# Patient Record
Sex: Male | Born: 1940
Health system: Southern US, Community
[De-identification: ages and names within clinical notes are randomized; demographics above are authoritative.]

## PROBLEM LIST (undated history)

## (undated) DIAGNOSIS — I499 Cardiac arrhythmia, unspecified: Secondary | ICD-10-CM

## (undated) DIAGNOSIS — I219 Acute myocardial infarction, unspecified: Secondary | ICD-10-CM

## (undated) DIAGNOSIS — N281 Cyst of kidney, acquired: Secondary | ICD-10-CM

## (undated) DIAGNOSIS — M1990 Unspecified osteoarthritis, unspecified site: Secondary | ICD-10-CM

## (undated) DIAGNOSIS — I4891 Unspecified atrial fibrillation: Secondary | ICD-10-CM

## (undated) DIAGNOSIS — J189 Pneumonia, unspecified organism: Secondary | ICD-10-CM

## (undated) DIAGNOSIS — M4800 Spinal stenosis, site unspecified: Secondary | ICD-10-CM

## (undated) DIAGNOSIS — I2510 Atherosclerotic heart disease of native coronary artery without angina pectoris: Secondary | ICD-10-CM

## (undated) DIAGNOSIS — N451 Epididymitis: Secondary | ICD-10-CM

## (undated) DIAGNOSIS — T508X5A Adverse effect of diagnostic agents, initial encounter: Secondary | ICD-10-CM

## (undated) DIAGNOSIS — Z9289 Personal history of other medical treatment: Secondary | ICD-10-CM

## (undated) DIAGNOSIS — C689 Malignant neoplasm of urinary organ, unspecified: Secondary | ICD-10-CM

## (undated) DIAGNOSIS — N141 Nephropathy induced by other drugs, medicaments and biological substances: Secondary | ICD-10-CM

## (undated) DIAGNOSIS — N1411 Contrast-induced nephropathy: Secondary | ICD-10-CM

## (undated) DIAGNOSIS — E785 Hyperlipidemia, unspecified: Secondary | ICD-10-CM

## (undated) HISTORY — DX: Nephropathy induced by other drugs, medicaments and biological substances: N14.1

## (undated) HISTORY — PX: JOINT REPLACEMENT: SHX530

## (undated) HISTORY — DX: Spinal stenosis, site unspecified: M48.00

## (undated) HISTORY — DX: Atherosclerotic heart disease of native coronary artery without angina pectoris: I25.10

## (undated) HISTORY — PX: OTHER SURGICAL HISTORY: SHX169

## (undated) HISTORY — DX: Contrast-induced nephropathy: N14.11

## (undated) HISTORY — DX: Adverse effect of diagnostic agents, initial encounter: T50.8X5A

## (undated) HISTORY — PX: URETER SURGERY: SHX823

## (undated) HISTORY — DX: Malignant neoplasm of urinary organ, unspecified: C68.9

## (undated) HISTORY — DX: Unspecified atrial fibrillation: I48.91

## (undated) HISTORY — DX: Hyperlipidemia, unspecified: E78.5

## (undated) HISTORY — DX: Cyst of kidney, acquired: N28.1

---

## 2006-11-15 DIAGNOSIS — I219 Acute myocardial infarction, unspecified: Secondary | ICD-10-CM

## 2006-11-15 HISTORY — DX: Acute myocardial infarction, unspecified: I21.9

## 2006-11-15 HISTORY — PX: CARDIAC CATHETERIZATION: SHX172

## 2006-11-28 DIAGNOSIS — M5430 Sciatica, unspecified side: Secondary | ICD-10-CM | POA: Insufficient documentation

## 2006-12-05 DIAGNOSIS — I10 Essential (primary) hypertension: Secondary | ICD-10-CM | POA: Insufficient documentation

## 2006-12-05 DIAGNOSIS — Z125 Encounter for screening for malignant neoplasm of prostate: Secondary | ICD-10-CM | POA: Insufficient documentation

## 2007-03-02 ENCOUNTER — Ambulatory Visit: Payer: Self-pay | Admitting: Internal Medicine

## 2007-03-02 ENCOUNTER — Ambulatory Visit: Payer: Self-pay | Admitting: Cardiology

## 2007-03-02 ENCOUNTER — Inpatient Hospital Stay (HOSPITAL_COMMUNITY): Admission: EM | Admit: 2007-03-02 | Discharge: 2007-03-17 | Payer: Self-pay | Admitting: Emergency Medicine

## 2007-03-02 DIAGNOSIS — I2119 ST elevation (STEMI) myocardial infarction involving other coronary artery of inferior wall: Secondary | ICD-10-CM

## 2007-03-02 HISTORY — DX: ST elevation (STEMI) myocardial infarction involving other coronary artery of inferior wall: I21.19

## 2007-03-03 ENCOUNTER — Encounter: Payer: Self-pay | Admitting: Cardiology

## 2007-03-05 ENCOUNTER — Encounter (INDEPENDENT_AMBULATORY_CARE_PROVIDER_SITE_OTHER): Payer: Self-pay | Admitting: Specialist

## 2007-03-23 ENCOUNTER — Ambulatory Visit: Payer: Self-pay | Admitting: Cardiovascular Disease

## 2007-03-27 ENCOUNTER — Ambulatory Visit: Payer: Self-pay | Admitting: Internal Medicine

## 2007-04-03 ENCOUNTER — Ambulatory Visit: Payer: Self-pay | Admitting: Cardiology

## 2007-04-11 ENCOUNTER — Ambulatory Visit: Payer: Self-pay | Admitting: Cardiovascular Disease

## 2007-04-11 LAB — CONVERTED CEMR LAB
CO2: 27 meq/L (ref 19–32)
Chloride: 107 meq/L (ref 96–112)
Creatinine, Ser: 1.7 mg/dL — ABNORMAL HIGH (ref 0.4–1.5)
Glucose, Bld: 122 mg/dL — ABNORMAL HIGH (ref 70–99)
Sodium: 144 meq/L (ref 135–145)

## 2007-04-12 ENCOUNTER — Ambulatory Visit: Payer: Self-pay | Admitting: Cardiology

## 2007-04-13 ENCOUNTER — Ambulatory Visit: Payer: Self-pay | Admitting: Cardiology

## 2007-04-19 ENCOUNTER — Ambulatory Visit: Payer: Self-pay | Admitting: Cardiology

## 2007-04-19 LAB — CONVERTED CEMR LAB
BUN: 23 mg/dL (ref 6–23)
CO2: 30 meq/L (ref 19–32)
Calcium: 9.3 mg/dL (ref 8.4–10.5)
Creatinine, Ser: 1.4 mg/dL (ref 0.4–1.5)
GFR calc non Af Amer: 54 mL/min

## 2007-05-02 ENCOUNTER — Ambulatory Visit: Payer: Self-pay | Admitting: Cardiovascular Disease

## 2007-05-30 ENCOUNTER — Ambulatory Visit: Payer: Self-pay

## 2007-05-30 ENCOUNTER — Encounter: Payer: Self-pay | Admitting: Cardiovascular Disease

## 2007-05-30 ENCOUNTER — Ambulatory Visit: Payer: Self-pay | Admitting: Cardiovascular Disease

## 2007-05-30 LAB — CONVERTED CEMR LAB
ALT: 25 units/L (ref 0–53)
Bilirubin, Direct: 0.1 mg/dL (ref 0.0–0.3)
Cholesterol: 107 mg/dL (ref 0–200)
HDL: 24.1 mg/dL — ABNORMAL LOW (ref >39.0)
LDL Cholesterol: 51 mg/dL (ref 0–99)
Total Bilirubin: 0.9 mg/dL (ref 0.3–1.2)
Total CHOL/HDL Ratio: 4.4
Total Protein: 7 g/dL (ref 6.0–8.3)

## 2007-08-02 ENCOUNTER — Ambulatory Visit: Payer: Self-pay | Admitting: Cardiovascular Disease

## 2007-11-20 ENCOUNTER — Ambulatory Visit: Payer: Self-pay | Admitting: Cardiovascular Disease

## 2007-11-21 DIAGNOSIS — N400 Enlarged prostate without lower urinary tract symptoms: Secondary | ICD-10-CM | POA: Insufficient documentation

## 2007-11-21 DIAGNOSIS — I2510 Atherosclerotic heart disease of native coronary artery without angina pectoris: Secondary | ICD-10-CM | POA: Insufficient documentation

## 2008-02-27 ENCOUNTER — Ambulatory Visit: Payer: Self-pay | Admitting: Cardiovascular Disease

## 2008-02-27 LAB — CONVERTED CEMR LAB
Chloride: 108 meq/L (ref 96–112)
Creatinine, Ser: 1.3 mg/dL (ref 0.4–1.5)
GFR calc Af Amer: 71 mL/min
GFR calc non Af Amer: 58 mL/min

## 2008-06-03 ENCOUNTER — Ambulatory Visit: Payer: Self-pay | Admitting: Cardiovascular Disease

## 2008-06-05 ENCOUNTER — Ambulatory Visit: Payer: Self-pay | Admitting: Cardiovascular Disease

## 2008-06-05 LAB — CONVERTED CEMR LAB
ALT: 16 units/L (ref 0–53)
AST: 17 units/L (ref 0–37)
Albumin: 4 g/dL (ref 3.5–5.2)
Alkaline Phosphatase: 39 units/L (ref 39–117)
BUN: 24 mg/dL — ABNORMAL HIGH (ref 6–23)
Bilirubin, Direct: 0.1 mg/dL (ref 0.0–0.3)
CO2: 29 meq/L (ref 19–32)
Chloride: 108 meq/L (ref 96–112)
Cholesterol: 119 mg/dL (ref 0–200)
GFR calc Af Amer: 71 mL/min
Glucose, Bld: 112 mg/dL — ABNORMAL HIGH (ref 70–99)
Potassium: 4.1 meq/L (ref 3.5–5.1)
Sodium: 143 meq/L (ref 135–145)
Total Protein: 6.9 g/dL (ref 6.0–8.3)
VLDL: 23 mg/dL (ref 0–40)

## 2008-07-15 ENCOUNTER — Encounter (INDEPENDENT_AMBULATORY_CARE_PROVIDER_SITE_OTHER): Payer: Self-pay | Admitting: Urology

## 2008-07-15 ENCOUNTER — Ambulatory Visit (HOSPITAL_COMMUNITY): Admission: RE | Admit: 2008-07-15 | Discharge: 2008-07-15 | Payer: Self-pay | Admitting: Urology

## 2008-07-15 HISTORY — PX: CYSTECTOMY: SUR359

## 2008-07-17 ENCOUNTER — Encounter: Admission: RE | Admit: 2008-07-17 | Discharge: 2008-07-17 | Payer: Self-pay | Admitting: Interventional Radiology

## 2008-10-05 DIAGNOSIS — E782 Mixed hyperlipidemia: Secondary | ICD-10-CM

## 2008-10-05 DIAGNOSIS — I495 Sick sinus syndrome: Secondary | ICD-10-CM | POA: Insufficient documentation

## 2008-10-05 DIAGNOSIS — I4891 Unspecified atrial fibrillation: Secondary | ICD-10-CM | POA: Insufficient documentation

## 2008-10-05 DIAGNOSIS — N183 Chronic kidney disease, stage 3 unspecified: Secondary | ICD-10-CM | POA: Insufficient documentation

## 2008-10-05 DIAGNOSIS — I2510 Atherosclerotic heart disease of native coronary artery without angina pectoris: Secondary | ICD-10-CM | POA: Insufficient documentation

## 2008-12-05 ENCOUNTER — Ambulatory Visit: Payer: Self-pay

## 2008-12-12 ENCOUNTER — Encounter: Payer: Self-pay | Admitting: Cardiovascular Disease

## 2008-12-12 ENCOUNTER — Ambulatory Visit: Payer: Self-pay | Admitting: Cardiovascular Disease

## 2009-03-20 ENCOUNTER — Encounter: Payer: Self-pay | Admitting: Cardiovascular Disease

## 2009-07-18 ENCOUNTER — Ambulatory Visit: Payer: Self-pay | Admitting: Cardiovascular Disease

## 2009-07-22 ENCOUNTER — Ambulatory Visit: Payer: Self-pay | Admitting: Cardiovascular Disease

## 2009-07-24 ENCOUNTER — Telehealth (INDEPENDENT_AMBULATORY_CARE_PROVIDER_SITE_OTHER): Payer: Self-pay | Admitting: *Deleted

## 2009-07-24 LAB — CONVERTED CEMR LAB
ALT: 21 units/L (ref 0–53)
AST: 21 units/L (ref 0–37)
Alkaline Phosphatase: 45 units/L (ref 39–117)
Bilirubin, Direct: 0.1 mg/dL (ref 0.0–0.3)
Total Bilirubin: 0.9 mg/dL (ref 0.3–1.2)
Total CHOL/HDL Ratio: 4

## 2009-08-18 ENCOUNTER — Encounter (INDEPENDENT_AMBULATORY_CARE_PROVIDER_SITE_OTHER): Payer: Self-pay

## 2009-08-18 ENCOUNTER — Telehealth: Payer: Self-pay | Admitting: Cardiovascular Disease

## 2010-01-27 ENCOUNTER — Ambulatory Visit: Payer: Self-pay | Admitting: Cardiovascular Disease

## 2010-01-28 LAB — CONVERTED CEMR LAB
ALT: 20 U/L (ref 0–53)
AST: 20 U/L (ref 0–37)
Albumin: 4.2 g/dL (ref 3.5–5.2)
Alkaline Phosphatase: 54 U/L (ref 39–117)
BUN: 17 mg/dL (ref 6–23)
Bilirubin, Direct: 0.1 mg/dL (ref 0.0–0.3)
CO2: 31 meq/L (ref 19–32)
Calcium: 8.9 mg/dL (ref 8.4–10.5)
Chloride: 106 meq/L (ref 96–112)
Cholesterol: 109 mg/dL (ref 0–200)
Creatinine, Ser: 1.4 mg/dL (ref 0.4–1.5)
GFR calc non Af Amer: 53.19 mL/min (ref >60)
Glucose, Bld: 95 mg/dL (ref 70–99)
HDL: 37.6 mg/dL — ABNORMAL LOW (ref >39.00)
LDL Cholesterol: 48 mg/dL (ref 0–99)
Potassium: 4.2 meq/L (ref 3.5–5.1)
Sodium: 142 meq/L (ref 135–145)
Total Bilirubin: 0.9 mg/dL (ref 0.3–1.2)
Total CHOL/HDL Ratio: 3
Total Protein: 7.3 g/dL (ref 6.0–8.3)
Triglycerides: 115 mg/dL (ref 0.0–149.0)
VLDL: 23 mg/dL (ref 0.0–40.0)

## 2010-02-11 ENCOUNTER — Ambulatory Visit: Payer: Self-pay | Admitting: Internal Medicine

## 2010-02-11 ENCOUNTER — Ambulatory Visit: Payer: Self-pay

## 2010-02-11 ENCOUNTER — Ambulatory Visit (HOSPITAL_COMMUNITY): Admission: RE | Admit: 2010-02-11 | Discharge: 2010-02-11 | Payer: Self-pay | Admitting: Cardiovascular Disease

## 2010-02-11 ENCOUNTER — Encounter: Payer: Self-pay | Admitting: Cardiovascular Disease

## 2010-08-20 ENCOUNTER — Ambulatory Visit: Payer: Self-pay | Admitting: Cardiovascular Disease

## 2010-08-21 ENCOUNTER — Telehealth (INDEPENDENT_AMBULATORY_CARE_PROVIDER_SITE_OTHER): Payer: Self-pay

## 2010-12-06 ENCOUNTER — Encounter: Payer: Self-pay | Admitting: Urology

## 2010-12-15 NOTE — Progress Notes (Signed)
Summary: has decided not to take pradaxa  Phone Note Call from Patient   Caller: Patient Summary of Call:  pt got an rx for pradaxa yesterday, has deceided not to take it, the bottle will be $75 a month Initial call taken by: Glynda Jaeger,  August 21, 2010 3:15 PM  Follow-up for Phone Call        Pt found out of pocket expense would be $73.00/month.  He states he also did not like the side effect possibilities.  He continues to take Aspirin 325mg  once daily. I will forward to Dr. Excell Seltzer & Leotis Shames. Mylo Red RN

## 2010-12-15 NOTE — Assessment & Plan Note (Signed)
Summary: ROV   Visit Type:  Follow-up Primary Provider:  Herb Grays  CC:  No complaints.  History of Present Illness: the patient is a 70 year old gentleman with coronary artery disease and chronic atrial fibrillation. he underwent PCI in 2008 with drug-eluting stents in the LAD when he presented with an acute infarction. He has been asymptomatic from a cardiac standpoint. The patient denies chest pain, dyspnea, orthopnea, PND, edema, palpitations, lightheadedness, or syncope.  He continues to exercise regularly and feels well. He has no exertional symptoms. He has lost a great deal of weight since I have been following and he follows a strict diet.     Current Medications (verified): 1)  Carvedilol 6.25 Mg Tabs (Carvedilol) .... Take One Tablet By Mouth Twice A Day 2)  Simvastatin 40 Mg Tabs (Simvastatin) .... Take One Tablet By Mouth Daily At Bedtime 3)  Furosemide 40 Mg Tabs (Furosemide) .... Take One Tablet By Mouth Daily 4)  Calcitriol 0.25 Mcg Caps (Calcitriol) .... Take 1 Capsule By Mouth Once A Day 5)  Nitroglycerin 0.4 Mg Subl (Nitroglycerin) .... One Tablet Under Tongue Every 5 Minutes As Needed For Chest Pain---May Repeat Times Three 6)  Centrum Silver  Tabs (Multiple Vitamins-Minerals) .... Take 1 Tablet By Mouth Once A Day 7)  Aspirin Ec 325 Mg Tbec (Aspirin) .... Take One Tablet By Mouth Daily  Allergies (verified): No Known Drug Allergies  Past History:  Past medical history reviewed for relevance to current acute and chronic problems.  Past Medical History: Reviewed history from 10/05/2008 and no changes required. 1. CAD s/p NSTEMI 04/08 2. s/p LAD PCI, drug-eluting stent 04/08 3. CKD, stage II. Hx ARF secondary to contrast nephropathy 4. Large renal cyst 5. Chronic atrial fibrillation 6. Dyslipidemia  Review of Systems       Negative except as per HPI   Vital Signs:  Patient profile:   70 year old male Height:      72 inches Weight:      199  pounds BMI:     27.09 Pulse rate:   46 / minute Pulse rhythm:   regular Resp:     18 per minute BP sitting:   106 / 63  (left arm) Cuff size:   large  Vitals Entered By: Vikki Ports (August 20, 2010 10:06 AM)  Physical Exam  General:  Pt is alert and oriented, in no acute distress.   HEENT: normal Neck: normal carotid upstrokes without bruits, JVP normal Lungs: CTA CV: irregularly irregular without murmur or gallop, bradycardic Abd: soft, NT, positive BS, no bruit, no organomegaly Ext: no clubbing, cyanosis, or edema. peripheral pulses 2+ and equal Skin: warm and dry without rash    EKG  Procedure date:  08/20/2010  Findings:      Atrial fib HR 46 bpm, otherwise within normal limits  Echocardiogram  Procedure date:  02/11/2010  Findings:      Study Conclusions            - Left ventricle: Wall thickness was increased in a pattern of mild       LVH. Systolic function was normal. The estimated ejection fraction       was in the range of 55% to 60%. Wall motion was normal; there were       no regional wall motion abnormalities.     - Aortic valve: Mild regurgitation.     - Mitral valve: Mild regurgitation.     - Left atrium: The atrium was severely  dilated.     - Right atrium: The atrium was severely dilated.  Impression & Recommendations:  Problem # 1:  ATRIAL FIBRILLATION (ICD-427.31) Pt is doing very well and has made remarkable strides with respect to weight loss and fitness. His HR is low today and while he is asymptomatic, I have recommended reducing his carvedilol to 3.125 mg twice daily. I reviewed his echo findings from this Spring, and he does have severe biatrial enlargement. While his CHADS score is low, I think his thromboembolic risk is significant with the presence of CAD, age over 88, and severe atrial enlargement. He has not responded well to warfarin in the past with very difficult to control INR's. We discussed the advantages of Pradaxa and he is  willing to try this...as long as the cost is reasonable.  His updated medication list for this problem includes:    Carvedilol 3.125 Mg Tabs (Carvedilol) .Marland Kitchen... Take one tablet by mouth twice a day    Aspirin Ec 325 Mg Tbec (Aspirin) .Marland Kitchen... Take one tablet by mouth daily  Problem # 2:  Hosp for CAD, NATIVE VESSEL (ICD-414.01) Stable without angina. He will reduce ASA to 81 mg if he decides to strrt Pradaxa.  His updated medication list for this problem includes:    Carvedilol 3.125 Mg Tabs (Carvedilol) .Marland Kitchen... Take one tablet by mouth twice a day    Nitroglycerin 0.4 Mg Subl (Nitroglycerin) ..... One tablet under tongue every 5 minutes as needed for chest pain---may repeat times three    Aspirin Ec 325 Mg Tbec (Aspirin) .Marland Kitchen... Take one tablet by mouth daily  Problem # 3:  MIXED HYPERLIPIDEMIA (ICD-272.2) Lipids at goal.  His updated medication list for this problem includes:    Simvastatin 40 Mg Tabs (Simvastatin) .Marland Kitchen... Take one tablet by mouth daily at bedtime  CHOL: 109 (01/27/2010)   LDL: 48 (01/27/2010)   HDL: 37.60 (01/27/2010)   TG: 115.0 (01/27/2010)  Patient Instructions: 1)  Your physician has recommended you make the following change in your medication: DECREASE Carvedilol to 3.125mg  two times a day. I will send in prescription for Pradaxa 150mg  two times a day (please check on the cost of this medication).  Please call the office  to let us know if you do or do not start Pradaxa. You will need to arrange labs within 10 days of starting medication.  If you start Pradaxa please decrease your Aspirin dose to 81mg  once a day.   2)  Your physician wants you to follow-up in:  6 MONTHS.  You will receive a reminder letter in the mail two months in advance. If you don't receive a letter, please call our office to schedule the follow-up appointment. Prescriptions: PRADAXA 150 MG CAPS (DABIGATRAN ETEXILATE MESYLATE) take one capsule by mouth two times a day  #60 x 8   Entered by:   Julieta Gutting, RN, BSN   Authorized by:   Norva Karvonen, MD   Signed by:   Julieta Gutting, RN, BSN on 08/20/2010   Method used:   Electronically to        [facility]  [address] (retail)       [address]       [place], [place]  [address]       Ph: [phone]       Fax: [phone]   RxID:   [id number] CARVEDILOL 3.125 MG TABS (CARVEDILOL) Take one tablet by mouth twice a  day  #60 x 8   Entered by:   Julieta Gutting, RN, BSN   Authorized by:   Norva Karvonen, MD   Signed by:   Julieta Gutting, RN, BSN on 08/20/2010   Method used:   Electronically to        [facility]  [address] (retail)       [address]       [place], [place]  [address]       Ph: [phone]       Fax: [phone]   RxID:   [id number]

## 2010-12-15 NOTE — Assessment & Plan Note (Signed)
Summary: f1y   Visit Type:  6 months follow up Primary Provider:  Herb Grays  CC:  No complaints.  History of Present Illness: the patient is a 70 year old gentleman with coronary artery disease and chronic atrial fibrillation. he underwent PCI in 2008 with drug-eluting stents in the LAD when he presented with an acute infarction. He has been asymptomatic from a cardiac standpoint. The patient denies chest pain, dyspnea, orthopnea, PND, edema, palpitations, lightheadedness, or syncope.  He feels well and exercises regularly. He has lost 50 pounds since 2009 through diet and exercise. He has declined coumadin for his atrial fib so he is treated with aspirin.     Current Medications (verified): 1)  Carvedilol 6.25 Mg Tabs (Carvedilol) .... Take One Tablet By Mouth Twice A Day 2)  Simvastatin 80 Mg Tabs (Simvastatin) .... Once Daily 3)  Furosemide 40 Mg Tabs (Furosemide) .... Twice Daily 4)  Calcitriol 0.25 Mcg Caps (Calcitriol) .... Take 1 Capsule By Mouth Once A Day 5)  Nitroglycerin 0.4 Mg Subl (Nitroglycerin) .... One Tablet Under Tongue Every 5 Minutes As Needed For Chest Pain---May Repeat Times Three 6)  Centrum Silver  Tabs (Multiple Vitamins-Minerals) .... Take 1 Tablet By Mouth Once A Day 7)  Fish Oil 300 Mg Caps (Omega-3 Fatty Acids) .... Take 1 Capsule By Mouth Once A Day 8)  Aspirin Ec 325 Mg Tbec (Aspirin) .... Take One Tablet By Mouth Daily  Allergies (verified): No Known Drug Allergies  Past History:  Past medical history reviewed for relevance to current acute and chronic problems.  Past Medical History: Reviewed history from 10/05/2008 and no changes required. 1. CAD s/p NSTEMI 04/08 2. s/p LAD PCI, drug-eluting stent 04/08 3. CKD, stage II. Hx ARF secondary to contrast nephropathy 4. Large renal cyst 5. Chronic atrial fibrillation 6. Dyslipidemia  Review of Systems       Negative except as per HPI   Vital Signs:  Patient profile:   71 year old  male Height:      72 inches Weight:      204 pounds BMI:     27.77 Pulse rate:   60 / minute Pulse rhythm:   irregular Resp:     18 per minute BP sitting:   110 / 72  (left arm) Cuff size:   large  Vitals Entered By: Vikki Ports (January 27, 2010 8:39 AM)  Physical Exam  General:  Pt is alert and oriented, in no acute distress.  Obvious weight loss from past exams. HEENT: normal Neck: normal carotid upstrokes without bruits, JVP normal Lungs: CTA CV: irregularly irregular without murmur or gallop Abd: soft, NT, positive BS, no bruit, no organomegaly Ext: no clubbing, cyanosis, or edema. peripheral pulses 2+ and equal Skin: warm and dry without rash    EKG  Procedure date:  01/27/2010  Findings:      Atrial fibrillation, hr 60 bpm, otherwise within normal limits.  Impression & Recommendations:  Problem # 1:  ATRIAL FIBRILLATION (ICD-427.31) Stable, asymptomatic. CHADs = 0. Continue ASA 325 mg daily.  His updated medication list for this problem includes:    Carvedilol 6.25 Mg Tabs (Carvedilol) .Marland Kitchen... Take one tablet by mouth twice a day    Aspirin Ec 325 Mg Tbec (Aspirin) .Marland Kitchen... Take one tablet by mouth daily  Orders: EKG w/ Interpretation (93000) TLB-BMP (Basic Metabolic Panel-BMET) (80048-METABOL) TLB-Lipid Panel (80061-LIPID) TLB-Hepatic/Liver Function Pnl (80076-HEPATIC) Echocardiogram (Echo)  Problem # 2:  Hosp for CAD, NATIVE VESSEL (ICD-414.01) Pt is stable without angina.  He is on ASA for antiplatelet Rx. Treated with coreg for hx of LV dysfunction. LVEF > 55% by last echo a few years ago. Repeat echo to reassess LV fxn. Stress Myoview 2010 negative for ischemia. Discussed Pegasus trial with pt but he declined.  His updated medication list for this problem includes:    Carvedilol 6.25 Mg Tabs (Carvedilol) .Marland Kitchen... Take one tablet by mouth twice a day    Nitroglycerin 0.4 Mg Subl (Nitroglycerin) ..... One tablet under tongue every 5 minutes as needed for chest  pain---may repeat times three    Aspirin Ec 325 Mg Tbec (Aspirin) .Marland Kitchen... Take one tablet by mouth daily  Orders: EKG w/ Interpretation (93000) TLB-BMP (Basic Metabolic Panel-BMET) (80048-METABOL) TLB-Lipid Panel (80061-LIPID) TLB-Hepatic/Liver Function Pnl (80076-HEPATIC) Echocardiogram (Echo)  Problem # 3:  MIXED HYPERLIPIDEMIA (ICD-272.2)  Check lipids and lft's today. If LDL remains low, will decrease simvastatin dose to 40 mg.  His updated medication list for this problem includes:    Simvastatin 80 Mg Tabs (Simvastatin) ..... Once daily  Orders: EKG w/ Interpretation (93000) TLB-BMP (Basic Metabolic Panel-BMET) (80048-METABOL) TLB-Lipid Panel (80061-LIPID) TLB-Hepatic/Liver Function Pnl (80076-HEPATIC) Echocardiogram (Echo)  CHOL: 103 (07/22/2009)   LDL: 57 (07/22/2009)   HDL: 27.80 (07/22/2009)   TG: 90.0 (07/22/2009)  Patient Instructions: 1)  Your physician recommends that you have a FASTING LIPID, LIVER and BMP today.  2)  Your physician wants you to follow-up in: 6 MONTHS.  You will receive a reminder letter in the mail two months in advance. If you don't receive a letter, please call our office to schedule the follow-up appointment. 3)  Your physician has requested that you have an echocardiogram.  Echocardiography is a painless test that uses sound waves to create images of your heart. It provides your doctor with information about the size and shape of your heart and how well your heart's chambers and valves are working.  This procedure takes approximately one hour. There are no restrictions for this procedure. 4)  Your physician recommends that you continue on your current medications as directed. Please refer to the Current Medication list given to you today.

## 2011-03-10 ENCOUNTER — Encounter: Payer: Self-pay | Admitting: Cardiovascular Disease

## 2011-03-11 ENCOUNTER — Ambulatory Visit (INDEPENDENT_AMBULATORY_CARE_PROVIDER_SITE_OTHER): Payer: Medicare Other | Admitting: Cardiovascular Disease

## 2011-03-11 ENCOUNTER — Encounter: Payer: Self-pay | Admitting: Cardiovascular Disease

## 2011-03-11 VITALS — BP 118/72 | HR 50 | Ht 72.0 in | Wt 206.8 lb

## 2011-03-11 DIAGNOSIS — I4891 Unspecified atrial fibrillation: Secondary | ICD-10-CM

## 2011-03-11 DIAGNOSIS — I2510 Atherosclerotic heart disease of native coronary artery without angina pectoris: Secondary | ICD-10-CM

## 2011-03-11 DIAGNOSIS — E782 Mixed hyperlipidemia: Secondary | ICD-10-CM

## 2011-03-11 NOTE — Assessment & Plan Note (Signed)
The patient is stable without anginal symptoms. I have recommended discontinuation of Coreg because of his slow ventricular rate in the setting of atrial fibrillation. His blood pressure has also remained in the low normal range and I think he will tolerate stopping his beta blocker just fine. He will continue on antiplatelet therapy with aspirin. No other medication changes were made today

## 2011-03-11 NOTE — Progress Notes (Signed)
HPI:  This is a 70 year old gentleman presenting for followup of atrial fibrillation and coronary artery disease. He presented with non-ST elevation infarction in 2000 and was treated with drug-eluting stents in his LAD. His hospital course was complicated by atrial fibrillation and renal failure. The patient has permanent atrial fibrillation and is managed with aspirin alone. He was initially treated with warfarin but had extreme sensitivity and had INRs of greater than 10 on 2 separate occasions. The last time I saw him I started him on pradaxa but he did not fill the prescription because of concerns over side effects and cost of medication.  The patient's left ventricular function is normal. He has marked biatrial enlargement, but no other evidence of structural heart disease. His renal function has also returned to normal.  He feels well and continues to exercise regularly. He denies exertional chest pain or dyspnea. He denies edema. He has no other complaints at this time.  Outpatient Encounter Prescriptions as of 03/11/2011  Medication Sig Dispense Refill  . aspirin 325 MG tablet Take 325 mg by mouth daily.        Marland Kitchen atorvastatin (LIPITOR) 40 MG tablet Take 40 mg by mouth daily.        . calcitRIOL (ROCALTROL) 0.25 MCG capsule Take 0.25 mcg by mouth daily.        . fish oil-omega-3 fatty acids 1000 MG capsule Take 2 g by mouth daily.        . furosemide (LASIX) 40 MG tablet Take 40 mg by mouth daily.        . Inositol Niacinate (NIACIN FLUSH FREE) 500 MG CAPS Take 1 capsule by mouth daily.        . Multiple Vitamins-Minerals (CENTRUM SILVER PO) Take 1 tablet by mouth daily.        . nitroGLYCERIN (NITROSTAT) 0.4 MG SL tablet Place 0.4 mg under the tongue every 5 (five) minutes as needed.        Marland Kitchen DISCONTD: carvedilol (COREG) 3.125 MG tablet Take 3.125 mg by mouth 2 (two) times daily with a meal.        . DISCONTD: dabigatran (PRADAXA) 150 MG CAPS Take 150 mg by mouth every 12 (twelve) hours.         Marland Kitchen DISCONTD: simvastatin (ZOCOR) 40 MG tablet Take 40 mg by mouth at bedtime.          No Known Allergies  Past Medical History  Diagnosis Date  . CAD (coronary artery disease)      s/p NSTEMI 04/08, s/p LAD PCI, drug-eluting stent 04/08  . CKD (chronic kidney disease)     Hx ARF secondary to contrast nephropathy  . Atrial fibrillation   . Renal cyst   . Dyslipidemia     ROS: Negative except as per HPI  BP 118/72  Pulse 50  Ht 6' (1.829 m)  Wt 206 lb 12.8 oz (93.804 kg)  BMI 28.05 kg/m2  PHYSICAL EXAM: Pt is alert and oriented, NAD HEENT: normal Neck: JVP - normal, carotids 2+= without bruits Lungs: CTA bilaterally CV: irregularly irregular without murmur or gallop Abd: soft, NT, Positive BS, no hepatomegaly Ext: no C/C/E, distal pulses intact and equal Skin: warm/dry no rash  EKG:  Atrial fibrillation with slow ventricular response, heart rate 50 beats per minute.  ASSESSMENT AND PLAN:

## 2011-03-11 NOTE — Assessment & Plan Note (Signed)
The patient's atrial fibrillation is stable. We discussed his overall thromboembolic risk.  I have recommended anticoagulation because of his marked biatrial enlargement. The patient declines this and would like to remain on aspirin.his beta blocker was stopped because of bradycardia.

## 2011-03-11 NOTE — Patient Instructions (Signed)
Your physician wants you to follow-up in: 6 MONTHS.  You will receive a reminder letter in the mail two months in advance. If you don't receive a letter, please call our office to schedule the follow-up appointment.  Your physician has recommended you make the following change in your medication: STOP Carvedilol

## 2011-03-11 NOTE — Assessment & Plan Note (Signed)
Lipids are followed by Dr. Collins [place]. He is on multidrug therapy with atorvastatin, fish oil, and niacin.

## 2011-03-22 ENCOUNTER — Encounter: Payer: Self-pay | Admitting: Cardiovascular Disease

## 2011-03-30 NOTE — Assessment & Plan Note (Signed)
[facility]                            CARDIOLOGY OFFICE NOTE   NAME:[name], Stacks                           MRN:          [id number]  DATE:03/23/2007                            DOB:          09/03/1941    [name] [name] presents as an outpatient to the [facility] Cardiology office on  Mar 23, 2007 to followup after his recent hospitalization at [facility]  from April 17 through May 2. He initially presented with a non-ST  elevation myocardial infarction that ultimately involved his anterior  wall. He presented with a prolonged episode of chest discomfort with  shortness of breath and diaphoresis. He was taken emergently to the  cardiac cath lab on the day of admission. His catheterization  demonstrated high-grade stenosis in the LAD. He ultimately required  three drug-eluting stents in his LAD, two of which were overlapped. He  required a third stent in the proximal LAD due to a 75% stenosis in that  region. At the conclusion of the procedure, [name] [name] had TIMI 3 flow in  the vessel, but the procedure was complicated by no re-flow. The no re-  flow improved after further stenting and intracoronary adenosine.   Unfortunately, he developed complications of both acute renal failure  and severe thrombocytopenia after his intervention. The main problem  during his hospitalization was that of acute renal failure and his  creatinine climbed all the way to the range of 9. He required 2-3  sessions of hemodialysis and then began making urine on his own. His  thrombocytopenia was thought to be related to 2B3A inhibitor use. This  resolved spontaneously after several days, but he did develop  concomitant anemia and required blood transfusion despite no obvious  source of bleeding. In addition to these problems, [name] [name] had  paroxysmal atrial fibrillation during his hospitalization. At the time  of discharge, he had been in sinus rhythm for at least a few days. He  was therefore not discharged on warfarin as his atrial fibrillation was  thought to be related to his acute myocardial infarction and other  complications. He also was diagnosed with a very large left renal cyst  that is 23 cm in diameter. There was some discussion of percutaneous  drainage of the cyst, but in the setting of his multiple other problems,  we elected to defer on this until he recovers from everything else.   Since discharge home, [name] [name] feels much better. He is urinating  frequently. He has lost a great deal of weight. He denies chest pain or  dyspnea. He complains of some fatigue, but overall he is much improved.  He has had no palpitations, lightheadedness, syncope or other  complaints.   His most recent lab work from the [facility] showed a  creatinine of 4.9 with a BUN of 67.   CURRENT MEDICATIONS:  1. Aspirin 81 mg daily.  2. Plavix 75 mg daily.  3. Nephrovite daily.  4. Coreg 12.5 mg twice daily.  5. Lasix 80 mg daily.  6. Norvasc 5 mg daily.  7. Lipitor  80 mg daily.   ALLERGIES:  No known drug allergies.   PHYSICAL EXAMINATION:  [name] [name] is alert and oriented. He is in no  acute distress. His weight is 246 pounds. Blood pressure is 130/70 in  the right arm and 130/70 in the left arm. Heart rate 70, respiratory  rate is 16.  HEENT: Is normal.  NECK: Normal carotid upstrokes without bruits. Jugular venous pressure  is normal.  LUNGS:  Clear to auscultation bilaterally.  CARDIOVASCULAR: Heart is irregular without murmurs or gallops.  ABDOMEN: Soft and nontender. Obese. No organomegaly.  EXTREMITIES: There is trace bilateral pre-tibial edema. Otherwise,  normal. Peripheral pulses are intact and equal.   EKG: Demonstrates atrial fibrillation with controlled ventricular  response with a ventricular rate of 70 beats per minute. There are  marked anterolateral T-wave changes consistent with ischemia.   ASSESSMENT:  [name] [name] is recovering well  from his prolonged  hospitalization after his complicated myocardial infarction. His current  cardiac related problems are as follows:  1. Anterolateral myocardial infarction. He was treated with three drug-      eluting stents in his left anterior descending artery (LAD). His      post myocardial infarction left ventricular function as assessed by      echo is in the moderate range with an LVEF estimated at 35-40%.      There was akinesis of the peri-apical wall and hypokinesis of the      anteroseptum. We will plan on reassessment of his left ventricular      function by echo in approximately three months time. He is on a      good medical regimen, receiving dual anti-platelet therapy with      aspirin and Plavix as well as Coreg, Norvasc and high dose statin      therapy. He is not on an ACE inhibitor because of his renal      failure.  2. Acute renal failure. His creatinine is improving. He will continue      with regular metabolic panels at [facility] and he is being      managed by Dr.  Darrick Penna. He has been following a renal diet and      continues on daily Lasix. I am hoping that his kidney function will      return to near normal range.  3. Atrial fibrillation. Unfortunately, [name] [name] is back in atrial      fibrillation. In the setting of his recent myocardial infarction      with left ventricular dysfunction and a concomitant risk factor of      hypertension, his stroke risk warrants chronic anti-coagulation      with warfarin. I have asked him to start daily Coumadin and he will      be seen in our Coumadin Clinic to establish there. His bleeding      risk is also significant on aspirin, Plavix and Coumadin, but I      think therapy is clearly warranted at this point. His aspirin dose      is 81 mg and after one year I would favor discontinuing his Plavix,      but in the setting of multiple stents in the left anterior     descending artery (LAD) he certainly will  require this for a      minimum of 12 months.  4. Large left renal cyst. I have recommended that [name] [name] defer      therapy  for now, which will likely involve percutaneous drainage. I      really would like for him to recover from his long hospitalization      with multiple problems before he has this done. Also, things are      complicated now by his need for chronic anticoagulation with      warfarin. In the future this can certainly be interrupted and he      can have this procedure performed. We will revisit this issue at a      later date.  5. Hypertension. Blood pressure is currently well-controlled on      Norvasc, Coreg and Lasix. He may ultimately require an ACE      inhibitor, but he is clearly not ready to begin this therapy. If      his renal function returns to near normal, we can consider this in      the future with assistance regarding this decision from Dr.      Darrick Penna.  6. Thrombocytopenia. This is resolved after withdrawal of the inciting      agent, which was likely Integrilin.   For followup, Mr. Summer will be seen in the Coumadin Clinic next week. I  would like to see him back in four weeks to assess how he is doing from  a cardiac standpoint. He will continue to follow with regular labs from  the [facility].     Veverly Fells. Excell Seltzer, MD     MDC/MedQ  DD: 03/23/2007  DT: 03/23/2007  Job #: [id number]   cc:   Tammy R. Collins Scotland, M.D.  James L. Deterding, M.D.  Lindaann Slough, M.D.

## 2011-03-30 NOTE — Assessment & Plan Note (Signed)
[facility]                            CARDIOLOGY OFFICE NOTE   FRISCO, CORDTS                           MRN:          [id number]  DATE:05/02/2007                            DOB:          Feb 26, 1941    [name] [name] was seen in outpatient followup at the [facility] Cardiology  office on May 02, 2007.  [name] [name] is a 70 year old gentleman well  known to me from a prolonged hospitalization in April of this year after  suffering an anterior wall myocardial infarction.  [name] [name] underwent  stenting of the LAD with multiple stents complicated by the development  of acute renal failure and severe thrombocytopenia.  He also developed  marked anemia and required blood transfusion.  He fortunately needed  only a few sessions of dialysis and then his oliguria resolved.  His  kidney function has returned to near normal.  He tells me that his  latest creatinine was 1.4 gm/dl.   Overall, he is feeling better.  He has some exertional dyspnea, which is  a longstanding complaint.  This is no worse than his baseline.  He does  have a complaint of postural lightheadedness that occurs with standing.  He has had some episodes of dizziness.  He has not had any near syncope  or syncope.  He denies chest pain, orthopnea, PND, or edema.   During his hospitalization, he had paroxysmal atrial fibrillation but  left the hospital in sinus rhythm.  At his return visit in May, he was  found to be in atrial fib.  I started him on Coumadin at that time, and  he had a great deal of difficulty tolerating the Coumadin.  His INR  reached 15 on two separate occasions, in spite of taking his medications  as instructed.  He became incredibly frustrated with the situation and  after an at-length discussion, we decided to discontinue his Coumadin.   CURRENT MEDICATIONS:  1. Aspirin 81 mg daily.  2. Plavix 75 mg daily.  3. Nephro-Vite daily.  4. Coreg 12.5 mg twice daily.  5. Lasix 80 mg  daily.  6. Norvasc 5 mg daily.  7. Os-Cal 500 mg 3 times daily.  8. Simvastatin 80 mg daily.   ALLERGIES:  NKDA.   PHYSICAL EXAMINATION:  GENERAL:  The patient is alert and oriented.  He  is in no acute distress.  VITAL SIGNS:  Weight is 242 pounds.  Blood pressure 125/77, heart rate  62, respiratory rate 16.  HEENT:  Normal.  NECK:  Normal carotid upstrokes without bruits.  Jugular venous pressure  is normal.  No thyromegaly or thyroid nodules.  LUNGS:  Clear to auscultation bilaterally.  HEART:  Irregularly irregular without murmurs or gallops.  Heart sounds  are distant.  ABDOMEN:  Soft, obese, nontender.  No organomegaly.  No abdominal  bruits.  EXTREMITIES:  There is trace bilateral pretibial edema.  Otherwise,  peripheral pulses are 2+ and equal throughout.  SKIN: Warm and dry without rash.   EKG shows atrial fibrillation with controlled ventricular response.  There is an inferior MI  of age indeterminate.  Nonspecific anterolateral  T wave changes are present.   ASSESSMENT:  [name] [name] is currently stable from a cardiovascular  standpoint.  His cardiac problems are as follows:  1. Anterior myocardial infarction, in the absence of ST elevation.  As      above, his catheterization demonstrated severe left anterior      descending artery stenosis in multiple areas.  He had three drug-      eluting stents placed in the left anterior descending artery for      treatment.  Will continue dual antiplatelet therapy with aspirin      and clopidogrel.  Will continue aggressive risk factor modification      with treatment of his hypertension and dyslipidemia.  I encouraged      increase in his exercise.  He has not been exercising because of      the hot weather.  We talked about either exercising at the [facility] or      walking indoors at a mall.  I would like to see [name] [name] back in      three months for regular followup.  2. Atrial fibrillation:  His ventricular response is  controlled.  At      this point, I am going to continue him on aspirin and Plavix but am      not going to resume anticoagulation with Coumadin.  We had a      discussion about the risks and benefits of Coumadin in the setting      of his atrial fibrillation.  His concomitant stroke risk factors      include hypertension and left ventricular dysfunction.  He does not      have diabetes or prior stroke.  May reconsider the issue in the      future, but at this time, I think he is best served by avoiding      anticoagulation, as his bleeding risk is clearly substantial and he      does not desire the frequent followup required with Coumadin.  3. Hypertension:  Blood pressure is at goal range today.  He has had      some blood pressures below 100 systolic.  With his dizziness, I      asked him to hold his Norvasc and continue his Coreg at the      present.  His wife is going to monitor his blood pressure at home.      If he has consecutive blood pressure readings greater than 140/90,      I have asked him to contact the office and will resume his Norvasc,      possibly at a 2.5 mg dose.  4. Contrast nephropathy with renal failure:  I am pleased that his      kidney function has returned to near-normal.  He is under the care      of Dr. Darrick Penna.  At the time of his return visit pending his      blood pressure and creatinine, I may carefully start him on an ACE      inhibitor, as I think it would benefit from a cardiac standpoint.  5. Left ventricular dysfunction:  Echo at the time of his myocardial      infarction showed an left ventricular ejection fraction of 35-40%      in the anteroapical territory.  I would like to repeat his      echocardiogram in one month, which will  be three months out from      his initial assessment and revascularization.  6. Dyslipidemia:  On high dose simvastatin.  Will recheck lipids and      LFTs in one month as well.   FOLLOW UP:  As described, will follow  up with [name] [name] in three months  and will follow up by telephone after the results of his echocardiogram  and lipids are available.     Veverly Fells. Excell Seltzer, MD  Electronically Signed    MDC/MedQ  DD: 05/02/2007  DT: 05/02/2007  Job #: [id number]   cc:   Fayrene Fearing L. Deterding, M.D.  Tammy R. Collins Scotland, M.D.

## 2011-03-30 NOTE — Discharge Summary (Signed)
NAME:  [name] [name], [name] [name] NO.:  [id number]   MEDICAL RECORD NO.:  [id number]          PATIENT TYPE:  INP   LOCATION:  3731                         FACILITY:  [facility]   PHYSICIAN:  Veverly Fells. Excell Seltzer, MD  DATE OF BIRTH:  1941-10-07   DATE OF ADMISSION:  03/02/2007  DATE OF DISCHARGE:  03/17/2007                               DISCHARGE SUMMARY   PRIMARY CARDIOLOGIST:  Dr. Excell Seltzer   PRIMARY CARE PHYSICIAN:  Dr. Herb Grays   NEPHROLOGIST:  Dr. Fayrene Fearing Deterding   UROLOGY CONSULTATION:  Dr. Lindaann Slough.   PRINCIPAL DIAGNOSIS:  Non-ST myocardial infarction/coronary artery  disease.   SECONDARY DIAGNOSES:  1. Acute renal failure.  2. Contrast-induced nephropathy.  3. Hypertension.  4. Hyperlipidemia.  5. Paroxysmal atrial fibrillation.  6. Acute anemia.  7. Large left renal cyst.  8. Acute thrombocytopenia.  9. Ischemic cardiomyopathy.  10.Acute congestive heart failure secondary to volume overload/renal      failure.  11.Metabolic acidosis.   ALLERGIES:  IV CONTRAST CAUSED ACUTE RENAL FAILURE REQUIRING DIALYSIS.   PROCEDURES:  1. Left heart cardiac catheterization.  2. Successful PCI and stenting of the left anterior descending      coronary artery with placement of 3 Cypher drug-eluting stents.  3. Diatek catheter placement for use during dialysis.  4. 2 D echocardiogram.  5. CT of the abdomen with and without contrast.  6. Renal ultrasound.   HISTORY OF PRESENT ILLNESS:  A 70 year old married Caucasian male with  no prior history of coronary artery disease who, approximately 3 days  prior to admission began to experience nonradiating exertional chest  discomfort associated with shortness of breath and diaphoresis.  He  initially did not seek medical care but then began to note worsening  pressure with weakness, nausea, and vomiting, prompting him to present  to his primary care [name] [name]'s office on March 02, 2007.  ECG there  revealed minimal inferior  ST segmental ablation with anterior Q waves.  The patient was taken emergently to the [facility] Emergency Room for  further evaluation.  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  Secondary to ongoing chest discomfort, the patient was evaluated  by cardiology and taken to the cath lab emergently after point of care  markers revealed a troponin of 8.54 with a CK-MB of 50.8.   HOSPITAL COURSE:  Cardiac catheterization was performed emergently on  the evening of April 17, and this revealed significant diffuse disease  throughout the LAD including a 75 in the proximal LAD and a 99% stenosis  in the mid LAD.  The proximal LAD was treated with a 3.5 x 13 mm Cypher  drug-eluting stent while the mid LAD was treated with a 3.0 x 18 mm  Cypher along with a 2.75 x 23 mm Cypher (both drug-eluting stents).  The  patient tolerated this procedure well.  In light of the finding on the  abdominal film in  the ER, decision was made to pursue a CT of the  abdomen and pelvis to further evaluate.  CT of the abdomen was performed  April 18, and this showed a large 20 x 17 x 16 cm left renal cyst,  appearing to compress the descending colon without evidence of  obstruction.  Secondary to this, the patient was evaluated by Dr. Brunilda Payor  from urology, who recommended needle aspiration.  Unfortunately, Mr.  [name] bumped his creatinine significantly following catheterization.  On  admission, the morning of April 18, his creatinine was 0.97 and by the  afternoon of April 19, it had risen to 5.74.  As a result, nephrology  was consulted and suspected contrast-induced nephropathy.  All ACE  inhibition and diuretics were discontinued.  Secondary to continued rise  in the patient's creatinine to a maximum of 11.06 during this admission,  a Diatek catheter was placed and  because of metabolic acidosis, anuria,  and volume overload, dialysis was initiated on April 21.  The patient  underwent a total of 4 dialysis sessions on April 21, 22, 24, and 26 and  began to make urine on his own with associated downward trend of his  creatinine to 8.15 today.   Also noted during this admission was paroxysmal atrial fibrillation  which was first found on Saturday, April 19.  This has always been  asymptomatic and well rate-controlled on beta blocker therapy.  Secondary to anemia which was first noted on April 21, the patient was  not able to be anticoagulated beyond the use of aspirin and Plavix.  He  has not had any atrial fibrillation in greater than 72 hours.  We have  no plans for long-term anticoagulation at this point.   Following percutaneous intervention with the use of 2B3 inhibitor, Mr.  [name] exhibited significant thrombocytopenia with a drop in his  platelets to a low of 30,000 on April 20 and 21.  His platelets  subsequently recovered well and on discharge, are 466,000.   [name] [name] underwent 2 D echocardiogram on April 18 which showed an EF of  35-40% with a periapical akinesis as well as anteroseptal hypokinesis  and moderate LVH.  Secondary to this ischemic cardiomyopathy, he has  been treated with beta blocker and diuretic therapy (once his renal  function had begun to improve).  ACE inhibitor/ARB is currently on hold  secondary to his bout of acute renal failure during this  hospitalization.  We will continue to follow his BUN and creatinine  posthospitalization with an eye towards initiating ACE inhibitor at some  point.  Further, we will plan to follow up with a 2 D echocardiogram in  6-8 weeks to reevaluate LV function and will determine if [name] [name] is a  candidate for ICD therapy if his EF is below 35%.   [name] [name] did receive blood transfusion during this admission for a low H&H of 8.0 and 22.3 on April 22.  He has not required additional   transfusions following that.  He did have a fecal occult blood positive;  however, he did not have any frank melena or bright red blood per  rectum.  We have recommended that he follow up with his primary care for  additional anemia and GI evaluation.   Finally, Mr. Yero was noted to have an Enterococcus urinary tract  infection.  He has completed a 10 day course of amoxicillin and will not  be discharged home on antibiotics.   DISCHARGE LABORATORY:  Hemoglobin 10.3,  hematocrit 29.3, WBC 9.9,  platelets 466, sodium 131, potassium 4.0, chloride 96, CO2 23, BUN 74,  creatinine 8.15, glucose 94, albumin 3.1, calcium 7.05, __________ 8.0,  total cholesterol 190, triglycerides 220, HDL 26, LDL 120, serum iron  38, TIBC 263, ferritin 1089.  Hepatitis B surface antigen was negative.  TSH was 3.737 with a free T4 of 0.70.  Peak CK 1117, peak MB 70.3, peak  troponin I 53.25, hemoglobin A1c was 5.6.   DISPOSITION:  Mr. Foree is being discharged home today in good  condition.   FOLLOW UP PLANS AND APPOINTMENT:  It is recommended that he follow up  with his primary care Sidonia Nutter within the next 1 week for additional  anemia evaluation and GI referral.  He is to follow up on Monday, May 5  for a blood chemistry at our office in [place].  He is to have  subsequent follow up with Dr. Tonny Bollman on May 8, at 4 p.m.  We  will also arrange for renal followup with Dr. Darrick Penna in the next 1-2  weeks.  We will also arrange urologic followup with Dr. Brunilda Payor concerning  his renal cyst.   DISCHARGE MEDICATIONS:  1. Aspirin 81 mg daily.  2. Plavix 75 mg daily.  3. Nephro-Vite 1 daily.  4. Os-Cal 500 mg 2 tabs t.i.d.  5. Coreg 12.5 mg b.i.d.  6. Lasix 80 mg daily.  7. Norvasc 5 mg daily.  8. Nitroglycerin 0.4 mg sublingual p.r.n. chest pain.  9. Lipitor 80 mg q.h.s.   PENDING LABORATORY STUDIES:  None.   DURATION OF DISCHARGE ENCOUNTER:  65 minutes including physician time.       Nicolasa Ducking, ANP      Veverly Fells. Excell Seltzer, MD  Electronically Signed    CB/MEDQ  D:  03/17/2007  T:  03/17/2007  Job:  [id number]   cc:   Tammy R. Collins Scotland, M.D.  James L. Deterding, M.D.  Lindaann Slough, M.D.

## 2011-03-30 NOTE — Assessment & Plan Note (Signed)
[facility]                            CARDIOLOGY OFFICE NOTE   ABDULAHAD, MEDEROS                           MRN:          [id number]  DATE:08/02/2007                            DOB:          1940/12/21    [name] [name] was seen in followup at the [facility] Cardiology office on  August 02, 2007.  [name] [name] is a 70 year old gentleman with a history  of anterior wall myocardial infarction, contrast nephropathy leading to  renal failure, and atrial fibrillation.  [name] [name] renal function has  recovered and his latest creatinine has been in the range of 1.3 to 1.4.  Symptomatically, he has improved and has minimal symptoms.  He does have  occasional shortness of breath.  His activity level is relatively good  but he does not participate in any regular exercise.  He denies chest  pain, orthopnea, PND, palpitations or syncope.  He does have occasional  dizziness.  He has no other complaints at this time.   CURRENT MEDICATIONS:  1. Aspirin 81 mg.  2. Plavix 75 mg.  3. Coreg 12.5 mg twice daily.  4. Lasix 80 mg daily.  5. Norvasc 5 mg daily, currently on hold.  6. Simvastatin 80 mg daily.  7. Centrum Silver daily.  8. Zemplar 1 mcg daily.   ALLERGIES:  NKDA.   PHYSICAL EXAM:  The patient is alert and oriented.  He is in no acute  distress.  Weight is 252 pounds.  Blood pressure is 118/80, heart rate  64, respiratory rate 16.  HEENT:  Normal.  NECK:  Normal carotid upstrokes without bruits.  Jugular venous pressure  is normal.  LUNGS:  Clear to auscultation bilaterally.  HEART:  Regular rate and rhythm without murmurs or gallops.  ABDOMEN:  Soft, obese, nontender.  No organomegaly, no abdominal bruits.  EXTREMITIES:  No clubbing, cyanosis or edema.  Peripheral pulses are 2+  and equal throughout.   EKG shows atrial fibrillation with controlled ventricular response,  possible inferior infarct.   ASSESSMENT:  [name] [name] is currently stable from a  cardiovascular  standpoint.  His cardiac issues are as follows:  1. Coronary artery disease status post myocardial infarction.  Mr.      [name] has had nice recovery of his left ventricular function.  He      initially had left ventricular ejection fraction in the range of 35      to 45%.  His followup echocardiogram from July 15th showed left      ventricular ejection fraction between 55 and 60%, there were no      regional wall motion abnormalities identified.  The left      ventricular wall thickness was mildly increased and the left atrium      was moderately dilated.  I am very pleased with his recovery of      left ventricular function and have suggested continuation of his      current medical therapy which includes carvedilol 12.5 mg twice      daily.  I had been planning on reinitiating an ACE  inhibitor in the      setting of his coronary artery disease, but with recovery of his      left ventricular function and ideal blood pressure at present I am      inclined to hold off on starting any new antihypertensive      medication.  He needs to continue on dual antiplatelet therapy with      aspirin and clopidogrel and I would favor continuing on these      medications for two years, if he tolerates, based on overlapping      drug eluting stents in his left anterior descending.  2. Chronic atrial fibrillation.  [name] [name] has been in atrial      fibrillation over the last several months.  His rate is well      controlled and he does not appear to be symptomatic.  He was      previously anticoagulated with Coumadin but had a great deal of      difficulty with high INRs, on two separate occasions with INRs of      16.  We made a decision to discontinue Coumadin in the setting of      elevated bleeding risk.  With him taking aspirin and Plavix and      prone to problems with high INRs, he was in favor of holding off.      I reviewed the small stroke risk, but I think his risk of stroke       overall is quite low in the setting of normalization of left      ventricular function and absence of diabetes.  3. Dyslipidemia.  He continues on simvastatin 80 mg, his most recent      lipids showed a total cholesterol of 107 with an HDL of 24 and an      LDL of 51; will continue without medicine changes.  4. Hypertension.  Continue with Coreg.  Discussion as above.  No other      antihypertensive medications indicated at this point.   For followup, I would like to see [name] [name] back in 4 months.     Veverly Fells. Excell Seltzer, MD  Electronically Signed    MDC/MedQ  DD: 08/06/2007  DT: 08/06/2007  Job #: [id number]

## 2011-03-30 NOTE — Assessment & Plan Note (Signed)
[facility]                            CARDIOLOGY OFFICE NOTE   NAME:[name] [name]                           MRN:          [id number]  DATE:06/03/2008                            DOB:          1940/12/16    [name] [name] was seen in followup at the [facility] Cardiology office on June 03, 2008.  He is a 70 year old gentleman with coronary artery disease,  atrial fibrillation, and history of contrast nephropathy.   [name] [name] is doing well at present.  He complains of generalized  fatigue.  He denies chest pain.  He has mild exertional dyspnea that is  stable over time.  He has been relatively sedentary and has not been  following a prudent diet.  He denies palpitations, edema,  lightheadedness, or syncope.   MEDICATIONS:  1. Aspirin 81 mg daily.  2. Plavix 75 mg daily.  3. Coreg 12.5 mg twice daily.  4. Simvastatin 80 mg daily.  5. Centrum Silver daily.  6. Zemplar 1 mcg daily.  7. K-Dur 20 mEq daily.  8. Lasix 40 mg twice daily.   ALLERGIES:  NKDA.   PHYSICAL EXAMINATION:  GENERAL:  The patient is alert and oriented, in  no acute distress.  VITAL SIGNS:  Weight is 257 pounds; blood pressure 140/80, on my recheck  132/72; heart rate 70; and respiratory rate 16.  HEENT:  Normal.  NECK:  Normal.  Carotid upstrokes without bruits.  Jugular venous  pressure is normal.  LUNGS:  Clear to auscultation bilaterally.  HEART:  Irregularly irregular without murmurs or gallops.  ABDOMEN:  Soft, obese, and nontender.  No organomegaly.  EXTREMITIES:  No clubbing, cyanosis, or edema.  Peripheral pulses 2+ and  equal.   EKG shows atrial fibrillation with age indeterminate inferior MI.  No  significant ST-segment or T-wave changes.   ASSESSMENT:  1. Coronary artery disease with prior stenting of the left anterior      descending artery using Cypher drug-eluting stents.  [name] [name]      intervention was 15 months ago.  He has no evidence of angina.  He  does complain of generalized fatigue.  Since he has had multiple      stents in the left anterior descending artery.  We will perform a      stress Myoview scan at the time of his return visit in 6 months.      Otherwise, we will continue medical therapy as outlined.  Of note,      he requests interruption in his antiplatelet therapy for drainage      of left renal cyst.  He has a large cyst and really has wanted to      get it drained for several months.  Since he is over 12 months out,      I think it is okay to temporarily interrupt his antiplatelet      therapy.  I reviewed the small risk of late stent thrombosis, but I      think at this point, the timing is reasonable to move forward with  this procedure.  I asked him to resume aspirin and Plavix as soon      as possible following the procedure.  2. Chronic atrial fibrillation.  Previous attempt in anticoagulation      with Coumadin resulted in markedly elevated INRs up to greater than      15 on 2 occasions.  CHADS score is 1 with hypertension the only      risk factor.  Continue with antiplatelet therapy alone.      Discussions in the past with the patient's strong preference to not      be on Coumadin.  3. Dyslipidemia.  Continue high-dose statin.  The patient is due for      lipids and LFTs.  4. Previous left ventricular dysfunction.  Recovery of left      ventricular function based on most recent echo from July of last      year with left ventricular ejection fraction now 55-60%.  5. History of contrast nephropathy.  Renal function has improved, and      the creatinine was in the normal range, last checked in April 2009      at 1.3.   For followup, I would like to see [name] [name] in 6 months with his  exercise Myoview stress scan.  Lipids will be followed up by telephone.     Veverly Fells. Excell Seltzer, MD  Electronically Signed    MDC/MedQ  DD: 06/03/2008  DT: 06/04/2008  Job #: [id number]

## 2011-04-02 NOTE — H&P (Signed)
NAME:  ERCEL, PEPITONE NO.:  [id number]   MEDICAL RECORD NO.:  [id number]          PATIENT TYPE:  INP   LOCATION:  2807                         FACILITY:  [facility]   PHYSICIAN:  Bevelyn Buckles. Bensimhon, MDDATE OF BIRTH:  11-09-1941   DATE OF ADMISSION:  03/02/2007  DATE OF DISCHARGE:                              HISTORY & PHYSICAL   PRIMARY CARDIOLOGIST:  Will be new, Dr. Tonny Bollman.   PRIMARY CARE PHYSICIAN:  Dr. Herb Grays.   HISTORY OF PRESENT ILLNESS:  A 70 year old obese Caucasian male admitted  to the ER from Dr. Alda Berthold office after experiencing severe chest pain 2  hours in duration approximately 3 days ago.  The patient states he was  outside working in his yard and had severe chest discomfort,  nonradiating, lasting approximately 2 hours, with associated shortness  of breath and diaphoresis.  At that time he did not seek medical care.  The pain went away, he felt tired and weak but continued to stay at  home.  The following day he had severe burping and pressure in his chest  all day, on and off, into the following night with associated nausea.  The following morning, this a.m., the patient had some nausea and  vomiting and continues to have some diaphoresis, chest pressure and  burping.  The patient saw his primary care physician as a result of  this.  An EKG was completed in her office revealing some ST elevation  inferiorly with evidence of anterior Q waves.  Secondary to this, the  patient was brought to the emergency room for further evaluation at the  primary care physician's request.  In Dr. Alda Berthold office the patient was  given baby aspirin 4 tablets along with nitroglycerin prior to admission  to the ER.  The patient was seen and examined in the ER by Dr. Arvilla Meres and Dr. Tonny Bollman after being notified of possible  inferior ST elevated MI.  He was brought emergently to the Cardiac  Catheterization Lab for possible intervention at  Dr. Earmon Phoenix discretion  upon findings.   PAST MEDICAL HISTORY:  Familial hypercholesterolemia.  No other history  of diabetes, CAD, hypertension.   SOCIAL:  The patient lives in [place] with his wife.  He is a prior  smoker but has stopped smoking within the last week.  He drinks alcohol  a couple of times a week, no illicit drug use.  He exercises very  little.   FAMILY HISTORY:  Father with CAD and subsequent coronary artery bypass  grafting in his 66's who died at age 56.   CURRENT MEDICATIONS AT HOME:  None.   ALLERGIES:  None.   Prior records from 2004, revealed some Flexeril, hydrochlorothiazide and  Vicodin p.r.n.; however, this is not continuing.   LABS IN ER:  Point of care troponin 8.54, CK MB 50.8, myoglobin 230,  hemoglobin 14.1, hematocrit 41.9, white blood cells 15.4, platelets 243.  Sodium 135, potassium 4, chloride 103, CO2 26, BUN 16, creatinine 1,  glucose 134.  EKG revealing normal sinus rhythm with a ventricular  rate  of 85 b.p.m. with anterior Q waves and continuing ST elevation  inferiorly.   PHYSICAL EXAM:  Blood pressure 132/88, pulse 68, respirations 22,  temperature 98.2, weight 257 pounds.  HEENT:  Head is normocephalic and atraumatic.  Eyes PERRLA.  Mucous  membranes and mouth pink and moist.  Tongue is midline.  NECK:  Supple, obese.  There is no JVD, no carotid bruits  appreciated.\  CARDIOVASCULAR:  Regular rate and rhythm without murmurs, rubs or  gallops.  LUNGS:  Clear to auscultation.  ABDOMEN:  Obese, nontender, 2+ bowel sounds.  EXTREMITIES:  Femoral pulses 1+ bilaterally, radial pulses 1+  bilaterally.  Dorsalis pedis pulses 1+ bilaterally.  SKIN:  Warm and moist.  NEURO:  Intact.  ABDOMINAL X-RAY:  Revealed an abdominal mass on the left with pressure  on his bowels pushing them to the right.  Apparently this is not new per  Dr. Gala Romney.   IMPRESSION:  1. Probable subacute inferior lateral myocardial infarction with       ongoing nausea.  2. Persistent ST elevation inferiorly.  3. Large abdominal mass.   PLAN:  The patient was seen by myself, Dr. Gala Romney and Dr. Tonny Bollman in the emergency room.  He was given heparin 5000 unit bolus with  a heparin drip infusion.  The patient is continuing to have ongoing  symptoms.  As a result of this, the case was discussed with Dr. Excell Seltzer  by Dr. Gala Romney and it is felt that the patient would benefit from an  emergent cardiac catheterization and treatment based upon results.  This  has been discussed with the patient and his wife, including risks and  benefits and they agreed to proceed.  Will make further recommendations  throughout hospital course depending upon results of cath.      Bettey Mare. Lyman Bishop, NP      Bevelyn Buckles. Bensimhon, MD  Electronically Signed    KML/MEDQ  D:  03/02/2007  T:  03/02/2007  Job:  [id number]   cc:   Tammy R. Collins [place], M.D.

## 2011-04-02 NOTE — Consult Note (Signed)
NAME:  [name] [name], [name] [name] NO.:  [id number]   MEDICAL RECORD NO.:  [id number]          PATIENT TYPE:  INP   LOCATION:  2925                         FACILITY:  [facility]   PHYSICIAN:  [name] [name], M.D.  DATE OF BIRTH:  [birth year]   DATE OF CONSULTATION:  DATE OF DISCHARGE:                                 CONSULTATION   CONSULTATION NOTE:  Reason for consultation is acute renal failure.  Consult called by Dr. Jens [name].   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  Presented on March 02, 2007 with complaints of on-and-off chest pain  since Monday (February 27, 2007) who was found to have severe LAD by  cardiac cath and is status post 3 drug-eluding stents with normal renal  function at that time.  The patient received a contrast dry for CT of  the abdomen and pelvis the following day (February 27, 2007) and the day  after which (March 04, 2007) the patient's creatinine increased from  0.97 to 4.3 overnight.  Urine output was minimal.  We were consulted for  his acute rise in kidney function.  Of note, the patient's urine output  was 800 mL on March 02, 2007, 600 mL on March 03, 2007, and about less  than 10 mL on March 04, 2007.   ALLERGIES:  NO KNOWN DRUG ALLERGIES.   PAST MEDICAL HISTORY:  1. Remote history of ureter stricture.  Status post stent placement      for one week and discontinued after which.  The patient had      improving kidney function.  This was done in [place].  2. Known renal cyst, size unknown.  Record unavailable.   MEDICATIONS:  None.  During hospitalization, the patient was on Coreg  3.125 mg p.o. b.i.d., Captopril 3.125 mg p.o. t.i.d.  discontinued on  March 04, 2007, Lipitor 80 mg p.o. daily, aspirin 325 mg this was  changed to 81 mg on March 04, 2007.   SOCIAL HISTORY:  The patient lives in [place] with his wife.  He has  3 children, one of which has hypertension.  He has a current history of  cigar use.  Quit cigarettes 25 years ago.  The patient drinks around 1  pint of liquor every three weeks.  No history of drug abuse.   FAMILY HISTORY:  The patient's mother died at 22 years of age from  stomach cancer.  The patient's father died at age 57, he had coronary  artery disease and was status post CABG.  The patient has no siblings.   REVIEW OF SYSTEMS:  The patient was positive for sweats, shortness of  breath, GERD symptoms, odynophagia, nocturia sometimes.  No history of  NSAID use.   PHYSICAL EXAMINATION:  VITAL  SIGNS:  Temperature 98, blood pressure 106  to 124/61 to 72, respiratory rate of 17, pulse 73, O2 sats 98% on 4  liters.  GENERAL APPEARANCE:  The patient not in acute distress.  Speaking full  sentences.  HEENT:  Extraocular movements intact.  Pupils equal, round, and reactive  to light.  Oropharynx clear, no erythema or exudates.  NECK:  Supple.  No adenopathy.  No JVD.  LUNGS:  Air entry equal bilaterally.  Clear to auscultation bilaterally.  No wheezes or rhonchi.  HEART:  Irregularly irregular.  ABDOMEN:  Soft, obese, nontender.  Bowel sounds present.  EXTREMITIES:  No edema, cyanosis, or clubbing.  Good peripheral pulses.  NEUROLOGICALLY:  Alert and oriented times 4.  Cranial nerves II-XII  grossly intact.  Asterisks is negative.  No neurological deficits.  SKIN:  Multiple moles on back present showing cold stuck-on appearance.   LABORATORY DATA:  Sodium 132, potassium 4.1, chloride 100, Bicarb 23,  BUN 43, creatinine 4.3 which is elevated from 0.97, glucose of 120.  Hemoglobin 11.4, hematocrit 32.7, white cells 13.3, platelets 50,000.  Calcium 7.7, MCV 87.6.  Urinalysis showing 15  ketones, protein 100, 0 to  2 WBCs, 0 to 2 RBCs, and a few bacteria.  Abdominal x-ray showing  distended loops of small bowel compatible with ileus left abdominal  mass.  CT of the abdomen and pelvis done on March 03, 2007 showing very  large cyst of the left kidney which compresses the descending colon  without obstruction.  2D echo done on March 03, 2007 showing left  ventricle ejection fraction of 35 to 45%, mild asymmetric septal  hypertrophy, grade 1 diastolic dysfunction, mild aortic root dilatation,  left atrium mildly dilated.   ASSESSMENT/PLAN:  1. Acute renal failure.  Status post contrast for cardiac      catheterization and CT of the abdomen and pelvis.  Differential      diagnosis broad.  Pre-renal causes ruled out as patient appears      euvolemic at this time (mucus membranes moist.  Good blood      pressure, etc.).  Most likely the patient has renal versus post      renal causes.  Renal mainly secondary to contrast used in      nephropathy versus peripheral embolization to the kidneys as post      cath and setting of the patient being on an ACE inhibitor.      However, this was only for one day.  The patient does not have any      other signs of peripheral embolization at this point.  Post renal      causes highly likely with abrupt increase in creatinine.  The      patient has a large renal cyst seen on CT of the abdomen and      pelvis, which is non-obstructing but however, renal ultrasound      would be a better test.  The patient gives no history of BPH-like      symptoms.  Also, no urine output since Foley placed, which is      concerning.  Plan is to check urine studies, urinalysis, urine      sodium, urine calcium, urine eosinophils, and repeat ultrasound of      the kidneys.  Also will check a bladder scan after Foley has been      placed as there is only minimal bloody urine return.  If      obstruction seen,  we may need to call urology.  Agree with stopping      ACE inhibitor for now.  May need some IV fluids and see if urine      output improves.  Will start on NS at 50 mL an hour.  2. Status post NS TEMI.  Status post 3 drug-eluding stents to LAD.      Management per cardiology.  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  3. Atrial fibrillation.  New.  Rate controlled.  Management per cards.  4. Thrombocytopenia thought to be related to Integrilin      administration.  Awaiting follow up platelets to decide if      aspirin/Plavix to be continued.  Tough situation, especially since      the patient needs to be on the above for drug-eluding stents.      Management per cards.  5. Anemia.  Normocytic.  Most likely secondary to slight blood loss.      Monitor H and H.  6. Left renal cyst.  Will check renal ultrasound to check for ureteral      kinking from cyst or obstruction.  CT guided biopsy scheduled for      March 06, 2007 by intra-ventral radiology, recommended by urology      Dr. Brunilda Payor.      [name] [name], M.D.  Electronically Signed     SS/MEDQ  D:  03/04/2007  T:  03/05/2007  Job:  [id number]

## 2011-04-02 NOTE — Cardiovascular Report (Signed)
NAME:  [name] [name] NO.:  [id number]   MEDICAL RECORD NO.:  [id number]          PATIENT TYPE:  INP   LOCATION:  2807                         FACILITY:  [facility]   PHYSICIAN:  Veverly Fells. Excell Seltzer, MD  DATE OF BIRTH:  February 19, 1941   DATE OF PROCEDURE:  03/02/2007  DATE OF DISCHARGE:                            CARDIAC CATHETERIZATION   DATE OF PROCEDURE:  March 02, 2007.   PROCEDURE:  Selective coronary angiography and PTCA IVUS and stenting of  the proximal and mid left anterior descending.   HISTORY OF PRESENT ILLNESS:  [name] [name] is a 70 year old gentleman who  presented with 2-3 days of intermittent symptoms of chest pain and  belching.  His EKG shows borderline ST elevation inferiorly and  anteriorly.  His cardiac biomarkers were significantly elevated with a  troponin of 8.0 and a markedly elevated CK-MB.  He was referred for  emergent cardiac catheterization in that setting.   DESCRIPTION OF PROCEDURE:  Risks and indications of the procedure were  reviewed with the patient.  Informed consent was obtained.  The right  groin was prepped, draped and anesthetized with 1% lidocaine.  Using  modified Seldinger technique a 6-French sheath was placed into the right  femoral artery.  There was a kink in the sheath and I was unable to  advance the catheter well.  I elected to upsize to a 7-French sheath  which was performed over a wire.  Diagnostic views of the left and right  coronary arteries were taken using standard 6-French Judkins catheters.  After diagnostic angiography, there was high-grade disease in the LAD  with a 99% mid lesion and a 75% proximal lesion.  I elected to intervene  on this with drug-eluting stents, due to the overall length of stent  that was required.   A  7-French [id number] guiding catheter was inserted and Angiomax was used for  anticoagulation.  Once a therapeutic ACT was achieved, a cougar wire was  passed into the distal LAD beyond the lesion.   The tight mid lesion was  dilated with a 2.0 x 12 mm Maverick balloon up to 10 atmospheres on two  inflations.  Following balloon dilatation the lesion was fairly well  expanded.  I elected to advance an IVUS catheter down to evaluate both  the mid lesion reference vessel as well as the proximal lesion that was  indeterminate.  The mid lesion reference vessel appeared to be 3 mm.  Therefore, the area was stented with a 3.0 x 18-mm Cypher stent which  was deployed at 16 atmospheres.  Following stent implantation there was  poor flow in the LAD and the patient began having chest pain.  I also  used IVUS to evaluate the proximal lesion which had a minimal lumen area  of 3 square millimeters and appeared to be at least 75% stenosed.  I  planned on treating that vessel as well.  Following intracoronary  nitroglycerin and adenosine there continued to be TIMI II  flow in the  vessel.  Off the distal edge of the stent there appeared to  be a  dissection plane.  I elected to IVUS again to evaluate that area and  there was intramural hematoma in the vessel.  I then decided to cover  that area with a 2.75 x 23 mm Cypher stent which was deployed at 12  atmospheres.  Following the second stent, there was improvement in flow  in the vessel.  I gave more intracoronary nitroglycerin and the flow at  that point had significantly improved.  I then elected to stent the  proximal vessel at the area of the 75% lesion.  A 3.5 x 13 mm stent was  used and deployed at 12 atmospheres.  Following stenting there was  excellent stent expansion.  IVUS was used again on both areas and both  stents appeared well deployed by I thought post dilating them would  improve the stent expansion.  The stents in the mid area were dilated  with a 3.25 x 15 mm Quantum Maverick at up to 8 atmospheres in the  distal aspect of the stent and 16 atmospheres in the proximal aspect of  the stent.  The proximal stent was then post dilated  with a 3.75 x 8 mm  Quantum Maverick up to 16 atmospheres on two inflations.  Following this  there was TIMI III flow throughout the LAD.  There is a jailed diagonal  branch but it had TIMI III flow and I elected at that point not to try  to open up the ostial stenosis because of the long duration of the case  and the high contrast load.  The patient was transferred to the CCU in  stable condition.   FINDINGS:  Aortic pressure is 112/77 with a mean of 94.   Coronary angiography:  The left main stem has minor luminal  irregularities.  It bifurcates into the left anterior descending and  left circumflex.   The left anterior descending is diffusely diseased.  It is a large  diameter vessel that wraps around the left ventricular apex.  At the  area of the first diagonal there is a 75% proximal stenosis.  The mid  vessel has a very tight 99% stenosis with some mild post-stenotic  dilatation.  The remaining portions of the mid and distal vessel have no  significant angiographic disease.   Left circumflex is a large-caliber vessel that gives off a medium-sized  first obtuse marginal branch and then a large second obtuse marginal  branch.  The true AV groove circumflex is small beyond the second obtuse  marginal branch.  There is no significant disease in the left circumflex  system.   Right coronary artery is a medium caliber.  It has a 25% proximal  stenosis.  The remaining  portions of the vessel are free of any  significant angiographic disease.  Distally the vessel terminates in a  posterior descending artery branch and posterior AV segment with one  posterolateral branch.   ASSESSMENT:  1. 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  2. Mild right coronary artery disease.  3. No significant left circumflex disease.   DISCUSSION:  As described above three drug-eluting stents were placed in the LAD.  IVUS demonstrated excellent stent expansion at the conclusion  of the  procedure with TIMI III flow throughout that vessel.  The patient  should remain on dual antiplatelet therapy with aspirin and Plavix.  He  has a recently diagnosed abdominal mass that will need to be evaluated  at some point.  We will have to  carefully manage his antiplatelet  therapy in the setting of needing diagnostic studies for his abdominal  mass.      Veverly Fells. Excell Seltzer, MD  Electronically Signed     MDC/MEDQ  D:  03/02/2007  T:  03/03/2007  Job:  [id number]

## 2011-04-02 NOTE — Assessment & Plan Note (Signed)
[facility]                            CARDIOLOGY OFFICE NOTE   NAME:[name] [name], [name]                           MRN:          [id number]  DATE:11/19/2006                            DOB:          02-24-1941    [name] [name] was seen in follow-up at the [facility] office on  November 20, 2007.  [name] [name] is a 70 year old gentleman with coronary  artery disease.  He had an anterior wall MI back in April 2008.  He was  treated with overlapping drug-eluting stents in the LAD.  [name] [name]  course was complicated by renal failure and atrial fibrillation.   [name] [name] really has improved from a symptomatic standpoint over the  last several months.  He is essentially back to his baseline status.  He  denies chest pain, dyspnea, orthopnea, PND or palpitations.  He does  have occasional dizziness with standing.  Otherwise he has no complaints  today.  He is been exercising at the gym but he mainly does lightweight  lifting.  He really does not participate in any aerobic activity.   CURRENT MEDICATIONS:  1. Aspirin 81 mg daily.  2. Plavix 75 mg daily.  3. Coreg 12.5 mg twice daily.  4. Lasix 80 mg daily.  5. Simvastatin 80 mg daily.  6. Centrum daily.  7. Zemplar 1 mcg daily.   ALLERGIES:  No known drug allergies.   EXAM:  [name] [name] is alert and oriented.  He is in no distress.  Weight is 250, blood pressure is 104/72, heart rate is 55, respiratory  rate 16.  HEENT:  Normal.  NECK:  Normal carotid upstrokes without bruits.  Jugular venous pressure  is normal.  LUNGS:  Clear bilaterally.  HEART:  Irregularly irregular without murmurs or gallops.  ABDOMEN:  Soft, nontender, no organomegaly.  Obese.  EXTREMITIES:  No clubbing, cyanosis or edema.  Peripheral pulses are 2+  and equal throughout.   EKG shows atrial fibrillation with slow ventricular response at a rate  of 55 beats per minute.  There is a possible age-indeterminate inferior  infarct.  No  other significant changes are noted.   ASSESSMENT:  1. Coronary artery disease with prior myocardial infarction.  Mr.      [name] left ventricular function has normalized and on most recent      assessment his left ventricular ejection fraction was 55-60%.      There were no wall motion abnormalities.  I think he should      continue on his current therapy that includes Coreg, simvastatin      and dual antiplatelet therapy with aspirin and Plavix.  In the      setting of drug-eluting stent with prior myocardial infarction, we      favor at least 2 years of dual antiplatelet therapy.  I encouraged      him to begin focusing on aerobic exercise as opposed to      weightlifting.  He seems motivated to do so.  I am pleased with his      lack of angina  and overall symptomatic improvement.  2. Chronic atrial fibrillation.  [name] [name] was initially      anticoagulated with Coumadin.  Following his MI, this involved      three-drug therapy with Coumadin, aspirin and Plavix.      Unfortunately, we had a great deal of difficulty controlling his      INR.  His INR went up to the range of 16 on two separate occasions.      After a long discussion with him and his wife, we decided to      discontinue Coumadin.  We were not able to identify any obvious      cause of his high INRs.  He was monitored closely at the time.  He      may have the genetic trait that causes him to be very sensitive to      Coumadin.  In any event, I feel that his bleeding risk outweighs      the potential benefit, especially in the setting of normal left      ventricular function with an absence of other risk factors for      stroke.  Specifically, he does not have diabetes, advanced age or      prior cerebrovascular event.  He was hypertensive the during his      post-myocardial infarction course, but his hypertension may have      been related to volume in the setting of renal failure.  His blood      pressure on most  recent visits has been in the low normal range and      he is only on Coreg.  At this point he will remain on aspirin and      Plavix.  3. Dyslipidemia.  He is on high-dose simvastatin.  Lipids in July      showed a cholesterol of 107, triglycerides 162, HDL 24, LDL 51.      Will continue current therapy.  Repeat lipids and LFTs at the time      of his next office visit.  4. Left renal cyst.  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  We had a request to      interrupt his antiplatelet therapy so this could be done.  He has      had a cyst for many years and it really has not changed in size.      If the procedure cannot be done on aspirin and Plavix, I would      recommend deferring this for another year.  He is not symptomatic.   For follow-up I would like to see [name] back in 6 months.  I would  be happy to see him sooner if he has any problems.     Veverly Fells. Excell Seltzer, MD  Electronically Signed    MDC/MedQ  DD: 11/21/2007  DT: 11/22/2007  Job #: [id number]   cc:   Tammy R. Collins Scotland, M.D.  [name], M.D.

## 2011-04-02 NOTE — Consult Note (Signed)
NAME:  [name] [name], [name] [name] NO.:  [id number]   MEDICAL RECORD NO.:  [id number]          PATIENT TYPE:  INP   LOCATION:  2925                         FACILITY:  [facility]   PHYSICIAN:  Lindaann Slough, M.D.  DATE OF BIRTH:  10/17/1941   DATE OF CONSULTATION:  DATE OF DISCHARGE:                                 CONSULTATION   INDICATION FOR CONSULTATION:  Left renal cyst.   The patient is a 70 year old  male who was admitted through the  emergency room with a history of severe chest pain about 4 days ago.  He  had severe chest discomfort while working in his yard.  The pain lasted  about 2 hours and was associated with shortness of breath.  The pain  went away.  The next day he had severe burping and pressure in his  chest.  Then on the third day he started having nausea and vomiting and  diaphoresis and chest pressure.  He then went to see Dr. Collins Scotland and was  sent to the emergency room for further evaluation.  The patient was then  found to have an MI, and had a coronary stent placement done.  A CT scan  on 03/03/2007 showed a very large cystic mass from the lower pole of the  left kidney that measured 20 x 17 x 16 cm with compression of the  descending colon without obstruction.  The patient states that about 15  years ago he was told that he had a small cyst in the left kidney and he  subsequently had cystoscopy and insertion of a stent that was eventually  removed.  He has not had any problem with the kidney since.  He denies  any voiding symptoms.  He has no dysuria or hematuria.  Creatinine was  0.97 on 04/18 and earlier today it was 4.3 and it is now 5.74.  BUN went  up from 15 to 56.  The patient states that he had a PSA done at Dr.  [name] office and it was read as normal.   PAST MEDICAL HISTORY:  negative for diabetes, hypertension.   SOCIAL HISTORY:  He is married.  He quit smoking last week and drinks  alcohol about twice a week.   FAMILY HISTORY:  His father  had coronary artery bypass surgery.   MEDICATIONS:  He is currently on Plavix and aspirin, but he was not on  any medication at home.   ALLERGIES:  NO KNOWN DRUG ALLERGIES.   REVIEW OF SYSTEMS:  As noted in the HPI, otherwise everything else is  negative.   PHYSICAL EXAMINATION:  This is a well-developed 70 year old male who is  complaining of discomfort from the Foley catheter.  Blood pressure is  127/71, pulse 77, respirations 16, temperature 98.4, skin is warm and  dry, abdomen is protuberant, non tender.  He has no CVA tenderness.  Kidneys are not palpable.  He has no hepatomegaly, no splenomegaly.  Bladder is not distended at this time.  He has no inguinal hernia.  Penis and meatus are normal.  He has a Foley  catheter that is now  draining small amount of urine.  Scrotum is normal in appearance.  He  has no testicular mass.  Cords and epididymides are within normal  limits.  On rectal examination he has no external hemorrhoids.  Sphincter tone is normal.  Prostate is enlarged, 40 grams without any  nodules and seminal vesicles are not palpable.   IMPRESSION:  Large left renal cyst, renal insufficiency, coronary artery  disease.   SUGGESTIONS:  The patient will be seen by nephrology.  His renal  insufficiency is probably  contrast induced.  I do not know if his  symptoms are related at all to the large left renal cyst, however I  discussed this with Dr. Kearney Hard and he feels that cyst aspiration could be  done while the patient is on Plavix.  He will confer with Dr. Fredia Sorrow  who would perform the procedure and if Dr. Fredia Sorrow does not have any  objection we will proceed with cyst aspiration      Lindaann Slough, M.D.  Electronically Signed     MN/MEDQ  D:  03/04/2007  T:  03/05/2007  Job:  [id number]

## 2011-05-25 ENCOUNTER — Other Ambulatory Visit: Payer: Self-pay | Admitting: Cardiovascular Disease

## 2011-06-15 ENCOUNTER — Other Ambulatory Visit: Payer: Self-pay | Admitting: Dermatology

## 2011-07-20 ENCOUNTER — Other Ambulatory Visit: Payer: Self-pay | Admitting: Cardiovascular Disease

## 2011-08-19 ENCOUNTER — Encounter: Payer: Self-pay | Admitting: Cardiovascular Disease

## 2011-09-09 ENCOUNTER — Ambulatory Visit (INDEPENDENT_AMBULATORY_CARE_PROVIDER_SITE_OTHER): Payer: Medicare Other | Admitting: Cardiovascular Disease

## 2011-09-09 ENCOUNTER — Encounter: Payer: Self-pay | Admitting: Cardiovascular Disease

## 2011-09-09 DIAGNOSIS — E782 Mixed hyperlipidemia: Secondary | ICD-10-CM

## 2011-09-09 DIAGNOSIS — I4891 Unspecified atrial fibrillation: Secondary | ICD-10-CM

## 2011-09-09 DIAGNOSIS — I2510 Atherosclerotic heart disease of native coronary artery without angina pectoris: Secondary | ICD-10-CM

## 2011-09-09 NOTE — Progress Notes (Signed)
HPI:  This is a 70 year old gentleman presented for followup evaluation. He has coronary artery disease status post non-STEMI several years ago treated with drug-eluting stents in the LAD. The patient also has mild chronic kidney disease and permanent atrial fibrillation.  The patient feels well overall. He continues to exercise every day. He goes to the gym and lifts weights and also does aerobic exercises. He has no symptoms with exertion. His medications are unchanged. He specifically denies chest pain, dyspnea, or edema. He and his wife have noted that he's had some low heart rates around 50 beats per minute and had questions about that.  Outpatient Encounter Prescriptions as of 09/09/2011  Medication Sig Dispense Refill  . aspirin 325 MG tablet Take 325 mg by mouth daily.        Marland Kitchen atorvastatin (LIPITOR) 40 MG tablet Take 40 mg by mouth daily.        . calcitRIOL (ROCALTROL) 0.25 MCG capsule Take 0.25 mcg by mouth daily.        . fish oil-omega-3 fatty acids 1000 MG capsule Take 2 g by mouth daily.        . furosemide (LASIX) 40 MG tablet TAKE ONE TABLET BY MOUTH EVERY DAY  30 tablet  6  . Inositol Niacinate (NIACIN FLUSH FREE) 500 MG CAPS Take 1 capsule by mouth daily.        . Multiple Vitamins-Minerals (CENTRUM SILVER PO) Take 1 tablet by mouth daily.        Marland Kitchen NITROSTAT 0.4 MG SL tablet DISSOLVE ONE TABLET UNDER THE TONGUE EVERY 5 MINUTES AS NEEDED FOR CHEST PAIN.  DO NOT EXCEED A TOTAL OF 3 DOSES IN 15 MINUTES  25 each  12  . POTASSIUM GLUCONATE PO Take by mouth daily.          No Known Allergies  Past Medical History  Diagnosis Date  . CAD (coronary artery disease)      s/p NSTEMI 04/08, s/p LAD PCI, drug-eluting stent 04/08  . CKD (chronic kidney disease)     Hx ARF secondary to contrast nephropathy  . Atrial fibrillation   . Renal cyst   . Dyslipidemia     ROS: Negative except as per HPI  BP 126/64  Pulse 51  Resp 18  Ht 6' (1.829 m)  Wt 200 lb 6.4 oz (90.901 kg)  BMI  27.18 kg/m2  PHYSICAL EXAM: Pt is alert and oriented, NAD HEENT: normal Neck: JVP - normal, carotids 2+= without bruits Lungs: CTA bilaterally CV: irregularly irregular without murmur or gallop Abd: soft, NT, Positive BS, no hepatomegaly Ext: no C/C/E, distal pulses intact and equal Skin: warm/dry no rash  EKG:  Atrial fibrillation with slow ventricular response heart rate 52 beats per minute, otherwise within normal limits.  ASSESSMENT AND PLAN:

## 2011-09-09 NOTE — Assessment & Plan Note (Signed)
He brings in labs today showing an HDL of 34, LDL 78 and total cholesterol of 130. He is on multidrug therapy with atorvastatin, fish oil, and niacin.

## 2011-09-09 NOTE — Assessment & Plan Note (Signed)
The patient has permanent atrial fibrillation. He has a slow ventricular response and is not on any rate lowering medications. As he is asymptomatic I advised that we can continue to observe him. We reviewed symptoms that may be attributable to bradycardia and they understand to watch for these. With respect to anticoagulation, the patient has declined this after several discussions in the past. We have tried him on both Coumadin and pradaxa previously and he did not want to take these medications. He remains on aspirin 325 mg daily.

## 2011-09-09 NOTE — Assessment & Plan Note (Signed)
The patient is stable without angina. He will continue on his current medical therapy. No beta blockers secondary to bradycardia.

## 2011-09-09 NOTE — Patient Instructions (Signed)
Your physician recommends that you continue on your current medications as directed. Please refer to the Current Medication list given to you today.   Your physician wants you to follow-up in:1 YEAR  You will receive a reminder letter in the mail two months in advance. If you don't receive a letter, please call our office to schedule the follow-up appointment.  

## 2011-10-26 ENCOUNTER — Other Ambulatory Visit: Payer: Self-pay

## 2011-10-26 MED ORDER — FUROSEMIDE 40 MG PO TABS: 40.0000 mg | ORAL_TABLET | Freq: Every day | ORAL | Status: DC

## 2011-10-26 NOTE — Telephone Encounter (Signed)
.   Requested Prescriptions   Signed Prescriptions Disp Refills  . furosemide (LASIX) 40 MG tablet 30 tablet 6    Sig: Take 1 tablet (40 mg total) by mouth daily.    Authorizing Provider: Tonny Bollman D    Ordering User: Lacie Scotts   E-scribe to [facility].

## 2011-11-01 ENCOUNTER — Other Ambulatory Visit: Payer: Self-pay

## 2011-11-01 MED ORDER — FUROSEMIDE 40 MG PO TABS: 40.0000 mg | ORAL_TABLET | Freq: Every day | ORAL | Status: DC

## 2011-11-01 NOTE — Telephone Encounter (Signed)
.   Requested Prescriptions   Signed Prescriptions Disp Refills  . furosemide (LASIX) 40 MG tablet 30 tablet 11    Sig: Take 1 tablet (40 mg total) by mouth daily.    Authorizing Provider: Tonny Bollman D    Ordering User: Lacie Scotts   E-scribe to [facility].

## 2011-11-26 ENCOUNTER — Encounter (HOSPITAL_COMMUNITY): Payer: Self-pay | Admitting: Emergency Medicine

## 2011-11-26 ENCOUNTER — Other Ambulatory Visit: Payer: Self-pay

## 2011-11-26 ENCOUNTER — Emergency Department (HOSPITAL_COMMUNITY): Payer: Medicare Other

## 2011-11-26 ENCOUNTER — Emergency Department (HOSPITAL_COMMUNITY)
Admission: EM | Admit: 2011-11-26 | Discharge: 2011-11-26 | Disposition: A | Discharge status: Home / Self Care | Payer: Medicare Other | Attending: Emergency Medicine | Admitting: Emergency Medicine

## 2011-11-26 DIAGNOSIS — F518 Other sleep disorders not due to a substance or known physiological condition: Secondary | ICD-10-CM | POA: Insufficient documentation

## 2011-11-26 DIAGNOSIS — E785 Hyperlipidemia, unspecified: Secondary | ICD-10-CM | POA: Insufficient documentation

## 2011-11-26 DIAGNOSIS — E782 Mixed hyperlipidemia: Secondary | ICD-10-CM | POA: Insufficient documentation

## 2011-11-26 DIAGNOSIS — I495 Sick sinus syndrome: Secondary | ICD-10-CM | POA: Insufficient documentation

## 2011-11-26 DIAGNOSIS — R079 Chest pain, unspecified: Secondary | ICD-10-CM | POA: Insufficient documentation

## 2011-11-26 DIAGNOSIS — K219 Gastro-esophageal reflux disease without esophagitis: Secondary | ICD-10-CM

## 2011-11-26 DIAGNOSIS — I2510 Atherosclerotic heart disease of native coronary artery without angina pectoris: Secondary | ICD-10-CM | POA: Insufficient documentation

## 2011-11-26 DIAGNOSIS — I4891 Unspecified atrial fibrillation: Secondary | ICD-10-CM | POA: Insufficient documentation

## 2011-11-26 DIAGNOSIS — N189 Chronic kidney disease, unspecified: Secondary | ICD-10-CM | POA: Insufficient documentation

## 2011-11-26 HISTORY — DX: Epididymitis: N45.1

## 2011-11-26 LAB — BASIC METABOLIC PANEL
BUN: 15 mg/dL (ref 6–23)
Calcium: 9.5 mg/dL (ref 8.4–10.5)
Creatinine, Ser: 1.1 mg/dL (ref 0.50–1.35)
GFR calc Af Amer: 75 mL/min — ABNORMAL LOW (ref >90)
GFR calc non Af Amer: 65 mL/min — ABNORMAL LOW (ref >90)
Potassium: 4.2 mEq/L (ref 3.5–5.1)

## 2011-11-26 LAB — DIFFERENTIAL
Basophils Relative: 1 % (ref 0–1)
Eosinophils Absolute: 0.1 10*3/uL (ref 0.0–0.7)
Eosinophils Relative: 2 % (ref 0–5)
Monocytes Relative: 10 % (ref 3–12)
Neutrophils Relative %: 67 % (ref 43–77)

## 2011-11-26 LAB — CARDIAC PANEL(CRET KIN+CKTOT+MB+TROPI)
Relative Index: INVALID (ref 0.0–2.5)
Troponin I: <0.3 ng/mL (ref <0.30)

## 2011-11-26 LAB — CBC
HCT: 41.2 % (ref 39.0–52.0)
Hemoglobin: 13.7 g/dL (ref 13.0–17.0)
WBC: 7.5 10*3/uL (ref 4.0–10.5)

## 2011-11-26 LAB — APTT: aPTT: 31 seconds (ref 24–37)

## 2011-11-26 NOTE — ED Notes (Signed)
Pharmacy at bedside speaking with pt, no needs at this time.

## 2011-11-26 NOTE — ED Notes (Signed)
Pt has a history of cardiac stents.  Was seen by pcp today and was sent here.

## 2011-11-26 NOTE — ED Provider Notes (Addendum)
History     CSN: [id number]  Arrival date & time 11/26/11  1134   First MD Initiated Contact with Patient 11/26/11 1137      Chief Complaint  Patient presents with  . Chest Pain    (Consider location/radiation/quality/duration/timing/severity/associated sxs/prior treatment) HPI Comments: Patient's last stress test was 4 years ago  Patient is a 71 y.o. male presenting with chest pain. The history is provided by the patient.  Chest Pain The chest pain began 6 - 12 hours ago. Duration of episode(s) is 3 minutes. Chest pain occurs constantly. The chest pain is resolved. Associated with: Associated with nothing that woke him from sleep. At its most intense, the pain is at 6/10. The pain is currently at 0/10. The severity of the pain is moderate. The quality of the pain is described as aching, burning and tightness. The pain does not radiate. Exacerbated by: Nothing worsens the pain after he burped several times the pain resolved. Pertinent negatives for primary symptoms include no fever, no shortness of breath, no cough, no abdominal pain, no nausea, no vomiting and no altered mental status.  Pertinent negatives for associated symptoms include no diaphoresis. He tried nothing for the symptoms. Risk factors include male gender.  His past medical history is significant for CAD, hyperlipidemia and hypertension.  Pertinent negatives for past medical history include no diabetes.  Procedure history is positive for stress echo.     Past Medical History  Diagnosis Date  . CAD (coronary artery disease)      s/p NSTEMI 04/08, s/p LAD PCI, drug-eluting stent 04/08  . CKD (chronic kidney disease)     Hx ARF secondary to contrast nephropathy  . Atrial fibrillation   . Renal cyst   . Dyslipidemia     Past Surgical History  Procedure Date  . Cystectomy   . Arm surgery     Family History  Problem Relation Age of Onset  . Cancer    . Coronary artery disease      History  Substance Use  Topics  . Smoking status: Former Games developer  . Smokeless tobacco: Not on file  . Alcohol Use: Not on file      Review of Systems  Constitutional: Negative for fever and diaphoresis.  Respiratory: Negative for cough and shortness of breath.   Cardiovascular: Positive for chest pain.  Gastrointestinal: Negative for nausea, vomiting and abdominal pain.  Psychiatric/Behavioral: Negative for altered mental status.  All other systems reviewed and are negative.    Allergies  Review of patient's allergies indicates no known allergies.  Home Medications   Current Outpatient Rx  Name Route Sig Dispense Refill  . ASPIRIN 325 MG PO TABS Oral Take 325 mg by mouth daily.      . ATORVASTATIN CALCIUM 40 MG PO TABS Oral Take 40 mg by mouth daily.      Marland Kitchen CALCITRIOL 0.25 MCG PO CAPS Oral Take 0.25 mcg by mouth daily.      . FUROSEMIDE 40 MG PO TABS Oral Take 1 tablet (40 mg total) by mouth daily. 30 tablet 11  . INOSITOL NIACINATE 500 MG PO CAPS Oral Take 1 capsule by mouth daily.      . CENTRUM SILVER PO Oral Take 1 tablet by mouth daily.      Marland Kitchen NITROSTAT 0.4 MG SL SUBL  DISSOLVE ONE TABLET UNDER THE TONGUE EVERY 5 MINUTES AS NEEDED FOR CHEST PAIN.  DO NOT EXCEED A TOTAL OF 3 DOSES IN 15 MINUTES 25 each 12  Dispense as written.    BP 140/68  Pulse 47  Temp(Src) 98.2 F (36.8 C) (Oral)  Resp 16  Ht 6' (1.829 m)  Wt 197 lb (89.359 kg)  BMI 26.72 kg/m2  SpO2 95%  Physical Exam  Nursing note and vitals reviewed. Constitutional: He is oriented to person, place, and time. He appears well-developed and well-nourished. No distress.  HENT:  Head: Normocephalic and atraumatic.  Mouth/Throat: Oropharynx is clear and moist.  Eyes: Conjunctivae and EOM are normal. Pupils are equal, round, and reactive to light.  Neck: Normal range of motion. Neck supple.  Cardiovascular: Intact distal pulses.  An irregularly irregular rhythm present. Bradycardia present.   No murmur heard. Pulmonary/Chest:  Effort normal and breath sounds normal. No respiratory distress. He has no wheezes. He has no rales.  Abdominal: Soft. He exhibits no distension. There is no tenderness. There is no rebound and no guarding.  Musculoskeletal: Normal range of motion. He exhibits no edema and no tenderness.  Neurological: He is alert and oriented to person, place, and time.  Skin: Skin is warm and dry. No rash noted. No erythema.  Psychiatric: He has a normal mood and affect. His behavior is normal.    ED Course  Procedures (including critical care time)  Labs Reviewed  BASIC METABOLIC PANEL - Abnormal; Notable for the following:    Glucose, Bld 103 (*)    GFR calc non Af Amer 65 (*)    GFR calc Af Amer 75 (*)    All other components within normal limits  CBC  DIFFERENTIAL  PROTIME-INR  APTT  CARDIAC PANEL(CRET KIN+CKTOT+MB+TROPI)   Dg Chest Port 1 View  11/26/2011  *RADIOLOGY REPORT*  Clinical Data: Chest pain.  PORTABLE CHEST - 1 VIEW  Comparison: Chest 07/27/2011.  Findings: Lungs are clear.  Heart size is normal.  No pneumothorax or pleural fluid.  Degenerative change about the left shoulder noted.  IMPRESSION: No acute disease.  Original Report Authenticated By: Bernadene Bell. Maricela Curet, M.D.     Date: 11/26/2011  Rate: 51  Rhythm: atrial fibrillation with bradycardia  QRS Axis: normal  Intervals: normal  ST/T Wave abnormalities: normal  Conduction Disutrbances:none  Narrative Interpretation:   Old EKG Reviewed: unchanged    No diagnosis found.    MDM   Patient with a history of MI requiring 3 stents in 2008 who woke up this morning at 3:30 AM with midsternal chest pain which relieved after he burped. He saw Dewain Penning today in the clinic after the symptoms and she spoke with Dr. Excell Seltzer with [place] cards and patient was sent here for further evaluation. On exam here he is chest pain-free with normal exam other than A. fib and bradycardia.  He is artery received 325 mg of aspirin and no  need for nitroglycerin at this time as he is chest pain-free.  12:50 PM Labs within normal limits. St.  cardiology coming to see.     Gwyneth Sprout, MD 11/26/11 1534 3:49 PM All labs within normal limits and chest pain have been over 6 hours ago. Cardiology came and saw patient and feel that he is safe to go home and followup with them.  Gwyneth Sprout, MD 11/26/11 1550

## 2011-11-26 NOTE — ED Notes (Signed)
Per ems- pt woke up this morning at 3am with some chest pain.  Pt went to see pcp and was sent here.  CP has been relieved with no medications.

## 2011-11-26 NOTE — Consult Note (Signed)
History and Physical   Patient ID: [name] [name] MRN: [id number], DOB/AGE: 06-18-1941   Admit date: 11/26/2011 Date of Consult: 11/26/2011   Primary Physician: Herb Grays, MD, MD Primary Cardiologist: Tonny Bollman, MD  Pt. Profile:   [name] [name] is a 71 yo male with PMHx significant for CAD (s/p NSTEMI 04/08 with multiple DESs to LAD, last echo 03/11 revealed mild LVH, EF 55-60%, no WMA, severe LA/RA dilation), atrial fibrillation (with slow ventricular response, on full-dose ASA, on Coumadin and Pradaxa in the past) and hyperlipidemia who presents to [facility] ED today with chest pain.   In terms of chronic anticoagulation, per an office visit note in 04/12 he was initially treated with warfarin but had extreme sensitivity and had INRs of greater than 10 on 2 separate occasions. He was subsequently started on Pradaxa but did not fill the prescription because of concerns over side effects and medication cost.  Problem List: Past Medical History  Diagnosis Date  . CAD (coronary artery disease)      s/p NSTEMI 04/08, s/p LAD PCI, drug-eluting stent 04/08  . CKD (chronic kidney disease)     Hx ARF secondary to contrast nephropathy  . Atrial fibrillation   . 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 Dyslipidemia   . Epididymitis     Past Surgical History  Procedure Date  . Cystectomy 07/15/08  . Arm surgery   . Ureter surgery      Allergies:  Allergies  Allergen Reactions  . Contrast Media (Iodinated Diagnostic Agents)     Contrast-induced nephropathy requiring dialysis in 02/2007.    HPI:   He reports experiencing SSCP this morning lasting for seconds described as tightness lasting for 1-2 minutes rated . He said it felt like gas and was relieved by belching. He denies sob, palpitations, diaphoresis, lightheadedness, n/v, leg swelling, orthopnea, PND, fevers, chills, cough, extended travel and sick contacts.   He called his PCP with these complaints and was transported to the ED by EMS.     While in the ED, EKG revealed unchanged anteroseptal infarction from prior tracings, negative CEs, CXR negative for acute cardiopulmonary process. CBC, BMET WNL.  Inpatient Medications:      (Not in a hospital admission)  Family History  Problem Relation Age of Onset  . Cancer Mother   . Coronary artery disease Father   . Heart attack Father   . Diabetes type II Mother   . Hyperlipidemia Mother      History   Social History  . Marital Status: Married    Spouse Name: N/A    Number of Children: 3  . Years of Education: N/A   Occupational History  . Retired    Social History Main Topics  . Smoking status: Former Smoker -- 2.0 packs/day for 10 years    Types: Cigarettes, Cigars  . Smokeless tobacco: Not on file  . Alcohol Use: No  . Drug Use: No  . Sexually Active: Not on file   Other Topics Concern  . Not on file   Social History Narrative   Lives in [place], [place] with wife. Exercising 3-4x/week.      Review of Systems: General: negative for chills, fever, night sweats or weight changes.  Cardiovascular: positive for chest pain, negative for dyspnea on exertion, edema, orthopnea, palpitations, paroxysmal nocturnal dyspnea or shortness of breath Dermatological: negative for rash Respiratory: negative for cough or wheezing Urologic: negative for hematuria Abdominal: negative for nausea, vomiting, diarrhea,  bright red blood per rectum, melena, or hematemesis Neurologic: negative for visual changes, syncope, or dizziness All other systems reviewed and are otherwise negative except as noted above.  Physical Exam: Blood pressure 133/68, pulse 64, temperature 98.2 F (36.8 C), temperature source Oral, resp. rate 12, height 6' (1.829 m), weight 89.359 kg (197 lb), SpO2 100.00%.   General: Well developed, well nourished, NAD Head: Normocephalic, atraumatic, sclera non-icteric, no xanthomas  Neck: Supple. Negative for carotid bruits. JVD not elevated. Lungs:  Clear bilaterally to auscultation without wheezes, rales, or rhonchi. Breathing is unlabored Heart: irregularly irregular with S1 S2. No murmurs, rubs, or gallops appreciated. Abdomen: Soft, non-tender, non-distended with normoactive bowel sounds. No hepatomegaly. No rebound/guarding. No obvious abdominal masses. Msk:  Strength and tone appears normal for age. Extremities: No clubbing, cyanosis or edema.  Distal pedal pulses are 2+ and equal bilaterally. Neuro: Alert and oriented X 3. Moves all extremities spontaneously. Psych:  Responds to questions appropriately with a normal affect.  Labs: Recent Labs  Basename 11/26/11 1154   WBC 7.5   HGB 13.7   HCT 41.2   MCV 90.4   PLT 176    Lab 11/26/11 1154  NA 141  K 4.2  CL 103  CO2 30  BUN 15  CREATININE 1.10  CALCIUM 9.5  PROT --  BILITOT --  ALKPHOS --  ALT --  AST --  AMYLASE --  LIPASE --  GLUCOSE 103*   No results found for this basename: HGBA1C in the last 72 hours Recent Labs  Basename 11/26/11 1154   CKTOTAL 87   CKMB 3.2   CKMBINDEX --   TROPONINI <0.30   Radiology/Studies: Dg Chest Port 1 View  11/26/2011  *RADIOLOGY REPORT*  Clinical Data: Chest pain.  PORTABLE CHEST - 1 VIEW  Comparison: Chest 07/27/2011.  Findings: Lungs are clear.  Heart size is normal.  No pneumothorax or pleural fluid.  Degenerative change about the left shoulder noted.  IMPRESSION: No acute disease.  Original Report Authenticated By: Bernadene Bell. D'ALESSIO, M.D.   EKG: atrial fibrillation, 51 bpm, poor R wave progression, low voltage V1, V2  ASSESSMENT AND PLAN:   1. Chest pain- atypical for cardiac source. Pt does have history of MI with prior stents and cardiac risk factors; however, pt says pain lasted 1-2 minutes and was relieved with belching. HPI consistent with reflux symptoms. He has pursued risk reduction, hyperlipidemia is better-controlled, nonsmoker, he has lost 60 pounds since prior MI and exercises 3-4x/week on the elliptical.  EKG reveals unchanged anteroseptal infarct from prior tracings otherwise no acute ST-T wave changes, first set of CEs negative. CXR unremarkable for acute cardiopulmonary process. Low suspicion of ischemic chest pain. Has agreed to return if worsens.  - Ok to discharge home, continue home medications   - Has NTG SL PRN   - Will schedule outpatient Myoview  - Will recommend OTC H2 blocker    2. Atrial fibrillation- with slow ventricular response; stable, at baseline. Issues with Coumadin, Pradaxa in the past  - Continue full-dose ASA  - Hold off on rate-control agents   3. Anxiety- encouraged to address with PCP   Signed, R. Hurman Horn, PA-C 11/26/2011, 3:18 PM   Patient seen and examined and history reviewed. Agree with above findings and plan.[name] [name] had a fairly minor episode of chest discomfort early this am. He reports accidentally putting a lot of red pepper in his soup last night. Symptoms only lasted a couple of minutes and were  relieved with belching. Evaluation in the ED has been unremarkable. Exam is benign. Ecg shows no significant ST-T changes. Cardiac enzymes are negative. I think his risk is low for ACS. We will discharge today. He is instructed to return if his pain recurs. We will arrange an outpatient myoview since it has been 2-3 years since his last evaluation.  Thedora Hinders, [facility] 11/26/2011 3:51 PM

## 2011-11-29 ENCOUNTER — Other Ambulatory Visit (HOSPITAL_COMMUNITY): Payer: Self-pay | Admitting: Cardiovascular Disease

## 2011-11-29 DIAGNOSIS — R079 Chest pain, unspecified: Secondary | ICD-10-CM

## 2011-11-30 ENCOUNTER — Ambulatory Visit (HOSPITAL_COMMUNITY): Payer: Medicare Other | Attending: Cardiovascular Disease | Admitting: Radiology

## 2011-11-30 VITALS — BP 127/74 | Ht 72.0 in | Wt 198.0 lb

## 2011-11-30 DIAGNOSIS — E785 Hyperlipidemia, unspecified: Secondary | ICD-10-CM | POA: Insufficient documentation

## 2011-11-30 DIAGNOSIS — Z8249 Family history of ischemic heart disease and other diseases of the circulatory system: Secondary | ICD-10-CM | POA: Insufficient documentation

## 2011-11-30 DIAGNOSIS — Z9861 Coronary angioplasty status: Secondary | ICD-10-CM | POA: Insufficient documentation

## 2011-11-30 DIAGNOSIS — I4891 Unspecified atrial fibrillation: Secondary | ICD-10-CM | POA: Insufficient documentation

## 2011-11-30 DIAGNOSIS — R079 Chest pain, unspecified: Secondary | ICD-10-CM | POA: Insufficient documentation

## 2011-11-30 DIAGNOSIS — Z87891 Personal history of nicotine dependence: Secondary | ICD-10-CM | POA: Insufficient documentation

## 2011-11-30 DIAGNOSIS — I2510 Atherosclerotic heart disease of native coronary artery without angina pectoris: Secondary | ICD-10-CM

## 2011-11-30 MED ORDER — TECHNETIUM TC 99M TETROFOSMIN IV KIT
11.0000 | PACK | Freq: Once | INTRAVENOUS | Status: AC | PRN
  Administered 2011-11-30: 11 via INTRAVENOUS

## 2011-11-30 MED ORDER — REGADENOSON 0.4 MG/5ML IV SOLN
0.4000 mg | Freq: Once | INTRAVENOUS | Status: AC
  Administered 2011-11-30: 0.4 mg via INTRAVENOUS

## 2011-11-30 MED ORDER — TECHNETIUM TC 99M TETROFOSMIN IV KIT
33.0000 | PACK | Freq: Once | INTRAVENOUS | Status: AC | PRN
  Administered 2011-11-30: 33 via INTRAVENOUS

## 2011-11-30 NOTE — Progress Notes (Signed)
[facility] SITE 3 NUCLEAR MED [address] [place] [address] [phone]  Cardiology Nuclear Med [name] [name] is a 71 y.o. [name] [id number] 24-Mar-1941   Nuclear Med Background Indication for Stress Test:  Evaluation for Ischemia, Stent Patency, PTCA Patency and Post Hospital 11/26/11 with CP and negative enzymes History: 4/08 Myocardial Infarction:NSTEMI of anterolateral and  treated with catherterization, Heart Catheterization-PTCA and Stent  X 3 of LAD, 2/10 Myocardial Perfusion Study-normal, EF=62%,'11 Echo: EF=55-60%,Mild LVH and mild MVR and Atrial Fib Cardiac Risk Factors: Family History - CAD, History of Smoking and Lipids  Symptoms:  Chest Pain and Chest tightness    Nuclear Pre-Procedure Caffeine/Decaff Intake:  None NPO After: 7:00am   Lungs:  clear IV 0.9% NS with Angio Cath:  20g  IV Site: R Hand  IV Started by:  Cathlyn Parsons, RN  Chest Size (in):  44 Cup Size: n/a  Height: 6' (1.829 m)  Weight:  198 lb (89.812 kg)  BMI:  Body mass index is 26.85 kg/(m^2). Tech Comments:  n/a    Nuclear Med Study 1 or 2 day study: 1 day  Stress Test Type:  Treadmill/Lexiscan  Reading MD: Charlton Haws, MD  Order Authorizing Provider:  Casimiro Needle Cooper,MD  Resting Radionuclide: Technetium 67m Tetrofosmin  Resting Radionuclide Dose: 11 mCi   Stress Radionuclide:  Technetium 1m Tetrofosmin  Stress Radionuclide Dose: 33 mCi           Stress Protocol Rest HR: 56 Stress HR: 90  Rest BP: 127/74 Stress BP: 149/80  Exercise Time (min): 2:00 METS: 1.7   Predicted Max HR: 148 bpm % Max HR: 60.81 bpm Rate Pressure Product: 21308   Dose of Adenosine (mg):  n/a Dose of Lexiscan: 0.4 mg  Dose of Atropine (mg): n/a Dose of Dobutamine: n/a mcg/kg/min (at max HR)  Stress Test Technologist: Cathlyn Parsons, RN  Nuclear Technologist:  Domenic Polite, CNMT     Rest Procedure:  Myocardial perfusion imaging was performed at rest 45 minutes following the  intravenous administration of Technetium 24m Tetrofosmin. Rest ECG: Atrial Fibrilliation  Stress Procedure:  The patient received IV Lexiscan 0.4 mg over 15-seconds with concurrent low level exercise and then Technetium 65m Tetrofosmin was injected at 30-seconds while the patient continued walking one more minute.  There were no significant changes with Lexiscan.  Quantitative spect images were obtained after a 45-minute delay. Stress ECG: No significant change from baseline ECG  QPS Raw Data Images:  Motion artifact on rest and stress Stress Images:  There is decreased uptake in the inferior wall. Rest Images:  There is decreased uptake in the inferior wall. Subtraction (SDS):  No evidence of ischemia. Transient Ischemic Dilatation (Normal <1.22):  .99 Lung/Heart Ratio (Normal <0.45):  .26  Quantitative Gated Spect Images QGS EDV:  118 ml QGS ESV:  50 ml QGS cine images:  NL LV Function; NL Wall Motion QGS EF: 58%  Impression Exercise Capacity:  Lexiscan with no exercise. BP Response:  Normal blood pressure response. Clinical Symptoms:  No chest pain. ECG Impression:  Baseline ECG is atrial fibrillation Comparison with Prior Nuclear Study: No images to compare  Overall Impression:  Low risk stress nuclear study.  Small inferior wall infarct at mid and basal level with no ischemia EF 58%     Charlton Haws

## 2012-02-07 ENCOUNTER — Other Ambulatory Visit: Payer: Self-pay | Admitting: Urology

## 2012-02-08 ENCOUNTER — Telehealth: Payer: Self-pay | Admitting: Cardiovascular Disease

## 2012-02-08 NOTE — Telephone Encounter (Signed)
This is okay with me as he is several years out from his last PCI procedure and he has been very stable. He should start aspirin back as soon as possible after the procedure.

## 2012-02-08 NOTE — Telephone Encounter (Signed)
New Msg: Pt calling wanting to speak with nurse/MD regarding pt needing to stop taking aspirin prior to pt procedure. Pt wants to get permission from MD to stop taking medication. Please return pt call to discuss further.

## 2012-02-08 NOTE — Telephone Encounter (Signed)
I left a detailed message on the pt's answering machine with Dr Earmon Phoenix response.

## 2012-02-08 NOTE — Telephone Encounter (Signed)
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  The pt is scheduled for a cystoscopy/retrograde/ureteroscopy on 02/15/12 with Dr Brunilda Payor. The pt needs to stop his ASA 5 days prior to procedure.  I will forward this message to Dr Excell Seltzer for review.  The pt said it is okay to leave a message on his answering message.

## 2012-02-09 ENCOUNTER — Encounter (HOSPITAL_COMMUNITY): Payer: Self-pay | Admitting: Pharmacy Technician

## 2012-02-10 ENCOUNTER — Encounter (HOSPITAL_COMMUNITY)
Admission: RE | Admit: 2012-02-10 | Discharge: 2012-02-10 | Disposition: A | Discharge status: Home / Self Care | Payer: Medicare Other | Source: Ambulatory Visit | Attending: Urology | Admitting: Urology

## 2012-02-10 ENCOUNTER — Encounter (HOSPITAL_COMMUNITY): Payer: Self-pay

## 2012-02-10 HISTORY — DX: Pneumonia, unspecified organism: J18.9

## 2012-02-10 HISTORY — DX: Acute myocardial infarction, unspecified: I21.9

## 2012-02-10 HISTORY — DX: Unspecified osteoarthritis, unspecified site: M19.90

## 2012-02-10 LAB — SURGICAL PCR SCREEN
MRSA, PCR: NEGATIVE
Staphylococcus aureus: NEGATIVE

## 2012-02-10 MED ORDER — CEFAZOLIN SODIUM 1-5 GM-% IV SOLN: 1.0000 g | INTRAVENOUS | Status: DC

## 2012-02-10 NOTE — Pre-Procedure Instructions (Signed)
ECHO 02/11/10 EPIC

## 2012-02-10 NOTE — Pre-Procedure Instructions (Signed)
EKG 11/26/11 EPIC  CXR 11/26/11 EPIC  Dr Excell Seltzer ( cardiologist ) LOV 10/12/20/10 EPIC

## 2012-02-10 NOTE — Patient Instructions (Signed)
[name] [name]  02/10/2012   Your procedure is scheduled on:  02/15/12 [id number]  Report to [facility] at 0630 AM.  Call this number if you have problems the morning of surgery: [phone]   Remember:   Do not eat food:After Midnight.  May have clear liquids:until Midnight .  Marland Kitchen  Take these medicines the morning of surgery with A SIP OF WATER:    Do not wear jewelry,   Do not wear lotions, powders, or perfumes.     Do not bring valuables to the hospital.  Contacts, dentures or bridgework may not be worn into surgery.      Patients discharged the day of surgery will not be allowed to drive home.  Name and phone number of your driver:   Special Instructions: CHG Shower Use Special Wash: 1/2 bottle night before surgery and 1/2 bottle morning of surgery. shower chin to toes with CHG.  Wash face and private parts with regular soap.    Please read over the following fact sheets that you were given: MRSA Information, coughing and deep breathing exercises, leg exercises

## 2012-02-10 NOTE — Pre-Procedure Instructions (Signed)
02/10/12 Left message on voice mail of Dr Madilyn Hook nurse of pt's contrast media allergy that required dialysis in 2008.  Note also on front of chart

## 2012-02-10 NOTE — Pre-Procedure Instructions (Signed)
12/02/11 Stress Test EPIC

## 2012-02-14 NOTE — H&P (Signed)
History of Present Illness  Mr [name] [name] returns for follow-up.  He still complains of right lower quadrant pain.  CT scan showed moderate to severe right hydronephrosis down to the mid ureter.  The left renal cyst has recurred.  There is no ureteral stone.  The hydronephrosis is new since CT scan of August 09.  He states that he had a JJ stent placement 20 years ago in [place] for hydronephrosis.  He has not had any flank pain.  He denies dysuria and hematuria.  I reviewed the CT scan with the patient and his wife.   Past Medical History Problems  1. History of  Acute Myocardial Infarction V12.59 2. History of  Atrial Fibrillation 427.31 3. History of  Chronic Renal Failure 585.9 4. History of  Hypercholesterolemia 272.0 5. History of  Hypertension 401.9  Surgical History Problems  1. History of  Heart Surgery  Current Meds 1. Aspirin 81 MG Oral Tablet; Therapy: (Recorded:20Oct2008) to 2. Atorvastatin Calcium 40 MG Oral Tablet; Therapy: 26Jun2012 to 3. Calcitriol 0.25 MCG Oral Capsule; Therapy: 22Mar2012 to 4. Furosemide 40 MG Oral Tablet; Therapy: (Recorded:20Oct2008) to 5. Multi-Vitamin TABS; Therapy: (Recorded:20Oct2008) to 6. NitroQuick 0.4 MG SUBL; Therapy: (Recorded:20Oct2008) to  Allergies Medication  1. Coumadin TABS Non-Medication  2. Contrast Dye  Family History Problems  1. Family history of  Diabetes Mellitus V18.0 2. Maternal history of  Family Health Status Number Of Children 3 daug 3. Maternal history of  Father Deceased At Age ____ 4. Paternal history of  Gastric Cancer V16.0 5. Paternal history of  Heart Disease V17.49 6. Maternal history of  Mother Deceased At Age ____ 7. Family history of  Prostate Cancer V16.42  Social History Problems  1. Caffeine Use 2. Marital History - Currently Married 3. Tobacco Use V15.82 Denied  4. History of  Alcohol Use  Review of Systems Genitourinary, constitutional, skin, eye, otolaryngeal, hematologic/lymphatic,  cardiovascular, pulmonary, endocrine, musculoskeletal, gastrointestinal, neurological and psychiatric system(s) were reviewed and pertinent findings if present are noted.    Physical Exam Constitutional: Well nourished and well developed . No acute distress.  ENT:. The ears and nose are normal in appearance.  Neck: The appearance of the neck is normal and no neck mass is present.  Pulmonary: No respiratory distress and normal respiratory rhythm and effort.  Cardiovascular: Heart rate and rhythm are normal . No peripheral edema.  Abdomen: The abdomen is soft and nontender. No masses are palpated. No CVA tenderness. No hernias are palpable. No hepatosplenomegaly noted.  Genitourinary: Examination of the penis demonstrates no discharge, no masses, no lesions and a normal meatus. The scrotum is without lesions. The right epididymis is palpably normal and non-tender. The left epididymis is palpably normal and non-tender. The right testis is non-tender and without masses. The left testis is non-tender and without masses.  Lymphatics: The femoral and inguinal nodes are not enlarged or tender.  Skin: Normal skin turgor, no visible rash and no visible skin lesions.  Neuro/Psych:. Mood and affect are appropriate.    Results/Data Urine [Data Includes: Last 1 Day]   21Mar2013  COLOR YELLOW   APPEARANCE CLEAR   SPECIFIC GRAVITY <1.005   pH 5.0   GLUCOSE NEG mg/dL  BILIRUBIN NEG   KETONE NEG mg/dL  BLOOD NEG   PROTEIN NEG mg/dL  UROBILINOGEN 0.2 mg/dL  NITRITE NEG   LEUKOCYTE ESTERASE NEG    Assessment Assessed  1. Hydronephrosis 591 2. Renal Cyst 593.2 3. Testicular Pain 608.9  Plan Health Maintenance (V70.0)  1. UA With REFLEX  Done: 21Mar2013 10:42AM Hydronephrosis (591)  2. Follow-up Schedule Surgery Office  Follow-up  Done: 21Mar2013   Needs cystoscopy, right retrograde pyelogram, ureteroscopy. The procedure, risks, benefits were explained to the patient and his wife.  The risks  include but are not limited to hemorrhage, infection, ureteral injury.  They understand and are agreeable.   Signatures Electronically signed by : Su Grand, M.D.; Feb 03 2012  6:32PM

## 2012-02-15 ENCOUNTER — Ambulatory Visit (HOSPITAL_COMMUNITY)
Admission: RE | Admit: 2012-02-15 | Discharge: 2012-02-15 | Disposition: A | Discharge status: Home / Self Care | Payer: Medicare Other | Source: Ambulatory Visit | Attending: Urology | Admitting: Urology

## 2012-02-15 ENCOUNTER — Encounter (HOSPITAL_COMMUNITY): Payer: Self-pay | Admitting: *Deleted

## 2012-02-15 ENCOUNTER — Encounter (HOSPITAL_COMMUNITY): Payer: Self-pay | Admitting: Registered Nurse

## 2012-02-15 ENCOUNTER — Encounter (HOSPITAL_COMMUNITY)
Admission: RE | Disposition: A | Discharge status: Home / Self Care | Payer: Self-pay | Source: Ambulatory Visit | Attending: Urology

## 2012-02-15 ENCOUNTER — Ambulatory Visit (HOSPITAL_COMMUNITY): Payer: Medicare Other | Admitting: Registered Nurse

## 2012-02-15 DIAGNOSIS — N509 Disorder of male genital organs, unspecified: Secondary | ICD-10-CM | POA: Insufficient documentation

## 2012-02-15 DIAGNOSIS — N1330 Unspecified hydronephrosis: Secondary | ICD-10-CM | POA: Insufficient documentation

## 2012-02-15 DIAGNOSIS — N189 Chronic kidney disease, unspecified: Secondary | ICD-10-CM | POA: Insufficient documentation

## 2012-02-15 DIAGNOSIS — Z01812 Encounter for preprocedural laboratory examination: Secondary | ICD-10-CM | POA: Insufficient documentation

## 2012-02-15 DIAGNOSIS — N135 Crossing vessel and stricture of ureter without hydronephrosis: Secondary | ICD-10-CM | POA: Insufficient documentation

## 2012-02-15 DIAGNOSIS — I129 Hypertensive chronic kidney disease with stage 1 through stage 4 chronic kidney disease, or unspecified chronic kidney disease: Secondary | ICD-10-CM | POA: Insufficient documentation

## 2012-02-15 DIAGNOSIS — I252 Old myocardial infarction: Secondary | ICD-10-CM | POA: Insufficient documentation

## 2012-02-15 DIAGNOSIS — E7800 Pure hypercholesterolemia, unspecified: Secondary | ICD-10-CM | POA: Insufficient documentation

## 2012-02-15 DIAGNOSIS — Z79899 Other long term (current) drug therapy: Secondary | ICD-10-CM | POA: Insufficient documentation

## 2012-02-15 DIAGNOSIS — I4891 Unspecified atrial fibrillation: Secondary | ICD-10-CM | POA: Insufficient documentation

## 2012-02-15 HISTORY — PX: CYSTOSCOPY/RETROGRADE/URETEROSCOPY: SHX5316

## 2012-02-15 SURGERY — CYSTOSCOPY/RETROGRADE/URETEROSCOPY
Anesthesia: General | Laterality: Right | Wound class: Clean Contaminated

## 2012-02-15 MED ORDER — HETASTARCH-ELECTROLYTES 6 % IV SOLN
INTRAVENOUS | Status: DC | PRN
  Administered 2012-02-15: 09:00:00 via INTRAVENOUS

## 2012-02-15 MED ORDER — FENTANYL CITRATE 0.05 MG/ML IJ SOLN
INTRAMUSCULAR | Status: DC | PRN
  Administered 2012-02-15: 50 ug via INTRAVENOUS
  Administered 2012-02-15: 100 ug via INTRAVENOUS

## 2012-02-15 MED ORDER — EPHEDRINE SULFATE 50 MG/ML IJ SOLN
INTRAMUSCULAR | Status: DC | PRN
  Administered 2012-02-15 (×3): 5 mg via INTRAVENOUS

## 2012-02-15 MED ORDER — SODIUM CHLORIDE 0.9 % IR SOLN
Status: DC | PRN
  Administered 2012-02-15: 3000 mL

## 2012-02-15 MED ORDER — HYDROCODONE-ACETAMINOPHEN 5-325 MG PO TABS: 1.0000 | ORAL_TABLET | ORAL | Status: AC | PRN

## 2012-02-15 MED ORDER — CEFAZOLIN SODIUM-DEXTROSE 2-3 GM-% IV SOLR
INTRAVENOUS | Status: AC
  Filled 2012-02-15: qty 50

## 2012-02-15 MED ORDER — MIDAZOLAM HCL 5 MG/5ML IJ SOLN
INTRAMUSCULAR | Status: DC | PRN
  Administered 2012-02-15: 2 mg via INTRAVENOUS

## 2012-02-15 MED ORDER — CEFAZOLIN SODIUM-DEXTROSE 2-3 GM-% IV SOLR
2.0000 g | Freq: Once | INTRAVENOUS | Status: AC
  Administered 2012-02-15: 2 g via INTRAVENOUS

## 2012-02-15 MED ORDER — IOHEXOL 300 MG/ML  SOLN
INTRAMUSCULAR | Status: AC
  Filled 2012-02-15: qty 1

## 2012-02-15 MED ORDER — LACTATED RINGERS IV SOLN
INTRAVENOUS | Status: DC | PRN
  Administered 2012-02-15: 08:00:00 via INTRAVENOUS

## 2012-02-15 MED ORDER — FENTANYL CITRATE 0.05 MG/ML IJ SOLN: 25.0000 ug | INTRAMUSCULAR | Status: DC | PRN

## 2012-02-15 MED ORDER — HYDROCODONE-ACETAMINOPHEN 5-325 MG PO TABS: 1.0000 | ORAL_TABLET | ORAL | Status: DC | PRN

## 2012-02-15 MED ORDER — ONDANSETRON HCL 4 MG/2ML IJ SOLN
INTRAMUSCULAR | Status: DC | PRN
  Administered 2012-02-15: 4 mg via INTRAVENOUS

## 2012-02-15 MED ORDER — ACETAMINOPHEN 10 MG/ML IV SOLN
INTRAVENOUS | Status: AC
  Filled 2012-02-15: qty 100

## 2012-02-15 MED ORDER — IOHEXOL 300 MG/ML  SOLN
INTRAMUSCULAR | Status: DC | PRN
  Administered 2012-02-15: 16 mL

## 2012-02-15 MED ORDER — LACTATED RINGERS IV SOLN: INTRAVENOUS | Status: DC

## 2012-02-15 MED ORDER — PHENYLEPHRINE HCL 10 MG/ML IJ SOLN
INTRAMUSCULAR | Status: DC | PRN
  Administered 2012-02-15 (×3): 80 ug via INTRAVENOUS

## 2012-02-15 MED ORDER — LIDOCAINE HCL (CARDIAC) 10 MG/ML IV SOLN
INTRAVENOUS | Status: DC | PRN
  Administered 2012-02-15: 50 mg via INTRAVENOUS

## 2012-02-15 MED ORDER — ACETAMINOPHEN 10 MG/ML IV SOLN
INTRAVENOUS | Status: DC | PRN
  Administered 2012-02-15: 1000 mg via INTRAVENOUS

## 2012-02-15 MED ORDER — PROPOFOL 10 MG/ML IV BOLUS
INTRAVENOUS | Status: DC | PRN
  Administered 2012-02-15: 130 mg via INTRAVENOUS

## 2012-02-15 SURGICAL SUPPLY — 24 items
ADAPTER CATH URET PLST 4-6FR (CATHETERS) ×2 IMPLANT
ADPR CATH URET STRL DISP 4-6FR (CATHETERS) ×1
BAG URO CATCHER STRL LF (DRAPE) ×2 IMPLANT
BASKET ZERO TIP NITINOL 2.4FR (BASKET) IMPLANT
BSKT STON RTRVL ZERO TP 2.4FR (BASKET)
CATH INTERMIT  6FR 70CM (CATHETERS) IMPLANT
CATH URET 5FR 28IN CONE TIP (BALLOONS) ×1
CATH URET 5FR 70CM CONE TIP (BALLOONS) IMPLANT
CLOTH BEACON ORANGE TIMEOUT ST (SAFETY) ×2 IMPLANT
DRAPE CAMERA CLOSED 9X96 (DRAPES) ×2 IMPLANT
GLOVE BIOGEL M 7.0 STRL (GLOVE) ×2 IMPLANT
GOWN STRL NON-REIN LRG LVL3 (GOWN DISPOSABLE) ×4 IMPLANT
GUIDEWIRE ANG ZIPWIRE 038X150 (WIRE) IMPLANT
GUIDEWIRE STR DUAL SENSOR (WIRE) ×1 IMPLANT
KIT BALLIN UROMAX 15FX10 (LABEL) IMPLANT
MANIFOLD NEPTUNE II (INSTRUMENTS) ×2 IMPLANT
MARKER SKIN DUAL TIP RULER LAB (MISCELLANEOUS) ×2 IMPLANT
PACK CYSTO (CUSTOM PROCEDURE TRAY) ×2 IMPLANT
SET HIGH PRES BAL DIL (LABEL) ×1
SHEATH URET ACCESS 12FR/35CM (UROLOGICAL SUPPLIES) ×1 IMPLANT
SYRINGE IRR TOOMEY STRL 70CC (SYRINGE) ×1 IMPLANT
TUBING CONNECTING 10 (TUBING) ×2 IMPLANT
UROMAX ULTRA BALLOON DILATION KIT IMPLANT
WIRE COONS/BENSON .038X145CM (WIRE) ×2 IMPLANT

## 2012-02-15 NOTE — Op Note (Signed)
[name] [name] is a 71 y.o.   02/15/2012  Pre Op Diagnosis: Right hydronephrosis, right ureteral stricture.  Post Op Diagnosis: Same  Procedure done: Cystoscopy, right retrograde pyelogram, ureteroscopy, balloon dilation of the ureteral stricture and insertion of double-J stent.  Surgeon: Wendie Simmer. [name] [name]  Anesthesia: Gen.  Indication: Patient is a 71 years old male who has been complaining of right testicular and right lower quadrant pain over the past 4 months. He was initially treated by his primary care physician for epididymitis. However he continued to complain of pain. CT scan showed severe right hydronephrosis down to the mid ureter; there is no evidence of ureteral stone. Patient reports that he had a right double-J stent placement 20 years ago for the same problem. The hydronephrosis is new since CT scan of August 2009. He is scheduled today for cystoscopy, right retrograde pyelogram, ureteroscopy and insertion of double-J stent.      Procedure: The patient was identified by his wrist band and proper timeout was taken.  Under general anesthesia he was prepped and draped and placed in the dorsolithotomy position. A panendoscope was inserted in the bladder. The anterior urethra is normal. He has trilobar prostatic hypertrophy. The bladder is moderately dilated with cellules. The ureteral orifices are in normal position and shape. There is no stone or tumor in the bladder.  Retrograde pyelogram:  A cone tip catheter was passed through the cystoscope and the right ureteral orifice. 10 cc of Omnipaque were instilled through the cone-tip catheter. The distal and mid ureter appear normal. There is a narrow stricture that measures about 2 cm long at the junction of the proximal and mid ureter, at the iliac crest. The ureter proximal to that stricture is markedly dilated. The cone-tip catheter was then removed.  A sensor wire was then passed through the cystoscope and the right ureter without  difficulty up to the renal pelvis. The bladder was then emptied and the cystoscope removed.  A semirigid ureteroscope was then passed in the bladder and the distal ureter. There is no evidence of stone or tumor in the ureter. I was not able to pass the ureteroscope through the stricture. I removed the ureteroscope and passed the inner sheath of the ureteroscope access sheath over the guidewire and dilated the stricture. I then inserted the ureteroscope in the ureter but I was still unable to pass the scope through the stricture. I then again attempted to dilate the stricture with the ureteroscope access sheath with both inner and outer sheaths. The ureteroscope access sheath could not go through the stricture. I removed the ureteroscope access sheath and then passed a ureteral balloon catheter over the sensor wire and dilated the stricture. Then the ureteroscope was reinserted in the ureter but I was still unable to pass the ureteroscope through the stricture. With the ureteroscope in the distal ureter, I injected contrast through the ureteroscope. Then the stricture was still present. The ureteroscope was then removed.  The sensor wire was then backloaded into the cystoscope and a 8 French-26 double-J stent was passed over the sensor wire. The sensor wire was then removed. The proximal curl of the double-J stent is in the renal pelvis; the distal curl is in the bladder. The bladder was then emptied. Several blood clots were irrigated out of the bladder with a Toomey syringe. The cystoscope was then removed.   The patient tolerated the procedure we and left the OR in satisfactory condition to postanesthesia care unit.  EBL: Minimal  CC:  Dr. Herb Grays

## 2012-02-15 NOTE — Anesthesia Preprocedure Evaluation (Addendum)
Anesthesia Evaluation  Patient identified by MRN, date of birth, ID band Patient awake    Reviewed: Allergy & Precautions, H&P , NPO status , Patient's Chart, lab work & pertinent test results  Airway Mallampati: II TM Distance: >3 FB Neck ROM: Full    Dental No notable dental hx.    Pulmonary pneumonia , former smoker breath sounds clear to auscultation  Pulmonary exam normal       Cardiovascular + CAD, + Past MI and + Cardiac Stents + dysrhythmias Atrial Fibrillation Rhythm:Regular Rate:Normal  Low-risk cardiolite 12/01/11   Neuro/Psych negative neurological ROS  negative psych ROS   GI/Hepatic negative GI ROS, Neg liver ROS,   Endo/Other  negative endocrine ROS  Renal/GU CRFRenal disease  negative genitourinary   Musculoskeletal negative musculoskeletal ROS (+)   Abdominal   Peds negative pediatric ROS (+)  Hematology negative hematology ROS (+)   Anesthesia Other Findings   Reproductive/Obstetrics negative OB ROS                          Anesthesia Physical Anesthesia Plan  ASA: III  Anesthesia Plan: General   Post-op Pain Management:    Induction: Intravenous  Airway Management Planned: LMA  Additional Equipment:   Intra-op Plan:   Post-operative Plan: Extubation in OR  Informed Consent: I have reviewed the patients History and Physical, chart, labs and discussed the procedure including the risks, benefits and alternatives for the proposed anesthesia with the patient or authorized representative who has indicated his/her understanding and acceptance.   Dental advisory given  Plan Discussed with: CRNA  Anesthesia Plan Comments:         Anesthesia Quick Evaluation

## 2012-02-15 NOTE — Discharge Instructions (Signed)
Ureteral Stent  A ureteral stent is a soft plastic tube with multiple holes. The stent is inserted into a ureter to help drain urine from the kidney into the bladder. The tube has a coil on each end to keep it from falling out. One end stays in the kidney. The other end stays in the bladder. A stent cannot be seen from the outside. Usually it does not keep you from going about normal routines.  A ureteral stent is used to bypass a blockage in your kidney or ureter. This blockage can be caused by kidney stones, scar tissue, pregnancy, or other causes. It can also be used during treatment to remove a kidney stone or to let a ureter heal after surgery. The stent allows urine to drain from the kidney into the bladder. It is most often taken out after the blockage has been removed or the ureter has healed. If a stent is needed for a long time, it will be changed every few months.  INSERTING THE STENT  Your stent is put in by a urologist. This is a medical doctor trained for treating genitourinary (kidney, ureter and bladder) problems. Before your stent is put in, your caregiver may order x-rays or other imaging tests of your kidneys and ureters. The stent is inserted in a hospital or same day surgical center. You can anticipate going home the same day.  PROCEDURE   A special x-ray machine called a fluoroscope is used to guide the insertion of your stent. This allows your doctor to make sure the stent is in the correct place.   First you are given anesthesia to keep you comfortable.   Then your doctor inserts a special lighted instrument called a cystoscope into your bladder. This allows your doctor to see the opening to the ureter.   A thin wire is carefully threaded into the bladder and up the ureter. The stent is inserted over the wire and the wire is then removed.  HOME CARE INSTRUCTIONS    While the stent is in place, you may feel some discomfort. Certain movements may trigger pain or a feeling that you need to  urinate. Your caregiver may give you pain medication. Only take over-the-counter or prescription medicines for pain, discomfort, or fever as directed by your caregiver. Do not take aspirin as this can make bleeding worse.   You may be given medications to prevent infection or bladder spasms. Be sure to take all medications as directed.   Drink plenty of fluids.   You may have small amounts of bleeding causing your urine to be slightly red. This is nothing to be concerned about.  REMOVAL OF THE STENT  Your stent is left in until the blockage is resolved. This may take two weeks or longer. Before the stent is removed, you may have an x-ray make sure the ureter is open. The stent can be removed by your caregiver in the office. Medications may be given for comfort. Be sure to keep all follow-up appointments so your caregiver can check that you are healing properly.  SEEK IMMEDIATE MEDICAL CARE IF:    Your urine is dark red or has blood clots.   You are incontinent (leaking urine).   You have an oral temperature above 102 F (38.9 C), chills, nausea (feeling sick to your stomach), or vomiting.   Your pain is not relieved by pain medication. Do not take aspirin as this can make bleeding worse.   The end of the stent comes   out of the urethra.  Document Released: 10/29/2000 Document Revised: 10/21/2011 Document Reviewed: 10/28/2008  [employer] Patient Information 2012 [employer].

## 2012-02-15 NOTE — Anesthesia Postprocedure Evaluation (Signed)
  Anesthesia Post-op Note  Patient: [name] [name]  Procedure(s) Performed: Procedure(s) (LRB): CYSTOSCOPY/RETROGRADE/URETEROSCOPY (Right) CYSTOSCOPY WITH STENT PLACEMENT (Right)  Patient Location: PACU  Anesthesia Type: General  Level of Consciousness: awake and alert   Airway and Oxygen Therapy: Patient Spontanous Breathing  Post-op Pain: mild  Post-op Assessment: Post-op Vital signs reviewed, Patient's Cardiovascular Status Stable, Respiratory Function Stable, Patent Airway and No signs of Nausea or vomiting  Post-op Vital Signs: stable  Complications: No apparent anesthesia complications

## 2012-02-15 NOTE — Transfer of Care (Signed)
Immediate Anesthesia Transfer of Care Note  Patient: [name] [name]  Procedure(s) Performed: Procedure(s) (LRB): CYSTOSCOPY/RETROGRADE/URETEROSCOPY (Right) CYSTOSCOPY WITH STENT PLACEMENT (Right)  Patient Location: PACU  Anesthesia Type: General  Level of Consciousness: awake, patient cooperative and responds to stimulation  Airway & Oxygen Therapy: Patient Spontanous Breathing and Patient connected to face mask oxygen  Post-op Assessment: Report given to PACU RN and Post -op Vital signs reviewed and stable  Post vital signs: stable  Complications: No apparent anesthesia complications

## 2012-02-24 ENCOUNTER — Encounter (HOSPITAL_COMMUNITY): Payer: Self-pay | Admitting: Urology

## 2012-03-14 ENCOUNTER — Other Ambulatory Visit (HOSPITAL_COMMUNITY): Payer: Self-pay | Admitting: Urology

## 2012-03-14 DIAGNOSIS — N135 Crossing vessel and stricture of ureter without hydronephrosis: Secondary | ICD-10-CM

## 2012-03-15 ENCOUNTER — Other Ambulatory Visit: Payer: Self-pay | Admitting: Urology

## 2012-03-16 ENCOUNTER — Encounter (HOSPITAL_COMMUNITY): Payer: Self-pay | Admitting: *Deleted

## 2012-03-16 ENCOUNTER — Encounter (HOSPITAL_COMMUNITY)
Admission: RE | Admit: 2012-03-16 | Discharge: 2012-03-16 | Disposition: A | Discharge status: Home / Self Care | Payer: Medicare Other | Source: Ambulatory Visit | Attending: Urology | Admitting: Urology

## 2012-03-16 ENCOUNTER — Telehealth: Payer: Self-pay | Admitting: Cardiovascular Disease

## 2012-03-16 ENCOUNTER — Encounter (HOSPITAL_COMMUNITY): Payer: Self-pay | Admitting: Pharmacy Technician

## 2012-03-16 DIAGNOSIS — N281 Cyst of kidney, acquired: Secondary | ICD-10-CM | POA: Insufficient documentation

## 2012-03-16 DIAGNOSIS — N135 Crossing vessel and stricture of ureter without hydronephrosis: Secondary | ICD-10-CM | POA: Insufficient documentation

## 2012-03-16 MED ORDER — TECHNETIUM TC 99M MERTIATIDE
15.0000 | Freq: Once | INTRAVENOUS | Status: AC | PRN
  Administered 2012-03-16: 15 via INTRAVENOUS

## 2012-03-16 NOTE — Telephone Encounter (Signed)
The pt is scheduled for another cystoscopy on 03/23/12.  The pt would like to know if he can hold ASA again 5 days prior to procedure. I made the pt aware that this would be okay and to resume ASAP after procedure. Pt agreed with plan.

## 2012-03-16 NOTE — Telephone Encounter (Signed)
Please return call to patient regarding upcoming procedure and medications hold.  He can be reached at [phone].

## 2012-03-16 NOTE — Telephone Encounter (Signed)
Pt rtn call 

## 2012-03-16 NOTE — Telephone Encounter (Signed)
Left message to call back  

## 2012-03-22 NOTE — Anesthesia Preprocedure Evaluation (Addendum)
Anesthesia Evaluation  Patient identified by MRN, date of birth, ID band Patient awake    Reviewed: Allergy & Precautions, H&P , NPO status , Patient's Chart, lab work & pertinent test results  Airway Mallampati: II TM Distance: >3 FB Neck ROM: full    Dental No notable dental hx. (+) Teeth Intact and Dental Advisory Given   Pulmonary neg pulmonary ROS,  breath sounds clear to auscultation  Pulmonary exam normal       Cardiovascular Exercise Tolerance: Good + CAD and + Past MI negative cardio ROS  + dysrhythmias Atrial Fibrillation Rhythm:Irregular Rate:Normal  S/p NSTEMI 4/08. Drug eluting stent 4/08. Chronic A fib.   Neuro/Psych negative neurological ROS  negative psych ROS   GI/Hepatic negative GI ROS, Neg liver ROS,   Endo/Other  negative endocrine ROS  Renal/GU ARF and Renal InsufficiencyRenal diseasenegative Renal ROSHx. ARF with contrast.  Chronic renal insufficiency history but numbers are good today.  negative genitourinary   Musculoskeletal   Abdominal   Peds  Hematology negative hematology ROS (+)   Anesthesia Other Findings   Reproductive/Obstetrics negative OB ROS                          Anesthesia Physical Anesthesia Plan  ASA: III  Anesthesia Plan: General   Post-op Pain Management:    Induction: Intravenous  Airway Management Planned: LMA  Additional Equipment:   Intra-op Plan:   Post-operative Plan:   Informed Consent: I have reviewed the patients History and Physical, chart, labs and discussed the procedure including the risks, benefits and alternatives for the proposed anesthesia with the patient or authorized representative who has indicated his/her understanding and acceptance.   Dental Advisory Given  Plan Discussed with: CRNA and Surgeon  Anesthesia Plan Comments:         Anesthesia Quick Evaluation

## 2012-03-23 ENCOUNTER — Encounter (HOSPITAL_COMMUNITY): Payer: Self-pay | Admitting: Anesthesiology

## 2012-03-23 ENCOUNTER — Encounter (HOSPITAL_COMMUNITY): Payer: Self-pay | Admitting: *Deleted

## 2012-03-23 ENCOUNTER — Ambulatory Visit (HOSPITAL_COMMUNITY): Payer: Medicare Other | Admitting: Anesthesiology

## 2012-03-23 ENCOUNTER — Encounter (HOSPITAL_COMMUNITY)
Admission: RE | Disposition: A | Discharge status: Home / Self Care | Payer: Self-pay | Source: Ambulatory Visit | Attending: Urology

## 2012-03-23 ENCOUNTER — Ambulatory Visit (HOSPITAL_COMMUNITY)
Admission: RE | Admit: 2012-03-23 | Discharge: 2012-03-23 | Disposition: A | Discharge status: Home / Self Care | Payer: Medicare Other | Source: Ambulatory Visit | Attending: Urology | Admitting: Urology

## 2012-03-23 DIAGNOSIS — E7800 Pure hypercholesterolemia, unspecified: Secondary | ICD-10-CM | POA: Insufficient documentation

## 2012-03-23 DIAGNOSIS — I10 Essential (primary) hypertension: Secondary | ICD-10-CM | POA: Insufficient documentation

## 2012-03-23 DIAGNOSIS — C669 Malignant neoplasm of unspecified ureter: Secondary | ICD-10-CM | POA: Insufficient documentation

## 2012-03-23 DIAGNOSIS — N135 Crossing vessel and stricture of ureter without hydronephrosis: Secondary | ICD-10-CM | POA: Insufficient documentation

## 2012-03-23 DIAGNOSIS — N1330 Unspecified hydronephrosis: Secondary | ICD-10-CM | POA: Insufficient documentation

## 2012-03-23 DIAGNOSIS — Z7982 Long term (current) use of aspirin: Secondary | ICD-10-CM | POA: Insufficient documentation

## 2012-03-23 DIAGNOSIS — I252 Old myocardial infarction: Secondary | ICD-10-CM | POA: Insufficient documentation

## 2012-03-23 DIAGNOSIS — Z79899 Other long term (current) drug therapy: Secondary | ICD-10-CM | POA: Insufficient documentation

## 2012-03-23 HISTORY — PX: CYSTOSCOPY WITH URETEROSCOPY: SHX5123

## 2012-03-23 HISTORY — PX: CYSTOSCOPY WITH BIOPSY: SHX5122

## 2012-03-23 LAB — SURGICAL PCR SCREEN
MRSA, PCR: NEGATIVE
Staphylococcus aureus: NEGATIVE

## 2012-03-23 LAB — BASIC METABOLIC PANEL
CO2: 26 mEq/L (ref 19–32)
Calcium: 8.7 mg/dL (ref 8.4–10.5)
Chloride: 103 mEq/L (ref 96–112)
Potassium: 3.8 mEq/L (ref 3.5–5.1)
Sodium: 139 mEq/L (ref 135–145)

## 2012-03-23 LAB — CBC
Hemoglobin: 13.3 g/dL (ref 13.0–17.0)
MCH: 30.5 pg (ref 26.0–34.0)
Platelets: 178 10*3/uL (ref 150–400)
RBC: 4.36 MIL/uL (ref 4.22–5.81)
WBC: 7.5 10*3/uL (ref 4.0–10.5)

## 2012-03-23 SURGERY — CYSTOSCOPY WITH URETEROSCOPY
Anesthesia: General | Laterality: Right | Wound class: Clean Contaminated

## 2012-03-23 MED ORDER — FENTANYL CITRATE 0.05 MG/ML IJ SOLN: 25.0000 ug | INTRAMUSCULAR | Status: DC | PRN

## 2012-03-23 MED ORDER — LACTATED RINGERS IV SOLN
INTRAVENOUS | Status: DC | PRN
  Administered 2012-03-23 (×2): via INTRAVENOUS

## 2012-03-23 MED ORDER — ONDANSETRON HCL 4 MG/2ML IJ SOLN
INTRAMUSCULAR | Status: DC | PRN
  Administered 2012-03-23: 4 mg via INTRAVENOUS

## 2012-03-23 MED ORDER — ACETAMINOPHEN 10 MG/ML IV SOLN
INTRAVENOUS | Status: AC
  Filled 2012-03-23: qty 100

## 2012-03-23 MED ORDER — MIDAZOLAM HCL 5 MG/5ML IJ SOLN
INTRAMUSCULAR | Status: DC | PRN
  Administered 2012-03-23: 2 mg via INTRAVENOUS

## 2012-03-23 MED ORDER — CIPROFLOXACIN IN D5W 400 MG/200ML IV SOLN
400.0000 mg | INTRAVENOUS | Status: AC
  Administered 2012-03-23: 400 mg via INTRAVENOUS

## 2012-03-23 MED ORDER — PROPOFOL 10 MG/ML IV BOLUS
INTRAVENOUS | Status: DC | PRN
  Administered 2012-03-23: 200 mg via INTRAVENOUS

## 2012-03-23 MED ORDER — ACETAMINOPHEN 10 MG/ML IV SOLN
INTRAVENOUS | Status: DC | PRN
  Administered 2012-03-23: 1000 mg via INTRAVENOUS

## 2012-03-23 MED ORDER — FENTANYL CITRATE 0.05 MG/ML IJ SOLN
INTRAMUSCULAR | Status: DC | PRN
  Administered 2012-03-23 (×3): 50 ug via INTRAVENOUS

## 2012-03-23 MED ORDER — IOHEXOL 300 MG/ML  SOLN
INTRAMUSCULAR | Status: AC
  Filled 2012-03-23: qty 1

## 2012-03-23 MED ORDER — LIDOCAINE HCL (CARDIAC) 10 MG/ML IV SOLN
INTRAVENOUS | Status: DC | PRN
  Administered 2012-03-23: 40 mg via INTRAVENOUS

## 2012-03-23 MED ORDER — LACTATED RINGERS IV SOLN: INTRAVENOUS | Status: DC

## 2012-03-23 MED ORDER — SODIUM CHLORIDE 0.9 % IR SOLN
Status: DC | PRN
  Administered 2012-03-23: 3000 mL

## 2012-03-23 MED ORDER — CEFAZOLIN SODIUM 1-5 GM-% IV SOLN
INTRAVENOUS | Status: AC
  Filled 2012-03-23: qty 50

## 2012-03-23 MED ORDER — IOHEXOL 300 MG/ML  SOLN
INTRAMUSCULAR | Status: DC | PRN
  Administered 2012-03-23: 50 mL

## 2012-03-23 MED ORDER — CIPROFLOXACIN IN D5W 400 MG/200ML IV SOLN
INTRAVENOUS | Status: AC
  Filled 2012-03-23: qty 200

## 2012-03-23 MED ORDER — CIPROFLOXACIN HCL 500 MG PO TABS: 500.0000 mg | ORAL_TABLET | Freq: Two times a day (BID) | ORAL | Status: AC

## 2012-03-23 MED ORDER — HYDROCODONE-ACETAMINOPHEN 5-300 MG PO TABS: 1.0000 | ORAL_TABLET | Freq: Four times a day (QID) | ORAL | Status: DC

## 2012-03-23 MED ORDER — MUPIROCIN 2 % EX OINT
TOPICAL_OINTMENT | CUTANEOUS | Status: AC
  Filled 2012-03-23: qty 22

## 2012-03-23 SURGICAL SUPPLY — 22 items
ADAPTER CATH URET PLST 4-6FR (CATHETERS) ×2 IMPLANT
ADPR CATH URET STRL DISP 4-6FR (CATHETERS) ×1
BAG URO CATCHER STRL LF (DRAPE) ×2 IMPLANT
BRUSH URET BIOPSY 3F (UROLOGICAL SUPPLIES) ×1 IMPLANT
CATH INTERMIT  6FR 70CM (CATHETERS) ×2 IMPLANT
CATH ROBINSON RED A/P 16FR (CATHETERS) IMPLANT
CLOTH BEACON ORANGE TIMEOUT ST (SAFETY) ×2 IMPLANT
DRAPE CAMERA CLOSED 9X96 (DRAPES) ×2 IMPLANT
ELECT REM PT RETURN 9FT ADLT (ELECTROSURGICAL) ×2
ELECTRODE REM PT RTRN 9FT ADLT (ELECTROSURGICAL) ×1 IMPLANT
GLOVE BIOGEL M STRL SZ7.5 (GLOVE) ×2 IMPLANT
GOWN PREVENTION PLUS XLARGE (GOWN DISPOSABLE) ×2 IMPLANT
GOWN STRL NON-REIN LRG LVL3 (GOWN DISPOSABLE) ×4 IMPLANT
MANIFOLD NEPTUNE II (INSTRUMENTS) ×2 IMPLANT
NDL SAFETY ECLIPSE 18X1.5 (NEEDLE) IMPLANT
NEEDLE HYPO 18GX1.5 SHARP (NEEDLE)
NEEDLE HYPO 22GX1.5 SAFETY (NEEDLE) IMPLANT
PACK CYSTO (CUSTOM PROCEDURE TRAY) ×2 IMPLANT
STENT CONTOUR 6FRX24X.038 (STENTS) ×1 IMPLANT
TUBING CONNECTING 10 (TUBING) ×2 IMPLANT
WATER STERILE IRR 3000ML UROMA (IV SOLUTION) ×2 IMPLANT
WIRE COONS/BENSON .038X145CM (WIRE) ×2 IMPLANT

## 2012-03-23 NOTE — Op Note (Signed)
Preoperative diagnosis: Right ureteral stricture  Postoperative diagnosis: Right ureteral stricture  Procedure:  1. Cystoscopy 2. Right ureteroscopy 3. Brush biopsy of right ureter 4. Right ureteral stent placement (6 x 24) 5. Right retrograde pyelography with interpretation  Surgeon: Moody Bruins. M.D.  Anesthesia: General  Complications: None  Intraoperative findings: Right retrograde pyelography demonstrated a 1.5 cm stricture in the distal aspect of the mid right ureter. No other filling defects or abnormalities were identified in the renal collecting system or ureter other than expected edematous changes associated with his previous indwelling stent.  EBL: Minimal  Specimens: 1. Brush biopsy of right ureteral stricture  Disposition of specimens: Pathology  Indication: [name] [name] is a 71 y.o. year old patient with a well functioning right renal unit and a right ureteral stricture causing obstruction and hydronephrosis. After reviewing the management options for treatment, the patient elected to proceed with the above surgical procedure(s). We have discussed the potential benefits and risks of the procedure, side effects of the proposed treatment, the likelihood of the patient achieving the goals of the procedure, and any potential problems that might occur during the procedure or recuperation. Informed consent has been obtained.  Description of procedure:  The patient was taken to the operating room and general anesthesia was induced.  The patient was placed in the dorsal lithotomy position, prepped and draped in the usual sterile fashion, and preoperative antibiotics were administered. A preoperative time-out was performed.   Cystourethroscopy was performed.  The patient's urethra was examined and demonstrated bilobar prostatic hypertrophy. The bladder was then systematically examined in its entirety. There was no evidence for any bladder tumors, stones, or other mucosal  pathology.  The right ureteral stent was identified.  The stent was brought out the urethra with the flexible graspers and a 0.38 sensor guidewire was then advanced up the stent into the right ureter and into the renal pelvis under fluoroscopic guidance. The 6 Fr semirigid ureteroscope was then advanced into the ureter next to the guidewire. There appeared to be a short strictured area in the distal aspect of the mid ureter with edematous changes consistent with his indwelling stent but no obvious tumor or other abnormality. The ureteroscope was able to be advanced through this area into the proximal ureter without additional abnormalities noted.  A brush biopsy was then obtained from the strictured site and the ureteroscope withdrawn.  The right ureteral wire was then used to place a ureteral catheter up to the level of the stricture.  Omnipaque contrast was injected through the ureteral catheter and a retrograde pyelogram was performed with findings as dictated above. The ureter appeared completely obstructed below the level of the stricture with no contrast extending proximal to the lower aspect of the stricture. However, it was known that there was not complete obstruction based on the ureteroscopic findings. The wire was then replaced and the ureter catheter advanced over the wire more proximally.  Contrast was again injected and the strictured area appeared a little longer than thought based on ureteroscopy possibly measuring 1.5 to 2 cm.  It was felt that the retrograde findings might be somewhat unreliable due to the preexisting edema from the stent but certainly there was concern that the stricture was longer than initially thought. The wire was replaced.  The wire was then backloaded through the cystoscope and a ureteral stent was advance over the wire using Seldinger technique.  The stent was positioned appropriately under fluoroscopic and cystoscopic guidance.  The wire was  then removed with an  adequate stent curl noted in the renal pelvis as well as in the bladder.  The bladder was then emptied and the procedure ended.  The patient appeared to tolerate the procedure well and without complications.  The patient was able to be awakened and transferred to the recovery unit in satisfactory condition.

## 2012-03-23 NOTE — Interval H&P Note (Signed)
History and Physical Interval Note:  03/23/2012 7:36 AM  [name] [name]  has presented today for surgery, with the diagnosis of Right Hydronephrosis, Right Ureteral Stricture  The various methods of treatment have been discussed with the patient and family. After consideration of risks, benefits and other options for treatment, the patient has consented to  Procedure(s) (LRB): CYSTOSCOPY WITH URETEROSCOPY (Right) CYSTOSCOPY WITH BIOPSY (Right) as a surgical intervention .  The patients' history has been reviewed, patient examined, no change in status, stable for surgery.  I have reviewed the patients' chart and labs.  Questions were answered to the patient's satisfaction.     Monnie Gudgel,LES

## 2012-03-23 NOTE — Transfer of Care (Signed)
Immediate Anesthesia Transfer of Care Note  Patient: [name] [name]  Procedure(s) Performed: Procedure(s) (LRB): CYSTOSCOPY WITH URETEROSCOPY (Right) CYSTOSCOPY WITH BIOPSY (Right)  Patient Location: PACU  Anesthesia Type: General  Level of Consciousness: awake and patient cooperative  Airway & Oxygen Therapy: Patient Spontanous Breathing and Patient connected to face mask oxygen  Post-op Assessment: Report given to PACU RN, Post -op Vital signs reviewed and stable and Patient moving all extremities X 4  Post vital signs: stable  Complications: No apparent anesthesia complications

## 2012-03-23 NOTE — Discharge Instructions (Signed)

## 2012-03-23 NOTE — Anesthesia Postprocedure Evaluation (Signed)
  Anesthesia Post-op Note  Patient: [name] [name]  Procedure(s) Performed: Procedure(s) (LRB): CYSTOSCOPY WITH URETEROSCOPY (Right) CYSTOSCOPY WITH BIOPSY (Right)  Patient Location: PACU  Anesthesia Type: General  Level of Consciousness: awake and alert   Airway and Oxygen Therapy: Patient Spontanous Breathing  Post-op Pain: mild  Post-op Assessment: Post-op Vital signs reviewed, Patient's Cardiovascular Status Stable, Respiratory Function Stable, Patent Airway and No signs of Nausea or vomiting  Post-op Vital Signs: stable  Complications: No apparent anesthesia complications

## 2012-03-23 NOTE — H&P (Signed)
Chief Complaint  Right hydronephrosis and right ureteral stricture   Reason For Visit  Reason for consult: To consider minimally invasive surgical reconstruction for his right mid ureteral stricture Physician requesting consult: Dr. Su Grand PCP: Dr. Herb Grays Nephrologist: Dr. Fayrene Fearing Deterding   History of Present Illness  Mr. [name] [name] is a 71 year old with the following urologic history:  1) Right hydronephrosis / right ureteral stricture: He apparently has a remote history of right hydronephrosis and had a ureteral stent placed in [place] 20 years ago which was subsequently removed. He had a CT scan in 2009 which did not demonstrate any right hydronephrosis or hydroureter. He had a CT scan performed in March 2013 for other reasons which demonstrated severe right hydroureteronephrosis with dilation down to a distinct transition point in the mid ureter. This was a change from his prior scan in 2009. No definite mass or extrinsic cause for this finding was present.  He was taken to the OR by Dr. Brunilda Payor on 02/15/12 and retrograde pyelography was consistent with the CT findings of a short narrowed stricture with no definite filling defects.  An attempt to perform ureteroscopy was unsuccessful and a ureteral stent was placed. He has been asymptomatic.  2) Left renal cyst: He has a history of a large left renal cyst and underwent percutaneous aspiration and ethanol sclerosis in 2009.  This cyst was noted to have reaccumulated on imaging in March 2013.  3) R testicular pain: He has a history of right lower quadrant and right testicular pain which had improved after treatment with doxycycline in 2013.  He follows up today for a consultation to discuss further evaluation of what appears to be a short ureteral stricture in the mid right ureter and specifically to potentially consider a minimally invasive surgical approach. He states that he was found to have a blockage of the right kidney that required  temporary ureteral stent placement 20 years ago in [place] and that this stent was then removed one month later without any further sequela. He had no hydronephrosis noted on his CT scan in 2009 and was asymptomatic on his hydronephrosis was identified in March 2013. He denies a history of stones or hematuria. He has had this intermittently present right-sided testicular pain which began in December and has improved after treatment with doxycycline although continues to persist to some degree. He does have some radiation up into his right inguinal region. He denies any flank pain. He does have a known large left renal cyst which apparently reoccurred after percutaneous drainage a few years ago. He is completely asymptomatic and denies any flank pain on the left side. He also notes a history of contrast-induced acute renal failure which resolved after his cardiac catheterization in the past. He does not have a contrast allergy.  He sees Dr. Tonny Bollman for followup of his cardiac issues including a history of myocardial infarction and atrial fibrillation. He does have 3 cardiac stents and does take aspirin 325 mg daily. He is not on anticoagulation due to the fact that it was very difficult to regulate his INR on therapy. Of note, he does not have a true allergy to Coumadin. He did present earlier in 2013 to the emergency department with complaints of chest pain. This was felt to be related to indigestion. A subsequent stress test was apparently negative for inducible ischemia. He did smoke 2 packs per day for 10 years but quit approximately 30 years ago.  Past Medical History Problems  1. History of  Acute Myocardial Infarction V12.59 2. History of  Atrial Fibrillation 427.31 3. History of  Hypercholesterolemia 272.0 4. History of  Hypertension 401.9  Surgical History Problems  1. History of  Cath Stent Placement 2. History of  Cystoscopy With Insertion Of Ureteral Stent Right 3. History of   Cystourethroscopy W/ Ureteroscopy W/ Tx Of Ureteral Strict  Current Meds 1. Aspirin 81 MG Oral Tablet; Therapy: (Recorded:20Oct2008) to 2. Atorvastatin Calcium 40 MG Oral Tablet; Therapy: 26Jun2012 to 3. Calcitriol 0.25 MCG Oral Capsule; Therapy: 22Mar2012 to 4. Fish Oil CPDR; Therapy: (Recorded:30Apr2013) to 5. Furosemide 40 MG Oral Tablet; Therapy: (Recorded:20Oct2008) to 6. Multi-Vitamin TABS; Therapy: (Recorded:20Oct2008) to 7. Niacin TABS; Therapy: (Recorded:30Apr2013) to 8. NitroQuick 0.4 MG SUBL; Therapy: (Recorded:20Oct2008) to  Allergies  He has no allergy to contrast but did have contrast induced nephropathy in the past.   Family History Problems  1. Family history of  Diabetes Mellitus V18.0 2. Paternal history of  Gastric Cancer V16.0 3. Paternal history of  Heart Disease V17.49 4. Family history of  Prostate Cancer V16.42  Social History Problems    Marital History - Currently Married   Tobacco Use V15.82 Denied    History of  Alcohol Use  Review of Systems Constitutional, skin, eye, otolaryngeal, hematologic/lymphatic, cardiovascular, pulmonary, endocrine, musculoskeletal, gastrointestinal, neurological and psychiatric system(s) were reviewed and pertinent findings if present are noted.    Vitals Vital Signs [Data Includes: Last 1 Day]  30Apr2013 08:49AM  BMI Calculated: 20.78 BSA Calculated: 2.38 Height: 6 ft 11 in Weight: 204 lb  Blood Pressure: 113 / 71 Heart Rate: 85  Physical Exam Constitutional: Well nourished and well developed . No acute distress.  ENT:. The ears and nose are normal in appearance.  Neck: The appearance of the neck is normal and no neck mass is present.  Pulmonary: No respiratory distress, normal respiratory rhythm and effort and clear bilateral breath sounds.  Cardiovascular: Heart rate and rhythm are normal . No peripheral edema.  Abdomen: The abdomen is soft and nontender. No masses are palpated. No CVA tenderness. No hernias  are palpable. No hepatosplenomegaly noted.  Genitourinary: Examination of the penis demonstrates no discharge, no masses, no lesions and a normal meatus. The scrotum is without lesions. The right epididymis is palpably normal and non-tender. The left epididymis is palpably normal and non-tender. The right testis is non-tender and without masses. The left testis is non-tender and without masses.  Lymphatics: The supraclavicular, femoral and inguinal nodes are not enlarged or tender.  Skin: Normal skin turgor, no visible rash and no visible skin lesions.  Neuro/Psych:. Mood and affect are appropriate.    Results/Data Urine [Data Includes: Last 1 Day]   30Apr2013  COLOR YELLOW   APPEARANCE CLEAR   SPECIFIC GRAVITY 1.010   pH 5.5   GLUCOSE NEG mg/dL  BILIRUBIN NEG   KETONE NEG mg/dL  BLOOD LARGE   PROTEIN TRACE mg/dL  UROBILINOGEN 0.2 mg/dL  NITRITE NEG   LEUKOCYTE ESTERASE SMALL   SQUAMOUS EPITHELIAL/HPF NONE SEEN   WBC 0-3 WBC/hpf  RBC 0-3 RBC/hpf  BACTERIA RARE   CRYSTALS NONE SEEN   CASTS NONE SEEN   Selected Results  CT-ABD/PELVIS W/W/O CONTRAST1 19Mar2013 12:00AM1 [place] Baseman   Test Name Result Flag Reference  ** RADIOLOGY REPORT BY [facility] ** ORIGINAL APPROVED BY: Charline Bills, M.D. ON: 02/01/2012 11:34:14   *RADIOLOGY REPORT*  Clinical Data: Right testicular pain, follow up left renal cyst  CT ABDOMEN AND PELVIS WITHOUT AND WITH CONTRAST  Technique: Multidetector CT imaging of the abdomen and pelvis was performed without contrast material in one or both body regions, followed by contrast material(s) and further sections in one or both body regions.  Contrast: 125 ml Isovue 300 IV  Comparison: [place] procedure CT dated 07/15/2008, [facility] CT abdomen/pelvis dated 03/07/2007  Findings: Lung bases are essentially clear.  Liver, spleen, pancreas, and adrenal gland are within normal limits. Mild nodularity of the right adrenal  gland.  Layering gallstones, without associated inflammatory changes. No intrahepatic or extrahepatic ductal dilatation.  Reaccumulation of the left lower pole cyst, now measuring 9.3 x 8.9 x 8.5 cm (series 3/image 48), with mild peripheral/round calcification (series 3/image 56). No renal calculi. Moderate to severe right hydroureteronephrosis, new.  No evidence of bowel obstruction. Colonic diverticulosis, without associated inflammatory changes.  Atherosclerotic calcifications of the abdominal aorta and branch vessels.  No suspicious abdominopelvic lymphadenopathy.  No abdominopelvic ascites.  Prostatomegaly, measuring 5.7 cm in maximal transverse dimension, indenting the base of the bladder.  Abrupt narrowing of the dilated right ureter at the level of the sacral ureter (series 3/image 73). No ureteral or bladder calculi. On the delayed imaging, a short segment of the right ureter remains unopacified just distal to the narrowing.  Degenerative changes of the visualized thoracolumbar spine. Superior endplate compression deformity at T12, unchanged. Scattered benign-appearing sclerotic lesions in the pelvis, grossly unchanged.  IMPRESSION: Moderate to severe right hydroureteronephrosis, new. Abrupt narrowing of the distal/sacral right ureter, possibly reflecting a ureteral stricture, urothelial neoplasm not excluded.  No renal, ureteral, or bladder calculi.  Reaccumulation of a left lower pole cyst, now measuring up to 9.3 cm. No enhancing renal lesions.  Prostatomegaly.   BUN & CREATININE1 18Mar2013 09:08AM1 Nesi, Marc1  SPECIMEN TYPE: BLOOD   Test Name Result Flag Reference  CREATININE1 1.04 mg/dL1  0.50-1.501  BUN1 19 mg/dL1  6-231  Est GFR, African American1 83 mL/min1    Est GFR, NonAfrican American1 71 mL/min1    The estimated GFR is a calculation valid for adults (39 to 73 years old) that uses the CKD-EPI algorithm to adjust for age and sex. It is not  to be used for children, pregnant women, hospitalized patients, patients on dialysis, or with rapidly changing kidney function. According to the NKDEP, eGFR >89 is normal, 60-89 shows mild impairment, 30-59 shows moderate impairment, 15-29 shows severe impairment and <15 is ESRD.     1. Amended By: Heloise Purpura; 03/14/2012 4:29 PMEST   I reviewed his medical records, retrograde pyelogram studies, and CT scan independently with findings as dictated above.  Assessment Assessed  1. Ureteral Stricture 593.3 2. Hydronephrosis 591  Plan Health Maintenance (V70.0)  1. UA With REFLEX  Done: 30Apr2013 08:40AM Ureteral Stricture (593.3)  2. Follow-up Schedule Surgery Office  Follow-up  Done: 30Apr2013  Discussion/Summary 1. Right ureteral stricture with associated hydronephrosis: I recommended that he proceed with a nuclear medicine renal scan to further assess his relative renal function and also recommended he proceed with cystoscopy, ureteroscopy, and possible brush biopsy to exclude the possibility of malignancy. If he is determined to have a short less than 1 cm ureteral stricture and there is no evidence of malignancy and he does have preserved renal function, his options would include balloon dilation and stent placement for 4-6 weeks versus definitive therapy with a ureteral reconstructive procedure which likely could be performed with a robotic-assisted technique. We briefly discussed that a balloon dilation  would be less invasive but also a lower chance of long-term success versus a definitive procedure which would be more invasive but higher chance for long-term success. He will proceed with his renogram in a coming endoscopic evaluation rule out malignancy prior to further discussion.  Cc: Dr. Ezzie Dural Dr. Herb Grays Dr. Fayrene Fearing Deterding     Signatures Electronically signed by : Heloise Purpura, M.D.; Mar 14 2012  4:29PM

## 2012-03-23 NOTE — Progress Notes (Signed)
Up to BR with help and voided large amt bloody urine.  124/70 Pulse 56 Resp 18    O2 sat   96%

## 2012-03-29 ENCOUNTER — Encounter (HOSPITAL_COMMUNITY): Payer: Self-pay | Admitting: Urology

## 2012-03-30 ENCOUNTER — Encounter: Payer: Self-pay | Admitting: Cardiovascular Disease

## 2012-04-05 ENCOUNTER — Ambulatory Visit (HOSPITAL_COMMUNITY)
Admission: RE | Admit: 2012-04-05 | Discharge: 2012-04-05 | Disposition: A | Discharge status: Home / Self Care | Payer: Medicare Other | Source: Ambulatory Visit | Attending: Urology | Admitting: Urology

## 2012-04-05 ENCOUNTER — Other Ambulatory Visit (HOSPITAL_COMMUNITY): Payer: Self-pay | Admitting: Urology

## 2012-04-05 ENCOUNTER — Encounter: Payer: Self-pay | Admitting: Physician Assistant

## 2012-04-05 DIAGNOSIS — C669 Malignant neoplasm of unspecified ureter: Secondary | ICD-10-CM

## 2012-04-06 ENCOUNTER — Other Ambulatory Visit: Payer: Self-pay | Admitting: Urology

## 2012-04-11 ENCOUNTER — Ambulatory Visit (INDEPENDENT_AMBULATORY_CARE_PROVIDER_SITE_OTHER): Payer: Medicare Other | Admitting: Physician Assistant

## 2012-04-11 ENCOUNTER — Encounter: Payer: Self-pay | Admitting: Physician Assistant

## 2012-04-11 VITALS — BP 122/68 | HR 63 | Ht 72.0 in | Wt 201.0 lb

## 2012-04-11 DIAGNOSIS — I4891 Unspecified atrial fibrillation: Secondary | ICD-10-CM

## 2012-04-11 DIAGNOSIS — I2510 Atherosclerotic heart disease of native coronary artery without angina pectoris: Secondary | ICD-10-CM

## 2012-04-11 DIAGNOSIS — C689 Malignant neoplasm of urinary organ, unspecified: Secondary | ICD-10-CM

## 2012-04-11 DIAGNOSIS — Z01810 Encounter for preprocedural cardiovascular examination: Secondary | ICD-10-CM

## 2012-04-11 DIAGNOSIS — E785 Hyperlipidemia, unspecified: Secondary | ICD-10-CM

## 2012-04-11 NOTE — Progress Notes (Signed)
[address] [place], [place]  [address] Phone: [phone] Fax:  [phone]  Date:  04/11/2012   Name:  [name] [name]   DOB:  13-Jul-1941   MRN:  [id number]  PCP:  Herb Grays, MD, MD  Primary Cardiologist:  Dr. Tonny Bollman  Primary Electrophysiologist:  None    History of Present Illness: [name] [name] is a 71 y.o. male who returns for surgical clearance.  He has a history of CAD, atrial fibrillation, hyperlipidemia, contrast-induced nephropathy.  He suffered an NSTEMI 02/2007.  LHC 02/2007: Proximal LAD 75%, mid LAD 99%, proximal RCA 25%.  PCI:  Cypher DES to the proximal and mid LAD.  Last nuclear study 11/2011 (after a trip to the ED with CP): EF 58%, low risk study with small inferior wall infarct at the mid and basal level, no ischemia.  Last echo 01/2010: Mild LVH, EF 55-60%, mild AI, mild MR, severely dilated LA and RA.  Last seen by Dr. Excell Seltzer 08/2011.  He was stable at that time with permanent atrial fibrillation and slow ventricular response.  The patient declined anticoagulation and remained on aspirin.  He is in need of laparoscopic nephroureterectomy secondary to ureteral stricture in the setting of urothelial carcinoma of the right ureter.  His surgeon is Dr. Heloise Purpura.  I have received notes from him and it is acceptable for the patient to remain on aspirin 81 mg.  The patient denies any chest pain, shortness of breath, syncope, orthopnea, PND or edema.  He works out at [employer] 3 times a week.  He can clearly achieve 4 METs without anginal symptoms.     Wt Readings from Last 3 Encounters:  04/11/12 201 lb (91.173 kg)  03/23/12 199 lb 8 oz (90.493 kg)  03/23/12 199 lb 8 oz (90.493 kg)     Potassium  Date/Time Value Range Status  03/23/2012  5:45 AM 3.8  3.5-5.1 (mEq/L) Final     Creatinine, Ser  Date/Time Value Range Status  03/23/2012  5:45 AM 1.09  0.50-1.35 (mg/dL) Final     ALT  Date/Time Value Range Status  01/27/2010  9:37 AM 20  0-53  (units/L) Final     Hemoglobin  Date/Time Value Range Status  03/23/2012  5:45 AM 13.3  13.0-17.0 (g/dL) Final    Past Medical History  Diagnosis Date  . CAD (coronary artery disease)      s/p NSTEMI 04/08;  LHC 02/2007: Proximal LAD 75%, mid LAD 99%, proximal RCA 25%.  PCI:  Cypher DES to the proximal and mid LAD.  Last nuclear study 11/2011 (after a trip to the ED with CP): EF 58%, low risk study with small inferior wall infarct at the mid and basal level, no ischemia.  Last echo 01/2010: Mild LVH, EF 55-60%, mild AI, mild MR, severely dilated LA and RA  . Contrast dye induced nephropathy     Hx ARF secondary to contrast nephropathy  . Dyslipidemia   . Epididymitis   . Pneumonia     hx of   . Pleurisy     2012  . Arthritis     shoulders and ribs   . 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 Atrial fibrillation   . Urothelial cancer     Dr. Laverle Patter    Current Outpatient Prescriptions  Medication Sig Dispense Refill  . aspirin 325 MG tablet Take 325 mg by mouth daily after breakfast.       . atorvastatin (  LIPITOR) 40 MG tablet Take 40 mg by mouth every evening.       . calcitRIOL (ROCALTROL) 0.25 MCG capsule Take 0.25 mcg by mouth daily after breakfast.       . furosemide (LASIX) 40 MG tablet Take 40 mg by mouth daily after breakfast.      . Inositol Niacinate (NIACIN FLUSH FREE) 500 MG CAPS Take 1 capsule by mouth daily.       . Multiple Vitamin (MULITIVITAMIN WITH MINERALS) TABS Take 1 tablet by mouth daily.      Marland Kitchen NITROSTAT 0.4 MG SL tablet DISSOLVE ONE TABLET UNDER THE TONGUE EVERY 5 MINUTES AS NEEDED FOR CHEST PAIN.  DO NOT EXCEED A TOTAL OF 3 DOSES IN 15 MINUTES  25 each  12  . Omega-3 Fatty Acids (FISH OIL) 1200 MG CAPS Take 1 capsule by mouth 2 (two) times daily.      . Potassium Gluconate 550 MG TABS Take 550 mg by mouth daily.       No current facility-administered medications for this visit.   Facility-Administered Medications Ordered in Other Visits  Medication Dose Route  Frequency Provider Last Rate Last Dose  . ceFAZolin (ANCEF) IVPB 1 g/50 mL premix  1 g Intravenous 30 min Pre-Op Lindaann Slough, MD        Allergies: Allergies  Allergen Reactions  . Contrast Media (Iodinated Diagnostic Agents)     Contrast-induced nephropathy requiring dialysis in 02/2007.    History  Substance Use Topics  . Smoking status: Former Smoker -- 2.0 packs/day for 10 years    Types: Cigarettes, Cigars    Quit date: 11/15/1981  . Smokeless tobacco: Never Used  . Alcohol Use: No     ROS:  Please see the history of present illness.     All other systems reviewed and negative.   PHYSICAL EXAM: VS:  BP 122/68  Pulse 63  Ht 6' (1.829 m)  Wt 201 lb (91.173 kg)  BMI 27.26 kg/m2 Well nourished, well developed, in no acute distress HEENT: normal Neck: no JVD Vascular: No carotid bruits Cardiac:  normal S1, S2; Irregularly irregular rhythm; no murmur Lungs:  clear to auscultation bilaterally, no wheezing, rhonchi or rales Abd: soft, nontender, no hepatomegaly Ext: no edema Skin: warm and dry Neuro:  CNs 2-12 intact, no focal abnormalities noted  EKG:  Atrial fibrillation, rate 63, nonspecific ST-T wave changes, no change prior tracing   ASSESSMENT AND PLAN:  1.  Coronary Artery Disease The patient does not have any unstable cardiac conditions.  He can achieve 4 METs or greater without anginal symptoms.  He had a low risk Myoview in 11/2011.   According to [facility] and AHA guidelines, he requires no further cardiac workup prior to his noncardiac surgery.  The patient should be at acceptable risk.  Our service is available as necessary in the perioperative period.  He will remain on ASA 81 mg QD.  Follow up with Dr. Tonny Bollman as planned in 08/2012.  2.  Atrial Fibrillation He declines Rx with coumadin or Pradaxa. He remains on ASA. Rate is controlled.  3.  Hyperlipidemia Managed by PCP.   4.  Urothelial CA Management per urology.   Luna Glasgow, PA-C   12:56 PM 04/11/2012

## 2012-04-26 ENCOUNTER — Encounter (HOSPITAL_COMMUNITY): Payer: Self-pay | Admitting: Pharmacy Technician

## 2012-04-27 ENCOUNTER — Encounter (HOSPITAL_COMMUNITY): Payer: Self-pay

## 2012-04-27 ENCOUNTER — Encounter (HOSPITAL_COMMUNITY)
Admission: RE | Admit: 2012-04-27 | Discharge: 2012-04-27 | Disposition: A | Discharge status: Home / Self Care | Payer: Medicare Other | Source: Ambulatory Visit | Attending: Urology | Admitting: Urology

## 2012-04-27 HISTORY — DX: Personal history of other medical treatment: Z92.89

## 2012-04-27 LAB — BASIC METABOLIC PANEL
BUN: 17 mg/dL (ref 6–23)
Chloride: 103 mEq/L (ref 96–112)
Creatinine, Ser: 1.17 mg/dL (ref 0.50–1.35)
GFR calc Af Amer: 70 mL/min — ABNORMAL LOW (ref >90)
GFR calc non Af Amer: 60 mL/min — ABNORMAL LOW (ref >90)
Glucose, Bld: 87 mg/dL (ref 70–99)
Potassium: 4.7 mEq/L (ref 3.5–5.1)

## 2012-04-27 LAB — CBC
HCT: 40.9 % (ref 39.0–52.0)
Hemoglobin: 13.2 g/dL (ref 13.0–17.0)
MCH: 29.9 pg (ref 26.0–34.0)
MCHC: 32.3 g/dL (ref 30.0–36.0)
RDW: 13.4 % (ref 11.5–15.5)

## 2012-04-27 NOTE — Patient Instructions (Signed)
[address] Jay[address] Fleener  04/27/2012   Your procedure is scheduled on:  05/04/12 surgery [id number]   THURSDAY  Report to [facility] at 0515      AM.  Call this number if you have problems the morning of surgery: [phone]     Or PST   [phone]  [facility]   Remember:   Do not eat food   :After Midnight.  Tuesday NIGHT AND BEGIN BOWEL PREP WEDNESDAY AS PER OFFICE  May have clear liquids: all day Wednesday UNTIL MIDNIGHT Wednesday NIGHT THEN NONE  Clear liquids include soda, tea, black coffee, apple or grape juice, broth.  Take these medicines the morning of surgery with A SIP OF WATER: Nitroglycerin if needed   Do not wear jewelry, make-up or nail polish.  Do not wear lotions, powders, or perfumes. You may wear deodorant.  Do not shave 48 hours prior to surgery.  Do not bring valuables to the hospital.  Contacts, dentures or bridgework may not be worn into surgery.  Leave suitcase in the car. After surgery it may be brought to your room.  For patients admitted to the hospital, checkout time is 11:00 AM the day of discharge.   Patients discharged the day of surgery will not be allowed to drive home.  Name and phone number of your driver:    wife                                                                  Special Instructions: CHG Shower Use Special Wash: 1/2 bottle night before surgery and 1/2 bottle morning of surgery. REGULAR SOAP FACE AND PRIVATES                       MEN-MAY SHAVE FACE MORNING OF SURGERY  Please read over the following fact sheets that you were given: MRSA Information

## 2012-04-27 NOTE — Pre-Procedure Instructions (Signed)
States will be starting ASPIRIN 81 mg 5 days before surgery instead of the 325mg 

## 2012-05-03 NOTE — H&P (Signed)
History of Present Illness  [name] [name] is a 71 year old with the following urologic history:  1) Right hydronephrosis / right ureteral stricture: He apparently has a remote history of right hydronephrosis and had a ureteral stent placed in [place] 20 years ago which was subsequently removed. He had a CT scan in 2009 which did not demonstrate any right hydronephrosis or hydroureter. He had a CT scan performed in March 2013 for other reasons which demonstrated severe right hydroureteronephrosis with dilation down to a distinct transition point in the mid ureter. This was a change from his prior scan in 2009. No definite mass or extrinsic cause for this finding was present.  He was taken to the OR by Dr. Brunilda Payor on 02/15/12 and retrograde pyelography was consistent with the CT findings of a short narrowed stricture with no definite filling defects.  An attempt to perform ureteroscopy was unsuccessful and a ureteral stent was placed. He has been asymptomatic. He was initially seen by me in consultation in April 2013 and underwent repeat ureteroscopy on 03/23/12 after an initial period of passive dilation with a stent.  I was able to perform ureteroscopy which revealed a 1.5 cm stricture with no evidence to suggest obvious malignancy visually.  A brush biopsy was performed. A nuclear medicine renal scan has confirmed preserved function of the right kidney.  2) Left renal cyst: He has a history of a large left renal cyst and underwent percutaneous aspiration and ethanol sclerosis in 2009.  This cyst was noted to have reaccumulated on imaging in March 2013.  3) R testicular pain: He has a history of right lower quadrant and right testicular pain which had improved after treatment with doxycycline in 2013.  ** He has a significant cardiac history including a prior MI and cardiac stent placement as well as atrial fibrillation.  He is on ASA 325 mg but does not take Plavix and does not take Coumadin due to difficulty with  regulation of his INR in the past. Although, he has listed IV contrast as an allergy, he does not have a true allergy but rather developed contrast-induced nephropathy during a cardiac catheterization in the past which resolved.   Interval history:   He follows up today after his recent endoscopic evaluation which included ureteroscopy and brush biopsy of his ureteral stricture. His stricture was noted to be approximately 1.5-2 cm in total length. 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 He has tolerated his procedure well and has recovered without difficulty.   Past Medical History Problems  1. History of  Acute Myocardial Infarction V12.59 2. History of  Atrial Fibrillation 427.31 3. History of  Hypercholesterolemia 272.0 4. History of  Hypertension 401.9  Surgical History Problems  1. History of  Cath Stent Placement 2. History of  Cystoscopy With Insertion Of Ureteral Stent Right 3. History of  Cystourethroscopy W/ Ureteroscopy W/ Tx Of Ureteral Strict  Current Meds 1. Aspirin 81 MG Oral Tablet; Therapy: (Recorded:20Oct2008) to 2. Atorvastatin Calcium 40 MG Oral Tablet; Therapy: 26Jun2012 to 3. Calcitriol 0.25 MCG Oral Capsule; Therapy: 22Mar2012 to 4. Fish Oil CPDR; Therapy: (Recorded:30Apr2013) to 5. Furosemide 40 MG Oral Tablet; Therapy: (Recorded:20Oct2008) to 6. Multi-Vitamin TABS; Therapy: (Recorded:20Oct2008) to 7. Niacin TABS; Therapy: (Recorded:30Apr2013) to 8. NitroQuick 0.4 MG SUBL; Therapy: (Recorded:20Oct2008) to  Allergies Medication  1. No Known Drug Allergies  Family History Problems  1. Family history of  Diabetes Mellitus V18.0 2. Paternal history of  Gastric Cancer V16.0 3.  Paternal history of  Heart Disease V17.49 4. Family history of  Prostate Cancer V16.42  Social History Problems  1. Marital History - Currently Married 2. Tobacco Use V15.82 Denied  3. History of  Alcohol Use  Review of  Systems Genitourinary system(s) were reviewed and pertinent findings if present are noted.  Cardiovascular: no chest pain.  Respiratory: no shortness of breath.    Vitals Vital Signs [Data Includes: Last 1 Day]  22May2013 12:38PM  BMI Calculated: 19.87 BSA Calculated: 2.34 Height: 6 ft 11 in Weight: 195 lb  Blood Pressure: 124 / 69 Heart Rate: 80  Physical Exam Constitutional: Well nourished and well developed . No acute distress.  ENT:. The ears and nose are normal in appearance.  Neck: The appearance of the neck is normal and no neck mass is present.  Pulmonary: No respiratory distress and normal respiratory rhythm and effort.  Cardiovascular: Heart rate and rhythm are normal . No peripheral edema.  Abdomen: The abdomen is soft and nontender. No masses are palpated. No CVA tenderness. No hernias are palpable. No hepatosplenomegaly noted.  Lymphatics: The supraclavicular, femoral and inguinal nodes are not enlarged or tender.  Skin: Normal skin turgor, no visible rash and no visible skin lesions.  Neuro/Psych:. Mood and affect are appropriate.    Results/Data Urine [Data Includes: Last 1 Day]   22May2013  COLOR YELLOW   APPEARANCE CLEAR   SPECIFIC GRAVITY 1.010   pH 6.0   GLUCOSE NEG mg/dL  BILIRUBIN NEG   KETONE NEG mg/dL  BLOOD MOD   PROTEIN NEG mg/dL  UROBILINOGEN 0.2 mg/dL  NITRITE NEG   LEUKOCYTE ESTERASE TRACE   SQUAMOUS EPITHELIAL/HPF RARE   WBC NONE SEEN WBC/hpf  RBC 11-20 RBC/hpf  BACTERIA NONE SEEN   CRYSTALS NONE SEEN   CASTS NONE SEEN    Assessment Assessed  1. Ureteral Stricture 593.3  Plan Health Maintenance (V70.0)  1. UA With REFLEX  Done: 22May2013 12:31PM Malignant Neoplasm Of The Ureter (189.2)  2. CHEST X-RAY  Requested for: 22May2013 3. COMPREHENSIVE METABOLIC PANEL  Done: 22May2013 4. VENIPUNCTURE  Done: 22May2013 5. Follow-up Schedule Surgery Office  Follow-up  Done: 22May2013  Discussion/Summary 1. Urothelial carcinoma of the  right ureter: I had a long discussion with [name] [name] and his wife today and reviewed his biopsy pathology results in detail. He appears to have high-grade urothelial carcinoma as the cause of his stricture.  At this point, I have recommended that he proceed with a right nephroureterectomy. I do think this can be performed in a minimally invasive fashion. We reviewed the potential risks and complications of this procedure including but not limited to bleeding, infection, major cardiopulmonary risks associated with major surgery and general anesthesia such as heart attack, stroke, death, venothromboembolism, et Karie Soda. We reviewed the risks of damage to adjacent structures including but not limited to the liver, gallbladder, adrenal gland, bowel/colon, major vascular structures, bladder, and rectum, Et cetera. We reviewed the risk of progressive chronic kidney disease. We reviewed the need for a postoperative urinary catheter until his bladder is healed. We also reviewed the expected hospital course and postoperative recovery process in detail. He understands risks for recurrence and the need for surveillance as well as possible need for further therapy.  At this point, he will be scheduled to complete his metastatic evaluation including chest imaging and laboratory studies. He will be scheduled for a right robotic-assisted laparoscopic nephroureterectomy. He has seen Dr. Tonny Bollman in the not-too-distant past and apparently underwent a  stress test. I will request a cardiac risk assessment prior to his upcoming surgery. He will continue aspirin 81 mg perioperatively because of his cardiac stent.   Cc: Dr. Su Grand Dr. Herb Grays Dr. Fayrene Fearing Deterding Dr. Tonny Bollman   A total of 50 minutes were spent in the overall care of the patient today with 35 minutes in direct face to face consultation.    Signatures Electronically signed by : Heloise Purpura, M.D.; Apr 05 2012  5:48PM

## 2012-05-04 ENCOUNTER — Encounter (HOSPITAL_COMMUNITY): Payer: Self-pay | Admitting: Anesthesiology

## 2012-05-04 ENCOUNTER — Encounter (HOSPITAL_COMMUNITY): Payer: Self-pay | Admitting: *Deleted

## 2012-05-04 ENCOUNTER — Ambulatory Visit (HOSPITAL_COMMUNITY): Payer: Medicare Other | Admitting: Anesthesiology

## 2012-05-04 ENCOUNTER — Inpatient Hospital Stay (HOSPITAL_COMMUNITY)
Admission: RE | Admit: 2012-05-04 | Discharge: 2012-05-08 | DRG: 658 | Disposition: A | Discharge status: Home / Self Care | Payer: Medicare Other | Source: Ambulatory Visit | Attending: Urology | Admitting: Urology

## 2012-05-04 ENCOUNTER — Encounter (HOSPITAL_COMMUNITY)
Admission: RE | Disposition: A | Discharge status: Home / Self Care | Payer: Self-pay | Source: Ambulatory Visit | Attending: Urology

## 2012-05-04 DIAGNOSIS — N135 Crossing vessel and stricture of ureter without hydronephrosis: Secondary | ICD-10-CM | POA: Diagnosis present

## 2012-05-04 DIAGNOSIS — I4891 Unspecified atrial fibrillation: Secondary | ICD-10-CM | POA: Diagnosis present

## 2012-05-04 DIAGNOSIS — I10 Essential (primary) hypertension: Secondary | ICD-10-CM | POA: Diagnosis present

## 2012-05-04 DIAGNOSIS — I252 Old myocardial infarction: Secondary | ICD-10-CM

## 2012-05-04 DIAGNOSIS — E7800 Pure hypercholesterolemia, unspecified: Secondary | ICD-10-CM | POA: Diagnosis present

## 2012-05-04 DIAGNOSIS — Z01812 Encounter for preprocedural laboratory examination: Secondary | ICD-10-CM

## 2012-05-04 DIAGNOSIS — D0910 Carcinoma in situ of unspecified urinary organ: Principal | ICD-10-CM | POA: Diagnosis present

## 2012-05-04 LAB — HEMOGLOBIN AND HEMATOCRIT, BLOOD
HCT: 40.4 % (ref 39.0–52.0)
Hemoglobin: 13.3 g/dL (ref 13.0–17.0)

## 2012-05-04 LAB — BASIC METABOLIC PANEL
CO2: 24 mEq/L (ref 19–32)
Calcium: 9 mg/dL (ref 8.4–10.5)
Chloride: 102 mEq/L (ref 96–112)
Glucose, Bld: 120 mg/dL — ABNORMAL HIGH (ref 70–99)
Potassium: 4.4 mEq/L (ref 3.5–5.1)
Sodium: 138 mEq/L (ref 135–145)

## 2012-05-04 LAB — TYPE AND SCREEN

## 2012-05-04 SURGERY — ROBOT ASSITED LAPAROSCOPIC NEPHROURETERECTOMY
Anesthesia: General | Site: Abdomen | Laterality: Right | Wound class: Clean Contaminated

## 2012-05-04 MED ORDER — EPHEDRINE SULFATE 50 MG/ML IJ SOLN
INTRAMUSCULAR | Status: DC | PRN
  Administered 2012-05-04: 10 mg via INTRAVENOUS

## 2012-05-04 MED ORDER — LACTATED RINGERS IV SOLN
INTRAVENOUS | Status: DC | PRN
  Administered 2012-05-04 (×3): via INTRAVENOUS

## 2012-05-04 MED ORDER — DEXTROSE-NACL 5-0.45 % IV SOLN
INTRAVENOUS | Status: DC
  Administered 2012-05-04 – 2012-05-06 (×6): via INTRAVENOUS
  Administered 2012-05-07: 75 mL/h via INTRAVENOUS

## 2012-05-04 MED ORDER — CALCITRIOL 0.25 MCG PO CAPS
0.2500 ug | ORAL_CAPSULE | Freq: Every day | ORAL | Status: DC
  Administered 2012-05-05 – 2012-05-08 (×4): 0.25 ug via ORAL
  Filled 2012-05-04 (×5): qty 1

## 2012-05-04 MED ORDER — ONDANSETRON HCL 4 MG/2ML IJ SOLN
4.0000 mg | INTRAMUSCULAR | Status: DC | PRN
  Administered 2012-05-04 (×2): 4 mg via INTRAVENOUS
  Filled 2012-05-04 (×2): qty 2

## 2012-05-04 MED ORDER — NEOSTIGMINE METHYLSULFATE 1 MG/ML IJ SOLN
INTRAMUSCULAR | Status: DC | PRN
  Administered 2012-05-04: 5 mg via INTRAVENOUS

## 2012-05-04 MED ORDER — ACETAMINOPHEN 10 MG/ML IV SOLN
1000.0000 mg | Freq: Four times a day (QID) | INTRAVENOUS | Status: AC
  Administered 2012-05-04 – 2012-05-05 (×4): 1000 mg via INTRAVENOUS
  Filled 2012-05-04 (×4): qty 100

## 2012-05-04 MED ORDER — ZOLPIDEM TARTRATE 5 MG PO TABS: 5.0000 mg | ORAL_TABLET | Freq: Every evening | ORAL | Status: DC | PRN

## 2012-05-04 MED ORDER — GLYCOPYRROLATE 0.2 MG/ML IJ SOLN
INTRAMUSCULAR | Status: DC | PRN
  Administered 2012-05-04: 0.6 mg via INTRAVENOUS

## 2012-05-04 MED ORDER — FUROSEMIDE 40 MG PO TABS
40.0000 mg | ORAL_TABLET | Freq: Every day | ORAL | Status: DC
  Administered 2012-05-05 – 2012-05-08 (×4): 40 mg via ORAL
  Filled 2012-05-04 (×5): qty 1

## 2012-05-04 MED ORDER — FENTANYL CITRATE 0.05 MG/ML IJ SOLN
INTRAMUSCULAR | Status: DC | PRN
  Administered 2012-05-04: 25 ug via INTRAVENOUS
  Administered 2012-05-04: 100 ug via INTRAVENOUS
  Administered 2012-05-04 (×2): 50 ug via INTRAVENOUS
  Administered 2012-05-04: 100 ug via INTRAVENOUS
  Administered 2012-05-04: 50 ug via INTRAVENOUS
  Administered 2012-05-04: 100 ug via INTRAVENOUS
  Administered 2012-05-04: 25 ug via INTRAVENOUS

## 2012-05-04 MED ORDER — PROMETHAZINE HCL 25 MG/ML IJ SOLN: 6.2500 mg | INTRAMUSCULAR | Status: DC | PRN

## 2012-05-04 MED ORDER — HYDROMORPHONE HCL PF 1 MG/ML IJ SOLN
0.2500 mg | INTRAMUSCULAR | Status: DC | PRN
  Administered 2012-05-04 (×2): 0.5 mg via INTRAVENOUS

## 2012-05-04 MED ORDER — DIPHENHYDRAMINE HCL 12.5 MG/5ML PO ELIX: 12.5000 mg | ORAL_SOLUTION | Freq: Four times a day (QID) | ORAL | Status: DC | PRN

## 2012-05-04 MED ORDER — HYDROMORPHONE HCL PF 1 MG/ML IJ SOLN
INTRAMUSCULAR | Status: AC
  Filled 2012-05-04: qty 2

## 2012-05-04 MED ORDER — ROCURONIUM BROMIDE 100 MG/10ML IV SOLN
INTRAVENOUS | Status: DC | PRN
  Administered 2012-05-04: 20 mg via INTRAVENOUS
  Administered 2012-05-04: 50 mg via INTRAVENOUS
  Administered 2012-05-04: 20 mg via INTRAVENOUS
  Administered 2012-05-04: 10 mg via INTRAVENOUS

## 2012-05-04 MED ORDER — CEFAZOLIN SODIUM-DEXTROSE 2-3 GM-% IV SOLR
2.0000 g | Freq: Once | INTRAVENOUS | Status: AC
  Administered 2012-05-04: 2 g via INTRAVENOUS

## 2012-05-04 MED ORDER — BUPIVACAINE LIPOSOME 1.3 % IJ SUSP
20.0000 mL | Freq: Once | INTRAMUSCULAR | Status: DC
  Filled 2012-05-04: qty 20

## 2012-05-04 MED ORDER — ACETAMINOPHEN 10 MG/ML IV SOLN
INTRAVENOUS | Status: AC
  Filled 2012-05-04: qty 100

## 2012-05-04 MED ORDER — HEPARIN SODIUM (PORCINE) 1000 UNIT/ML IJ SOLN
INTRAMUSCULAR | Status: AC
  Filled 2012-05-04: qty 1

## 2012-05-04 MED ORDER — CEFAZOLIN SODIUM-DEXTROSE 2-3 GM-% IV SOLR
INTRAVENOUS | Status: AC
  Filled 2012-05-04: qty 50

## 2012-05-04 MED ORDER — LACTATED RINGERS IR SOLN
Status: DC | PRN
  Administered 2012-05-04: 1000 mL

## 2012-05-04 MED ORDER — DIPHENHYDRAMINE HCL 50 MG/ML IJ SOLN
12.5000 mg | Freq: Four times a day (QID) | INTRAMUSCULAR | Status: DC | PRN
  Administered 2012-05-06 – 2012-05-07 (×2): 12.5 mg via INTRAVENOUS
  Filled 2012-05-04 (×2): qty 1

## 2012-05-04 MED ORDER — ONDANSETRON HCL 4 MG/2ML IJ SOLN
INTRAMUSCULAR | Status: DC | PRN
  Administered 2012-05-04: 4 mg via INTRAVENOUS

## 2012-05-04 MED ORDER — BUPIVACAINE LIPOSOME 1.3 % IJ SUSP
INTRAMUSCULAR | Status: DC | PRN
  Administered 2012-05-04: 20 mL

## 2012-05-04 MED ORDER — ATORVASTATIN CALCIUM 40 MG PO TABS
40.0000 mg | ORAL_TABLET | Freq: Every evening | ORAL | Status: DC
  Administered 2012-05-04 – 2012-05-07 (×4): 40 mg via ORAL
  Filled 2012-05-04 (×5): qty 1

## 2012-05-04 MED ORDER — ACETAMINOPHEN 10 MG/ML IV SOLN
INTRAVENOUS | Status: DC | PRN
  Administered 2012-05-04: 1000 mg via INTRAVENOUS

## 2012-05-04 MED ORDER — HYDROMORPHONE HCL PF 1 MG/ML IJ SOLN
0.5000 mg | INTRAMUSCULAR | Status: DC | PRN
  Administered 2012-05-04 – 2012-05-05 (×4): 1 mg via INTRAVENOUS
  Administered 2012-05-05: 0.5 mg via INTRAVENOUS
  Administered 2012-05-05 – 2012-05-07 (×4): 1 mg via INTRAVENOUS
  Filled 2012-05-04 (×10): qty 1

## 2012-05-04 MED ORDER — DOCUSATE SODIUM 100 MG PO CAPS
100.0000 mg | ORAL_CAPSULE | Freq: Two times a day (BID) | ORAL | Status: DC
  Administered 2012-05-04 – 2012-05-08 (×9): 100 mg via ORAL
  Filled 2012-05-04 (×10): qty 1

## 2012-05-04 MED ORDER — ASPIRIN EC 81 MG PO TBEC
81.0000 mg | DELAYED_RELEASE_TABLET | Freq: Every day | ORAL | Status: DC
  Administered 2012-05-04 – 2012-05-08 (×5): 81 mg via ORAL
  Filled 2012-05-04 (×5): qty 1

## 2012-05-04 MED ORDER — LIDOCAINE HCL (CARDIAC) 20 MG/ML IV SOLN
INTRAVENOUS | Status: DC | PRN
  Administered 2012-05-04: 50 mg via INTRAVENOUS

## 2012-05-04 MED ORDER — PROPOFOL 10 MG/ML IV EMUL
INTRAVENOUS | Status: DC | PRN
  Administered 2012-05-04: 150 mg via INTRAVENOUS

## 2012-05-04 MED ORDER — CEFAZOLIN SODIUM 1-5 GM-% IV SOLN
1.0000 g | Freq: Three times a day (TID) | INTRAVENOUS | Status: AC
  Administered 2012-05-04 – 2012-05-05 (×2): 1 g via INTRAVENOUS
  Filled 2012-05-04 (×3): qty 50

## 2012-05-04 MED ORDER — STERILE WATER FOR IRRIGATION IR SOLN
Status: DC | PRN
  Administered 2012-05-04: 3000 mL

## 2012-05-04 SURGICAL SUPPLY — 74 items
ADH SKN CLS APL DERMABOND .7 (GAUZE/BANDAGES/DRESSINGS) ×1
APL ESCP 34 STRL LF DISP (HEMOSTASIS)
APPLICATOR SURGIFLO ENDO (HEMOSTASIS) IMPLANT
BAG SPEC RTRVL LRG 6X4 10 (ENDOMECHANICALS)
CANNULA SEAL DVNC (CANNULA) ×3 IMPLANT
CANNULA SEALS DA VINCI (CANNULA) ×1
CATH FOLEY 3WAY  5CC 18FR (CATHETERS) ×1
CATH FOLEY 3WAY 5CC 18FR (CATHETERS) IMPLANT
CHLORAPREP W/TINT 26ML (MISCELLANEOUS) ×2 IMPLANT
CLIP LIGATING HEM O LOK PURPLE (MISCELLANEOUS) ×2 IMPLANT
CLIP LIGATING HEMO O LOK GREEN (MISCELLANEOUS) ×2 IMPLANT
CLIP SUT LAPRA TY ABSORB (SUTURE) ×2 IMPLANT
CLOTH BEACON ORANGE TIMEOUT ST (SAFETY) ×2 IMPLANT
CORDS BIPOLAR (ELECTRODE) ×2 IMPLANT
COVER SURGICAL LIGHT HANDLE (MISCELLANEOUS) ×2 IMPLANT
COVER TIP SHEARS 8 DVNC (MISCELLANEOUS) ×1 IMPLANT
COVER TIP SHEARS 8MM DA VINCI (MISCELLANEOUS) ×1
CUTTER FLEX LINEAR 45M (STAPLE) ×1 IMPLANT
DECANTER SPIKE VIAL GLASS SM (MISCELLANEOUS) ×1 IMPLANT
DERMABOND ADVANCED (GAUZE/BANDAGES/DRESSINGS) ×1
DERMABOND ADVANCED .7 DNX12 (GAUZE/BANDAGES/DRESSINGS) ×2 IMPLANT
DRAIN CHANNEL 15F RND FF 3/16 (WOUND CARE) ×2 IMPLANT
DRAPE INCISE IOBAN 66X45 STRL (DRAPES) ×2 IMPLANT
DRAPE LAPAROSCOPIC ABDOMINAL (DRAPES) ×2 IMPLANT
DRAPE LG THREE QUARTER DISP (DRAPES) ×4 IMPLANT
DRAPE TABLE BACK 44X90 PK DISP (DRAPES) ×2 IMPLANT
DRAPE WARM FLUID 44X44 (DRAPE) ×2 IMPLANT
DRESSING SURGICEL FIBRLLR 1X2 (HEMOSTASIS) ×1 IMPLANT
DRSG SURGICEL FIBRILLAR 1X2 (HEMOSTASIS)
ELECT REM PT RETURN 9FT ADLT (ELECTROSURGICAL) ×4
ELECTRODE REM PT RTRN 9FT ADLT (ELECTROSURGICAL) ×2 IMPLANT
EVACUATOR SILICONE 100CC (DRAIN) ×2 IMPLANT
GAUZE VASELINE 3X9 (GAUZE/BANDAGES/DRESSINGS) IMPLANT
GLOVE BIOGEL M STRL SZ7.5 (GLOVE) ×5 IMPLANT
GOWN STRL NON-REIN LRG LVL3 (GOWN DISPOSABLE) ×14 IMPLANT
HEMOSTAT SURGICEL 4X8 (HEMOSTASIS) IMPLANT
KIT ACCESSORY DA VINCI DISP (KITS) ×1
KIT ACCESSORY DVNC DISP (KITS) ×1 IMPLANT
KIT BASIN OR (CUSTOM PROCEDURE TRAY) ×2 IMPLANT
NS IRRIG 1000ML POUR BTL (IV SOLUTION) ×1 IMPLANT
PEN SKIN MARKING BROAD (MISCELLANEOUS) ×2 IMPLANT
PENCIL BUTTON HOLSTER BLD 10FT (ELECTRODE) ×3 IMPLANT
POSITIONER SURGICAL ARM (MISCELLANEOUS) ×4 IMPLANT
POUCH ENDO CATCH II 15MM (MISCELLANEOUS) ×1 IMPLANT
POUCH SPECIMEN RETRIEVAL 10MM (ENDOMECHANICALS) ×1 IMPLANT
RELOAD 45 VASCULAR/THIN (ENDOMECHANICALS) ×2 IMPLANT
RELOAD STAPLE 45 2.5 WHT GRN (ENDOMECHANICALS) IMPLANT
SET TUBE IRRIG SUCTION NO TIP (IRRIGATION / IRRIGATOR) ×1 IMPLANT
SOLUTION ANTI FOG 6CC (MISCELLANEOUS) ×2 IMPLANT
SOLUTION ELECTROLUBE (MISCELLANEOUS) ×2 IMPLANT
SPONGE LAP 18X18 X RAY DECT (DISPOSABLE) ×1 IMPLANT
SURGIFLO W/THROMBIN 8M KIT (HEMOSTASIS) ×2 IMPLANT
SUT ETHILON 3 0 PS 1 (SUTURE) ×2 IMPLANT
SUT MNCRL AB 4-0 PS2 18 (SUTURE) ×5 IMPLANT
SUT MON AB 2-0 SH 27 (SUTURE) ×2 IMPLANT
SUT MON AB 2-0 SH27 (SUTURE) ×1 IMPLANT
SUT V-LOC BARB 180 2/0GR6 GS22 (SUTURE) ×2
SUT VIC AB 0 CT1 27 (SUTURE) ×4
SUT VIC AB 0 CT1 27XBRD ANTBC (SUTURE) ×2 IMPLANT
SUT VIC AB 0 UR5 27 (SUTURE) ×1 IMPLANT
SUT VIC AB 2-0 SH 27 (SUTURE)
SUT VIC AB 2-0 SH 27X BRD (SUTURE) ×2 IMPLANT
SUT VIC AB 4-0 RB1 27 (SUTURE) ×4
SUT VIC AB 4-0 RB1 27XBRD (SUTURE) ×2 IMPLANT
SUT VICRYL 0 UR6 27IN ABS (SUTURE) ×6 IMPLANT
SUTURE V-LC BRB 180 2/0GR6GS22 (SUTURE) IMPLANT
SYR BULB IRRIGATION 50ML (SYRINGE) IMPLANT
TOWEL OR NON WOVEN STRL DISP B (DISPOSABLE) ×2 IMPLANT
TRAY FOLEY CATH 14FRSI W/METER (CATHETERS) ×2 IMPLANT
TRAY LAP CHOLE (CUSTOM PROCEDURE TRAY) ×2 IMPLANT
TROCAR ENDOPATH XCEL 12X100 BL (ENDOMECHANICALS) ×2 IMPLANT
TROCAR XCEL 12X100 BLDLESS (ENDOMECHANICALS) ×2 IMPLANT
TUBING INSUFFLATION 10FT LAP (TUBING) ×2 IMPLANT
WATER STERILE IRR 1500ML POUR (IV SOLUTION) ×2 IMPLANT

## 2012-05-04 NOTE — Transfer of Care (Signed)
Immediate Anesthesia Transfer of Care Note  Patient: [name] [name]  Procedure(s) Performed: Procedure(s) (LRB): ROBOT ASSITED LAPAROSCOPIC NEPHROURETERECTOMY (Right)  Patient Location: PACU  Anesthesia Type: General  Level of Consciousness: awake and alert   Airway & Oxygen Therapy: Patient Spontanous Breathing and Patient connected to face mask oxygen  Post-op Assessment: Report given to PACU RN and Post -op Vital signs reviewed and stable  Post vital signs: Reviewed and stable  Complications: No apparent anesthesia complications

## 2012-05-04 NOTE — Op Note (Signed)
Preoperative diagnosis: Urothelial carcinoma of the right ureter  Postoperative diagnosis: Urothelial carcinoma of the right ureter  Procedure:  1. Right robotic-assisted laparoscopic nephroureterectomy  Surgeon: Moody Bruins. M.D.  Assistant(s): Pecola Leisure, PA-C  Anesthesia: General  Complications: None  EBL: 10 mL  IVF:  2700 mL crystalloid  Specimens: 1. Right kidney, ureter, and bladder cuff  Disposition of specimens: Pathology  Drains: 1. # 15 Blake pelvic drain  Indication:  [name] [name] is a 71 y.o. 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  After a thorough review of the management options for their renal mass, they elected to proceed with surgical treatment and the above procedure.  We have discussed the potential benefits and risks of the procedure, side effects of the proposed treatment, the likelihood of the patient achieving the goals of the procedure, and any potential problems that might occur during the procedure or recuperation. Informed consent has been obtained.   Description of procedure:  The patient was taken to the operating room and a general anesthetic was administered. The patient was given preoperative antibiotics, placed in the right modified flank position with care to pad all potential pressure points, and prepped and draped in the usual sterile fashion. Next a preoperative timeout was performed.  A site was selected on the right side of the umbilicus for placement of the camera port. This was placed using a standard open Hassan technique which allowed entry into the peritoneal cavity under direct vision and without difficulty. A 12 mm port was placed and a pneumoperitoneum established. The camera was then used to inspect the abdomen and there was no evidence of any intra-abdominal injuries or other abnormalities. The remaining abdominal ports were then placed. 8 mm robotic ports were placed in the right  upper quadrant, right lower quadrant, and far right lateral abdominal wall. A 12 mm port was placed in the upper midline for laparoscopic assistance. All ports were placed under direct vision without difficulty. The surgical cart was then docked.   Utilizing the cautery scissors, the white line of Toldt was incised allowing the colon to be mobilized medially and the plane between the mesocolon and the anterior layer of Gerota's fascia to be developed and the kidney to be exposed.  The ureter and gonadal vein were identified inferiorly and the ureter was lifted anteriorly off the psoas muscle.  Dissection proceeded superiorly along the gonadal vein until the renal vein was identified.  The renal hilum was then carefully isolated with a combination of blunt and sharp dissection allowing the renal arterial and venous structures to be separated and isolated.  There was a single renal artery and a single renal vein.  The renal artery was ligated with multiple Weck clips and divided. A 45 mm Flex ETS vascular stapler was then used to staple ligate and divided the renal vein. The hepatorenal ligaments and lateral attachments to the kidney were then divided with combination of sharp and blunt dissection. Once the kidney was freely mobile, the ureter was further dissected distally to the level of the crossing common iliac artery.  At this point, the surgical cart was undocked. An additional 8 mm robotic port was then placed in the lower midline. The surgical cart was then again docked from a lateral and inferior position over the hip. The ureter was retracted cephalad and the overlying peritoneum was incised. Examining the portion of the ureter just distal to the iliac vessels, there was noted to be  a dense reaction. This correlated to the area of stricture previously seen during his endoscopic evaluation. A wider resection of the peri-ureteral tissue was performed including a small portion of the colon mesentery. The  opening in the mesentery was then closed with a running 3-0 Vicryl suture. The ureter was then dissected down to the bladder and detrusor muscle fibers were identified. A Weck clip was placed on the distal ureter. The bladder was then entered allowing the ureter to be removed in the bladder along with a bladder cuff. The opening in the bladder was then closed with a running 2-0 V-Lock suture including a running mucosal layer followed by an imbricating layer of the detrusor muscle and perivesical tissue. The bladder was then filled with 250 cc of sterile saline and Initially there was a small urine leak. The area of concern was identified in the 2-0 V-Lock suture was used to repair this area. The bladder was then again filled and there did not appear to be any urine leak. A #19 Blake drain was then placed through the fourth arm robotic port and positioned within the pelvis. There was secure the skin with a silk suture.  The robotic cart was then undocked. The camera port incision was closed laparoscopically with a 2-0 Vicryl figure-of-eight suture. The 12 mm port site in the upper midline was extended slightly in a periumbilical fashion after the specimen had been placed into an Endo Catch II retrieval bag. The specimen was removed intact within the retrieval bag. All remaining ports had been removed under direct vision. The upper midline opening was then closed at the fascial layer with 2 separate running 0 Vicryl sutures. All port sites were injected with Exparel. The skin was reapproximated at all incision sites with 4-0 Monocryl subcuticular closures. Dermabond was applied to the skin. The patient tolerated the procedure well and without complications. He was able to be extubated and transferred to the recovery unit in satisfactory condition.   Moody Bruins MD

## 2012-05-04 NOTE — Anesthesia Postprocedure Evaluation (Signed)
  Anesthesia Post-op Note  Patient: [name] [name]  Procedure(s) Performed: Procedure(s) (LRB): ROBOT ASSITED LAPAROSCOPIC NEPHROURETERECTOMY (Right)  Patient Location: PACU  Anesthesia Type: General  Level of Consciousness: awake and alert   Airway and Oxygen Therapy: Patient Spontanous Breathing  Post-op Pain: mild  Post-op Assessment: Post-op Vital signs reviewed, Patient's Cardiovascular Status Stable, Respiratory Function Stable, Patent Airway and No signs of Nausea or vomiting  Post-op Vital Signs: stable  Complications: No apparent anesthesia complications

## 2012-05-04 NOTE — Progress Notes (Signed)
Patient ID: Caspar Favila, male   DOB: Jan 07, 1941, 71 y.o.   MRN: [id number]  Post-op note  Subjective: The patient is doing well.  No complaints.  Objective: Vital signs in last 24 hours: Temp:  [97.8 F (36.6 C)-98.2 F (36.8 C)] 97.9 F (36.6 C) (06/20 1352) Pulse Rate:  [60-79] 79  (06/20 1352) Resp:  [12-22] 18  (06/20 1352) BP: (125-169)/(66-90) 149/69 mmHg (06/20 1352) SpO2:  [100 %] 100 % (06/20 1352) Weight:  [92.2 kg (203 lb 4.2 oz)] 92.2 kg (203 lb 4.2 oz) (06/20 1400)  Intake/Output from previous day:   Intake/Output this shift: Total I/O In: 4120 [I.V.:3970; IV Piggyback:150] Out: 1068 [Urine:860; Drains:148; Other:50; Blood:10]  Physical Exam:  General: Alert and oriented. Abdomen: Soft, Nondistended. Incisions: Clean and dry.  Lab Results:  Basename 05/04/12 1220  HGB 13.3  HCT 40.4    Assessment/Plan: POD#0   1) Continue to monitor   Moody Bruins. MD   LOS: 0 days   Joelene Barriere,LES 05/04/2012, 6:40 PM

## 2012-05-04 NOTE — Discharge Instructions (Signed)

## 2012-05-04 NOTE — Interval H&P Note (Signed)
History and Physical Interval Note:  05/04/2012 7:22 AM  [name] [name]  has presented today for surgery, with the diagnosis of UROTHELIAL CARCINOMA OF RIGHT URETER  The various methods of treatment have been discussed with the patient and family. After consideration of risks, benefits and other options for treatment, the patient has consented to  Procedure(s) (LRB): ROBOT ASSITED LAPAROSCOPIC NEPHROURETERECTOMY (Right) as a surgical intervention .  The patient's history has been reviewed, patient examined, no change in status, stable for surgery.  I have reviewed the patients' chart and labs.  Questions were answered to the patient's satisfaction.     Mihika Surrette,LES

## 2012-05-04 NOTE — Anesthesia Preprocedure Evaluation (Addendum)
Anesthesia Evaluation  Patient identified by MRN, date of birth, ID band Patient awake    Reviewed: Allergy & Precautions, H&P , NPO status , Patient's Chart, lab work & pertinent test results  Airway Mallampati: II TM Distance: >3 FB Neck ROM: Full    Dental  (+) Chipped Rough edges upper incisors.:   Pulmonary pneumonia ,  breath sounds clear to auscultation  Pulmonary exam normal       Cardiovascular + CAD and + Past MI + dysrhythmias Rhythm:Regular Rate:Normal     Neuro/Psych negative neurological ROS  negative psych ROS   GI/Hepatic negative GI ROS, Neg liver ROS,   Endo/Other  negative endocrine ROS  Renal/GU negative Renal ROS  negative genitourinary   Musculoskeletal negative musculoskeletal ROS (+)   Abdominal   Peds negative pediatric ROS (+)  Hematology negative hematology ROS (+)   Anesthesia Other Findings   Reproductive/Obstetrics negative OB ROS                          Anesthesia Physical Anesthesia Plan  ASA: III  Anesthesia Plan: General   Post-op Pain Management:    Induction: Intravenous  Airway Management Planned: Oral ETT  Additional Equipment:   Intra-op Plan:   Post-operative Plan: Extubation in OR  Informed Consent: I have reviewed the patients History and Physical, chart, labs and discussed the procedure including the risks, benefits and alternatives for the proposed anesthesia with the patient or authorized representative who has indicated his/her understanding and acceptance.   Dental advisory given  Plan Discussed with: CRNA  Anesthesia Plan Comments:         Anesthesia Quick Evaluation

## 2012-05-05 LAB — BASIC METABOLIC PANEL
Calcium: 8.2 mg/dL — ABNORMAL LOW (ref 8.4–10.5)
Chloride: 100 mEq/L (ref 96–112)
Creatinine, Ser: 1.48 mg/dL — ABNORMAL HIGH (ref 0.50–1.35)
GFR calc Af Amer: 53 mL/min — ABNORMAL LOW (ref >90)
Sodium: 134 mEq/L — ABNORMAL LOW (ref 135–145)

## 2012-05-05 LAB — HEMOGLOBIN AND HEMATOCRIT, BLOOD
HCT: 36.1 % — ABNORMAL LOW (ref 39.0–52.0)
Hemoglobin: 11.6 g/dL — ABNORMAL LOW (ref 13.0–17.0)

## 2012-05-05 MED ORDER — BISACODYL 10 MG RE SUPP
10.0000 mg | Freq: Once | RECTAL | Status: AC
  Administered 2012-05-05: 10 mg via RECTAL
  Filled 2012-05-05: qty 1

## 2012-05-05 MED ORDER — HYDROCODONE-ACETAMINOPHEN 5-325 MG PO TABS: 1.0000 | ORAL_TABLET | Freq: Four times a day (QID) | ORAL | Status: AC | PRN

## 2012-05-05 MED ORDER — SIMETHICONE 80 MG PO CHEW
80.0000 mg | CHEWABLE_TABLET | Freq: Four times a day (QID) | ORAL | Status: DC | PRN
  Administered 2012-05-06: 80 mg via ORAL
  Filled 2012-05-05 (×2): qty 1

## 2012-05-05 MED ORDER — HYDROCODONE-ACETAMINOPHEN 5-325 MG PO TABS
1.0000 | ORAL_TABLET | Freq: Four times a day (QID) | ORAL | Status: DC | PRN
  Administered 2012-05-06: 2 via ORAL
  Administered 2012-05-06: 1 via ORAL
  Administered 2012-05-06: 2 via ORAL
  Administered 2012-05-07: 1 via ORAL
  Administered 2012-05-07 – 2012-05-08 (×3): 2 via ORAL
  Filled 2012-05-05 (×4): qty 2
  Filled 2012-05-05: qty 1
  Filled 2012-05-05 (×2): qty 2

## 2012-05-05 NOTE — Progress Notes (Signed)
Patient ID: [name] [name], male   DOB: [birth year], 71 y.o.   MRN: [id number]  1 Day Post-Op Subjective: Pt doing well. C/O appropriate post-op incisional pain with movement.  HR bradycardic and irregular c/w history of atrial fibrillation but he has been asymptomatic and stable. HR is normally low at baseline.  No nausea or vomiting overnight. No flatus.  Objective: Vital signs in last 24 hours: Temp:  [97.5 F (36.4 C)-97.9 F (36.6 C)] 97.8 F (36.6 C) (06/21 0602) Pulse Rate:  [48-79] 48  (06/21 0602) Resp:  [12-22] 18  (06/21 0602) BP: (115-169)/(66-90) 146/68 mmHg (06/21 0602) SpO2:  [98 %-100 %] 98 % (06/21 0602) Weight:  [92.2 kg (203 lb 4.2 oz)] 92.2 kg (203 lb 4.2 oz) (06/20 1400)  Intake/Output from previous day: 06/20 0701 - 06/21 0700 In: 4850 [P.O.:480; I.V.:3970; IV Piggyback:400] Out: 1893 [Urine:1610; Drains:223; Blood:10] Intake/Output this shift:    Physical Exam:  General: Alert and oriented CV: Irregular, bradycardic Lungs: Clear Abdomen: Soft, ND Incisions: C/D/I Ext: NT, No erythema  Lab Results:  Basename 05/05/12 0430 05/04/12 1220  HGB 11.6* 13.3  HCT 36.1* 40.4   BMET  Basename 05/05/12 0430 05/04/12 1220  NA 134* 138  K 4.3 4.4  CL 100 102  CO2 26 24  GLUCOSE 146* 120*  BUN 13 13  CREATININE 1.48* 1.16  CALCIUM 8.2* 9.0     Studies/Results: No results found.  Assessment/Plan: - Ambulate at last 4 times today, IS use - Dulcolax - Continue foley catheter (will be discharged with catheter) - Follow renal function and Hgb - Advance diet as tolerated - Continue telemetry   LOS: 1 day   [name],LES 05/05/2012, 7:01 AM

## 2012-05-05 NOTE — Progress Notes (Addendum)
Patient had 2.37 seconds pause. Patient was asleep and asymptomatic, atrial fib on the monitor. HR at 40's-50's while patient was asleep. HR goes back up to 50's-60's when awake. On call NP Myrtie Soman was made aware. We will continue to monitor patient.

## 2012-05-05 NOTE — Progress Notes (Signed)
Patient ID: [name] [name], male   DOB: 07-03-1941, 71 y.o.   MRN: [id number]  Pt has had a rough day.  He has bloating and some nausea.  No flatus and no vomiting.  He has been ambulating relatively well.  Path revealed carcinoma in situ of the right ureter.  I reviewed these results with [name] and his family.  -- Will check drain creatinine and drain can be removed tomorrow if c/w serum. -- Pt likely will not be ready for discharge until Sunday -- Plan is to D/C with catheter when ready and f/u with Dr. Brunilda Payor next week for a cystogram.

## 2012-05-06 LAB — BASIC METABOLIC PANEL
BUN: 13 mg/dL (ref 6–23)
CO2: 27 mEq/L (ref 19–32)
Chloride: 100 mEq/L (ref 96–112)
Creatinine, Ser: 1.58 mg/dL — ABNORMAL HIGH (ref 0.50–1.35)

## 2012-05-06 LAB — CREATININE, FLUID (PLEURAL, PERITONEAL, JP DRAINAGE)

## 2012-05-06 MED ORDER — BISACODYL 10 MG RE SUPP
10.0000 mg | Freq: Once | RECTAL | Status: AC
  Administered 2012-05-06: 10 mg via RECTAL
  Filled 2012-05-06: qty 1

## 2012-05-06 NOTE — Progress Notes (Signed)
2 Days Post-Op Subjective: Patient reports that he feels a little bit better this morning. He still has not passed much flatus or had a bowel movement  Objective: Vital signs in last 24 hours: Temp:  [98 F (36.7 C)-98.5 F (36.9 C)] 98 F (36.7 C) (06/22 0700) Pulse Rate:  [50-87] 82  (06/22 0700) Resp:  [18] 18  (06/22 0700) BP: (121-174)/(60-88) 174/78 mmHg (06/22 0700) SpO2:  [94 %-97 %] 95 % (06/22 0700)  Intake/Output from previous day: 06/21 0701 - 06/22 0700 In: 2952.5 [P.O.:1180; I.V.:1772.5] Out: 3552 [Urine:3370; Drains:182] Intake/Output this shift: Total I/O In: 240 [P.O.:240] Out: 200 [Urine:200]  Physical Exam:  Constitutional: Vital signs reviewed. WD WN in NAD   Abdomen is slightly distended. Incisions are all healing well without evidence of infection. Bowel sounds are diminished. Appropriate tenderness noted.  Lab Results:  Basename 05/06/12 0529 05/05/12 0430 05/04/12 1220  HGB 13.2 11.6* 13.3  HCT 40.8 36.1* 40.4   BMET  Basename 05/06/12 0529 05/05/12 0430  NA 136 134*  K 4.3 4.3  CL 100 100  CO2 27 26  GLUCOSE 136* 146*  BUN 13 13  CREATININE 1.58* 1.48*  CALCIUM 8.9 8.2*   No results found for this basename: LABPT:3,INR:3 in the last 72 hours No results found for this basename: LABURIN:1 in the last 72 hours Results for orders placed during the hospital encounter of 04/27/12  SURGICAL PCR SCREEN     Status: Normal   Collection Time   04/27/12 10:13 AM      Component Value Range Status Comment   MRSA, PCR NEGATIVE  NEGATIVE Final    Staphylococcus aureus NEGATIVE  NEGATIVE Final    Creatinine of drain fluid is 1.6  Studies/Results: No results found.  Assessment/Plan:   Postoperative day 2 laparoscopic nephroureterectomy. Bowel function is slow, he is otherwise doing well.    We will gradually advance his diet. We will give him a suppository of Dulcolax today.   LOS: 2 days   Marcine Matar M 05/06/2012, 9:18 AM

## 2012-05-06 NOTE — Progress Notes (Signed)
Advanced pt to solid foods at pt's request.  Gave him a few crackers with peanut butter and apple juice.  Pt tolerated well.  Afterwards, pt wanted to get up and go to the bathroom.  Pt had flatus and a small, liquid, light brown BM.  Pt stated that he felt some better after going to the bathroom.

## 2012-05-07 MED ORDER — BELLADONNA ALKALOIDS-OPIUM 16.2-60 MG RE SUPP
1.0000 | Freq: Four times a day (QID) | RECTAL | Status: DC | PRN
  Administered 2012-05-07: 1 via RECTAL

## 2012-05-07 MED ORDER — BELLADONNA ALKALOIDS-OPIUM 16.2-60 MG RE SUPP
RECTAL | Status: AC
  Filled 2012-05-07: qty 1

## 2012-05-07 NOTE — Progress Notes (Deleted)
05-07-12 NSG: Pt iv has been pulled out. Ivt in to replace iv at 0715 and noted pt who is sitting up in chair is cold, clammy and diaphoretic. Her CBG is 203 currently. Has just finished eating a snack of cherrios and milk per her request. Denies pain. No nausea. Is alert and oriented at this time. bp at 0715 is 91/63, HR 92, sat is 94% on room air, rr is 18. Telemetry monitor reports there have been no changes in her telemetry since admission and remains SR in the 80's and 90's. IVT replaces the ivt and ivf are restarted. bp is now 101/69, hr is 88, o2 sats are 98% on ra, rr is 18, and temp is 97.7. Pt is not longer cold, clammy or diaphoretic and is requesting to go back to bed. Pt is able to move with a steadier gait than 2 hours ago. Labs reviewed. Breakfast ordered. Pt instructed to notify rn if she feels like this again or feels any changes. Verbalizes understanding. Report given to oncoming rn dawn.   

## 2012-05-07 NOTE — Progress Notes (Signed)
3 Days Post-Op Subjective: Patient reports that he is feeling some better, but not read to go home yet. He is having flatus and bowel movements.  Objective: Vital signs in last 24 hours: Temp:  [97.8 F (36.6 C)-98.8 F (37.1 C)] 97.8 F (36.6 C) (06/22 2129) Pulse Rate:  [66-75] 68  (06/22 2129) Resp:  [18-20] 18  (06/22 2129) BP: (123-161)/(75-89) 123/75 mmHg (06/22 2129) SpO2:  [96 %-97 %] 96 % (06/22 2129)  Intake/Output from previous day: 06/22 0701 - 06/23 0700 In: 1740 [P.O.:1440; I.V.:300] Out: 4677 [Urine:4500; Drains:177] Intake/Output this shift:    Physical Exam:  Constitutional: Vital signs reviewed. WD WN in NAD   Eyes: PERRL, No scleral icterus.   Cardiovascular: RRR Pulmonary/Chest: Normal effort Abdominal: Soft. Non-tender, non-distended, bowel sounds are normal, no masses, organomegaly, or guarding present. Incisions are healing well without evidence of infection  Urine is clear yellow   Lab Results:  Basename 05/06/12 0529 05/05/12 0430 05/04/12 1220  HGB 13.2 11.6* 13.3  HCT 40.8 36.1* 40.4   BMET  Basename 05/06/12 0529 05/05/12 0430  NA 136 134*  K 4.3 4.3  CL 100 100  CO2 27 26  GLUCOSE 136* 146*  BUN 13 13  CREATININE 1.58* 1.48*  CALCIUM 8.9 8.2*   No results found for this basename: LABPT:3,INR:3 in the last 72 hours No results found for this basename: LABURIN:1 in the last 72 hours Results for orders placed during the hospital encounter of 04/27/12  SURGICAL PCR SCREEN     Status: Normal   Collection Time   04/27/12 10:13 AM      Component Value Range Status Comment   MRSA, PCR NEGATIVE  NEGATIVE Final    Staphylococcus aureus NEGATIVE  NEGATIVE Final     Studies/Results: No results found.  Assessment/Plan:   Postoperative day #3 from laparoscopic nephroureterectomy. Overall, the patient is doing well. We will saline lock his IV. Possible discharge on 6/24   LOS: 3 days   Marcine Matar M 05/07/2012, 7:08 AM

## 2012-05-07 NOTE — Progress Notes (Signed)
05-07-12 06:21  NSG:  Pt has 2.27 pause, is asymptomatic, vss.  This is not his first pause and he has had at least 2 others that were longer.  Pt returns to afib rate in the 70's.  Will continue to monitor

## 2012-05-08 LAB — BASIC METABOLIC PANEL
CO2: 28 mEq/L (ref 19–32)
Chloride: 97 mEq/L (ref 96–112)
Potassium: 3.9 mEq/L (ref 3.5–5.1)
Sodium: 136 mEq/L (ref 135–145)

## 2012-05-08 LAB — CBC
HCT: 38.9 % — ABNORMAL LOW (ref 39.0–52.0)
MCV: 90.3 fL (ref 78.0–100.0)
Platelets: 162 10*3/uL (ref 150–400)
RBC: 4.31 MIL/uL (ref 4.22–5.81)
WBC: 9.9 10*3/uL (ref 4.0–10.5)

## 2012-05-08 LAB — DIFFERENTIAL
Eosinophils Relative: 2 % (ref 0–5)
Lymphocytes Relative: 8 % — ABNORMAL LOW (ref 12–46)
Lymphs Abs: 0.8 10*3/uL (ref 0.7–4.0)
Monocytes Absolute: 0.9 10*3/uL (ref 0.1–1.0)

## 2012-05-08 NOTE — Progress Notes (Signed)
4 Days Post-Op Subjective: Patient reports he needs to urinate and cannot. Foley not draining well. Bladder scan = 1000 ml by nurse and 650 cc by me but clear urine in foley tubing.    Objective: Vital signs in last 24 hours: Temp:  [97.4 F (36.3 C)-98.2 F (36.8 C)] 97.6 F (36.4 C) (06/24 0552) Pulse Rate:  [74-86] 86  (06/24 0552) Resp:  [16-18] 18  (06/24 0552) BP: (133-145)/(82-88) 133/84 mmHg (06/24 0552) SpO2:  [94 %-98 %] 94 % (06/24 0552)  Intake/Output from previous day: 06/23 0701 - 06/24 0700 In: 2137.5 [P.O.:1320; I.V.:817.5] Out: 2121 [Urine:1950; Drains:170; Stool:1] Intake/Output this shift:    Physical Exam:  NAD A&O x 3 Abd - soft, NT; bladder distended in midline --> re-examine after below. Bladder non-palpable. Abd soft, NT, incisions C/D/I without exudate or induration GU - foley will nit irrigate, foley not mobile and hanging out too far. Balloon deflated, foley advanced into bladder and balloon reinflated, seated at bladder neck. Foley drains well now and irrigates normally.  Lab Results:  Basename 05/08/12 0740 05/06/12 0529  HGB 13.3 13.2  HCT 38.9* 40.8   BMET  Basename 05/08/12 0740 05/06/12 0529  NA 136 136  K 3.9 4.3  CL 97 100  CO2 28 27  GLUCOSE 161* 136*  BUN 23 13  CREATININE 1.44* 1.58*  CALCIUM 9.0 8.9    Assessment/Plan:  POD#4 - foley repositioned and draining. H/H stable. Cr down to 1.44.     home later today if ambulating, tolerating regular diet and pain controlled.    LOS: 4 days   Antony Haste 05/08/2012, 8:22 AM

## 2012-05-08 NOTE — Progress Notes (Signed)
Pt complains of urge to urinate. Foley bag appears to be draining unobstructed, emptied clearyellow urine from bag. I gave pt 2 Norco for pain as he requested. I also walked him in an effort to bring down urine; to no avail. Performed bladder scan which read >875 mls. Called Dr. Mena Goes with info. Instructed me to irrigate with 30ml saline, which I did. There was no return. He will be up to floor very shortly to assess pt.

## 2012-05-08 NOTE — Discharge Summary (Signed)
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 Briefly, [name] [name] is a 71 y.o. year old patient with the following urologic history:  1) Right hydronephrosis / right ureteral stricture: He apparently has a remote history of right hydronephrosis and had a ureteral stent placed in [place] 20 years ago which was subsequently removed. He had a CT scan in 2009 which did not demonstrate any right hydronephrosis or hydroureter. He had a CT scan performed in March 2013 for other reasons which demonstrated severe right hydroureteronephrosis with dilation down to a distinct transition point in the mid ureter. This was a change from his prior scan in 2009. No definite mass or extrinsic cause for this finding was present. He was taken to the OR by Dr. Brunilda Payor on 02/15/12 and retrograde pyelography was consistent with the CT findings of a short narrowed stricture with no definite filling defects. An attempt to perform ureteroscopy was unsuccessful and a ureteral stent was placed. He has been asymptomatic. He was initially seen by me in consultation in April 2013 and underwent repeat ureteroscopy on 03/23/12 after an initial period of passive dilation with a stent. I was able to perform ureteroscopy which revealed a 1.5 cm stricture with no evidence to suggest obvious malignancy visually. A brush biopsy was performed. A nuclear medicine renal scan has confirmed preserved function of the right kidney.  2) Left renal cyst: He has a history of a large left renal cyst and underwent percutaneous aspiration and ethanol sclerosis in 2009. This cyst was noted to have reaccumulated on imaging in March 2013.  3) R testicular pain: He has a history of right lower quadrant and right testicular pain  which had improved after treatment with doxycycline in 2013.  ** He has a significant cardiac history including a prior MI and cardiac stent placement as well as atrial fibrillation. He is on ASA 325 mg but does not take Plavix and does not take Coumadin due to difficulty with regulation of his INR in the past. Although, he has listed IV contrast as an allergy, he does not have a true allergy but rather developed contrast-induced nephropathy during a cardiac catheterization in the past which resolved.  Interval history:  Recent endoscopic evaluation which included ureteroscopy and brush biopsy of his ureteral stricture. His stricture was noted to be approximately 1.5-2 cm in total length. 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 He has tolerated his procedure well and has recovered without difficulty   Hospital Course: Pt was admitted and taken to the OR on 05/04/12 for right robotic assisted laparoscopic nephroureterectomy.  Pt tolerated the procedure well and was hemodynamically stable immediately post op.  He was extubated without complication and woke up from anesthesia neurologically intact.  He was transferred to the floor without difficulty.  His vitals remained stable throughout the hospital course.  His bowel function returned and he was able to tolerate a regular diet.  He was also able to ambulate without difficulty.  His foley has remained in place and will be kept until after he has been evaled as an outpatient with cystogram.  On 05/08/12 pt complained of lower abdominal fullness and feeling the need to urinate.  Bladder scan revealed 1000cc.  Dr. Mena Goes evaled the pt and found that the foley  balloon was pulled down in to the prostate and was not draining.  He re-positioned the foley which then began to drain appropriately and flushed easily.  Pt was monitored for the morning and then felt stable for discharge on POD #4 with his pain well  controlled.    Laboratory values:  Basename 05/08/12 0740 05/06/12 0529  HGB 13.3 13.2  HCT 38.9* 40.8    Basename 05/08/12 0740 05/06/12 0529  CREATININE 1.44* 1.58*   Pathology: carcinoma in situ of the right ureter  Disposition: Home  Discharge instruction: The patient was instructed to be ambulatory but told to refrain from heavy lifting, strenuous activity, or driving. He will maintain his foley until after evaluation as an outpt with cystogram.   Discharge medications:  Medication List  As of 05/08/2012  1:21 PM   START taking these medications         HYDROcodone-acetaminophen 5-325 MG per tablet   Commonly known as: NORCO   Take 1-2 tablets by mouth every 6 (six) hours as needed for pain.         CONTINUE taking these medications         aspirin 325 MG tablet      atorvastatin 40 MG tablet   Commonly known as: LIPITOR      calcitRIOL 0.25 MCG capsule   Commonly known as: ROCALTROL      Fish Oil 1200 MG Caps      furosemide 40 MG tablet   Commonly known as: LASIX      multivitamin with minerals Tabs      NIACIN FLUSH FREE 500 MG Caps   Generic drug: Inositol Niacinate      NITROSTAT 0.4 MG SL tablet   Generic drug: nitroGLYCERIN   DISSOLVE ONE TABLET UNDER THE TONGUE EVERY 5 MINUTES AS NEEDED FOR CHEST PAIN.  DO NOT EXCEED A TOTAL OF 3 DOSES IN 15 MINUTES      Potassium Gluconate 550 MG Tabs          Where to get your medications    These are the prescriptions that you need to pick up.   You may get these medications from any pharmacy.         HYDROcodone-acetaminophen 5-325 MG per tablet            Followup:  Follow-up Information    Follow up with NESI,MARC-HENRY, MD on 05/10/2012. (10:00)    Contact information:   [address] [facility] [place] [address] [phone]

## 2012-08-16 ENCOUNTER — Encounter: Payer: Self-pay | Admitting: Cardiovascular Disease

## 2012-08-16 ENCOUNTER — Ambulatory Visit (INDEPENDENT_AMBULATORY_CARE_PROVIDER_SITE_OTHER): Payer: Medicare Other | Admitting: Cardiovascular Disease

## 2012-08-16 VITALS — BP 130/68 | HR 58 | Ht 72.0 in | Wt 194.0 lb

## 2012-08-16 DIAGNOSIS — I4891 Unspecified atrial fibrillation: Secondary | ICD-10-CM

## 2012-08-16 DIAGNOSIS — I2510 Atherosclerotic heart disease of native coronary artery without angina pectoris: Secondary | ICD-10-CM

## 2012-08-16 NOTE — Patient Instructions (Addendum)
Your physician recommends that you continue on your current medications as directed. Please refer to the Current Medication list given to you today.   Your physician wants you to follow-up in: ONE YEAR WITH DR. Theodoro Parma will receive a reminder letter in the mail two months in advance. If you don't receive a letter, please call our office to schedule the follow-up appointment.

## 2012-08-16 NOTE — Progress Notes (Signed)
HPI:  71 year old gentleman presenting for followup evaluation. The patient has coronary artery disease, permanent atrial fibrillation, and hyperlipidemia. He initially presented with a non-ST elevation infarction in 2008. He was treated with a drug-eluting stent in the proximal and mid LAD. His LV function has been normal. He has declined anticoagulation and we have treated him with aspirin long-term.  He continues to feel well. The patient denies chest pain, dyspnea, palpitations, lightheadedness, or syncope. He continues to followup prudent diet and exercise regularly without exertional symptoms.  Since I have seen him last, he was diagnosed with carcinoma in situ of the right ureter. He underwent a right laparoscopic nephroureterectomy.  Outpatient Encounter Prescriptions as of 08/16/2012  Medication Sig Dispense Refill  . aspirin 325 MG tablet Take 325 mg by mouth daily after breakfast.       . atorvastatin (LIPITOR) 40 MG tablet Take 40 mg by mouth every evening.       . calcitRIOL (ROCALTROL) 0.25 MCG capsule Take 0.25 mcg by mouth daily after breakfast.       . furosemide (LASIX) 40 MG tablet Take 40 mg by mouth daily after breakfast.      . Inositol Niacinate (NIACIN FLUSH FREE) 500 MG CAPS Take 1 capsule by mouth daily.       . Multiple Vitamin (MULITIVITAMIN WITH MINERALS) TABS Take 1 tablet by mouth daily.      Marland Kitchen NITROSTAT 0.4 MG SL tablet DISSOLVE ONE TABLET UNDER THE TONGUE EVERY 5 MINUTES AS NEEDED FOR CHEST PAIN.  DO NOT EXCEED A TOTAL OF 3 DOSES IN 15 MINUTES  25 each  12  . Omega-3 Fatty Acids (FISH OIL) 1200 MG CAPS Take 1 capsule by mouth 2 (two) times daily.      . Potassium Gluconate 550 MG TABS Take 550 mg by mouth daily.       Facility-Administered Encounter Medications as of 08/16/2012  Medication Dose Route Frequency Provider Last Rate Last Dose  . ceFAZolin (ANCEF) IVPB 1 g/50 mL premix  1 g Intravenous 30 min Pre-Op Lindaann Slough, MD        Allergies  Allergen  Reactions  . Contrast Media (Iodinated Diagnostic Agents)     Contrast-induced nephropathy requiring dialysis in 02/2007.    Past Medical History  Diagnosis Date  . Contrast dye induced nephropathy     Hx ARF secondary to contrast nephropathy  . Dyslipidemia   . Epididymitis   . Pneumonia     hx of   . Pleurisy     2012  . Arthritis     shoulders and ribs   . 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 Urothelial cancer     Dr. Laverle Patter  . History of blood transfusion   . Atrial fibrillation     atrial fib/ LOV Wende Mott PA 04/11/12 EPIC, - CHEST X RAY, EKG 5/13 EPIC  . CAD (coronary artery disease)      s/p NSTEMI 04/08;  LHC 02/2007: Proximal LAD 75%, mid LAD 99%, proximal RCA 25%.  PCI:  Cypher DES to the proximal and mid LAD.  Last nuclear study 11/2011 (after a trip to the ED with CP): EF 58%, low risk study with small inferior wall infarct at the mid and basal level, no ischemia.  Last echo 01/2010: Mild LVH, EF 55-60%, mild AI, mild MR, severely dilated LA and RA  . Myocardial infarction 2008    ROS: Negative except as per HPI  BP 130/68  Pulse 58  Ht 6' (1.829 m)  Wt 87.998 kg (194 lb)  BMI 26.31 kg/m2  PHYSICAL EXAM: Pt is alert and oriented, NAD HEENT: normal Neck: JVP - normal, carotids 2+= without bruits Lungs: CTA bilaterally CV: RRR without murmur or gallop Abd: soft, NT, Positive BS, no hepatomegaly Ext: no C/C/E, distal pulses intact and equal Skin: warm/dry no rash  EKG:  Atrial fibrillation heart rate 58 beats per minute, otherwise within normal limits.  ASSESSMENT AND PLAN: 1. Coronary atherosclerosis, native vessel. The patient remains stable without anginal symptoms. He will continue on his current medical program which includes aspirin for antiplatelet therapy and a statin drug for lipid-lowering. His atrial fibrillation is relatively slow and does not allow for a beta blocker.   2. Permanent atrial fibrillation. He remains on aspirin daily. His CHADS score equals  0.  3. Hyperlipidemia. We discussed the controversies surrounding the use of fish oil and niacin when there is background treatment with a statin drug. We're going to ask that his lab work be faxed over for review. For now he will continue his current regimen.

## 2012-08-18 ENCOUNTER — Other Ambulatory Visit: Payer: Self-pay | Admitting: Cardiovascular Disease

## 2012-08-30 NOTE — Addendum Note (Signed)
Addended by: Marrion Coy L on: 08/30/2012 09:53 AM   Modules accepted: Orders

## 2013-03-01 ENCOUNTER — Encounter: Payer: Self-pay | Admitting: Cardiovascular Disease

## 2013-06-21 ENCOUNTER — Other Ambulatory Visit (HOSPITAL_COMMUNITY): Payer: Self-pay | Admitting: Urology

## 2013-06-21 DIAGNOSIS — M899 Disorder of bone, unspecified: Secondary | ICD-10-CM

## 2013-07-02 ENCOUNTER — Encounter (HOSPITAL_COMMUNITY)
Admission: RE | Admit: 2013-07-02 | Discharge: 2013-07-02 | Disposition: A | Discharge status: Home / Self Care | Payer: Medicare Other | Source: Ambulatory Visit | Attending: Urology | Admitting: Urology

## 2013-07-02 DIAGNOSIS — M899 Disorder of bone, unspecified: Secondary | ICD-10-CM | POA: Insufficient documentation

## 2013-07-02 MED ORDER — TECHNETIUM TC 99M MEDRONATE IV KIT
25.0000 | PACK | Freq: Once | INTRAVENOUS | Status: AC | PRN
  Administered 2013-07-02: 25 via INTRAVENOUS

## 2013-07-26 ENCOUNTER — Other Ambulatory Visit: Payer: Self-pay | Admitting: Gastroenterology

## 2013-08-20 ENCOUNTER — Encounter: Payer: Self-pay | Admitting: Cardiovascular Disease

## 2013-08-20 ENCOUNTER — Ambulatory Visit (INDEPENDENT_AMBULATORY_CARE_PROVIDER_SITE_OTHER): Payer: Medicare Other | Admitting: Cardiovascular Disease

## 2013-08-20 VITALS — BP 130/72 | HR 51 | Ht 72.0 in | Wt 204.0 lb

## 2013-08-20 DIAGNOSIS — I4891 Unspecified atrial fibrillation: Secondary | ICD-10-CM

## 2013-08-20 NOTE — Progress Notes (Signed)
HPI:  72 year-old gentleman returning for follow-up evaluation. The patient has coronary artery disease, permanent atrial fibrillation, and hyperlipidemia. He initially presented with a non-ST elevation infarction in 2008. He was treated with a drug-eluting stent in the proximal and mid LAD. His LV function has been normal. He has declined anticoagulation and we have treated him with aspirin long-term.  Overall he is doing well. Continues to exercise at the gym without exertional complaints. No CP, dyspnea, edema, or palpitations. Compliant with medications. Still follows with Dr [name] [name] for mild renal disease. Labs followed by Dr [name] [name] who treats his hyperlipidemia. Other complaints include nocturnal foot cramps and urinary frequency.   Outpatient Encounter Prescriptions as of 08/20/2013  Medication Sig Dispense Refill  . aspirin 325 MG tablet Take 325 mg by mouth daily after breakfast.       . atorvastatin (LIPITOR) 40 MG tablet Take 40 mg by mouth every evening.       . calcitRIOL (ROCALTROL) 0.25 MCG capsule Take 0.25 mcg by mouth daily after breakfast.       . furosemide (LASIX) 40 MG tablet TAKE 1 TABLET BY MOUTH EVERY DAY  30 tablet  11  . Inositol Niacinate (NIACIN FLUSH FREE) 500 MG CAPS Take 1 capsule by mouth daily.       Marland Kitchen NITROSTAT 0.4 MG SL tablet DISSOLVE ONE TABLET UNDER THE TONGUE EVERY 5 MINUTES AS NEEDED FOR CHEST PAIN.  DO NOT EXCEED A TOTAL OF 3 DOSES IN 15 MINUTES  25 each  12  . Omega-3 Fatty Acids (FISH OIL) 1200 MG CAPS Take 1 capsule by mouth 2 (two) times daily.      . Potassium Gluconate 550 MG TABS Take 550 mg by mouth daily.      . [DISCONTINUED] Multiple Vitamin (MULITIVITAMIN WITH MINERALS) TABS Take 1 tablet by mouth daily.       Facility-Administered Encounter Medications as of 08/20/2013  Medication Dose Route Frequency Provider Last Rate Last Dose  . ceFAZolin (ANCEF) IVPB 1 g/50 mL premix  1 g Intravenous 30 min Pre-Op Lindaann Slough, MD         Allergies  Allergen Reactions  . Contrast Media [Iodinated Diagnostic Agents]     Contrast-induced nephropathy requiring dialysis in 02/2007.    Past Medical History  Diagnosis Date  . Contrast dye induced nephropathy     Hx ARF secondary to contrast nephropathy  . Dyslipidemia   . Epididymitis   . Pneumonia     hx of   . Pleurisy     2012  . Arthritis     shoulders and ribs   . 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 Urothelial cancer     Dr. Laverle [name]  . History of blood transfusion   . Atrial fibrillation     atrial fib/ LOV Wende Mott PA 04/11/12 EPIC, - CHEST X RAY, EKG 5/13 EPIC  . CAD (coronary artery disease)      s/p NSTEMI 04/08;  LHC 02/2007: Proximal LAD 75%, mid LAD 99%, proximal RCA 25%.  PCI:  Cypher DES to the proximal and mid LAD.  Last nuclear study 11/2011 (after a trip to the ED with CP): EF 58%, low risk study with small inferior wall infarct at the mid and basal level, no ischemia.  Last echo 01/2010: Mild LVH, EF 55-60%, mild AI, mild MR, severely dilated LA and RA  . Myocardial infarction 2008    ROS: Negative except as per HPI  Ht 6' (1.829 m)  Wt 204 lb (92.534 kg)  BMI 27.66 kg/m2  PHYSICAL EXAM: Pt is alert and oriented, NAD HEENT: normal Neck: JVP - normal, carotids 2+= without bruits Lungs: CTA bilaterally CV: irregular without murmur or gallop Abd: soft, NT, Positive BS, no hepatomegaly Ext: no C/C/E, distal pulses intact and equal Skin: warm/dry no rash  EKG:  Atrial fibrillation with slow ventricular response 51 bpm, otherwise within normal limits.  ASSESSMENT AND PLAN: 1. CAD, native vessel. Stable without anginal sx's at a good workload. No beta-blocker because of bradycardia. Continue ASA daily. Follow-up 12 months.  2. Atrial fibrillation, permanent. We again discussed consideration of anticoagulation which I have recommended  In the past for reduction in risk of thromboembolism. Gave him information on Eliquis which he will consider. He has  been unwilling to take anticoagulants in the past and prefers ASA.  3. Hyperlipidemia. Will request labs from Dr [name] [name]' office.  For follow-up I will see him back in 12 months.  [name] [name] 08/20/2013 9:28 AM

## 2013-08-20 NOTE — Patient Instructions (Signed)
Your physician wants you to follow-up in: 1 year You will receive a reminder letter in the mail two months in advance. If you don't receive a letter, please call our office to schedule the follow-up appointment.  Your physician recommends that you continue on your current medications as directed. Please refer to the Current Medication list given to you today.  Please review medication information on Eliquis

## 2013-08-21 ENCOUNTER — Encounter: Payer: Self-pay | Admitting: Cardiovascular Disease

## 2013-08-21 NOTE — Telephone Encounter (Signed)
New Problem:  Pt states he received a copy of a document yesterday at his visit. Pt states he reviewed it and would like some clarification on the medication section. Please advise

## 2013-08-21 NOTE — Telephone Encounter (Signed)
This encounter was created in error - please disregard.

## 2013-09-20 ENCOUNTER — Other Ambulatory Visit: Payer: Self-pay

## 2013-11-05 DIAGNOSIS — F5102 Adjustment insomnia: Secondary | ICD-10-CM | POA: Insufficient documentation

## 2013-11-08 ENCOUNTER — Other Ambulatory Visit: Payer: Self-pay | Admitting: Cardiovascular Disease

## 2014-07-29 ENCOUNTER — Ambulatory Visit (INDEPENDENT_AMBULATORY_CARE_PROVIDER_SITE_OTHER): Payer: Medicare HMO | Admitting: Diagnostic Neuroimaging

## 2014-07-29 ENCOUNTER — Encounter: Payer: Self-pay | Admitting: Diagnostic Neuroimaging

## 2014-07-29 VITALS — BP 141/76 | HR 50 | Ht 71.25 in | Wt 205.0 lb

## 2014-07-29 DIAGNOSIS — R51 Headache: Secondary | ICD-10-CM

## 2014-07-29 DIAGNOSIS — M531 Cervicobrachial syndrome: Secondary | ICD-10-CM

## 2014-07-29 DIAGNOSIS — H9209 Otalgia, unspecified ear: Secondary | ICD-10-CM

## 2014-07-29 DIAGNOSIS — M5481 Occipital neuralgia: Secondary | ICD-10-CM

## 2014-07-29 DIAGNOSIS — M542 Cervicalgia: Secondary | ICD-10-CM

## 2014-07-29 DIAGNOSIS — H9202 Otalgia, left ear: Secondary | ICD-10-CM

## 2014-07-29 NOTE — Progress Notes (Signed)
[facility]  PATIENT: [name] [name] DOB: 1940-12-07  REFERRING CLINICIAN: Cobb HISTORY FROM: patient and wife REASON FOR VISIT: new consult    HISTORICAL  CHIEF COMPLAINT:  Chief Complaint  Patient presents with  . Skin sensation    NP, paper referral, Rm 7    HISTORY OF PRESENT ILLNESS:   73 year old right-handed male with history of atrial fibrillation, heart attack, ureter cancer, here for evaluation of headache.  For past 1 month patient does note new onset headache consisting of occipital, neck, scalp pain and pressure. He started with left ear soreness and abnormal sensation. He also noticed some chin twitching at onset of headache. No photophobia, phonophobia, nausea, vomiting, vision changes, numbness or tingling. No triggering factors. Symptoms are worse when he exerts himself. Patient has had some scalp itching sensation.  REVIEW OF SYSTEMS: Full 14 system review of systems performed and notable only for not enough sleep.  ALLERGIES: Allergies  Allergen Reactions  . Contrast Media [Iodinated Diagnostic Agents]     Contrast-induced nephropathy requiring dialysis in 02/2007.    HOME MEDICATIONS: Outpatient Prescriptions Prior to Visit  Medication Sig Dispense Refill  . aspirin 325 MG tablet Take 325 mg by mouth daily after breakfast.       . atorvastatin (LIPITOR) 40 MG tablet Take 40 mg by mouth every evening.       . calcitRIOL (ROCALTROL) 0.25 MCG capsule Take 0.25 mcg by mouth daily after breakfast.       . furosemide (LASIX) 40 MG tablet TAKE 1 TABLET BY MOUTH EVERY DAY  30 tablet  11  . Inositol Niacinate (NIACIN FLUSH FREE) 500 MG CAPS Take 1 capsule by mouth daily.       Marland Kitchen NITROSTAT 0.4 MG SL tablet DISSOLVE ONE TABLET UNDER THE TONGUE EVERY 5 MINUTES AS NEEDED FOR CHEST PAIN.  DO NOT EXCEED A TOTAL OF 3 DOSES IN 15 MINUTES  25 each  12  . Omega-3 Fatty Acids (FISH OIL) 1200 MG CAPS Take 1 capsule by mouth 2 (two) times daily.      .  Potassium Gluconate 550 MG TABS Take 550 mg by mouth daily.       Facility-Administered Medications Prior to Visit  Medication Dose Route Frequency Provider Last Rate Last Dose  . ceFAZolin (ANCEF) IVPB 1 g/50 mL premix  1 g Intravenous 30 min Pre-Op Arvil Persons, MD        PAST MEDICAL HISTORY: Past Medical History  Diagnosis Date  . Contrast dye induced nephropathy     Hx ARF secondary to contrast nephropathy  . Dyslipidemia   . Epididymitis   . Pneumonia     hx of   . Pleurisy     2012  . Arthritis     shoulders and ribs   . 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 Urothelial cancer     Dr. [name]  . History of blood transfusion   . Atrial fibrillation     atrial fib/ LOV [name] PA 04/11/12 EPIC, - CHEST X RAY, EKG 5/13 EPIC  . CAD (coronary artery disease)      s/p NSTEMI 04/08;  [place] 02/2007: Proximal LAD 75%, mid LAD 99%, proximal RCA 25%.  PCI:  Cypher DES to the proximal and mid LAD.  Last nuclear study 11/2011 (after a trip to the ED with CP): EF 58%, low risk study with small inferior wall infarct at the mid and basal level, no ischemia.  Last echo 01/2010: Mild LVH, EF 55-60%, mild AI, mild MR, severely dilated LA and RA  . Myocardial infarction 2008    PAST SURGICAL HISTORY: Past Surgical History  Procedure Laterality Date  . Cystectomy  07/15/08  . Arm surgery      orif right elbow  . Ureter surgery    . Coronary stents     . Cystoscopy/retrograde/ureteroscopy  02/15/2012    Procedure: CYSTOSCOPY/RETROGRADE/URETEROSCOPY;  Surgeon: [name], MD;  Location: [facility];  Service: Urology;  Laterality: Right;  C-ARM  . Cystoscopy with ureteroscopy  03/23/2012    Procedure: CYSTOSCOPY WITH URETEROSCOPY;  Surgeon: [name], MD;  Location: [facility];  Service: Urology;  Laterality: Right;    **OR Room #8 requested**  C-ARM   . Cystoscopy with biopsy  03/23/2012    Procedure: CYSTOSCOPY WITH BIOPSY;  Surgeon: [name], MD;  Location: [facility];  Service: Urology;  Laterality:  Right;   BRUSH BIOPSY RIGHT URETERAL STENT  . Cardiac catheterization  2008    FAMILY HISTORY: Family History  Problem Relation Age of Onset  . Cancer Mother   . Coronary artery disease Father   . Heart attack Father   . Diabetes type II Mother   . Hyperlipidemia Mother     SOCIAL HISTORY:  History   Social History  . Marital Status: Married    Spouse Name: jackie    Number of Children: 3  . Years of Education: 12   Occupational History  . Retired    Social History Main Topics  . Smoking status: Former Smoker -- 2.00 packs/day for 10 years    Types: Cigarettes, Cigars    Quit date: 11/15/1981  . Smokeless tobacco: Never Used  . Alcohol Use: No  . Drug Use: No  . Sexual Activity: Not on file   Other Topics Concern  . Not on file   Social History Narrative   Lives in [place], [place] with wife. Exercising 3-4x/week.      PHYSICAL EXAM  Filed Vitals:   07/29/14 1101  BP: 141/76  Pulse: 50  Height: 5' 11.25" (1.81 m)  Weight: 205 lb (92.987 kg)    Not recorded    Body mass index is 28.38 kg/(m^2).  GENERAL EXAM: Patient is in no distress; well developed, nourished and groomed; neck is supple  CARDIOVASCULAR: Regular rate and rhythm, no murmurs, no carotid bruits; NO TEMPORAL ARTERY TENDERNESS; TEMPORAL ARTERIES PALPABLE AND SYMM  NEUROLOGIC: MENTAL STATUS: awake, alert, oriented to person, place and time, recent and remote memory intact, normal attention and concentration, language fluent, comprehension intact, naming intact, fund of knowledge appropriate CRANIAL NERVE: no papilledema on fundoscopic exam, pupils equal and reactive to light, visual fields full to confrontation, extraocular muscles intact, no nystagmus, facial sensation and strength symmetric, hearing intact, palate elevates symmetrically, uvula midline, shoulder shrug symmetric, tongue midline. MOTOR: normal bulk and tone, full strength in the BUE, BLE SENSORY: normal and symmetric to  light touch, pinprick, temperature, vibration  COORDINATION: finger-nose-finger, fine finger movements normal REFLEXES: deep tendon reflexes present and symmetric GAIT/STATION: narrow based gait; able to walk on toes, heels and tandem; romberg is negative    DIAGNOSTIC DATA (LABS, IMAGING, TESTING) - I reviewed patient records, labs, notes, testing and imaging myself where available.  Lab Results  Component Value Date   WBC 9.9 05/08/2012   HGB 13.3 05/08/2012   HCT 38.9* 05/08/2012   MCV 90.3 05/08/2012   PLT 162 05/08/2012  Component Value Date/Time   NA 136 05/08/2012 0740   K 3.9 05/08/2012 0740   CL 97 05/08/2012 0740   CO2 28 05/08/2012 0740   GLUCOSE 161* 05/08/2012 0740   BUN 23 05/08/2012 0740   CREATININE 1.44* 05/08/2012 0740   CALCIUM 9.0 05/08/2012 0740   PROT 7.3 01/27/2010 0937   ALBUMIN 4.2 01/27/2010 0937   AST 20 01/27/2010 0937   ALT 20 01/27/2010 0937   ALKPHOS 54 01/27/2010 0937   BILITOT 0.9 01/27/2010 0937   GFRNONAA 47* 05/08/2012 0740   GFRAA 55* 05/08/2012 0740   Lab Results  Component Value Date   CHOL 109 01/27/2010   HDL 37.60* 01/27/2010   LDLCALC 48 01/27/2010   TRIG 115.0 01/27/2010   CHOLHDL 3 01/27/2010   No results found for this basename: HGBA1C   No results found for this basename: VITAMINB12   No results found for this basename: TSH     ASSESSMENT AND PLAN  73 y.o. year old male here with new onset headaches x 1 month. Some focal onset (left ear pain, chin twitching) and also worse with exertion. Will check addl testing to rule out serious potential etiologies.   Ddx: tension HA, occipital neuralgia, cervico-genic headache, temporal arteritis, secondary HA( structural, vascular)  PLAN: Orders Placed This Encounter  Procedures  . MR Cervical Spine W Wo Contrast  . MR Brain W Wo Contrast  . Sedimentation Rate  . C-reactive Protein   Return in about 3 months (around 10/28/2014).    Penni Bombard, MD 7/61/9509, 32:67  PM Certified in Neurology, Neurophysiology and Neuroimaging  [facility] [address], [place], [place] [address] [phone]

## 2014-07-29 NOTE — Patient Instructions (Addendum)
I will check MRI brain and neck and blood testing.

## 2014-07-30 LAB — SEDIMENTATION RATE: SED RATE: 5 mm/h (ref 0–30)

## 2014-07-30 LAB — C-REACTIVE PROTEIN: CRP: 1.5 mg/L (ref 0.0–4.9)

## 2014-08-09 ENCOUNTER — Ambulatory Visit
Admission: RE | Admit: 2014-08-09 | Discharge: 2014-08-09 | Disposition: A | Discharge status: Home / Self Care | Payer: Commercial Managed Care - HMO | Source: Ambulatory Visit | Attending: Diagnostic Neuroimaging | Admitting: Diagnostic Neuroimaging

## 2014-08-09 DIAGNOSIS — M542 Cervicalgia: Secondary | ICD-10-CM

## 2014-08-09 DIAGNOSIS — M5481 Occipital neuralgia: Secondary | ICD-10-CM

## 2014-08-09 DIAGNOSIS — R51 Headache: Secondary | ICD-10-CM

## 2014-08-09 DIAGNOSIS — H9202 Otalgia, left ear: Secondary | ICD-10-CM

## 2014-08-09 MED ORDER — GADOBENATE DIMEGLUMINE 529 MG/ML IV SOLN
20.0000 mL | Freq: Once | INTRAVENOUS | Status: AC | PRN
  Administered 2014-08-09: 20 mL via INTRAVENOUS

## 2014-08-14 ENCOUNTER — Telehealth: Payer: Self-pay | Admitting: Diagnostic Neuroimaging

## 2014-08-14 NOTE — Telephone Encounter (Signed)
Patient requesting MRI results.  Please call anytime and may leave detailed message on voicemail.

## 2014-08-14 NOTE — Telephone Encounter (Signed)
I called pt and wife. Reviewed results. Recommend conservative mgmt. Follow up in clinic. -VRP

## 2014-08-30 ENCOUNTER — Other Ambulatory Visit: Payer: Self-pay

## 2014-09-09 ENCOUNTER — Ambulatory Visit (INDEPENDENT_AMBULATORY_CARE_PROVIDER_SITE_OTHER): Payer: Medicare HMO | Admitting: Cardiovascular Disease

## 2014-09-09 ENCOUNTER — Encounter: Payer: Self-pay | Admitting: Cardiovascular Disease

## 2014-09-09 VITALS — BP 118/70 | HR 49 | Ht 71.25 in | Wt 204.4 lb

## 2014-09-09 DIAGNOSIS — I2510 Atherosclerotic heart disease of native coronary artery without angina pectoris: Secondary | ICD-10-CM

## 2014-09-09 DIAGNOSIS — I4891 Unspecified atrial fibrillation: Secondary | ICD-10-CM

## 2014-09-09 DIAGNOSIS — E782 Mixed hyperlipidemia: Secondary | ICD-10-CM

## 2014-09-09 NOTE — Progress Notes (Signed)
Background: The patient is followed for coronary artery disease and permanent atrial fibrillation. He initially presented with a non-ST elevation infarction in 2008. He was treated with a drug-eluting stent in the proximal and mid LAD. His LV function has been normal. He has declined anticoagulation and we have treated him with aspirin long-term.  HPI:  [name] [name] returns for follow-up evaluation. He is now 73 years old. These been under a lot of family stressors recently and we had lengthy discussion about this today. He has not had any specific cardiac symptoms. He denies chest pain, chest pressure, shortness of breath, heart palpitations, orthopnea, or PND. He's had mild leg swelling when he's been up on his feet a lot.  Studies:  Lipids 05/14/2014: Chol 144, Trig 79, HDL 41, LDL 84  Outpatient Encounter Prescriptions as of 09/09/2014  Medication Sig  . aspirin 325 MG tablet Take 325 mg by mouth daily after breakfast.   . atorvastatin (LIPITOR) 40 MG tablet Take 40 mg by mouth every evening.   . calcitRIOL (ROCALTROL) 0.25 MCG capsule Take 0.25 mcg by mouth daily after breakfast.   . furosemide (LASIX) 40 MG tablet TAKE 1 TABLET BY MOUTH EVERY DAY  . Inositol Niacinate (NIACIN FLUSH FREE) 500 MG CAPS Take 1 capsule by mouth daily.   Marland Kitchen NITROSTAT 0.4 MG SL tablet DISSOLVE ONE TABLET UNDER THE TONGUE EVERY 5 MINUTES AS NEEDED FOR CHEST PAIN.  DO NOT EXCEED A TOTAL OF 3 DOSES IN 15 MINUTES  . Omega-3 Fatty Acids (FISH OIL) 1200 MG CAPS Take 1 capsule by mouth 2 (two) times daily.  . vitamin B-12 (CYANOCOBALAMIN) 500 MCG tablet Take 500 mcg by mouth daily.  Marland Kitchen zolpidem (AMBIEN) 5 MG tablet Take 5 mg by mouth at bedtime as needed for sleep.     Allergies  Allergen Reactions  . Contrast Media [Iodinated Diagnostic Agents]     Contrast-induced nephropathy requiring dialysis in 02/2007.    Past Medical History  Diagnosis Date  . Contrast dye induced nephropathy     Hx ARF secondary to contrast  nephropathy  . Dyslipidemia   . Epididymitis   . Pneumonia     hx of   . Pleurisy     2012  . Arthritis     shoulders and ribs   . 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 Urothelial cancer     Dr. [name]  . History of blood transfusion   . Atrial fibrillation     atrial fib/ LOV [name] PA 04/11/12 EPIC, - CHEST X RAY, EKG 5/13 EPIC  . CAD (coronary artery disease)      s/p NSTEMI 04/08;  [place] 02/2007: Proximal LAD 75%, mid LAD 99%, proximal RCA 25%.  PCI:  Cypher DES to the proximal and mid LAD.  Last nuclear study 11/2011 (after a trip to the ED with CP): EF 58%, low risk study with small inferior wall infarct at the mid and basal level, no ischemia.  Last echo 01/2010: Mild LVH, EF 55-60%, mild AI, mild MR, severely dilated LA and RA  . Myocardial infarction 2008    family history includes Cancer in his mother; Coronary artery disease in his father; Diabetes type II in his mother; Heart attack in his father; Hyperlipidemia in his mother.   ROS: Negative except as per HPI  BP 118/70  Pulse 49  Ht 5' 11.25" (1.81 m)  Wt 204 lb 6.4 oz (92.715 kg)  BMI 28.30 kg/m2  PHYSICAL  EXAM: Pt is alert and oriented, NAD HEENT: normal Neck: JVP - normal, carotids 2+= without bruits Lungs: CTA bilaterally CV: Irregular without murmur or gallop Abd: soft, NT, Positive BS, no hepatomegaly Ext: no C/C/E, distal pulses intact and equal Skin: warm/dry no rash  EKG:  Atrial fibrillation with slow ventricular response, heart rate 49 bpm.   ASSESSMENT AND PLAN: 1. Coronary artery disease, native vessel, without symptoms of angina. The patient is stable and he will be continued on his current medical program. He is not on a beta blocker because of resting bradycardia. I will see him back in one year.  2. Permanent atrial fibrillation. He has declined anticoagulation after extensive discussion in the past. Will continue with aspirin along.  3. Hyperlipidemia. He is treated by atorvastatin by his primary  care physician.  For follow-up I will see him in one year.  Sherren Mocha, MD 09/09/2014 11:58 AM

## 2014-09-09 NOTE — Patient Instructions (Signed)
Your physician wants you to follow-up in: 1 YEAR with Dr Cooper.  You will receive a reminder letter in the mail two months in advance. If you don't receive a letter, please call our office to schedule the follow-up appointment.  Your physician recommends that you continue on your current medications as directed. Please refer to the Current Medication list given to you today.  

## 2014-09-12 ENCOUNTER — Encounter: Payer: Self-pay | Admitting: Cardiovascular Disease

## 2014-09-13 ENCOUNTER — Ambulatory Visit: Payer: Commercial Managed Care - HMO | Admitting: Cardiovascular Disease

## 2015-03-15 ENCOUNTER — Emergency Department (HOSPITAL_COMMUNITY)
Admission: EM | Admit: 2015-03-15 | Discharge: 2015-03-15 | Disposition: A | Discharge status: Home / Self Care | Payer: Medicare HMO | Attending: Emergency Medicine | Admitting: Emergency Medicine

## 2015-03-15 ENCOUNTER — Encounter (HOSPITAL_COMMUNITY): Payer: Self-pay | Admitting: Emergency Medicine

## 2015-03-15 DIAGNOSIS — M79644 Pain in right finger(s): Secondary | ICD-10-CM | POA: Diagnosis present

## 2015-03-15 DIAGNOSIS — M1990 Unspecified osteoarthritis, unspecified site: Secondary | ICD-10-CM | POA: Diagnosis not present

## 2015-03-15 DIAGNOSIS — Z7982 Long term (current) use of aspirin: Secondary | ICD-10-CM | POA: Insufficient documentation

## 2015-03-15 DIAGNOSIS — I252 Old myocardial infarction: Secondary | ICD-10-CM | POA: Insufficient documentation

## 2015-03-15 DIAGNOSIS — Z87448 Personal history of other diseases of urinary system: Secondary | ICD-10-CM | POA: Diagnosis not present

## 2015-03-15 DIAGNOSIS — I2510 Atherosclerotic heart disease of native coronary artery without angina pectoris: Secondary | ICD-10-CM | POA: Insufficient documentation

## 2015-03-15 DIAGNOSIS — L03011 Cellulitis of right finger: Secondary | ICD-10-CM | POA: Diagnosis not present

## 2015-03-15 DIAGNOSIS — Z9889 Other specified postprocedural states: Secondary | ICD-10-CM | POA: Diagnosis not present

## 2015-03-15 DIAGNOSIS — E785 Hyperlipidemia, unspecified: Secondary | ICD-10-CM | POA: Diagnosis not present

## 2015-03-15 DIAGNOSIS — Z8551 Personal history of malignant neoplasm of bladder: Secondary | ICD-10-CM | POA: Diagnosis not present

## 2015-03-15 DIAGNOSIS — Z23 Encounter for immunization: Secondary | ICD-10-CM | POA: Insufficient documentation

## 2015-03-15 DIAGNOSIS — Z87891 Personal history of nicotine dependence: Secondary | ICD-10-CM | POA: Insufficient documentation

## 2015-03-15 DIAGNOSIS — Z8709 Personal history of other diseases of the respiratory system: Secondary | ICD-10-CM | POA: Insufficient documentation

## 2015-03-15 DIAGNOSIS — Z8701 Personal history of pneumonia (recurrent): Secondary | ICD-10-CM | POA: Insufficient documentation

## 2015-03-15 DIAGNOSIS — Q6100 Congenital renal cyst, unspecified: Secondary | ICD-10-CM | POA: Diagnosis not present

## 2015-03-15 DIAGNOSIS — Z79899 Other long term (current) drug therapy: Secondary | ICD-10-CM | POA: Insufficient documentation

## 2015-03-15 MED ORDER — TETANUS-DIPHTH-ACELL PERTUSSIS 5-2.5-18.5 LF-MCG/0.5 IM SUSP
0.5000 mL | Freq: Once | INTRAMUSCULAR | Status: AC
  Administered 2015-03-15: 0.5 mL via INTRAMUSCULAR
  Filled 2015-03-15: qty 0.5

## 2015-03-15 MED ORDER — LIDOCAINE HCL (PF) 1 % IJ SOLN
INTRAMUSCULAR | Status: AC
  Administered 2015-03-15: 21:00:00
  Filled 2015-03-15: qty 5

## 2015-03-15 MED ORDER — HYDROCODONE-ACETAMINOPHEN 5-325 MG PO TABS: 1.0000 | ORAL_TABLET | ORAL | Status: DC | PRN

## 2015-03-15 MED ORDER — LIDOCAINE HCL (PF) 2 % IJ SOLN: 2.0000 mL | Freq: Once | INTRAMUSCULAR | Status: DC

## 2015-03-15 MED ORDER — DOXYCYCLINE HYCLATE 100 MG PO CAPS: 100.0000 mg | ORAL_CAPSULE | Freq: Two times a day (BID) | ORAL | Status: DC

## 2015-03-15 MED ORDER — DOXYCYCLINE HYCLATE 100 MG PO TABS
100.0000 mg | ORAL_TABLET | Freq: Once | ORAL | Status: AC
  Administered 2015-03-15: 100 mg via ORAL
  Filled 2015-03-15: qty 1

## 2015-03-15 NOTE — ED Notes (Signed)
Patient reports noticed some swelling and redness to right middle finger yesterday. States has worsened swelling and pain today.

## 2015-03-15 NOTE — Discharge Instructions (Signed)
Paronychia Paronychia is an inflammatory reaction involving the folds of the skin surrounding the fingernail. This is commonly caused by an infection in the skin around a nail. The most common cause of paronychia is frequent wetting of the hands (as seen with bartenders, food servers, nurses or others who wet their hands). This makes the skin around the fingernail susceptible to infection by bacteria (germs) or fungus. Other predisposing factors are:  Aggressive manicuring.  Nail biting.  Thumb sucking. The most common cause is a staphylococcal (a type of germ) infection, or a fungal (Candida) infection. When caused by a germ, it usually comes on suddenly with redness, swelling, pus and is often painful. It may get under the nail and form an abscess (collection of pus), or form an abscess around the nail. If the nail itself is infected with a fungus, the treatment is usually prolonged and may require oral medicine for up to one year. Your caregiver will determine the length of time treatment is required. The paronychia caused by bacteria (germs) may largely be avoided by not pulling on hangnails or picking at cuticles. When the infection occurs at the tips of the finger it is called felon. When the cause of paronychia is from the herpes simplex virus (HSV) it is called herpetic whitlow. TREATMENT  When an abscess is present treatment is often incision and drainage. This means that the abscess must be cut open so the pus can get out. When this is done, the following home care instructions should be followed. HOME CARE INSTRUCTIONS   It is important to keep the affected fingers very dry. Rubber or plastic gloves over cotton gloves should be used whenever the hand must be placed in water.  Keep wound clean, dry and dressed as suggested by your caregiver between warm soaks or warm compresses.  Soak in warm water for fifteen to twenty minutes three times per day for bacterial infections.   For  bacterial (germ) infections take antibiotics (medicine which kill germs) as directed and finish the prescription, even if the problem appears to be solved before the medicine is gone.  Only take over-the-counter or prescription medicines for pain, discomfort, or fever as directed by your caregiver. SEEK IMMEDIATE MEDICAL CARE IF:  You have redness, swelling, or increasing pain in the wound.  You notice pus coming from the wound.  You have a fever.  You notice a bad smell coming from the wound or dressing. Document Released: 04/27/2001 Document Revised: 01/24/2012 Document Reviewed: 12/27/2008 [facility] Patient Information 2015 [place], [place]. This information is not intended to replace advice given to you by your health care provider. Make sure you discuss any questions you have with your health care provider.

## 2015-03-16 NOTE — ED Provider Notes (Signed)
CSN: [id number]     Arrival date & time 03/15/15  1929 History   First MD Initiated Contact with Patient 03/15/15 2026     Chief Complaint  Patient presents with  . Finger Injury     (Consider location/radiation/quality/duration/timing/severity/associated sxs/prior Treatment) The history is provided by the patient and the spouse.   [name] [name] is a 74 y.o. right handed male presenting with pain and swelling at his right long finger tip along the nail edge he first noticed yesterday.  He denies injury to the site.  Pain is mild at rest, increased pain with palpation and movement.  There has been no drainage from the site and no radiation of pain. He has found no alleviators.     Past Medical History  Diagnosis Date  . Contrast dye induced nephropathy     Hx ARF secondary to contrast nephropathy  . Dyslipidemia   . Epididymitis   . Pneumonia     hx of   . Pleurisy     2012  . Arthritis     shoulders and ribs   . 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 Urothelial cancer     Dr. [name]  . History of blood transfusion   . Atrial fibrillation     atrial fib/ LOV [name] PA 04/11/12 EPIC, - CHEST X RAY, EKG 5/13 EPIC  . CAD (coronary artery disease)      s/p NSTEMI 04/08;  [place] 02/2007: Proximal LAD 75%, mid LAD 99%, proximal RCA 25%.  PCI:  Cypher DES to the proximal and mid LAD.  Last nuclear study 11/2011 (after a trip to the ED with CP): EF 58%, low risk study with small inferior wall infarct at the mid and basal level, no ischemia.  Last echo 01/2010: Mild LVH, EF 55-60%, mild AI, mild MR, severely dilated LA and RA  . Myocardial infarction 2008   Past Surgical History  Procedure Laterality Date  . Cystectomy  07/15/08  . Arm surgery      orif right elbow  . Ureter surgery    . Coronary stents     . Cystoscopy/retrograde/ureteroscopy  02/15/2012    Procedure: CYSTOSCOPY/RETROGRADE/URETEROSCOPY;  Surgeon: [name], MD;  Location: [facility];  Service: Urology;  Laterality: Right;   C-ARM  . Cystoscopy with ureteroscopy  03/23/2012    Procedure: CYSTOSCOPY WITH URETEROSCOPY;  Surgeon: [name], MD;  Location: [facility];  Service: Urology;  Laterality: Right;    **OR Room #8 requested**  C-ARM   . Cystoscopy with biopsy  03/23/2012    Procedure: CYSTOSCOPY WITH BIOPSY;  Surgeon: [name], MD;  Location: [facility];  Service: Urology;  Laterality: Right;   BRUSH BIOPSY RIGHT URETERAL STENT  . Cardiac catheterization  2008   Family History  Problem Relation Age of Onset  . Cancer Mother   . Coronary artery disease Father   . Heart attack Father   . Diabetes type II Mother   . Hyperlipidemia Mother    History  Substance Use Topics  . Smoking status: Former Smoker -- 2.00 packs/day for 10 years    Types: Cigarettes, Cigars    Quit date: 11/15/1981  . Smokeless tobacco: Never Used  . Alcohol Use: No    Review of Systems  Constitutional: Negative for fever.  Musculoskeletal: Positive for arthralgias. Negative for myalgias and joint swelling.  Skin: Positive for color change.  Neurological: Negative for weakness and numbness.      Allergies  Contrast media  Home Medications   Prior to Admission medications   Medication Sig Start Date End Date Taking? Authorizing Provider  aspirin 325 MG tablet Take 325 mg by mouth daily after breakfast.     Historical Provider, MD  atorvastatin (LIPITOR) 40 MG tablet Take 40 mg by mouth every evening.     Historical Provider, MD  calcitRIOL (ROCALTROL) 0.25 MCG capsule Take 0.25 mcg by mouth daily after breakfast.     Historical Provider, MD  doxycycline (VIBRAMYCIN) 100 MG capsule Take 1 capsule (100 mg total) by mouth 2 (two) times daily. 03/15/15   Evalee Jefferson, PA-C  furosemide (LASIX) 40 MG tablet TAKE 1 TABLET BY MOUTH EVERY DAY 11/08/13   Sherren Mocha, MD  HYDROcodone-acetaminophen (NORCO/VICODIN) 5-325 MG per tablet Take 1 tablet by mouth every 4 (four) hours as needed for moderate pain. 03/15/15   Evalee Jefferson, PA-C   Inositol Niacinate (NIACIN FLUSH FREE) 500 MG CAPS Take 1 capsule by mouth daily.     Historical Provider, MD  NITROSTAT 0.4 MG SL tablet DISSOLVE ONE TABLET UNDER THE TONGUE EVERY 5 MINUTES AS NEEDED FOR CHEST PAIN.  DO NOT EXCEED A TOTAL OF 3 DOSES IN 15 MINUTES 07/20/11   Sherren Mocha, MD  Omega-3 Fatty Acids (FISH OIL) 1200 MG CAPS Take 1 capsule by mouth 2 (two) times daily.    Historical Provider, MD  vitamin B-12 (CYANOCOBALAMIN) 500 MCG tablet Take 500 mcg by mouth daily.    Historical Provider, MD  zolpidem (AMBIEN) 5 MG tablet Take 5 mg by mouth at bedtime as needed for sleep.  05/16/14   Historical Provider, MD   BP 150/77 mmHg  Pulse 70  Temp(Src) 99 F (37.2 C) (Oral)  Resp 20  Ht 6' (1.829 m)  Wt 190 lb (86.183 kg)  BMI 25.76 kg/m2  SpO2 99% Physical Exam  Constitutional: He appears well-developed and well-nourished. No distress.  HENT:  Head: Normocephalic.  Neck: Neck supple.  Cardiovascular: Normal rate.   Pulmonary/Chest: Effort normal. He has no wheezes.  Musculoskeletal: Normal range of motion. He exhibits no edema.  Skin: There is erythema.  Erythema and edema at right 3rd finger cuticle edge.  Distal fingertip is uniformly edematous, volar finger tip is soft and nontender.  No drainage,  Less than 2 sec fingertip cap refill.    ED Course  Procedures (including critical care time)  INCISION AND DRAINAGE Performed by: Evalee Jefferson Consent: Verbal consent obtained. Risks and benefits: risks, benefits and alternatives were discussed Type: abscess  Body area: finger nail fold  Anesthesia: digital block Incision was made with a scalpel.  Local anesthetic: lidocaine 2% without epinephrine  Anesthetic total: 1 ml  Complexity: complex Blunt dissection to break up loculations, flushed with NS.  Drainage: purulent  Drainage amount: small  Packing material: none  Patient tolerance: Patient tolerated the procedure well with no immediate  complications.    Labs Review Labs Reviewed  CULTURE, ROUTINE-ABSCESS    Imaging Review No results found.   EKG Interpretation None      MDM   Final diagnoses:  Paronychia of finger, right    Encouraged warm epsom salt soaks tid.  Doxycycline. Hydrocodone.  F/u with pcp or return here for any worsening or persistent sx.  Dressing applied prior to dc home. Pt not utd with tetanus - this was given today.    Evalee Jefferson, PA-C 03/16/15 1416  Evalee Jefferson, PA-C 03/16/15 1416  Nat Christen, MD 03/16/15 1524

## 2015-03-18 LAB — CULTURE, ROUTINE-ABSCESS

## 2015-03-19 ENCOUNTER — Telehealth (HOSPITAL_BASED_OUTPATIENT_CLINIC_OR_DEPARTMENT_OTHER): Payer: Self-pay | Admitting: Emergency Medicine

## 2015-03-19 NOTE — Progress Notes (Signed)
ED Antimicrobial Stewardship Positive Culture Follow Up   [name] [name] is an 74 y.o. male who presented to [facility] on 03/15/2015 with a chief complaint of  Chief Complaint  Patient presents with  . Finger Injury    Recent Results (from the past 720 hour(s))  Culture, routine-abscess     Status: None   Collection Time: 03/15/15  9:31 PM  Result Value Ref Range Status   Specimen Description ABSCESS RIGHT FINGER PARONYCHIA  Final   Special Requests NONE  Final   Gram Stain   Final    RARE WBC PRESENT, PREDOMINANTLY PMN NO SQUAMOUS EPITHELIAL CELLS SEEN FEW GRAM NEGATIVE RODS RARE GRAM POSITIVE COCCI IN PAIRS    Culture   Final    FEW SERRATIA MARCESCENS Performed at [facility]    Report Status 03/18/2015 FINAL  Final   Organism ID, Bacteria SERRATIA MARCESCENS  Final      Susceptibility   Serratia marcescens - MIC*    CEFAZOLIN >=64 RESISTANT Resistant     CEFEPIME <=1 SENSITIVE Sensitive     CEFTAZIDIME <=1 SENSITIVE Sensitive     CEFTRIAXONE <=1 SENSITIVE Sensitive     CIPROFLOXACIN <=0.25 SENSITIVE Sensitive     GENTAMICIN <=1 SENSITIVE Sensitive     TOBRAMYCIN <=1 SENSITIVE Sensitive     TRIMETH/SULFA <=20 SENSITIVE Sensitive     * FEW SERRATIA MARCESCENS    [x]  Treated with doxycycline, organism resistant to prescribed antimicrobial []  Patient discharged originally without antimicrobial agent and treatment is now indicated  New antibiotic prescription: Bactrim DS 1 tablet BID x 7 days.  Stop doxycycline.  ED Provider: Al Corpus, PA-C   [name] [name], [name] [name] 03/19/2015, 8:45 AM Infectious Diseases Pharmacist Phone# [phone]

## 2015-05-12 ENCOUNTER — Other Ambulatory Visit: Payer: Self-pay

## 2015-07-28 DIAGNOSIS — Z23 Encounter for immunization: Secondary | ICD-10-CM | POA: Diagnosis not present

## 2015-07-28 DIAGNOSIS — Z0000 Encounter for general adult medical examination without abnormal findings: Secondary | ICD-10-CM | POA: Diagnosis not present

## 2015-07-30 DIAGNOSIS — H524 Presbyopia: Secondary | ICD-10-CM | POA: Diagnosis not present

## 2015-07-30 DIAGNOSIS — H5210 Myopia, unspecified eye: Secondary | ICD-10-CM | POA: Diagnosis not present

## 2015-08-15 DIAGNOSIS — C669 Malignant neoplasm of unspecified ureter: Secondary | ICD-10-CM | POA: Diagnosis not present

## 2015-08-22 DIAGNOSIS — D225 Melanocytic nevi of trunk: Secondary | ICD-10-CM | POA: Diagnosis not present

## 2015-08-22 DIAGNOSIS — L720 Epidermal cyst: Secondary | ICD-10-CM | POA: Diagnosis not present

## 2015-08-22 DIAGNOSIS — D1801 Hemangioma of skin and subcutaneous tissue: Secondary | ICD-10-CM | POA: Diagnosis not present

## 2015-08-22 DIAGNOSIS — L821 Other seborrheic keratosis: Secondary | ICD-10-CM | POA: Diagnosis not present

## 2015-08-22 DIAGNOSIS — L814 Other melanin hyperpigmentation: Secondary | ICD-10-CM | POA: Diagnosis not present

## 2015-08-26 DIAGNOSIS — D631 Anemia in chronic kidney disease: Secondary | ICD-10-CM | POA: Diagnosis not present

## 2015-08-26 DIAGNOSIS — N183 Chronic kidney disease, stage 3 (moderate): Secondary | ICD-10-CM | POA: Diagnosis not present

## 2015-08-26 DIAGNOSIS — I2510 Atherosclerotic heart disease of native coronary artery without angina pectoris: Secondary | ICD-10-CM | POA: Diagnosis not present

## 2015-08-26 DIAGNOSIS — N2581 Secondary hyperparathyroidism of renal origin: Secondary | ICD-10-CM | POA: Diagnosis not present

## 2015-09-02 DIAGNOSIS — L720 Epidermal cyst: Secondary | ICD-10-CM | POA: Diagnosis not present

## 2015-09-18 ENCOUNTER — Ambulatory Visit (INDEPENDENT_AMBULATORY_CARE_PROVIDER_SITE_OTHER): Payer: Commercial Managed Care - HMO | Admitting: Cardiology

## 2015-09-18 ENCOUNTER — Encounter: Payer: Self-pay | Admitting: Cardiology

## 2015-09-18 VITALS — BP 146/82 | HR 51 | Ht 72.0 in | Wt 189.1 lb

## 2015-09-18 DIAGNOSIS — R001 Bradycardia, unspecified: Secondary | ICD-10-CM | POA: Diagnosis not present

## 2015-09-18 DIAGNOSIS — I2510 Atherosclerotic heart disease of native coronary artery without angina pectoris: Secondary | ICD-10-CM | POA: Diagnosis not present

## 2015-09-18 DIAGNOSIS — E782 Mixed hyperlipidemia: Secondary | ICD-10-CM

## 2015-09-18 DIAGNOSIS — I4891 Unspecified atrial fibrillation: Secondary | ICD-10-CM

## 2015-09-18 MED ORDER — NITROSTAT 0.4 MG SL SUBL: 0.4000 mg | SUBLINGUAL_TABLET | SUBLINGUAL | Status: DC | PRN

## 2015-09-18 NOTE — Progress Notes (Signed)
Cardiology Office Note   Date:  09/18/2015   ID:  [name] [name], DOB Apr 16, 1941, MRN [id number]  PCP:  Antony Blackbird, MD  Cardiologist:  Dr. Burt Knack    Chief Complaint  Patient presents with  . Coronary Artery Disease      History of Present Illness: [name] [name] is a 74 y.o. male who presents for CAD and a fib.  He has hx of NSTEMI in 2008 with placement of DES in Prox and mid LAD.  normal LV function.  He has refused anticoagulation and is on ASA.  Hx low risk nuc 11/2011.   Today pt was not happy that he could not see Dr. Burt Knack,  Though he relaxed after giving his thoughts.  Pt denies any chest pain or SOB.  He has no weakness or fatigue.  He exercises, mostly with weights.  He is active.  He eats a healthy diet.   Most recent lipids followed by PCP T Chol 135, TG 71, HDL 46, LDL 75 Cr. 1.31 K+ 4.7.     Past Medical History  Diagnosis Date  . Contrast dye induced nephropathy     Hx ARF secondary to contrast nephropathy  . Dyslipidemia   . Epididymitis   . Pneumonia     hx of   . Pleurisy     2012  . Arthritis     shoulders and ribs   . 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 Urothelial cancer ([place])     Dr. [name]  . History of blood transfusion   . Atrial fibrillation ([place])     atrial fib/ LOV [name] PA 04/11/12 EPIC, - CHEST X RAY, EKG 5/13 EPIC  . CAD (coronary artery disease)      s/p NSTEMI 04/08;  [place] 02/2007: Proximal LAD 75%, mid LAD 99%, proximal RCA 25%.  PCI:  Cypher DES to the proximal and mid LAD.  Last nuclear study 11/2011 (after a trip to the ED with CP): EF 58%, low risk study with small inferior wall infarct at the mid and basal level, no ischemia.  Last echo 01/2010: Mild LVH, EF 55-60%, mild AI, mild MR, severely dilated LA and RA  . Myocardial infarction [facility]) 2008    Past Surgical History  Procedure Laterality Date  . Cystectomy  07/15/08  . Arm surgery      orif right elbow  . Ureter surgery    . Coronary stents     . Cystoscopy/retrograde/ureteroscopy   02/15/2012    Procedure: CYSTOSCOPY/RETROGRADE/URETEROSCOPY;  Surgeon: [name], MD;  Location: [facility];  Service: Urology;  Laterality: Right;  C-ARM  . Cystoscopy with ureteroscopy  03/23/2012    Procedure: CYSTOSCOPY WITH URETEROSCOPY;  Surgeon: [name], MD;  Location: [facility];  Service: Urology;  Laterality: Right;    **OR Room #8 requested**  C-ARM   . Cystoscopy with biopsy  03/23/2012    Procedure: CYSTOSCOPY WITH BIOPSY;  Surgeon: [name], MD;  Location: [facility];  Service: Urology;  Laterality: Right;   BRUSH BIOPSY RIGHT URETERAL STENT  . Cardiac catheterization  2008     Current Outpatient Prescriptions  Medication Sig Dispense Refill  . aspirin 325 MG tablet Take 325 mg by mouth daily after breakfast.     . atorvastatin (LIPITOR) 40 MG tablet Take 40 mg by mouth every evening.     . calcitRIOL (ROCALTROL) 0.25 MCG capsule Take 0.25 mcg by mouth daily after breakfast.     . furosemide (  LASIX) 40 MG tablet TAKE 1 TABLET BY MOUTH EVERY DAY 30 tablet 11  . Inositol Niacinate (NIACIN FLUSH FREE) 500 MG CAPS Take 1 capsule by mouth daily.     Marland Kitchen NITROSTAT 0.4 MG SL tablet Place 1 tablet (0.4 mg total) under the tongue every 5 (five) minutes as needed for chest pain. 25 tablet 4  . Omega-3 Fatty Acids (FISH OIL) 1200 MG CAPS Take 1 capsule by mouth daily.     . vitamin B-12 (CYANOCOBALAMIN) 500 MCG tablet Take 500 mcg by mouth daily.     No current facility-administered medications for this visit.   Facility-Administered Medications Ordered in Other Visits  Medication Dose Route Frequency Provider Last Rate Last Dose  . ceFAZolin (ANCEF) IVPB 1 g/50 mL premix  1 g Intravenous 30 min Pre-Op Lowella Bandy, MD        Allergies:   Contrast media    Social History:  The patient  reports that he quit smoking about 33 years ago. His smoking use included Cigarettes and Cigars. He has a 20 pack-year smoking history. He has never used smokeless tobacco. He reports that he does not  drink alcohol or use illicit drugs.   Family History:  The patient's family history includes Cancer in his mother; Coronary artery disease in his father; Diabetes type II in his mother; Heart attack in his father; Hyperlipidemia in his mother.    ROS:  General:no colds or fevers, + weight loss, pt has been trying to loose. Wit down 15 lbs. Skin:no rashes or ulcers HEENT:no blurred vision, no congestion CV:see HPI PUL:see HPI GI:no diarrhea constipation or melena, no indigestion GU:no hematuria, no dysuria MS:no joint pain, no claudication Neuro:no syncope, no lightheadedness Endo:no diabetes, no thyroid disease  Wt Readings from Last 3 Encounters:  09/18/15 189 lb 1.9 oz (85.784 kg)  03/15/15 190 lb (86.183 kg)  09/09/14 204 lb 6.4 oz (92.715 kg)     PHYSICAL EXAM: VS:  BP 146/82 mmHg  Pulse 51  Ht 6' (1.829 m)  Wt 189 lb 1.9 oz (85.784 kg)  BMI 25.64 kg/m2 , BMI Body mass index is 25.64 kg/(m^2). General:Pleasant affect, NAD Skin:Warm and dry, brisk capillary refill HEENT:normocephalic, sclera clear, mucus membranes moist Neck:supple, no JVD, no bruits  Heart:S1S2 RRR without murmur, gallup, rub or click Lungs:clear without rales, rhonchi, or wheezes ZOX:WRUE, non tender, + BS, do not palpate liver spleen or masses Ext:no lower ext edema, 2+ pedal pulses, 2+ radial pulses Neuro:alert and oriented, MAE, follows commands, + facial symmetry    EKG:  EKG is ordered today. The ekg ordered today demonstrates a fib with slow VR of 51- last year he was 48.  No acute changes.    Recent Labs: No results found for requested labs within last 365 days.    Lipid Panel    Component Value Date/Time   CHOL 109 01/27/2010 0937   TRIG 115.0 01/27/2010 0937   HDL 37.60* 01/27/2010 0937   CHOLHDL 3 01/27/2010 0937   VLDL 23.0 01/27/2010 0937   LDLCALC 48 01/27/2010 0937       Other studies Reviewed: Additional studies/ records that were reviewed today include: previous cath  and nuc study.  Labs per PCP   ASSESSMENT AND PLAN:  1.  CAD of native vessel and hx of DES to LAD.  No BB due to resting Bradycardia at 51 today. .  Follow up with Dr. Burt Knack in 1 year.  2. Permanent a fib, he has declined anticoagulation.  Discussed briefly today.  Continue ASA  3. Hyperlipidemia followed by PCP will call his office for a copy.  But LDL is 75.   Current medicines are reviewed with the patient today.  The patient Has no concerns regarding medicines.  The following changes have been made:  See above Labs/ tests ordered today include:see above  Disposition:   FU:  see above  Lennie Muckle, NP  09/18/2015 2:32 PM    [facility] [place], [place], [place] Phone: [phone]; Fax: [phone]

## 2015-09-18 NOTE — Patient Instructions (Signed)
Cecilie Kicks, NP, recommends that you schedule a follow-up appointment in 1 year. You will receive a reminder letter in the mail two months in advance. If you don't receive a letter, please call our office to schedule the follow-up appointment.  If you need a refill on your cardiac medications before your next appointment, please call your pharmacy.   NO BETA BLOCKERS because your heart rate is too slow.

## 2015-11-20 ENCOUNTER — Ambulatory Visit: Payer: Medicare HMO | Admitting: Cardiovascular Disease

## 2015-12-09 DIAGNOSIS — N183 Chronic kidney disease, stage 3 (moderate): Secondary | ICD-10-CM | POA: Diagnosis not present

## 2015-12-09 DIAGNOSIS — I2510 Atherosclerotic heart disease of native coronary artery without angina pectoris: Secondary | ICD-10-CM | POA: Diagnosis not present

## 2015-12-09 DIAGNOSIS — R972 Elevated prostate specific antigen [PSA]: Secondary | ICD-10-CM | POA: Diagnosis not present

## 2015-12-09 DIAGNOSIS — E785 Hyperlipidemia, unspecified: Secondary | ICD-10-CM | POA: Diagnosis not present

## 2015-12-09 DIAGNOSIS — R001 Bradycardia, unspecified: Secondary | ICD-10-CM | POA: Diagnosis not present

## 2015-12-09 DIAGNOSIS — I4891 Unspecified atrial fibrillation: Secondary | ICD-10-CM | POA: Diagnosis not present

## 2015-12-09 DIAGNOSIS — Z905 Acquired absence of kidney: Secondary | ICD-10-CM | POA: Diagnosis not present

## 2016-02-06 DIAGNOSIS — Z0000 Encounter for general adult medical examination without abnormal findings: Secondary | ICD-10-CM | POA: Diagnosis not present

## 2016-02-06 DIAGNOSIS — C669 Malignant neoplasm of unspecified ureter: Secondary | ICD-10-CM | POA: Diagnosis not present

## 2016-03-29 DIAGNOSIS — C669 Malignant neoplasm of unspecified ureter: Secondary | ICD-10-CM | POA: Diagnosis not present

## 2016-03-29 DIAGNOSIS — N183 Chronic kidney disease, stage 3 (moderate): Secondary | ICD-10-CM | POA: Diagnosis not present

## 2016-03-29 DIAGNOSIS — N2581 Secondary hyperparathyroidism of renal origin: Secondary | ICD-10-CM | POA: Diagnosis not present

## 2016-03-29 DIAGNOSIS — E782 Mixed hyperlipidemia: Secondary | ICD-10-CM | POA: Diagnosis not present

## 2016-03-29 DIAGNOSIS — D631 Anemia in chronic kidney disease: Secondary | ICD-10-CM | POA: Diagnosis not present

## 2016-03-29 DIAGNOSIS — Z0000 Encounter for general adult medical examination without abnormal findings: Secondary | ICD-10-CM | POA: Diagnosis not present

## 2016-03-29 DIAGNOSIS — I2510 Atherosclerotic heart disease of native coronary artery without angina pectoris: Secondary | ICD-10-CM | POA: Diagnosis not present

## 2016-07-05 DIAGNOSIS — S40871S Other superficial bite of right upper arm, sequela: Secondary | ICD-10-CM | POA: Diagnosis not present

## 2016-07-05 DIAGNOSIS — S60572A Other superficial bite of hand of left hand, initial encounter: Secondary | ICD-10-CM | POA: Diagnosis not present

## 2016-07-05 DIAGNOSIS — W57XXXA Bitten or stung by nonvenomous insect and other nonvenomous arthropods, initial encounter: Secondary | ICD-10-CM | POA: Diagnosis not present

## 2016-07-05 DIAGNOSIS — S40861A Insect bite (nonvenomous) of right upper arm, initial encounter: Secondary | ICD-10-CM | POA: Diagnosis not present

## 2016-07-27 ENCOUNTER — Encounter: Payer: Self-pay | Admitting: Cardiovascular Disease

## 2016-07-27 DIAGNOSIS — Z79899 Other long term (current) drug therapy: Secondary | ICD-10-CM | POA: Diagnosis not present

## 2016-07-27 DIAGNOSIS — Z23 Encounter for immunization: Secondary | ICD-10-CM | POA: Diagnosis not present

## 2016-07-27 DIAGNOSIS — N183 Chronic kidney disease, stage 3 (moderate): Secondary | ICD-10-CM | POA: Diagnosis not present

## 2016-07-27 DIAGNOSIS — I2510 Atherosclerotic heart disease of native coronary artery without angina pectoris: Secondary | ICD-10-CM | POA: Diagnosis not present

## 2016-07-27 DIAGNOSIS — R972 Elevated prostate specific antigen [PSA]: Secondary | ICD-10-CM | POA: Diagnosis not present

## 2016-07-27 DIAGNOSIS — Z0000 Encounter for general adult medical examination without abnormal findings: Secondary | ICD-10-CM | POA: Diagnosis not present

## 2016-07-27 DIAGNOSIS — Z1211 Encounter for screening for malignant neoplasm of colon: Secondary | ICD-10-CM | POA: Diagnosis not present

## 2016-08-10 DIAGNOSIS — D1801 Hemangioma of skin and subcutaneous tissue: Secondary | ICD-10-CM | POA: Diagnosis not present

## 2016-08-10 DIAGNOSIS — L821 Other seborrheic keratosis: Secondary | ICD-10-CM | POA: Diagnosis not present

## 2016-08-10 DIAGNOSIS — D2262 Melanocytic nevi of left upper limb, including shoulder: Secondary | ICD-10-CM | POA: Diagnosis not present

## 2016-08-10 DIAGNOSIS — L738 Other specified follicular disorders: Secondary | ICD-10-CM | POA: Diagnosis not present

## 2016-08-10 DIAGNOSIS — L812 Freckles: Secondary | ICD-10-CM | POA: Diagnosis not present

## 2016-08-10 DIAGNOSIS — D485 Neoplasm of uncertain behavior of skin: Secondary | ICD-10-CM | POA: Diagnosis not present

## 2016-08-10 DIAGNOSIS — D225 Melanocytic nevi of trunk: Secondary | ICD-10-CM | POA: Diagnosis not present

## 2016-08-17 ENCOUNTER — Telehealth: Payer: Self-pay | Admitting: Cardiovascular Disease

## 2016-08-17 NOTE — Telephone Encounter (Signed)
New message  Pt call requesting to speak with RN. Pt wants to know if his referral was received. Pt states because he has [employer] he needs to be sure that the referral for the appt that has be schedule was received. Please call back to discuss.

## 2016-08-18 NOTE — Telephone Encounter (Signed)
Hunt Oris in the billing department has spoken with the pt and referral has been obtained.

## 2016-08-30 DIAGNOSIS — R350 Frequency of micturition: Secondary | ICD-10-CM | POA: Diagnosis not present

## 2016-09-02 DIAGNOSIS — Z6826 Body mass index (BMI) 26.0-26.9, adult: Secondary | ICD-10-CM | POA: Diagnosis not present

## 2016-09-02 DIAGNOSIS — I2510 Atherosclerotic heart disease of native coronary artery without angina pectoris: Secondary | ICD-10-CM | POA: Diagnosis not present

## 2016-09-02 DIAGNOSIS — C669 Malignant neoplasm of unspecified ureter: Secondary | ICD-10-CM | POA: Diagnosis not present

## 2016-09-02 DIAGNOSIS — D631 Anemia in chronic kidney disease: Secondary | ICD-10-CM | POA: Diagnosis not present

## 2016-09-02 DIAGNOSIS — N183 Chronic kidney disease, stage 3 (moderate): Secondary | ICD-10-CM | POA: Diagnosis not present

## 2016-09-02 DIAGNOSIS — E782 Mixed hyperlipidemia: Secondary | ICD-10-CM | POA: Diagnosis not present

## 2016-09-02 DIAGNOSIS — N2581 Secondary hyperparathyroidism of renal origin: Secondary | ICD-10-CM | POA: Diagnosis not present

## 2016-09-07 ENCOUNTER — Other Ambulatory Visit: Payer: Self-pay | Admitting: Family Medicine

## 2016-09-07 ENCOUNTER — Ambulatory Visit
Admission: RE | Admit: 2016-09-07 | Discharge: 2016-09-07 | Disposition: A | Discharge status: Home / Self Care | Payer: Commercial Managed Care - HMO | Source: Ambulatory Visit | Attending: Family Medicine | Admitting: Family Medicine

## 2016-09-07 DIAGNOSIS — R52 Pain, unspecified: Secondary | ICD-10-CM

## 2016-09-07 DIAGNOSIS — M533 Sacrococcygeal disorders, not elsewhere classified: Secondary | ICD-10-CM | POA: Diagnosis not present

## 2016-09-07 DIAGNOSIS — M545 Low back pain: Secondary | ICD-10-CM | POA: Diagnosis not present

## 2016-09-07 DIAGNOSIS — M47816 Spondylosis without myelopathy or radiculopathy, lumbar region: Secondary | ICD-10-CM | POA: Diagnosis not present

## 2016-09-10 DIAGNOSIS — R935 Abnormal findings on diagnostic imaging of other abdominal regions, including retroperitoneum: Secondary | ICD-10-CM | POA: Diagnosis not present

## 2016-09-10 DIAGNOSIS — C669 Malignant neoplasm of unspecified ureter: Secondary | ICD-10-CM | POA: Diagnosis not present

## 2016-09-14 ENCOUNTER — Encounter: Payer: Self-pay | Admitting: Cardiovascular Disease

## 2016-09-17 DIAGNOSIS — C661 Malignant neoplasm of right ureter: Secondary | ICD-10-CM | POA: Diagnosis not present

## 2016-09-29 ENCOUNTER — Encounter: Payer: Self-pay | Admitting: Cardiovascular Disease

## 2016-09-29 ENCOUNTER — Ambulatory Visit (INDEPENDENT_AMBULATORY_CARE_PROVIDER_SITE_OTHER): Payer: Commercial Managed Care - HMO | Admitting: Cardiovascular Disease

## 2016-09-29 VITALS — BP 140/70 | HR 60 | Ht 72.0 in | Wt 190.4 lb

## 2016-09-29 DIAGNOSIS — I2510 Atherosclerotic heart disease of native coronary artery without angina pectoris: Secondary | ICD-10-CM | POA: Diagnosis not present

## 2016-09-29 DIAGNOSIS — E782 Mixed hyperlipidemia: Secondary | ICD-10-CM | POA: Diagnosis not present

## 2016-09-29 NOTE — Patient Instructions (Signed)
Medication Instructions:  Your physician recommends that you continue on your current medications as directed. Please refer to the Current Medication list given to you today.  Please check on the price of Eliquis 5mg  take one tablet twice a day and Xarelto 15mg  one tablet with evening meal  Labwork: No new orders.   Testing/Procedures: No new orders.   Follow-Up: Your physician wants you to follow-up in: 1 YEAR with Dr Burt Knack.  You will receive a reminder letter in the mail two months in advance. If you don't receive a letter, please call our office to schedule the follow-up appointment.   Any Other Special Instructions Will Be Listed Below (If Applicable).  Dr Garret Reddish [phone]   If you need a refill on your cardiac medications before your next appointment, please call your pharmacy.

## 2016-09-29 NOTE — Progress Notes (Signed)
Cardiology Office Note Date:  10/01/2016   ID:  [name] [name], DOB [birth year], MRN [id number]  PCP:  Cammy Copa, MD  Cardiologist:  Sherren Mocha, MD    Chief Complaint  Patient presents with  . Coronary Artery Disease     History of Present Illness: [name] [name] is a 75 y.o. male who presents for follow-up evaluation. The patient is here alone today. He has been followed for coronary artery disease with initial presentation in 2008 when he had a non-ST elevation infarct. He is treated with drug-eluting stents in the proximal and mid LAD. His LV function is been normal. He's had no problems with angina over the years. He has not required repeat heart catheterization. The patient also has developed permanent atrial fibrillation. He has declined long-term anticoagulation and remains on aspirin 325 mg daily.  He continues to exercise regularly at the gym. He's had some limitations from back problems. He denies chest pain, shortness of breath, heart palpitations, or lightheadedness. Overall he feels well. He brings in lab work today from his primary physician which demonstrates a creatinine of 1.37 (EGFR 50), cholesterol 139, triglycerides 65, HDL 45, LDL 82.   Past Medical History:  Diagnosis Date  . Arthritis    shoulders and ribs   . Atrial fibrillation ([place])    atrial fib/ LOV [name] PA 04/11/12 EPIC, - CHEST X RAY, EKG 5/13 EPIC  . CAD (coronary artery disease)     s/p NSTEMI 04/08;  [place] 02/2007: Proximal LAD 75%, mid LAD 99%, proximal RCA 25%.  PCI:  Cypher DES to the proximal and mid LAD.  Last nuclear study 11/2011 (after a trip to the ED with CP): EF 58%, low risk study with small inferior wall infarct at the mid and basal level, no ischemia.  Last echo 01/2010: Mild LVH, EF 55-60%, mild AI, mild MR, severely dilated LA and RA  . Contrast dye induced nephropathy    Hx ARF secondary to contrast nephropathy  . Dyslipidemia   . Epididymitis   . History of blood transfusion    . Myocardial infarction 2008  . Pleurisy    2012  . Pneumonia    hx of   . 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 [name]    Past Surgical History:  Procedure Laterality Date  . arm surgery     orif right elbow  . CARDIAC CATHETERIZATION  2008  . coronary stents     . CYSTECTOMY  07/15/08  . CYSTOSCOPY WITH BIOPSY  03/23/2012   Procedure: CYSTOSCOPY WITH BIOPSY;  Surgeon: [name], MD;  [facility];  Service: Urology;  Laterality: Right;   BRUSH BIOPSY RIGHT URETERAL STENT  . CYSTOSCOPY WITH URETEROSCOPY  03/23/2012   Procedure: CYSTOSCOPY WITH URETEROSCOPY;  Surgeon: [name], MD;  [facility];  Service: Urology;  Laterality: Right;    **OR Room #8 requested**  C-ARM   . CYSTOSCOPY/RETROGRADE/URETEROSCOPY  02/15/2012   Procedure: CYSTOSCOPY/RETROGRADE/URETEROSCOPY;  Surgeon: [name], MD;  [facility]: [facility];  Service: Urology;  Laterality: Right;  C-ARM  . URETER SURGERY      Current Outpatient Prescriptions  Medication Sig Dispense Refill  . aspirin 325 MG tablet Take 325 mg by mouth daily after breakfast.     . atorvastatin (LIPITOR) 40 MG tablet Take 40 mg by mouth every evening.     . calcitRIOL (ROCALTROL) 0.25 MCG capsule Take 0.25 mcg by mouth daily after  breakfast.     . furosemide (LASIX) 40 MG tablet TAKE 1 TABLET BY MOUTH EVERY DAY 30 tablet 11  . Inositol Niacinate (NIACIN FLUSH FREE) 500 MG CAPS Take 1 capsule by mouth daily.     Marland Kitchen NITROSTAT 0.4 MG SL tablet Place 1 tablet (0.4 mg total) under the tongue every 5 (five) minutes as needed for chest pain. 25 tablet 4  . Omega-3 Fatty Acids (FISH OIL) 1200 MG CAPS Take 1 capsule by mouth daily.     . vitamin B-12 (CYANOCOBALAMIN) 500 MCG tablet Take 500 mcg by mouth daily.     No current facility-administered medications for this visit.    Facility-Administered Medications Ordered in Other Visits  Medication Dose Route Frequency Provider Last Rate Last Dose  . ceFAZolin  (ANCEF) IVPB 1 g/50 mL premix  1 g Intravenous 30 min Pre-Op Lowella Bandy, MD        Allergies:   Contrast media [iodinated diagnostic agents]; Iodine-131; and Other   Social History:  The patient  reports that he quit smoking about 34 years ago. His smoking use included Cigarettes and Cigars. He has a 20.00 pack-year smoking history. He has never used smokeless tobacco. He reports that he does not drink alcohol or use drugs.   Family History:  The patient's  family history includes Cancer in his mother; Coronary artery disease in his father; Diabetes type II in his mother; Heart attack in his father; Hyperlipidemia in his mother.    ROS:  Please see the history of present illness.  Otherwise, review of systems is positive for lower back pain.  All other systems are reviewed and negative.    PHYSICAL EXAM: VS:  BP 140/70   Pulse 60   Ht 6' (1.829 m)   Wt 190 lb 6.4 oz (86.4 kg)   BMI 25.82 kg/m  , BMI Body mass index is 25.82 kg/m. GEN: Well nourished, well developed, in no acute distress  HEENT: normal  Neck: no JVD, no masses. No carotid bruits Cardiac: irregularly irregular without murmur or gallop                Respiratory:  clear to auscultation bilaterally, normal work of breathing GI: soft, nontender, nondistended, + BS MS: no deformity or atrophy  Ext: no pretibial edema, pedal pulses 2+= bilaterally Skin: warm and dry, no rash Neuro:  Strength and sensation are intact Psych: euthymic mood, full affect  EKG:  EKG is ordered today. The ekg ordered today shows atrial fibrillation with slow ventricular response, heart rate 49 bpm.  Recent Labs: No results found for requested labs within last 8760 hours.   Lipid Panel     Component Value Date/Time   CHOL 109 01/27/2010 0937   TRIG 115.0 01/27/2010 0937   HDL 37.60 (L) 01/27/2010 0937   CHOLHDL 3 01/27/2010 0937   VLDL 23.0 01/27/2010 0937   LDLCALC 48 01/27/2010 0937      Wt Readings from Last 3 Encounters:    09/29/16 190 lb 6.4 oz (86.4 kg)  09/18/15 189 lb 1.9 oz (85.8 kg)  03/15/15 190 lb (86.2 kg)    ASSESSMENT AND PLAN: 1.  Permanent atrial fibrillation with slow ventricular response: The patient remains asymptomatic. He is tolerating aspirin 325 mg daily. He understands the recommendation for chronic oral anticoagulation. We have discussed this in the past and he has not been interested. He is willing to consider use of a direct oral anticoagulant drug. He would like to evaluate cost.  Would consider apixaban 5 mg twice a day or rivaroxaban 15 mg daily based on his reduced creatinine clearance. He will let us know if he decides to initiate anticoagulation at which time we would discontinue aspirin.  This patients CHA2DS2-VASc Score and unadjusted Ischemic Stroke Rate (% per year) is equal to 3.2 % stroke rate/year from a score of 3  Above score calculated as 1 point each if present [CHF, HTN, DM, Vascular=MI/PAD/Aortic Plaque, Age if 65-74, or Male] Above score calculated as 2 points each if present [Age > 75, or Stroke/TIA/TE]  2. Coronary artery disease, native vessel: No symptoms of angina. Continue current management. On account for her beta blocker because of bradycardia.  3. Hyperlipidemia: Lipids outlined as above. Patient is tolerating the use of a statin drug with atorvastatin 40 mg daily.   Current medicines are reviewed with the patient today.  The patient does not have concerns regarding medicines.  Labs/ tests ordered today include:   Orders Placed This Encounter  Procedures  . EKG 12-Lead    Disposition:   FU one year  Signed, Sherren Mocha, MD  10/01/2016 6:02 AM    [facility] [place], [place], [place]  [address] Phone: [phone]; Fax: [phone]

## 2016-09-30 DIAGNOSIS — Z86010 Personal history of colonic polyps: Secondary | ICD-10-CM | POA: Diagnosis not present

## 2016-09-30 DIAGNOSIS — I4891 Unspecified atrial fibrillation: Secondary | ICD-10-CM | POA: Diagnosis not present

## 2016-10-01 ENCOUNTER — Telehealth: Payer: Self-pay | Admitting: Cardiovascular Disease

## 2016-10-01 MED ORDER — RIVAROXABAN 15 MG PO TABS: 15.0000 mg | ORAL_TABLET | Freq: Every day | ORAL | 3 refills | Status: DC

## 2016-10-01 NOTE — Telephone Encounter (Signed)
Patient has decided to begin taking a medication that Dr. Burt Knack wanted him to take. Not sure of the name.  Please call patient regarding the med

## 2016-10-01 NOTE — Telephone Encounter (Signed)
I spoke with the pt's wife and she said the pt would like to start Xarelto 15mg  daily.  She requested that this Rx be sent to [facility] order pharmacy.  Rx sent.  I did advise when the pt starts this medication he should STOP ASPIRIN. The pt's wife verbalized understanding of instructions.

## 2016-10-04 ENCOUNTER — Other Ambulatory Visit: Payer: Self-pay | Admitting: Family Medicine

## 2016-10-04 DIAGNOSIS — M5136 Other intervertebral disc degeneration, lumbar region: Secondary | ICD-10-CM

## 2016-10-16 ENCOUNTER — Ambulatory Visit
Admission: RE | Admit: 2016-10-16 | Discharge: 2016-10-16 | Disposition: A | Discharge status: Home / Self Care | Payer: Commercial Managed Care - HMO | Source: Ambulatory Visit | Attending: Family Medicine | Admitting: Family Medicine

## 2016-10-16 DIAGNOSIS — M5126 Other intervertebral disc displacement, lumbar region: Secondary | ICD-10-CM | POA: Diagnosis not present

## 2016-10-16 DIAGNOSIS — M5136 Other intervertebral disc degeneration, lumbar region: Secondary | ICD-10-CM

## 2016-10-19 DIAGNOSIS — M4726 Other spondylosis with radiculopathy, lumbar region: Secondary | ICD-10-CM | POA: Diagnosis not present

## 2016-10-19 DIAGNOSIS — M5136 Other intervertebral disc degeneration, lumbar region: Secondary | ICD-10-CM | POA: Diagnosis not present

## 2016-11-04 DIAGNOSIS — D120 Benign neoplasm of cecum: Secondary | ICD-10-CM | POA: Diagnosis not present

## 2016-11-04 DIAGNOSIS — Z86010 Personal history of colonic polyps: Secondary | ICD-10-CM | POA: Diagnosis not present

## 2016-11-04 DIAGNOSIS — D126 Benign neoplasm of colon, unspecified: Secondary | ICD-10-CM | POA: Diagnosis not present

## 2016-11-11 ENCOUNTER — Telehealth: Payer: Self-pay | Admitting: Cardiovascular Disease

## 2016-11-11 DIAGNOSIS — D126 Benign neoplasm of colon, unspecified: Secondary | ICD-10-CM | POA: Diagnosis not present

## 2016-11-11 NOTE — Telephone Encounter (Signed)
New Message  Pt c/o medication issue:  1. Name of Medication: Rivaroxaban (Xarelto) 15 mg tabs with supper  2. How are you currently taking this medication (dosage and times per day)? See above  3. Are you having a reaction (difficulty breathing--STAT)? N/A  4. What is your medication issue? Pt voiced needing to know if it's okay to take with aspirin and with him having stage 3 kidney disease.  Please f/u with pt

## 2016-11-11 NOTE — Telephone Encounter (Signed)
Spoke with pt and advised him that per previous phone note he was to stop ASA when he started Xarelto.  Pt wanted to know if current dose of Xarelto was ok due to his kidney issues.  Spoke with pharmacist, Pole Ojea, to clarify and she said this is correct dose.  Advised pt of this information.  Pt appreciative for assistance.

## 2016-12-08 ENCOUNTER — Ambulatory Visit (INDEPENDENT_AMBULATORY_CARE_PROVIDER_SITE_OTHER): Payer: Medicare HMO | Admitting: Family Medicine

## 2016-12-08 ENCOUNTER — Encounter: Payer: Self-pay | Admitting: Family Medicine

## 2016-12-08 VITALS — BP 132/64 | HR 54 | Temp 98.8°F | Resp 16 | Ht 72.0 in | Wt 194.4 lb

## 2016-12-08 DIAGNOSIS — E782 Mixed hyperlipidemia: Secondary | ICD-10-CM

## 2016-12-08 DIAGNOSIS — N183 Chronic kidney disease, stage 3 unspecified: Secondary | ICD-10-CM

## 2016-12-08 DIAGNOSIS — I482 Chronic atrial fibrillation, unspecified: Secondary | ICD-10-CM

## 2016-12-08 DIAGNOSIS — M48062 Spinal stenosis, lumbar region with neurogenic claudication: Secondary | ICD-10-CM | POA: Insufficient documentation

## 2016-12-08 DIAGNOSIS — I2510 Atherosclerotic heart disease of native coronary artery without angina pectoris: Secondary | ICD-10-CM

## 2016-12-08 DIAGNOSIS — M48061 Spinal stenosis, lumbar region without neurogenic claudication: Secondary | ICD-10-CM

## 2016-12-08 NOTE — Assessment & Plan Note (Signed)
On xarelto, rate controlled with naturally low HR

## 2016-12-08 NOTE — Patient Instructions (Addendum)
Try the Bausch and [employer] brand for Dry EYE  F/U Sept for physical

## 2016-12-08 NOTE — Assessment & Plan Note (Signed)
Symptoms controlled, will monitor for now

## 2016-12-08 NOTE — Progress Notes (Signed)
   Subjective:    Patient ID: [name] [name], male    DOB: 09/08/1941, 76 y.o.   MRN: [id number]  Patient presents for [facility] (is not fasting)  Patient here to establish care. [facility] primary care provider Dr. Antony Blackbird at [facility] Cardiology- Dr. Burt Knack - History  of coronary artery disease along with atrial fibrillation. He has history of myocardial infarction in 2008 he has drug-eluting stents he is currently on Xarelto. He also takes Lipitor and fish oil and niacin he does have Lasix when necessary. 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 [name]   CKD- followed by  1.38 in Sept 2017 GFR of  50  , [facility] - Dr. 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 Ronnald Ramp   Immunizations- Flu UTD , shingles and prevnar 41 UTD  GI- Dr. Paulita Fujita - [facility] Dec 2017. Had benign polyp and few diverituclar spots     Review Of Systems:  GEN- denies fatigue, fever, weight loss,weakness, recent illness HEENT- denies eye drainage, change in vision, nasal discharge, CVS- denies chest pain, palpitations RESP- denies SOB, cough, wheeze ABD- denies N/V, change in stools, abd pain GU- denies dysuria, hematuria, dribbling, incontinence MSK- + joint pain, muscle aches, injury Neuro- denies headache, dizziness, syncope, seizure activity       Objective:    BP 132/64 (BP Location: Left Arm, Patient Position: Sitting, Cuff Size: Large)   Pulse (!) 54   Temp 98.8 F (37.1 C) (Oral)   Resp 16   Ht 6' (1.829 m)   Wt 194 lb 6.4 oz (88.2 kg)    SpO2 98%   BMI 26.37 kg/m  GEN- NAD, alert and oriented x3 HEENT- PERRL, EOMI, non injected sclera, pink conjunctiva, MMM, oropharynx clear Neck- Supple, no thyromegaly CVS- irregular rhythem, no murmur RESP-CTAB ABD-NABS,soft,NT,ND EXT- No edema Pulses- Radial 2+        Assessment & Plan:      Problem List Items Addressed This Visit    Spinal stenosis of lumbar region    Symptoms controlled, will monitor for now      Mixed hyperlipidemia - Primary   CKD (chronic kidney disease), stage III    Solitary left renal function, follows with Nephrology- Dr. Jimmy Footman      CAD, NATIVE VESSEL    Lipids at goal, continue f/u cardiology      Atrial fibrillation ([place])    On xarelto, rate controlled with naturally low HR         Note: This dictation was prepared with Dragon dictation along with smaller phrase technology. Any transcriptional errors that result from this process are unintentional.

## 2016-12-08 NOTE — Assessment & Plan Note (Signed)
Solitary left renal function, follows with Nephrology- Dr. Jimmy Footman

## 2016-12-08 NOTE — Assessment & Plan Note (Signed)
Lipids at goal, continue f/u cardiology

## 2016-12-31 ENCOUNTER — Other Ambulatory Visit: Payer: Self-pay | Admitting: *Deleted

## 2016-12-31 MED ORDER — CALCITRIOL 0.25 MCG PO CAPS: 0.2500 ug | ORAL_CAPSULE | Freq: Every day | ORAL | 3 refills | Status: DC

## 2016-12-31 MED ORDER — ATORVASTATIN CALCIUM 40 MG PO TABS
40.0000 mg | ORAL_TABLET | Freq: Every evening | ORAL | 3 refills | Status: DC
Start: 2016-12-31 — End: 2017-12-01

## 2016-12-31 MED ORDER — FUROSEMIDE 40 MG PO TABS: 40.0000 mg | ORAL_TABLET | Freq: Every day | ORAL | 3 refills | Status: DC

## 2017-01-14 ENCOUNTER — Encounter (HOSPITAL_COMMUNITY): Payer: Self-pay | Admitting: Emergency Medicine

## 2017-01-14 ENCOUNTER — Emergency Department (HOSPITAL_COMMUNITY)
Admission: EM | Admit: 2017-01-14 | Discharge: 2017-01-14 | Disposition: A | Discharge status: Home / Self Care | Payer: Commercial Managed Care - HMO | Attending: Emergency Medicine | Admitting: Emergency Medicine

## 2017-01-14 ENCOUNTER — Emergency Department (HOSPITAL_COMMUNITY): Payer: Commercial Managed Care - HMO

## 2017-01-14 DIAGNOSIS — Z8551 Personal history of malignant neoplasm of bladder: Secondary | ICD-10-CM | POA: Insufficient documentation

## 2017-01-14 DIAGNOSIS — Z87891 Personal history of nicotine dependence: Secondary | ICD-10-CM | POA: Diagnosis not present

## 2017-01-14 DIAGNOSIS — M25462 Effusion, left knee: Secondary | ICD-10-CM | POA: Diagnosis not present

## 2017-01-14 DIAGNOSIS — I2510 Atherosclerotic heart disease of native coronary artery without angina pectoris: Secondary | ICD-10-CM | POA: Diagnosis not present

## 2017-01-14 DIAGNOSIS — Y9343 Activity, gymnastics: Secondary | ICD-10-CM | POA: Insufficient documentation

## 2017-01-14 DIAGNOSIS — S8992XA Unspecified injury of left lower leg, initial encounter: Secondary | ICD-10-CM | POA: Diagnosis present

## 2017-01-14 DIAGNOSIS — Y999 Unspecified external cause status: Secondary | ICD-10-CM | POA: Insufficient documentation

## 2017-01-14 DIAGNOSIS — Z79899 Other long term (current) drug therapy: Secondary | ICD-10-CM | POA: Diagnosis not present

## 2017-01-14 DIAGNOSIS — W01190A Fall on same level from slipping, tripping and stumbling with subsequent striking against furniture, initial encounter: Secondary | ICD-10-CM | POA: Diagnosis not present

## 2017-01-14 DIAGNOSIS — I252 Old myocardial infarction: Secondary | ICD-10-CM | POA: Diagnosis not present

## 2017-01-14 DIAGNOSIS — Y9239 Other specified sports and athletic area as the place of occurrence of the external cause: Secondary | ICD-10-CM | POA: Diagnosis not present

## 2017-01-14 DIAGNOSIS — N183 Chronic kidney disease, stage 3 (moderate): Secondary | ICD-10-CM | POA: Diagnosis not present

## 2017-01-14 MED ORDER — HYDROCODONE-ACETAMINOPHEN 5-325 MG PO TABS: 1.0000 | ORAL_TABLET | ORAL | 0 refills | Status: DC | PRN

## 2017-01-14 MED ORDER — HYDROCODONE-ACETAMINOPHEN 5-325 MG PO TABS
1.0000 | ORAL_TABLET | Freq: Once | ORAL | Status: AC
  Administered 2017-01-14: 1 via ORAL
  Filled 2017-01-14: qty 1

## 2017-01-14 NOTE — Discharge Instructions (Signed)
Use ice as discussed for the next 3 days.  5 minutes with an ice pack every hour while awake would be a good schedule. You may add heat 20 minutes 3 times daily starting on Tuesday. Use your cane or your walker to help minimize weight bearing. You may take the hydrocodone prescribed for pain relief.  This will make you drowsy - do not drive within 4 hours of taking this medication.

## 2017-01-14 NOTE — ED Triage Notes (Signed)
PT states he was at the [facility] and tripped over a bench and landed onto his left knee. PT states swelling and pain to left knee since fall but has been able to ambulate with a cane.

## 2017-01-14 NOTE — ED Provider Notes (Signed)
[facility] DEPT Provider Note   CSN: [id number] Arrival date & time: 01/14/17  1443     History   Chief Complaint Chief Complaint  Patient presents with  . Knee Injury    HPI [name] [name] is a 76 y.o. male presenting with left knee sharp pain and swelling since tripping and falling at the gym this morning.  He describes falling forward landing on the flexed knee as he was stepping over a weight bench.  He has been weight bear using his cane but with discomfort.  He has found no alleviators, denies knee instability, no treatment prior to arrival.  Denies ankle, hip or other injury. Pain is worsened with weight bearing, better at rest.   The history is provided by the patient and the spouse.    Past Medical History:  Diagnosis Date  . Arthritis    shoulders and ribs   . Atrial fibrillation ([place])    atrial fib/ LOV [name] PA 04/11/12 EPIC, - CHEST X RAY, EKG 5/13 EPIC  . CAD (coronary artery disease)     s/p NSTEMI 04/08;  [place] 02/2007: Proximal LAD 75%, mid LAD 99%, proximal RCA 25%.  PCI:  Cypher DES to the proximal and mid LAD.  Last nuclear study 11/2011 (after a trip to the ED with CP): EF 58%, low risk study with small inferior wall infarct at the mid and basal level, no ischemia.  Last echo 01/2010: Mild LVH, EF 55-60%, mild AI, mild MR, severely dilated LA and RA  . Contrast dye induced nephropathy    Hx ARF secondary to contrast nephropathy  . Dyslipidemia   . Epididymitis   . History of blood transfusion   . Myocardial infarction 2008  . Pleurisy    2012  . Pneumonia    hx of   . 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 Spinal stenosis   . Urothelial cancer ([place])    Dr. [name]    Patient Active Problem List   Diagnosis Date Noted  . Spinal stenosis of lumbar region 12/08/2016  . Reflux 11/26/2011  . Mixed hyperlipidemia 10/05/2008  . CAD, NATIVE VESSEL 10/05/2008  . Atrial fibrillation ([place]) 10/05/2008  . CKD (chronic kidney disease), stage III 10/05/2008    Past  Surgical History:  Procedure Laterality Date  . arm surgery     orif right elbow  . CARDIAC CATHETERIZATION  2008  . coronary stents     . CYSTECTOMY  07/15/08  . CYSTOSCOPY WITH BIOPSY  03/23/2012   Procedure: CYSTOSCOPY WITH BIOPSY;  Surgeon: [name], MD;  Location: [facility];  Service: Urology;  Laterality: Right;   BRUSH BIOPSY RIGHT URETERAL STENT  . CYSTOSCOPY WITH URETEROSCOPY  03/23/2012   Procedure: CYSTOSCOPY WITH URETEROSCOPY;  Surgeon: [name], MD;  Location: [facility];  Service: Urology;  Laterality: Right;    **OR Room #8 requested**  C-ARM   . CYSTOSCOPY/RETROGRADE/URETEROSCOPY  02/15/2012   Procedure: CYSTOSCOPY/RETROGRADE/URETEROSCOPY;  Surgeon: [name], MD;  Location: [facility];  Service: Urology;  Laterality: Right;  C-ARM  . URETER SURGERY         Home Medications    Prior to Admission medications   Medication Sig Start Date End Date Taking? Authorizing Provider  atorvastatin (LIPITOR) 40 MG tablet Take 1 tablet (40 mg total) by mouth every evening. 12/31/16   Alycia Rossetti, MD  calcitRIOL (ROCALTROL) 0.25 MCG capsule Take 1 capsule (0.25 mcg total) by mouth daily after breakfast. 12/31/16  Alycia Rossetti, MD  furosemide (LASIX) 40 MG tablet Take 1 tablet (40 mg total) by mouth daily. 12/31/16   Alycia Rossetti, MD  HYDROcodone-acetaminophen (NORCO/VICODIN) 5-325 MG tablet Take 1 tablet by mouth every 4 (four) hours as needed. 01/14/17   Evalee Jefferson, PA-C  Inositol Niacinate (NIACIN FLUSH FREE) 500 MG CAPS Take 1 capsule by mouth daily.     Historical Provider, MD  NITROSTAT 0.4 MG SL tablet Place 1 tablet (0.4 mg total) under the tongue every 5 (five) minutes as needed for chest pain. 09/18/15   Isaiah Serge, NP  Omega-3 Fatty Acids (FISH OIL) 1200 MG CAPS Take 1 capsule by mouth daily.     Historical Provider, MD  Rivaroxaban (XARELTO) 15 MG TABS tablet Take 1 tablet (15 mg total) by mouth daily with supper. 10/01/16   Sherren Mocha, MD  vitamin B-12  (CYANOCOBALAMIN) 500 MCG tablet Take 500 mcg by mouth daily.    Historical Provider, MD    Family History Family History  Problem Relation Age of Onset  . Cancer Mother   . Diabetes type II Mother   . Hyperlipidemia Mother   . Coronary artery disease Father   . Heart attack Father     Social History Social History  Substance Use Topics  . Smoking status: Former Smoker    Packs/day: 2.00    Years: 10.00    Types: Cigarettes, Cigars    Quit date: 11/15/1981  . Smokeless tobacco: Never Used  . Alcohol use No     Allergies   Contrast media [iodinated diagnostic agents]; Iodine-131; and Other   Review of Systems Review of Systems  Constitutional: Negative for fever.  Musculoskeletal: Positive for arthralgias and joint swelling. Negative for myalgias.  Neurological: Negative for weakness and numbness.     Physical Exam Updated Vital Signs BP 159/74 (BP Location: Right Arm)   Pulse 69   Temp 99 F (37.2 C) (Oral)   Resp 18   Ht 6' (1.829 m)   Wt 84.8 kg   SpO2 100%   BMI 25.36 kg/m   Physical Exam  Constitutional: He appears well-developed and well-nourished.  HENT:  Head: Atraumatic.  Neck: Normal range of motion.  Cardiovascular:  Pulses equal bilaterally  Musculoskeletal: He exhibits tenderness.       Left knee: He exhibits effusion. He exhibits no ecchymosis, no deformity, normal alignment, no LCL laxity, normal meniscus and no MCL laxity.  ttp posterior knee.  Small effusion appreciated. No ligament instability.  Pt can SLR with full strength knee.    Neurological: He is alert. He has normal strength. He displays normal reflexes. No sensory deficit.  Skin: Skin is warm and dry.  Psychiatric: He has a normal mood and affect.     ED Treatments / Results  Labs (all labs ordered are listed, but only abnormal results are displayed) Labs Reviewed - No data to display  EKG  EKG Interpretation None       Radiology Dg Knee Complete 4 Views  Left  Result Date: 01/14/2017 CLINICAL DATA:  Fall stepping over a bands this morning. Pain and swelling in the left knee especially posteriorly. EXAM: LEFT KNEE - COMPLETE 4+ VIEW COMPARISON:  None. FINDINGS: Tricompartmental osteoarthritis with marginal spurring and moderate articular space narrowing in the medial compartment. Small but abnormal knee effusion. Somewhat widened articular space in the lateral compartment measuring 12 mm. Prepatellar subcutaneous edema. Distal SFA atherosclerotic calcification. Spurring posteriorly in the tibial plateau and along the  tibial spine. No well-defined fracture. Slight irregularity along the posterior lip of the medial tibial plateau is probably from spurring. IMPRESSION: 1. Osteoarthritis with tricompartmental spurring and medial compartmental articular space narrowing. 2. Small but abnormal knee effusion. 3. Considerable spurring of the tibial spine and along the posterior rim of the medial tibial plateau. 4. I do not see a definite fracture. 5. Atherosclerosis. 6. If pain persists despite conservative therapy, consider MRI. Electronically Signed   By: Van Clines M.D.   On: 01/14/2017 16:16    Procedures Procedures (including critical care time)  Medications Ordered in ED Medications - No data to display   Initial Impression / Assessment and Plan / ED Course  I have reviewed the triage vital signs and the nursing notes.  Pertinent labs & imaging results that were available during my care of the patient were reviewed by me and considered in my medical decision making (see chart for details).     Knee immobilizer, cane or walker (pt has both),  Ice tx, heat on day 3. Recheck by pcp if not improved over the next week.  Final Clinical Impressions(s) / ED Diagnoses   Final diagnoses:  Effusion of left knee    New Prescriptions New Prescriptions   HYDROCODONE-ACETAMINOPHEN (NORCO/VICODIN) 5-325 MG TABLET    Take 1 tablet by mouth every 4  (four) hours as needed.     Evalee Jefferson, PA-C 01/14/17 1717    Pattricia Boss, MD 01/14/17 2325

## 2017-01-26 ENCOUNTER — Encounter: Payer: Self-pay | Admitting: Family Medicine

## 2017-01-26 ENCOUNTER — Ambulatory Visit (INDEPENDENT_AMBULATORY_CARE_PROVIDER_SITE_OTHER): Payer: Medicare HMO | Admitting: Family Medicine

## 2017-01-26 VITALS — BP 138/72 | HR 82 | Temp 98.7°F | Resp 16 | Ht 72.0 in | Wt 191.0 lb

## 2017-01-26 DIAGNOSIS — S8992XD Unspecified injury of left lower leg, subsequent encounter: Secondary | ICD-10-CM

## 2017-01-26 DIAGNOSIS — M179 Osteoarthritis of knee, unspecified: Secondary | ICD-10-CM | POA: Insufficient documentation

## 2017-01-26 DIAGNOSIS — M1710 Unilateral primary osteoarthritis, unspecified knee: Secondary | ICD-10-CM | POA: Insufficient documentation

## 2017-01-26 DIAGNOSIS — M1712 Unilateral primary osteoarthritis, left knee: Secondary | ICD-10-CM

## 2017-01-26 NOTE — Progress Notes (Signed)
   Subjective:    Patient ID: [name] [name], male    DOB: 1941-06-04, 76 y.o.   MRN: [id number]  Patient presents for ER F/U (knee pain) Patient with left knee pain. He was seen in the emergency room on March 2 after tripping in falling at the gym. He describes falling forward landing on his knee which was flexed as he was stepping over a weight bench. His x-ray in the emergency room showed tricompartmental osteoarthritis with spurring small knee effusion no definite fracture seen but he also had some spurring along the tibial spine He's been using a knee immobilizer as well as a cane as well as the use of ice is not on NSAIDs secondary to his Xarelto and 1 functioning kidney He was given Norco from ER #15 tablets,he takes at bedtime as needed  Swelling has not improved still has pain with ROM   Review Of Systems:  GEN- denies fatigue, fever, weight loss,weakness, recent illness HEENT- denies eye drainage, change in vision, nasal discharge, CVS- denies chest pain, palpitations RESP- denies SOB, cough, wheeze ABD- denies N/V, change in stools, abd pain GU- denies dysuria, hematuria, dribbling, incontinence MSK- + joint pain, muscle aches, injury Neuro- denies headache, dizziness, syncope, seizure activity       Objective:    BP 138/72   Pulse 82   Temp 98.7 F (37.1 C) (Oral)   Resp 16   Ht 6' (1.829 m)   Wt 191 lb (86.6 kg)   SpO2 99%   BMI 25.90 kg/m  GEN- NAD, alert and oriented x3 MSK- Left knee- +swelling/effusin, bruising medial aspect up to mid medal thigh, assymetrical swelling lateral inferior knee, decreased Flexion, able to extend, pain with ROM  EXT- No edema,vaircose veins bilat  Pulses- Radial- 2+        Assessment & Plan:      Problem List Items Addressed This Visit    OA (osteoarthritis) of knee - Primary    Known OA, now with fall, effusion that has not resolved, decreased ROM Possible meniscus injury with his fall Will get appt with orthopedics may  need fluid removed Cortisone shot with his meds and medical history is best bet He is using norco minimally       Relevant Orders   AMB referral to orthopedics    Other Visit Diagnoses    Injury of left knee, subsequent encounter       Relevant Orders   AMB referral to orthopedics      Note: This dictation was prepared with Dragon dictation along with smaller phrase technology. Any transcriptional errors that result from this process are unintentional.

## 2017-01-26 NOTE — Patient Instructions (Signed)
F/U as previous  Referral to orthopedics

## 2017-01-26 NOTE — Assessment & Plan Note (Signed)
Known OA, now with fall, effusion that has not resolved, decreased ROM Possible meniscus injury with his fall Will get appt with orthopedics may need fluid removed Cortisone shot with his meds and medical history is best bet He is using norco minimally

## 2017-01-27 ENCOUNTER — Ambulatory Visit (INDEPENDENT_AMBULATORY_CARE_PROVIDER_SITE_OTHER): Payer: Medicare HMO | Admitting: Family

## 2017-01-27 ENCOUNTER — Encounter (INDEPENDENT_AMBULATORY_CARE_PROVIDER_SITE_OTHER): Payer: Self-pay | Admitting: Family

## 2017-01-27 VITALS — Ht 72.0 in | Wt 191.0 lb

## 2017-01-27 DIAGNOSIS — M1712 Unilateral primary osteoarthritis, left knee: Secondary | ICD-10-CM | POA: Diagnosis not present

## 2017-01-27 DIAGNOSIS — R339 Retention of urine, unspecified: Secondary | ICD-10-CM | POA: Diagnosis not present

## 2017-01-27 MED ORDER — LIDOCAINE HCL 1 % IJ SOLN
5.0000 mL | INTRAMUSCULAR | Status: AC | PRN
  Administered 2017-01-27: 5 mL

## 2017-01-27 MED ORDER — METHYLPREDNISOLONE ACETATE 40 MG/ML IJ SUSP
40.0000 mg | INTRAMUSCULAR | Status: AC | PRN
  Administered 2017-01-27: 40 mg via INTRA_ARTICULAR

## 2017-01-27 NOTE — Progress Notes (Signed)
Office Visit Note   Patient: [name] [name]           Date of Birth: 09/04/1941           MRN: [id number] Visit Date: 01/27/2017              Requested by: Alycia Rossetti, MD [address] [place], [place] [address] PCP: Vic Blackbird, MD  Chief Complaint  Patient presents with  . Left Knee - Pain    Er visit 01/14/17    HPI: Patient was seen in the ER on 01/14/17 for left knee pain. X rays were obtained.  Patient states that he did fall and land directly on his knee. There is bruising medially and there is some swelling. He states that he had pain posteriorly and that it runs down his calf. He does ambulate with a cane. Pamella Pert, RMA  The patient is a 76 year old gentleman who presents today for evaluation of left knee pain. He has had chronic knee pain for quite some time. States he knows he has osteoarthritis. Did have radiographs on March 2 after visit to the emergency room these were unrevealing for acute pathology. Complains of him knee pain and swelling increased posterior and medial knee pain. Does have some intermittent calf pain as well. Has been having to use a cane for ambulation. Complains of locking and giving way of the knee. Increase of pain ascending hills.    Assessment & Plan: Visit Diagnoses:  1. Primary osteoarthritis of left knee     Plan: Injection today. If continued pain may consider MRI to r/o meniscal pathology. Discussed OA left knee and consideration for TKA.   Follow-Up Instructions: Return in about 4 weeks (around 02/24/2017), or if symptoms worsen or fail to improve.   Physical Exam  Constitutional: Appears well-developed.  Head: Normocephalic.  Eyes: EOM are normal.  Neck: Normal range of motion.  Cardiovascular: Normal rate.   Pulmonary/Chest: Effort normal.  Neurological: Is alert.  Skin: Skin is warm.  Psychiatric: Has a normal mood and affect. Does have an antalgic gait.  Left Knee Exam   Tenderness  The patient is  experiencing tenderness in the medial joint line and medial retinaculum.  Range of Motion  The patient has normal left knee ROM.  Muscle Strength   The patient has normal left knee strength.  Tests  Varus: negative Valgus: negative  Other  Erythema: absent Sensation: normal Swelling: moderate      Imaging: No results found.  Labs: Lab Results  Component Value Date   ESRSEDRATE 5 07/29/2014   CRP 1.5 07/29/2014   REPTSTATUS 03/18/2015 FINAL 03/15/2015   GRAMSTAIN  03/15/2015    RARE WBC PRESENT, PREDOMINANTLY PMN NO SQUAMOUS EPITHELIAL CELLS SEEN FEW GRAM NEGATIVE RODS RARE GRAM POSITIVE COCCI IN PAIRS    CULT  03/15/2015    FEW SERRATIA MARCESCENS Performed at [facility] 03/15/2015    Orders:  Orders Placed This Encounter  Procedures  . Large Joint Injection/Arthrocentesis   No orders of the defined types were placed in this encounter.    Procedures: Large Joint Inj Date/Time: 01/27/2017 3:49 PM Performed by: Suzan Slick Authorized by: Dondra Prader R   Consent Given by:  Patient Site marked: the procedure site was marked   Timeout: prior to procedure the correct patient, procedure, and site was verified   Indications:  Pain and diagnostic evaluation Location:  Knee Site:  L  knee Needle Size:  22 G Needle Length:  1.5 inches Approach:  Superolateral Ultrasound Guidance: No   Fluoroscopic Guidance: No   Arthrogram: No   Medications:  5 mL lidocaine 1 %; 40 mg methylPREDNISolone acetate 40 MG/ML Aspiration Attempted: Yes   Aspirate amount (mL):  0 Patient tolerance:  Patient tolerated the procedure well with no immediate complications    Clinical Data: No additional findings.  Subjective: Review of Systems  Constitutional: Negative for chills and fever.    Objective: Vital Signs: Ht 6' (1.829 m)   Wt 191 lb (86.6 kg)   BMI 25.90 kg/m   Specialty Comments:  No specialty comments  available.  PMFS History: Patient Active Problem List   Diagnosis Date Noted  . Primary osteoarthritis of left knee 01/27/2017  . OA (osteoarthritis) of knee 01/26/2017  . Spinal stenosis of lumbar region 12/08/2016  . Reflux 11/26/2011  . Mixed hyperlipidemia 10/05/2008  . CAD, NATIVE VESSEL 10/05/2008  . Atrial fibrillation ([place]) 10/05/2008  . CKD (chronic kidney disease), stage III 10/05/2008   Past Medical History:  Diagnosis Date  . Arthritis    shoulders and ribs   . Atrial fibrillation ([place])    atrial fib/ LOV [name] PA 04/11/12 EPIC, - CHEST X RAY, EKG 5/13 EPIC  . CAD (coronary artery disease)     s/p NSTEMI 04/08;  [place] 02/2007: Proximal LAD 75%, mid LAD 99%, proximal RCA 25%.  PCI:  Cypher DES to the proximal and mid LAD.  Last nuclear study 11/2011 (after a trip to the ED with CP): EF 58%, low risk study with small inferior wall infarct at the mid and basal level, no ischemia.  Last echo 01/2010: Mild LVH, EF 55-60%, mild AI, mild MR, severely dilated LA and RA  . Contrast dye induced nephropathy    Hx ARF secondary to contrast nephropathy  . Dyslipidemia   . Epididymitis   . History of blood transfusion   . Myocardial infarction 2008  . Pleurisy    2012  . Pneumonia    hx of   . 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 Spinal stenosis   . Urothelial cancer ([place])    Dr. [name]    Family History  Problem Relation Age of Onset  . Cancer Mother   . Diabetes type II Mother   . Hyperlipidemia Mother   . Coronary artery disease Father   . Heart attack Father     Past Surgical History:  Procedure Laterality Date  . arm surgery     orif right elbow  . CARDIAC CATHETERIZATION  2008  . coronary stents     . CYSTECTOMY  07/15/08  . CYSTOSCOPY WITH BIOPSY  03/23/2012   Procedure: CYSTOSCOPY WITH BIOPSY;  Surgeon: [name], MD;  Location: [facility];  Service: Urology;  Laterality: Right;   BRUSH BIOPSY RIGHT URETERAL STENT  . CYSTOSCOPY WITH URETEROSCOPY  03/23/2012   Procedure:  CYSTOSCOPY WITH URETEROSCOPY;  Surgeon: [name], MD;  Location: [facility];  Service: Urology;  Laterality: Right;    **OR Room #8 requested**  C-ARM   . CYSTOSCOPY/RETROGRADE/URETEROSCOPY  02/15/2012   Procedure: CYSTOSCOPY/RETROGRADE/URETEROSCOPY;  Surgeon: [name], MD;  Location: [facility];  Service: Urology;  Laterality: Right;  C-ARM  . URETER SURGERY     Social History   Occupational History  . Retired    Social History Main Topics  . Smoking status: Former Smoker    Packs/day: 2.00    Years:  10.00    Types: Cigarettes, Cigars    Quit date: 11/15/1981  . Smokeless tobacco: Never Used  . Alcohol use No  . Drug use: No  . Sexual activity: Not Currently

## 2017-02-24 ENCOUNTER — Ambulatory Visit (INDEPENDENT_AMBULATORY_CARE_PROVIDER_SITE_OTHER): Payer: Medicare HMO | Admitting: Orthopedic Surgery

## 2017-03-15 DIAGNOSIS — N2581 Secondary hyperparathyroidism of renal origin: Secondary | ICD-10-CM | POA: Diagnosis not present

## 2017-03-15 DIAGNOSIS — R339 Retention of urine, unspecified: Secondary | ICD-10-CM | POA: Diagnosis not present

## 2017-03-15 DIAGNOSIS — N183 Chronic kidney disease, stage 3 (moderate): Secondary | ICD-10-CM | POA: Diagnosis not present

## 2017-03-15 DIAGNOSIS — D631 Anemia in chronic kidney disease: Secondary | ICD-10-CM | POA: Diagnosis not present

## 2017-03-15 DIAGNOSIS — C669 Malignant neoplasm of unspecified ureter: Secondary | ICD-10-CM | POA: Diagnosis not present

## 2017-03-15 DIAGNOSIS — I2510 Atherosclerotic heart disease of native coronary artery without angina pectoris: Secondary | ICD-10-CM | POA: Diagnosis not present

## 2017-03-15 DIAGNOSIS — I252 Old myocardial infarction: Secondary | ICD-10-CM | POA: Diagnosis not present

## 2017-03-15 DIAGNOSIS — E782 Mixed hyperlipidemia: Secondary | ICD-10-CM | POA: Diagnosis not present

## 2017-03-15 DIAGNOSIS — I482 Chronic atrial fibrillation: Secondary | ICD-10-CM | POA: Diagnosis not present

## 2017-06-09 DIAGNOSIS — R69 Illness, unspecified: Secondary | ICD-10-CM | POA: Diagnosis not present

## 2017-07-12 ENCOUNTER — Ambulatory Visit (INDEPENDENT_AMBULATORY_CARE_PROVIDER_SITE_OTHER): Payer: Medicare HMO | Admitting: Family Medicine

## 2017-07-12 ENCOUNTER — Encounter: Payer: Self-pay | Admitting: Family Medicine

## 2017-07-12 VITALS — BP 132/68 | HR 64 | Temp 98.4°F | Resp 14 | Ht 72.0 in | Wt 193.0 lb

## 2017-07-12 DIAGNOSIS — G8929 Other chronic pain: Secondary | ICD-10-CM | POA: Diagnosis not present

## 2017-07-12 DIAGNOSIS — M545 Low back pain, unspecified: Secondary | ICD-10-CM

## 2017-07-12 DIAGNOSIS — M48061 Spinal stenosis, lumbar region without neurogenic claudication: Secondary | ICD-10-CM

## 2017-07-12 DIAGNOSIS — M4696 Unspecified inflammatory spondylopathy, lumbar region: Secondary | ICD-10-CM

## 2017-07-12 DIAGNOSIS — M47816 Spondylosis without myelopathy or radiculopathy, lumbar region: Secondary | ICD-10-CM

## 2017-07-12 DIAGNOSIS — M9983 Other biomechanical lesions of lumbar region: Secondary | ICD-10-CM | POA: Diagnosis not present

## 2017-07-12 DIAGNOSIS — M5136 Other intervertebral disc degeneration, lumbar region: Secondary | ICD-10-CM | POA: Diagnosis not present

## 2017-07-12 NOTE — Patient Instructions (Addendum)
Take Tylenol 500mg  ( Take 1-2 tablets)  twice a day for pain Referral to neurosurgeon  F/U as previous

## 2017-07-12 NOTE — Progress Notes (Signed)
   Subjective:    Patient ID: [name] [name], male    DOB: [birth year], 76 y.o.   MRN: [id number]  Patient presents for Lower Back Pain (x1 year- intermittent pain in lumbar area)   Pt here with low back pain worsening over the past Few months  . He has history of severe left foraminal stenosis on left side at L5 and moderate at L4-L5, facet disease, DDD. HIstory of compression fracture at T12 and denies any radiating symptoms it is mostly just across the lower back but he gets pain or discomfort if he sits too long or he is doing certain activities. He is change of his routine at the gym that he has not been a lot of pressure on his spine. It has not had any change in bowel or bladder. He has not taken anything over-the-counter as he is very careful as he only has a single kidney functioning. 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 and has chronic right-sided weakness both in the upper extremity in the lower extremity  Review Of Systems:  GEN- denies fatigue, fever, weight loss,weakness, recent illness HEENT- denies eye drainage, change in vision, nasal discharge, CVS- denies chest pain, palpitations RESP- denies SOB, cough, wheeze ABD- denies N/V, change in stools, abd pain GU- denies dysuria, hematuria, dribbling, incontinence MSK-+joint pain, muscle aches, injury Neuro- denies headache, dizziness, syncope, seizure activity       Objective:    BP 132/68   Pulse 64   Temp 98.4 F (36.9 C) (Oral)   Resp 14   Ht 6' (1.829 m)   Wt 193 lb (87.5 kg)   SpO2 99%   BMI 26.18 kg/m  GEN- NAD, alert and oriented x3 MSK- TTP lower lumbar spine, and paraspinals, neg SLR, decreaed ROM Spine/hips/knees. 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 Reviewed MRI from Dec at bedside. We discussed getting a consult with the spine surgeon so that he knows what the progression may look like what he can do to help [place] in the future. I not continue surgical intervention at this time . he may be a candidate for injections if he starts having more radicular pain but that is not present at this time either. He is staying active with more cardio-like exercises    DDD (degenerative disc disease), lumbar       Foraminal stenosis of lumbar region       Chronic bilateral low back pain without sciatica          Note: This dictation was prepared with Dragon dictation along with smaller phrase technology. Any transcriptional errors that result from this process are unintentional.

## 2017-08-09 ENCOUNTER — Encounter: Payer: Self-pay | Admitting: Family Medicine

## 2017-08-18 DIAGNOSIS — M545 Low back pain, unspecified: Secondary | ICD-10-CM | POA: Insufficient documentation

## 2017-08-18 DIAGNOSIS — G8929 Other chronic pain: Secondary | ICD-10-CM | POA: Diagnosis not present

## 2017-08-18 DIAGNOSIS — Z6825 Body mass index (BMI) 25.0-25.9, adult: Secondary | ICD-10-CM | POA: Diagnosis not present

## 2017-08-18 DIAGNOSIS — R030 Elevated blood-pressure reading, without diagnosis of hypertension: Secondary | ICD-10-CM | POA: Diagnosis not present

## 2017-09-12 DIAGNOSIS — D2261 Melanocytic nevi of right upper limb, including shoulder: Secondary | ICD-10-CM | POA: Diagnosis not present

## 2017-09-12 DIAGNOSIS — D2262 Melanocytic nevi of left upper limb, including shoulder: Secondary | ICD-10-CM | POA: Diagnosis not present

## 2017-09-12 DIAGNOSIS — D485 Neoplasm of uncertain behavior of skin: Secondary | ICD-10-CM | POA: Diagnosis not present

## 2017-09-12 DIAGNOSIS — D0471 Carcinoma in situ of skin of right lower limb, including hip: Secondary | ICD-10-CM | POA: Diagnosis not present

## 2017-09-12 DIAGNOSIS — D1801 Hemangioma of skin and subcutaneous tissue: Secondary | ICD-10-CM | POA: Diagnosis not present

## 2017-09-12 DIAGNOSIS — D2272 Melanocytic nevi of left lower limb, including hip: Secondary | ICD-10-CM | POA: Diagnosis not present

## 2017-09-12 DIAGNOSIS — D2271 Melanocytic nevi of right lower limb, including hip: Secondary | ICD-10-CM | POA: Diagnosis not present

## 2017-09-12 DIAGNOSIS — L821 Other seborrheic keratosis: Secondary | ICD-10-CM | POA: Diagnosis not present

## 2017-09-12 DIAGNOSIS — D225 Melanocytic nevi of trunk: Secondary | ICD-10-CM | POA: Diagnosis not present

## 2017-09-14 ENCOUNTER — Ambulatory Visit (INDEPENDENT_AMBULATORY_CARE_PROVIDER_SITE_OTHER): Payer: Medicare HMO | Admitting: Family Medicine

## 2017-09-14 ENCOUNTER — Encounter: Payer: Self-pay | Admitting: Family Medicine

## 2017-09-14 VITALS — BP 122/80 | HR 60 | Temp 98.4°F | Resp 16 | Ht 72.0 in | Wt 186.0 lb

## 2017-09-14 DIAGNOSIS — I2510 Atherosclerotic heart disease of native coronary artery without angina pectoris: Secondary | ICD-10-CM | POA: Diagnosis not present

## 2017-09-14 DIAGNOSIS — Z23 Encounter for immunization: Secondary | ICD-10-CM | POA: Diagnosis not present

## 2017-09-14 DIAGNOSIS — R972 Elevated prostate specific antigen [PSA]: Secondary | ICD-10-CM | POA: Diagnosis not present

## 2017-09-14 DIAGNOSIS — Z0000 Encounter for general adult medical examination without abnormal findings: Secondary | ICD-10-CM

## 2017-09-14 DIAGNOSIS — N183 Chronic kidney disease, stage 3 unspecified: Secondary | ICD-10-CM

## 2017-09-14 LAB — URINALYSIS, ROUTINE W REFLEX MICROSCOPIC
BILIRUBIN URINE: NEGATIVE
Glucose, UA: NEGATIVE
HGB URINE DIPSTICK: NEGATIVE
KETONES UR: NEGATIVE
Leukocytes, UA: NEGATIVE
Nitrite: NEGATIVE
PROTEIN: NEGATIVE
Specific Gravity, Urine: 1.015 (ref 1.001–1.03)
pH: 7 (ref 5.0–8.0)

## 2017-09-14 NOTE — Progress Notes (Signed)
Subjective:   Patient presents for Medicare Annual/Subsequent preventive examination.    Pt here for annual wellness exam  Medications reviewed  CAD- taking lipitor, Xarelto for A fib, lasix , followed by cardiology , no CP/SOB   Gets some swelling in his left foot at arch, goes down on its own, no pain  OA/Spinal stenosis- he tries to stay active, does not want any meds, has not taken any tylenol  told by neurosurgery no intervention due to risk     Review Past Medical/Family/Social: Per EMR    Risk Factors  Current exercise habits: walks  Dietary issues discussed: no concerns   Cardiac risk factors: CAD   Depression Screen  (Note: if answer to either of the following is "Yes", a more complete depression screening is indicated)  Over the past two weeks, have you felt down, depressed or hopeless? No Over the past two weeks, have you felt little interest or pleasure in doing things?yes Have you lost interest or pleasure in daily life? No Do you often feel hopeless? No Do you cry easily over simple problems? No   Activities of Daily Living  In your present state of health, do you have any difficulty performing the following activities?:  Driving? No  Managing money? No  Feeding yourself? No  Getting from bed to chair? No  Climbing a flight of stairs? No  Preparing food and eating?: No  Bathing or showering? No  Getting dressed: No  Getting to the toilet? No  Using the toilet:No  Moving around from place to place: No  In the past year have you fallen or had a near fall?oYES - when knee gave out on him  Are you sexually active? YES Do you have more than one partner? No   Hearing Difficulties: yES Do you often ask people to speak up or repeat themselves? No  Do you experience ringing or noises in your ears? No Do you have difficulty understanding soft or whispered voices? yES Do you feel that you have a problem with memory? No Do you often misplace items? No  Do you  feel safe at home? Yes  Cognitive Testing  Alert? Yes Normal Appearance?Yes  Oriented to person? Yes Place? Yes  Time? Yes  Recall of three objects? Yes  Can perform simple calculations? Yes  Displays appropriate judgment?Yes  Can read the correct time from a watch face?Yes   List the Names of Other Physician/Practitioners you currently use:    Urology- Dr. Sandra Cockayne  Cardiology- DR. Adel Neurosurgery- not pursing anything for spinal stenosis   Screening Tests / Date Colonoscopy    utd       Pneumonia uTD        Zostavax  UTD Influenza Vaccine  dUE  Tetanus/tdap UTD   ROS: GEN- denies fatigue, fever, weight loss,weakness, recent illness HEENT- denies eye drainage, change in vision, nasal discharge, CVS- denies chest pain, palpitations RESP- denies SOB, cough, wheeze ABD- denies N/V, change in stools, abd pain GU- denies dysuria, hematuria, dribbling, incontinence MSK- + joint pain, muscle aches, injury Neuro- denies headache, dizziness, syncope, seizure activity  Physical: GEN- NAD, alert and oriented x3 HEENT- PERRL, EOMI, non injected sclera, pink conjunctiva, MMM, oropharynx clear, TM clear bilat no effusion  Neck- Supple, no thryomegaly CVS- RRR, no murmur RESP-CTAB ABD-NABS, soft,NT,ND  Psych- normal affect and mood  EXT- No edema, varicose veins  Pulses- Radial 2+, DP- 1+     Assessment:  Annual wellness medicare exam   Plan:    During the course of the visit the patient was educated and counseled about appropriate screening and preventive services including:  Flu shot given  He declines audiology, has had hearing aides in past 10 years ago, they are now broken, he can not afford right now   Screen Neg for depression   Fasting labs to be obtained   Discussed Advanced directives handout given    CAD/A fib- anticoualuated without any difficulty, has f/u cardiology, BP controlled  CKD- f/u nephrology- check renal  function, PTH, on calcitriol  Eleveated PSA- recheck PSA, has urology f/u , will likely be able to stop going at his age, he will discuss at his next visit     Diet review for nutrition referral? Yes ____ Not Indicated __x__  Patient Instructions (the written plan) was given to the patient.  Medicare Attestation  I have personally reviewed:  The patient's medical and social history  Their use of alcohol, tobacco or illicit drugs  Their current medications and supplements  The patient's functional ability including ADLs,fall risks, home safety risks, cognitive, and hearing and visual impairment  Diet and physical activities  Evidence for depression or mood disorders  The patient's weight, height, BMI, and visual acuity have been recorded in the chart. I have made referrals, counseling, and provided education to the patient based on review of the above and I have provided the patient with a written personalized care plan for preventive services.

## 2017-09-14 NOTE — Patient Instructions (Signed)
I recommend eye visit once a year I recommend dental visit every 6 months Goal is to  Exercise 30 minutes 5 days a week We will send a letter with lab results  Flu shot given  F/U 6 months

## 2017-09-15 LAB — CBC WITH DIFFERENTIAL/PLATELET
BASOS PCT: 1 %
Basophils Absolute: 63 cells/uL (ref 0–200)
Eosinophils Absolute: 113 cells/uL (ref 15–500)
Eosinophils Relative: 1.8 %
HCT: 41.7 % (ref 38.5–50.0)
HEMOGLOBIN: 14 g/dL (ref 13.2–17.1)
Lymphs Abs: 914 cells/uL (ref 850–3900)
MCH: 30.6 pg (ref 27.0–33.0)
MCHC: 33.6 g/dL (ref 32.0–36.0)
MCV: 91 fL (ref 80.0–100.0)
MONOS PCT: 8.6 %
MPV: 11.1 fL (ref 7.5–12.5)
NEUTROS ABS: 4668 {cells}/uL (ref 1500–7800)
Neutrophils Relative %: 74.1 %
Platelets: 174 10*3/uL (ref 140–400)
RBC: 4.58 10*6/uL (ref 4.20–5.80)
RDW: 12.7 % (ref 11.0–15.0)
Total Lymphocyte: 14.5 %
WBC mixed population: 542 cells/uL (ref 200–950)
WBC: 6.3 10*3/uL (ref 3.8–10.8)

## 2017-09-15 LAB — COMPLETE METABOLIC PANEL WITH GFR
AG RATIO: 1.7 (calc) (ref 1.0–2.5)
ALT: 13 U/L (ref 9–46)
AST: 20 U/L (ref 10–35)
Albumin: 4.4 g/dL (ref 3.6–5.1)
Alkaline phosphatase (APISO): 55 U/L (ref 40–115)
BILIRUBIN TOTAL: 0.9 mg/dL (ref 0.2–1.2)
BUN/Creatinine Ratio: 18 (calc) (ref 6–22)
BUN: 22 mg/dL (ref 7–25)
CALCIUM: 9 mg/dL (ref 8.6–10.3)
CO2: 28 mmol/L (ref 20–32)
Chloride: 103 mmol/L (ref 98–110)
Creat: 1.25 mg/dL — ABNORMAL HIGH (ref 0.70–1.18)
GFR, Est African American: 64 mL/min/{1.73_m2} (ref >=60)
GFR, Est Non African American: 55 mL/min/{1.73_m2} — ABNORMAL LOW (ref >=60)
Globulin: 2.6 g/dL (calc) (ref 1.9–3.7)
Glucose, Bld: 100 mg/dL — ABNORMAL HIGH (ref 65–99)
POTASSIUM: 4.3 mmol/L (ref 3.5–5.3)
Sodium: 141 mmol/L (ref 135–146)
Total Protein: 7 g/dL (ref 6.1–8.1)

## 2017-09-15 LAB — PSA: PSA: 2.9 ng/mL (ref <=4.0)

## 2017-09-15 LAB — LIPID PANEL
CHOL/HDL RATIO: 2.7 (calc) (ref <5.0)
CHOLESTEROL: 139 mg/dL (ref <200)
HDL: 51 mg/dL (ref >40)
LDL CHOLESTEROL (CALC): 73 mg/dL
Non-HDL Cholesterol (Calc): 88 mg/dL (calc) (ref <130)
Triglycerides: 72 mg/dL (ref <150)

## 2017-09-15 LAB — PTH, INTACT AND CALCIUM
Calcium: 9 mg/dL (ref 8.6–10.3)
PTH: 109 pg/mL — ABNORMAL HIGH (ref 14–64)

## 2017-09-22 ENCOUNTER — Encounter: Payer: Self-pay | Admitting: *Deleted

## 2017-09-23 DIAGNOSIS — C661 Malignant neoplasm of right ureter: Secondary | ICD-10-CM | POA: Diagnosis not present

## 2017-10-03 ENCOUNTER — Encounter: Payer: Self-pay | Admitting: Cardiovascular Disease

## 2017-10-03 ENCOUNTER — Ambulatory Visit: Payer: Medicare HMO | Admitting: Cardiovascular Disease

## 2017-10-03 VITALS — BP 136/70 | HR 61 | Ht 72.0 in | Wt 188.8 lb

## 2017-10-03 DIAGNOSIS — E782 Mixed hyperlipidemia: Secondary | ICD-10-CM

## 2017-10-03 DIAGNOSIS — I482 Chronic atrial fibrillation: Secondary | ICD-10-CM | POA: Diagnosis not present

## 2017-10-03 DIAGNOSIS — I4821 Permanent atrial fibrillation: Secondary | ICD-10-CM

## 2017-10-03 DIAGNOSIS — I2510 Atherosclerotic heart disease of native coronary artery without angina pectoris: Secondary | ICD-10-CM | POA: Diagnosis not present

## 2017-10-03 NOTE — Progress Notes (Signed)
Cardiology Office Note Date:  10/03/2017   ID:  [name] [name], DOB 06-25-1941, MRN [id number]  PCP:  Alycia Rossetti, MD  Cardiologist:  Janne Lab, MD    Chief Complaint  Patient presents with  . Coronary Artery Disease  . Atrial Fibrillation     History of Present Illness: [name] [name] is a 76 y.o. male who presents for follow-up evaluation. The patient is here alone today. He has been followed for coronary artery disease with initial presentation in 2008 when he had a non-ST elevation infarct. He is treated with drug-eluting stents in the proximal and mid LAD. His LV function is been normal. He's had no problems with angina over the years. He has not required repeat heart catheterization. The patient also has developed permanent atrial fibrillation.  The patient is here with his wife today. He's doing well. Continues to exercise regularly without exertional symptoms. No chest pain, dyspnea, palpitations, or edema. No complaints today.  Past Medical History:  Diagnosis Date  . Arthritis    shoulders and ribs   . Atrial fibrillation ([place])    atrial fib/ LOV [name] PA 04/11/12 EPIC, - CHEST X RAY, EKG 5/13 EPIC  . CAD (coronary artery disease)     s/p NSTEMI 04/08;  [place] 02/2007: Proximal LAD 75%, mid LAD 99%, proximal RCA 25%.  PCI:  Cypher DES to the proximal and mid LAD.  Last nuclear study 11/2011 (after a trip to the ED with CP): EF 58%, low risk study with small inferior wall infarct at the mid and basal level, no ischemia.  Last echo 01/2010: Mild LVH, EF 55-60%, mild AI, mild MR, severely dilated LA and RA  . Contrast dye induced nephropathy    Hx ARF secondary to contrast nephropathy  . Dyslipidemia   . Epididymitis   . History of blood transfusion   . Myocardial infarction ([place]) 2008  . Pleurisy    2012  . Pneumonia    hx of   . 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 Spinal stenosis   . 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 [name]    Past Surgical History:  Procedure  Laterality Date  . arm surgery     orif right elbow  . CARDIAC CATHETERIZATION  2008  . coronary stents     . CYSTECTOMY  07/15/08  . CYSTOSCOPY WITH BIOPSY Right 03/23/2012   Performed by [name], MD at [facility]  . CYSTOSCOPY WITH STENT PLACEMENT Right 02/15/2012   Performed by [name], MD at [facility] ORS  . CYSTOSCOPY WITH URETEROSCOPY Right 03/23/2012   Performed by [name], MD at [facility] ORS  . CYSTOSCOPY/RETROGRADE/URETEROSCOPY Right 02/15/2012   Performed by [name], MD at [facility] ORS  . ROBOT ASSITED LAPAROSCOPIC NEPHROURETERECTOMY Right 05/04/2012   Performed by [name], MD at [facility] ORS  . URETER SURGERY      Current Outpatient Medications  Medication Sig Dispense Refill  . atorvastatin (LIPITOR) 40 MG tablet Take 1 tablet (40 mg total) by mouth every evening. 90 tablet 3  . calcitRIOL (ROCALTROL) 0.25 MCG capsule Take 1 capsule (0.25 mcg total) by mouth daily after breakfast. 90 capsule 3  . furosemide (LASIX) 40 MG tablet Take 1 tablet (40 mg total) by mouth daily. 90 tablet 3  . Inositol Niacinate (NIACIN FLUSH FREE) 500 MG CAPS Take 1 capsule by mouth daily.     Marland Kitchen NITROSTAT 0.4 MG SL tablet Place 1 tablet (0.4 mg total)  under the tongue every 5 (five) minutes as needed for chest pain. 25 tablet 4  . Omega-3 Fatty Acids (FISH OIL) 1200 MG CAPS Take 1 capsule by mouth daily.     . Rivaroxaban (XARELTO) 15 MG TABS tablet Take 1 tablet (15 mg total) by mouth daily with supper. 90 tablet 3  . vitamin B-12 (CYANOCOBALAMIN) 500 MCG tablet Take 500 mcg by mouth daily.     No current facility-administered medications for this visit.     Allergies:   Contrast media [iodinated diagnostic agents]; Iodine-131; and Other   Social History:  The patient  reports that he quit smoking about 35 years ago. His smoking use included cigarettes and cigars. He has a 20.00 pack-year smoking history. he has never used smokeless tobacco. He reports that he does not drink alcohol or use drugs.    Family History:  The patient's family history includes Cancer in his mother; Coronary artery disease in his father; Diabetes type II in his mother; Heart attack in his father; Hyperlipidemia in his mother.    ROS:  Please see the history of present illness.  All other systems are reviewed and negative.   PHYSICAL EXAM: VS:  BP 136/70   Pulse 61   Ht 6' (1.829 m)   Wt 188 lb 12.8 oz (85.6 kg)   BMI 25.61 kg/m  , BMI Body mass index is 25.61 kg/m. GEN: Well nourished, well developed, in no acute distress  HEENT: normal  Neck: no JVD, no masses. No carotid bruits Cardiac: irregular without murmur or gallop                Respiratory:  clear to auscultation bilaterally, normal work of breathing GI: soft, nontender, nondistended, + BS MS: no deformity or atrophy  Ext: no pretibial edema, pedal pulses 2+= bilaterally Skin: warm and dry, no rash Neuro:  Strength and sensation are intact Psych: euthymic mood, full affect  EKG:  EKG is ordered today. The ekg ordered today shows atrial fibrillation 61 bpm, otherwise within normal limits  Recent Labs: 09/14/2017: ALT 13; BUN 22; Creat 1.25; Hemoglobin 14.0; Platelets 174; Potassium 4.3; Sodium 141   Lipid Panel     Component Value Date/Time   CHOL 139 09/14/2017 1004   TRIG 72 09/14/2017 1004   HDL 51 09/14/2017 1004   CHOLHDL 2.7 09/14/2017 1004   VLDL 23.0 01/27/2010 0937   LDLCALC 48 01/27/2010 0937      Wt Readings from Last 3 Encounters:  10/03/17 188 lb 12.8 oz (85.6 kg)  09/14/17 186 lb (84.4 kg)  07/12/17 193 lb (87.5 kg)     ASSESSMENT AND PLAN: 1.  Permanent atrial fibrillation: remains asymptomatic. Tolerating Xarelto 15 mg daily. Pt with single kidney and borderline renal fxn for Xarelto dose reduction. I think best to keep on the 15 mg dose. No changes made to his medical regimen.   2. CAD, native vessel, without angina: pt treated with first generation DES. No antiplatelet Rx with chronic oral  anticoagulation. Continue atorvastatin.   3. CKD 3: continues to follow with nephrology.   4. Chronic anticoagulation: tolerating without bleeding problems. Reviewed creatinine clearance and he is on the border of dose reduction (15 versus 20 mg). Will continue 15 mg daily.  Current medicines are reviewed with the patient today.  The patient does not have concerns regarding medicines.  Labs/ tests ordered today include:   Orders Placed This Encounter  Procedures  . EKG 12-Lead    Disposition:  FU one year  Signed, Janne Lab, MD  10/03/2017 1:19 PM    [facility] [address], [place], [place]  [address] Phone: [phone]; Fax: [phone]

## 2017-10-03 NOTE — Patient Instructions (Signed)

## 2017-10-17 ENCOUNTER — Encounter: Payer: Self-pay | Admitting: Podiatry

## 2017-10-17 ENCOUNTER — Ambulatory Visit: Payer: Medicare HMO | Admitting: Podiatry

## 2017-10-17 DIAGNOSIS — I872 Venous insufficiency (chronic) (peripheral): Secondary | ICD-10-CM

## 2017-10-19 NOTE — Progress Notes (Signed)
   HPI: 76 year old male presenting today as a new patient with a complaint of swelling to the left anterior ankle and the dorsum of the left foot that began approximately 1 month ago.  He reports associated discoloration to the skin of these areas.  He reports nodules to the plantar aspect of the left foot.  There are no modifying factors noted.  He has not done anything to treat the symptoms.  Patient is here for further evaluation and treatment.   Past Medical History:  Diagnosis Date  . Arthritis    shoulders and ribs   . Atrial fibrillation ([place])    atrial fib/ LOV [name] PA 04/11/12 EPIC, - CHEST X RAY, EKG 5/13 EPIC  . CAD (coronary artery disease)     s/p NSTEMI 04/08;  [place] 02/2007: Proximal LAD 75%, mid LAD 99%, proximal RCA 25%.  PCI:  Cypher DES to the proximal and mid LAD.  Last nuclear study 11/2011 (after a trip to the ED with CP): EF 58%, low risk study with small inferior wall infarct at the mid and basal level, no ischemia.  Last echo 01/2010: Mild LVH, EF 55-60%, mild AI, mild MR, severely dilated LA and RA  . Contrast dye induced nephropathy    Hx ARF secondary to contrast nephropathy  . Dyslipidemia   . Epididymitis   . History of blood transfusion   . Myocardial infarction ([place]) 2008  . Pleurisy    2012  . Pneumonia    hx of   . 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 Spinal stenosis   . Urothelial cancer ([place])    Dr. [name]     Physical Exam: General: The patient is alert and oriented x3 in no acute distress.  Dermatology: Skin is warm, dry and supple bilateral lower extremities. Negative for open lesions or macerations.  Vascular: Varicosities with moderate edema noted to bilateral lower extremities palpable pedal pulses bilaterally. No erythema noted. Capillary refill within normal limits.  Neurological: Epicritic and protective threshold grossly intact bilaterally.   Musculoskeletal Exam: Range of motion within normal limits to all pedal and ankle joints bilateral.  Muscle strength 5/5 in all groups bilateral.   Assessment: - venous insufficiency BLE   Plan of Care:  - Patient evaluated. - recommended OTC knee high compression sleeves. - Return to clinic when necessary.    Edrick Kins, DPM [facility]  Dr. Edrick Kins, DPM    [name], [place] [address]                Office [phone]  Fax [phone]

## 2017-11-03 DIAGNOSIS — N183 Chronic kidney disease, stage 3 (moderate): Secondary | ICD-10-CM | POA: Diagnosis not present

## 2017-11-03 DIAGNOSIS — N2581 Secondary hyperparathyroidism of renal origin: Secondary | ICD-10-CM | POA: Diagnosis not present

## 2017-11-03 DIAGNOSIS — I482 Chronic atrial fibrillation: Secondary | ICD-10-CM | POA: Diagnosis not present

## 2017-11-03 DIAGNOSIS — D631 Anemia in chronic kidney disease: Secondary | ICD-10-CM | POA: Diagnosis not present

## 2017-11-15 ENCOUNTER — Other Ambulatory Visit: Payer: Self-pay | Admitting: Cardiovascular Disease

## 2017-11-16 ENCOUNTER — Other Ambulatory Visit: Payer: Self-pay

## 2017-11-16 NOTE — Telephone Encounter (Signed)
Pt last saw Dr Burt Knack on 10/03/17, last labs on 09/14/17 Creat 1.25, age 77, weight 85.6kg, CrCl 58.97, pt is currently on Xarelto 15mg  QD.  Based on CrCl pt should be on Xarelto 20mg  QD.  Will send message to Dr Burt Knack to see if dosage change is appropriate.

## 2017-11-17 NOTE — Telephone Encounter (Signed)
Attempted to call pt, LMOM TCB per Dr Burt Knack ok to change Xarelto rx from 15mg  QD to 20mg  QD given most recent CrCl of 58.97.

## 2017-11-17 NOTE — Telephone Encounter (Signed)
-----   Message from Sherren Mocha, MD sent at 11/17/2017  9:00 AM EST ----- Yes would be appropriate to change dose to 20 mg. Thanks Doroteo Bradford! ----- Message ----- From: Brynda Peon, RN Sent: 11/16/2017   8:19 AM To: Sherren Mocha, MD  Pt last saw Dr Burt Knack on 10/03/17, last labs on 09/14/17 Creat 1.25, age 77, weight 85.6kg, CrCl 58.97, pt is currently on Xarelto 15mg  QD.  Based on CrCl pt should be on Xarelto 20mg  QD.  Will send message to Dr Burt Knack to see if dosage change is appropriate. Please advise.  Thanks

## 2017-11-18 ENCOUNTER — Other Ambulatory Visit: Payer: Self-pay | Admitting: *Deleted

## 2017-11-18 MED ORDER — RIVAROXABAN 20 MG PO TABS: 20.0000 mg | ORAL_TABLET | Freq: Every day | ORAL | 1 refills | Status: DC

## 2017-11-18 NOTE — Progress Notes (Signed)
Age 77 Wt 85.6kg Saw Dr Burt Knack 10/03/2017  09/14/2017 Hgb 14.0 HCT 41.7 09/14/2017 SrCr 1.25 CrCl 58.96 Spoke with pt and explained that due to his improvement in CrCl  that Dr Burt Knack was sent message and he agrees he should be changed to Xarelto 20 mg daily  Pt informed of this and also called [facility] and explained that will be sending in order for Xarelto 20 mg daily as his CrCL has improved and they state will discontinue the Xarelto 15mg  daily on his med profile. Pt also instructed to place the Xarelto 15mg  up and start Xarelto 20 mg daily as soon as he obtains new RX for Xarelto and pt states understanding  [employer] and spoke with Josie at [facility] and made correction to refill order that refill sent in for 3 months supply not 30 day supply and this is changed on his refill profile

## 2017-11-25 ENCOUNTER — Other Ambulatory Visit: Payer: Self-pay | Admitting: *Deleted

## 2017-11-25 MED ORDER — RIVAROXABAN 20 MG PO TABS: 20.0000 mg | ORAL_TABLET | Freq: Every day | ORAL | 0 refills | Status: DC

## 2017-11-25 NOTE — Telephone Encounter (Signed)
Patient called and stated that he will take his last dose of xarelto tomorrow and he is requesting a short term rx be sent to [facility] to last until his mail order arrives. Thanks, MI

## 2017-11-30 ENCOUNTER — Telehealth: Payer: Self-pay | Admitting: Family Medicine

## 2017-11-30 NOTE — Telephone Encounter (Signed)
Patient needs refills on his atorvastatin and furosemide  Sent to [place] if possible  [phone]

## 2017-12-01 MED ORDER — FUROSEMIDE 40 MG PO TABS: 40.0000 mg | ORAL_TABLET | Freq: Every day | ORAL | 3 refills | Status: DC

## 2017-12-01 MED ORDER — ATORVASTATIN CALCIUM 40 MG PO TABS: 40.0000 mg | ORAL_TABLET | Freq: Every evening | ORAL | 3 refills | Status: DC

## 2017-12-01 NOTE — Telephone Encounter (Signed)
Prescription sent to pharmacy.

## 2017-12-19 ENCOUNTER — Telehealth: Payer: Self-pay | Admitting: Cardiovascular Disease

## 2017-12-19 NOTE — Telephone Encounter (Signed)
New Message  Pt c/o medication issue:  1. Name of Medication: Xarelto   2. How are you currently taking this medication (dosage and times per day)? 20mg  once a day   3. Are you having a reaction (difficulty breathing--STAT)? none  4. What is your medication issue? Patient states that on 11/16/2017 he fell and hurt the right side of his buttocks. Since then he no longer has the pain but the area is black/blue and continues to get bigger. He is wondering should he be concern or is this a result of the medication.

## 2017-12-19 NOTE — Telephone Encounter (Signed)
Patient stated after falling last Monday that he has some bruising on his bottom and is concerned. Patient stated the bruising is bigger now. Encouraged patient to go see his PCP and have his bottom looked at to make sure it's healing okay.Patient verbalized understanding.

## 2017-12-23 ENCOUNTER — Ambulatory Visit (INDEPENDENT_AMBULATORY_CARE_PROVIDER_SITE_OTHER): Payer: Medicare HMO | Admitting: Family Medicine

## 2017-12-23 ENCOUNTER — Other Ambulatory Visit: Payer: Self-pay

## 2017-12-23 ENCOUNTER — Encounter: Payer: Self-pay | Admitting: Family Medicine

## 2017-12-23 VITALS — BP 136/72 | HR 86 | Temp 98.1°F | Resp 16 | Ht 72.0 in | Wt 195.0 lb

## 2017-12-23 DIAGNOSIS — S300XXA Contusion of lower back and pelvis, initial encounter: Secondary | ICD-10-CM

## 2017-12-23 DIAGNOSIS — W19XXXA Unspecified fall, initial encounter: Secondary | ICD-10-CM | POA: Diagnosis not present

## 2017-12-23 NOTE — Progress Notes (Signed)
   Subjective:    Patient ID: [name] [name], male    DOB: 05-15-1941, 77 y.o.   MRN: [id number]  Patient presents for R Buttock pain (S/P fall 12/12/2017- on Xarelto 20mg - increased pain and bruising to lower buttock)  Jan 28th had a fall and while he was at the gym he was trying to hang up a coat and stumbled backwards and fell onto a machine he has had some bruising on his right buttocks since then.  States he initially felt a small knot but that is now gone.  He is on Xarelto 20 mg per his cardiologist.  He is able to walk up and down stairs without any difficulty sometimes he has some discomfort if he sits a certain way.  There is been no blood in his urine or stool.  He denies any back pain.  He has not had to take any medication he does want to have it looked at as he was not sure if the bruising was normal.   Review Of Systems:  GEN- denies fatigue, fever, weight loss,weakness, recent illness HEENT- denies eye drainage, change in vision, nasal discharge, CVS- denies chest pain, palpitations RESP- denies SOB, cough, wheeze ABD- denies N/V, change in stools, abd pain GU- denies dysuria, hematuria, dribbling, incontinence MSK- denies joint pain, muscle aches, injury Neuro- denies headache, dizziness, syncope, seizure activity       Objective:    BP 136/72   Pulse 86   Temp 98.1 F (36.7 C) (Oral)   Resp 16   Ht 6' (1.829 m)   Wt 195 lb (88.5 kg)   SpO2 98%   BMI 26.45 kg/m  GEN- NAD, alert and oriented x3 CVS- RRR, no murmur RESP-CTAB Skin- Right buttocks large ecchymopsis with some yellowing , 2 smaller spots on right upper post leg , no hematoma palpated  MSK- good ROM Spine, spine NT, fair ROM HIPS/KNEES no pain with ROM, neg hip rock, no TTP over coccyx , no TTP over HIP Pulses- Radial, DP- 2+        Assessment & Plan:      Problem List Items Addressed This Visit    None    Visit Diagnoses    Contusion of buttock, initial encounter    -  Primary   Contusion  s/p fall, good ROM of joints, doubt fracture, able to do regular activites. Bruising will take time to resolve, he can ice, but no appreciable, hematoma As minimal pain and per above, hold on any imaging    Accidental fall, initial encounter          Note: This dictation was prepared with Dragon dictation along with smaller phrase technology. Any transcriptional errors that result from this process are unintentional.

## 2017-12-23 NOTE — Patient Instructions (Signed)
Okay to use ICE  Okay to use TYlenol brand  F/U as previous

## 2018-02-10 ENCOUNTER — Telehealth: Payer: Self-pay | Admitting: Cardiovascular Disease

## 2018-02-10 NOTE — Telephone Encounter (Signed)
New Message:    Pt states he would like to get off the rivaroxaban (XARELTO) 20 MG TABS tablet. Pt did not state why but would like to be advised on it.

## 2018-02-13 NOTE — Telephone Encounter (Signed)
Patient reports he does not want to continue taking Xarelto. He states the biggest issue is that it is too expensive, but he also feels sluggish and cold since starting the medication. He also reports "terrible bruising" when he hits his skin on anything. He will continue taking Xarelto until he has further medication recommendations from Dr. Burt Knack.

## 2018-02-13 NOTE — Telephone Encounter (Signed)
CHADS-Vasc is 3. He should be anticoagulated. Could change to apixaban 5 mg BID and check cost. If he decides not to take anticoagulant drugs he should resume ASA 81 mg daily, but this won't give him the same protection from stroke as an oral anticoagulant.

## 2018-02-13 NOTE — Telephone Encounter (Signed)
New Message     Pt c/o medication issue:  1. Name of Medication: Xarelto 20mg   2. How are you currently taking this medication (dosage and times per day)?  1 tablet a day   3. Are you having a reaction (difficulty breathing--STAT)? no  4. What is your medication issue? Patient wants to stop taking it , he is bruising really easy and he feels cold all the time , and it is expensive

## 2018-02-15 NOTE — Telephone Encounter (Signed)
Left message to call back  

## 2018-02-15 NOTE — Telephone Encounter (Signed)
Offered patient to switch to Eliquis 5 mg BID.  He declines to stay on a DOAC, stating he will "take his chances with aspirin." He states he will finish his three month supply and then start ASA 81 mg.  Reminder set to update medication list in 3 months.

## 2018-03-02 ENCOUNTER — Ambulatory Visit: Payer: Medicare HMO | Admitting: Family Medicine

## 2018-04-21 ENCOUNTER — Telehealth: Payer: Self-pay | Admitting: Cardiovascular Disease

## 2018-04-21 MED ORDER — ASPIRIN EC 81 MG PO TBEC: 81.0000 mg | DELAYED_RELEASE_TABLET | Freq: Every day | ORAL | 3 refills | Status: DC

## 2018-04-21 NOTE — Telephone Encounter (Signed)
04-21-18 pt rtn call to [facility] [phone]

## 2018-04-21 NOTE — Telephone Encounter (Signed)
New Message   Pt c/o medication issue:  1. Name of Medication: rivaroxaban (XARELTO) 20 MG TABS tablet and aspirin  2. How are you currently taking this medication (dosage and times per day)? Take 1 tablet (20 mg total) by mouth daily with supper.  3. Are you having a reaction (difficulty breathing--STAT)? no  4. What is your medication issue? Pt states he was told to stop his Xarelto, which he will be doing on 6/21, and to call when he does. He was also told to start taking 81mg  or aspirin instead or the 325mg  he has been taking for 10 years. Please call

## 2018-04-21 NOTE — Telephone Encounter (Signed)
Per 3/29 phone note, "Sherren Mocha, MD  to Me    5:10 PM  Note    CHADS-Vasc is 3. He should be anticoagulated. Could change to apixaban 5 mg BID and check cost. If he decides not to take anticoagulant drugs he should resume ASA 81 mg daily, but this won't give him the same protection from stroke as an oral anticoagulant."       Patient does not wish to take a DOAC. Confirmed he has not been taking ASA while on Xarelto (he states he stopped when he started Xarelto). He says his last day of Xarelto will be 6/21 before he is out. He understands to start ASA 81 mg daily 6/22, not ASA 325 mg as historically taken.  He was grateful for call and agrees with treatment plan.  Note mailed to patient with instructions per request.   Med list updated.

## 2018-04-21 NOTE — Telephone Encounter (Signed)
Left message to call back  

## 2018-05-15 DIAGNOSIS — N2581 Secondary hyperparathyroidism of renal origin: Secondary | ICD-10-CM | POA: Diagnosis not present

## 2018-05-15 DIAGNOSIS — I2510 Atherosclerotic heart disease of native coronary artery without angina pectoris: Secondary | ICD-10-CM | POA: Diagnosis not present

## 2018-05-15 DIAGNOSIS — R339 Retention of urine, unspecified: Secondary | ICD-10-CM | POA: Diagnosis not present

## 2018-05-15 DIAGNOSIS — C669 Malignant neoplasm of unspecified ureter: Secondary | ICD-10-CM | POA: Diagnosis not present

## 2018-05-15 DIAGNOSIS — N189 Chronic kidney disease, unspecified: Secondary | ICD-10-CM | POA: Diagnosis not present

## 2018-05-15 DIAGNOSIS — I252 Old myocardial infarction: Secondary | ICD-10-CM | POA: Diagnosis not present

## 2018-05-15 DIAGNOSIS — E782 Mixed hyperlipidemia: Secondary | ICD-10-CM | POA: Diagnosis not present

## 2018-05-15 DIAGNOSIS — N183 Chronic kidney disease, stage 3 (moderate): Secondary | ICD-10-CM | POA: Diagnosis not present

## 2018-05-15 DIAGNOSIS — D631 Anemia in chronic kidney disease: Secondary | ICD-10-CM | POA: Diagnosis not present

## 2018-05-15 DIAGNOSIS — I129 Hypertensive chronic kidney disease with stage 1 through stage 4 chronic kidney disease, or unspecified chronic kidney disease: Secondary | ICD-10-CM | POA: Diagnosis not present

## 2018-06-13 ENCOUNTER — Encounter: Payer: Self-pay | Admitting: Family Medicine

## 2018-06-13 DIAGNOSIS — H524 Presbyopia: Secondary | ICD-10-CM | POA: Diagnosis not present

## 2018-06-20 DIAGNOSIS — H524 Presbyopia: Secondary | ICD-10-CM | POA: Diagnosis not present

## 2018-06-20 DIAGNOSIS — H52223 Regular astigmatism, bilateral: Secondary | ICD-10-CM | POA: Diagnosis not present

## 2018-08-11 DIAGNOSIS — M25561 Pain in right knee: Secondary | ICD-10-CM | POA: Diagnosis not present

## 2018-08-11 DIAGNOSIS — M170 Bilateral primary osteoarthritis of knee: Secondary | ICD-10-CM | POA: Diagnosis not present

## 2018-08-11 DIAGNOSIS — M25562 Pain in left knee: Secondary | ICD-10-CM | POA: Diagnosis not present

## 2018-08-16 ENCOUNTER — Telehealth: Payer: Self-pay

## 2018-08-16 NOTE — Telephone Encounter (Signed)
   [facility] Pre-operative Risk Assessment    Request for surgical clearance:  1. What type of surgery is being performed?  Left Total Knee   2. When is this surgery scheduled?  TBD   3. What type of clearance is required (medical clearance vs. Pharmacy clearance to hold med vs. Both)?  BOTH  4. Are there any medications that need to be held prior to surgery and how long? Anticoagulant 7 days   5. Practice name and name of physician performing surgery?  [facility]/ Dr [name]   6. What is your office phone number [phone]    7.   What is your office fax number [phone]  8.   Anesthesia type (None, local, MAC, general) ?    [name] [name]  [name] [name] 08/16/2018, 2:18 PM  _________________________________________________________________   (provider comments below)

## 2018-08-17 ENCOUNTER — Ambulatory Visit (INDEPENDENT_AMBULATORY_CARE_PROVIDER_SITE_OTHER): Payer: Medicare HMO | Admitting: Family Medicine

## 2018-08-17 ENCOUNTER — Encounter: Payer: Self-pay | Admitting: Family Medicine

## 2018-08-17 ENCOUNTER — Other Ambulatory Visit: Payer: Self-pay

## 2018-08-17 VITALS — BP 130/76 | HR 62 | Temp 98.3°F | Resp 16 | Ht 72.0 in | Wt 195.0 lb

## 2018-08-17 DIAGNOSIS — I10 Essential (primary) hypertension: Secondary | ICD-10-CM | POA: Diagnosis not present

## 2018-08-17 DIAGNOSIS — Z905 Acquired absence of kidney: Secondary | ICD-10-CM | POA: Diagnosis not present

## 2018-08-17 DIAGNOSIS — N183 Chronic kidney disease, stage 3 unspecified: Secondary | ICD-10-CM

## 2018-08-17 DIAGNOSIS — Z01818 Encounter for other preprocedural examination: Secondary | ICD-10-CM

## 2018-08-17 DIAGNOSIS — E782 Mixed hyperlipidemia: Secondary | ICD-10-CM

## 2018-08-17 DIAGNOSIS — R7309 Other abnormal glucose: Secondary | ICD-10-CM | POA: Diagnosis not present

## 2018-08-17 NOTE — Progress Notes (Signed)
Patient ID: [name] [name], male    DOB: 11/22/1940, 77 y.o.   MRN: [id number]  PCP: Alycia Rossetti, MD  Chief Complaint  Patient presents with  . Surgical Clearance    TKA- has cards appt on 10/4    Subjective:   [name] [name] is a 77 y.o. male, presents to clinic with CC of need for preop clearance for left total knee arthroplasty scheduled a few weeks from now with Dr. [name]. Preop clearance needed for left total knee arthroplasty, scheduled with Dr. [name] for September 05, 2018. Form received from [facility] for this states it will be with spinal anesthesia, PCP clearance with general physical, EKG, CBC, CMP, albumin, A1c and urinalysis and other indicated tests. Patient is seeing cardiology for preop clearance as well, they currently manage anticoagulation, A. fib, CAD heart failure so will defer to them for those components of preop clearance.  Per chart review they also have prescribed Lasix which she is currently taking daily, he complains since starting this he has urinary frequency all day and all night.  He has swelling in both of his knees from chronic knee pain and degeneration, and no swelling to his shins but he states he sometimes will swell on his ankles.  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 [name], CKD stage III, we will recheck kidney function today, urinalysis pending.  Other than urinary frequency secondary to Lasix he has no urinary complaints.   Last labs are from last well visit September 14, 2017 shows a GFR of 55, serum creatinine 1.25 BUN 22.  Labs prior to that September 2017 serum creatinine 1.38 and GFR 50, CKD appears to be stable over the past several years  Hypertension well-controlled, blood pressure at goal today, asymptomatic.   He takes only Lasix, nitroglycerin as needed,  supplements, statin and aspirin and is not on any blood pressure medications, diet controlled No history of lung disease No history of diabetes, no A1c noted in this chart, will obtain today  Most recent well visit was done about 1 year ago.  He has a Medicare well visit scheduled 1 month from now.   BP Readings from Last 3 Encounters:  08/17/18 130/76  12/23/17 136/72  10/03/17 136/70    Lipid/Cholesterol, Follow-up:   Last seen for this 1 years ago.  Management since that visit includes Lipitor, fish oil supplement, and inositol niacinate.  Is due for FLP, not fasting today, will obtain and upcoming [place].  Last Lipid Panel:    Component Value Date/Time   CHOL 139 09/14/2017 1004   TRIG 72 09/14/2017 1004   HDL 51 09/14/2017 1004   CHOLHDL 2.7 09/14/2017 1004   VLDL 23.0 01/27/2010 0937   LDLCALC 73 09/14/2017 1004    He reports excellent compliance with treatment. He is not having side effects.  No myalgias, abdominal pain, nausea, vomiting, scleral  icterus or jaundice  Wt Readings from Last 3 Encounters:  08/17/18 195 lb (88.5 kg)  12/23/17 195 lb (88.5 kg)  10/03/17 188 lb 12.8 oz (85.6 kg)    Patient is a former smoker, with 20-pack-year history, quit smoking 37 years ago, no history per chart review of any recurrent bronchitis or frequent symptoms suggestive of COPD.  Patient Active Problem List   Diagnosis Date Noted  . Elevated PSA 09/14/2017  .  Primary osteoarthritis of left knee 01/27/2017  . OA (osteoarthritis) of knee 01/26/2017  . Spinal stenosis of lumbar region 12/08/2016  . Reflux 11/26/2011  . Mixed hyperlipidemia 10/05/2008  . CAD, NATIVE VESSEL 10/05/2008  . Atrial fibrillation ([place]) 10/05/2008  . CKD (chronic kidney disease), stage III ([place]) 10/05/2008     Prior to Admission medications   Medication Sig Start Date End Date Taking? Authorizing Provider  aspirin EC 81 MG tablet Take 1 tablet (81 mg total) by mouth daily. 04/21/18 04/16/19 Yes  Sherren Mocha, MD  atorvastatin (LIPITOR) 40 MG tablet Take 1 tablet (40 mg total) by mouth every evening. 12/01/17  Yes [place], [name], MD  calcitRIOL (ROCALTROL) 0.5 MCG capsule Take 0.5 mcg by mouth daily.   Yes [provider]  furosemide (LASIX) 40 MG tablet Take 1 tablet (40 mg total) by mouth daily. 12/01/17  Yes [place], [name], MD  Inositol Niacinate (NIACIN FLUSH FREE) 500 MG CAPS Take 1 capsule by mouth daily.    Yes [provider]  NITROSTAT 0.4 MG SL tablet Place 1 tablet (0.4 mg total) under the tongue every 5 (five) minutes as needed for chest pain. 09/18/15  Yes Isaiah Serge, NP  Omega-3 Fatty Acids (FISH OIL) 1200 MG CAPS Take 1 capsule by mouth daily.    Yes [provider]  vitamin B-12 (CYANOCOBALAMIN) 500 MCG tablet Take 500 mcg by mouth daily.   Yes [provider]    Allergies  Allergen Reactions  . Contrast Media [Iodinated Diagnostic Agents]     Contrast-induced nephropathy requiring dialysis in 02/2007.  . Iodine-131 Other (See Comments)    Kidneys shut down  . Other Other (See Comments)    Beta blockers cause bradycardia       Review of Systems  Constitutional: Negative.  Negative for activity change, appetite change, chills, diaphoresis, fatigue, fever and unexpected weight change.  HENT: Negative.   Eyes: Negative.   Respiratory: Negative.  Negative for apnea, cough, choking, chest tightness, shortness of breath, wheezing and stridor.   Cardiovascular: Negative.  Negative for chest pain and palpitations.  Gastrointestinal: Negative.  Negative for abdominal pain and blood in stool.  Endocrine: Negative.   Genitourinary: Negative.  Negative for decreased urine volume, difficulty urinating, testicular pain and urgency.  Musculoskeletal: Positive for arthralgias. Negative for neck pain.  Skin: Negative.  Negative for color change and pallor.  Allergic/Immunologic: Negative.   Neurological: Negative.  Negative for  syncope, weakness, light-headedness and numbness.  Hematological: Negative.   Psychiatric/Behavioral: Negative.  Negative for confusion, dysphoric mood, self-injury and suicidal ideas. The patient is not nervous/anxious.   All other systems reviewed and are negative.      Objective:    Vitals:   08/17/18 1215  BP: 130/76  Pulse: 62  Resp: 16  Temp: 98.3 F (36.8 C)  TempSrc: Oral  SpO2: 99%  Weight: 195 lb (88.5 kg)  Height: 6' (1.829 m)    Body mass index is 26.45 kg/m.  Physical Exam  Constitutional: He is oriented to person, place, and time. He appears well-developed and well-nourished.  Non-toxic appearance. He does not appear ill. No distress.  Well-appearing elderly male, appears stated age, no acute distress  HENT:  Head: Normocephalic and atraumatic.  Right Ear: Tympanic membrane, external ear and ear canal normal.  Left Ear: Tympanic membrane, external ear and ear canal normal.  Nose: Nose normal. No mucosal edema or rhinorrhea. Right sinus exhibits no maxillary sinus tenderness and no frontal  sinus tenderness. Left sinus exhibits no maxillary sinus tenderness and no frontal sinus tenderness.  Mouth/Throat: Uvula is midline and oropharynx is clear and moist. No trismus in the jaw. No uvula swelling. No oropharyngeal exudate, posterior oropharyngeal edema or posterior oropharyngeal erythema.  Eyes: Pupils are equal, round, and reactive to light. Conjunctivae, EOM and lids are normal. Right eye exhibits no discharge. Left eye exhibits no discharge. No scleral icterus.  Neck: Trachea normal, normal range of motion and phonation normal. Neck supple. No tracheal deviation present. No thyromegaly present.  Cardiovascular: Normal rate, regular rhythm, normal heart sounds and normal pulses. Exam reveals no gallop and no friction rub.  No murmur heard. Pulses:      Radial pulses are 2+ on the right side, and 2+ on the left side.  Mild ankle edema, nonpitting on exam but when  socks are removed there is a small indentation No pretibial pitting edema bilaterally  Pulmonary/Chest: Effort normal and breath sounds normal. No stridor. No respiratory distress. He has no wheezes. He has no rhonchi. He has no rales.  Abdominal: Soft. Normal appearance and bowel sounds are normal. He exhibits no distension and no mass. There is no tenderness. There is no rebound and no guarding.  Musculoskeletal: Normal range of motion. He exhibits no edema.  Bilateral knees appear to have some chronic swelling, no erythema  Neurological: He is alert and oriented to person, place, and time. He exhibits normal muscle tone. Coordination and gait normal.  Skin: Skin is warm and intact. Capillary refill takes less than 2 seconds. No rash noted. He is not diaphoretic. No cyanosis or erythema. No pallor. Nails show no clubbing.  Psychiatric: He has a normal mood and affect. His speech is normal and behavior is normal. Judgment and thought content normal.  Nursing note and vitals reviewed.         Assessment & Plan:      ICD-10-CM   1. CKD (chronic kidney disease), stage III (HCC) A76.8 COMPLETE METABOLIC PANEL WITH GFR    CBC with Differential/Platelet  2. Hypertension, unspecified type T15 COMPLETE METABOLIC PANEL WITH GFR    CBC with Differential/Platelet  3. Preoperative clearance Z01.818 Hemoglobin A1c    Urinalysis, Routine w reflex microscopic    Preop clearance needed for left total knee arthroplasty, scheduled with Dr. [name] for September 05, 2018. Form received from [facility] for this states it will be with spinal anesthesia, PCP clearance with general physical, EKG, CBC, CMP, albumin, A1c and urinalysis and other indicated tests. Patient is seeing cardiology for preop clearance as well, they currently manage anticoagulation, A. fib, CAD heart failure so will defer to them for those components of preop clearance.  Patient has one kidney, CKD stage III we will recheck kidney  function today, urinalysis pending. Hypertension well-controlled, blood pressure at goal today, asymptomatic.  He has seen a urologist but no nephrology is involved in his care currently. No history of lung disease No history of diabetes - A1C  Most recent well visit was done about 1 year ago.  He has a Medicare well visit scheduled 1 month from now.     [name] Grana, PA-C 08/17/18 12:29 PM

## 2018-08-18 ENCOUNTER — Encounter: Payer: Self-pay | Admitting: Physician Assistant

## 2018-08-18 ENCOUNTER — Ambulatory Visit: Payer: Medicare HMO | Admitting: Physician Assistant

## 2018-08-18 VITALS — BP 134/82 | HR 67 | Ht 72.0 in | Wt 194.4 lb

## 2018-08-18 DIAGNOSIS — I4821 Permanent atrial fibrillation: Secondary | ICD-10-CM

## 2018-08-18 DIAGNOSIS — I2510 Atherosclerotic heart disease of native coronary artery without angina pectoris: Secondary | ICD-10-CM

## 2018-08-18 DIAGNOSIS — Z01810 Encounter for preprocedural cardiovascular examination: Secondary | ICD-10-CM | POA: Diagnosis not present

## 2018-08-18 DIAGNOSIS — E782 Mixed hyperlipidemia: Secondary | ICD-10-CM

## 2018-08-18 LAB — URINALYSIS, ROUTINE W REFLEX MICROSCOPIC
BILIRUBIN URINE: NEGATIVE
Glucose, UA: NEGATIVE
Hgb urine dipstick: NEGATIVE
KETONES UR: NEGATIVE
Leukocytes, UA: NEGATIVE
Nitrite: NEGATIVE
PH: 6 (ref 5.0–8.0)
Protein, ur: NEGATIVE
SPECIFIC GRAVITY, URINE: 1.021 (ref 1.001–1.03)

## 2018-08-18 LAB — CBC WITH DIFFERENTIAL/PLATELET
BASOS ABS: 59 {cells}/uL (ref 0–200)
BASOS PCT: 0.8 %
EOS ABS: 141 {cells}/uL (ref 15–500)
Eosinophils Relative: 1.9 %
HEMATOCRIT: 39.7 % (ref 38.5–50.0)
HEMOGLOBIN: 13.4 g/dL (ref 13.2–17.1)
LYMPHS ABS: 1191 {cells}/uL (ref 850–3900)
MCH: 30.9 pg (ref 27.0–33.0)
MCHC: 33.8 g/dL (ref 32.0–36.0)
MCV: 91.5 fL (ref 80.0–100.0)
MPV: 11 fL (ref 7.5–12.5)
Monocytes Relative: 10.4 %
NEUTROS ABS: 5239 {cells}/uL (ref 1500–7800)
Neutrophils Relative %: 70.8 %
Platelets: 174 10*3/uL (ref 140–400)
RBC: 4.34 10*6/uL (ref 4.20–5.80)
RDW: 12.7 % (ref 11.0–15.0)
Total Lymphocyte: 16.1 %
WBC: 7.4 10*3/uL (ref 3.8–10.8)
WBCMIX: 770 {cells}/uL (ref 200–950)

## 2018-08-18 LAB — COMPLETE METABOLIC PANEL WITH GFR
AG RATIO: 1.7 (calc) (ref 1.0–2.5)
ALKALINE PHOSPHATASE (APISO): 50 U/L (ref 40–115)
ALT: 11 U/L (ref 9–46)
AST: 22 U/L (ref 10–35)
Albumin: 4.3 g/dL (ref 3.6–5.1)
BUN / CREAT RATIO: 14 (calc) (ref 6–22)
BUN: 18 mg/dL (ref 7–25)
CALCIUM: 9.3 mg/dL (ref 8.6–10.3)
CHLORIDE: 102 mmol/L (ref 98–110)
CO2: 27 mmol/L (ref 20–32)
CREATININE: 1.27 mg/dL — AB (ref 0.70–1.18)
GFR, Est African American: 62 mL/min/{1.73_m2} (ref >=60)
GFR, Est Non African American: 54 mL/min/{1.73_m2} — ABNORMAL LOW (ref >=60)
GLOBULIN: 2.5 g/dL (ref 1.9–3.7)
Glucose, Bld: 88 mg/dL (ref 65–99)
Potassium: 4.7 mmol/L (ref 3.5–5.3)
SODIUM: 138 mmol/L (ref 135–146)
Total Bilirubin: 0.8 mg/dL (ref 0.2–1.2)
Total Protein: 6.8 g/dL (ref 6.1–8.1)

## 2018-08-18 LAB — HEMOGLOBIN A1C
HEMOGLOBIN A1C: 5.7 %{Hb} — AB (ref <5.7)
Mean Plasma Glucose: 117 (calc)
eAG (mmol/L): 6.5 (calc)

## 2018-08-18 NOTE — Progress Notes (Signed)
Cardiology Office Note    Date:  08/18/2018   ID:  [name] [name], DOB 04-18-1941, MRN [id number]  PCP:  [name] Rossetti, MD  Cardiologist:    Chief Complaint: Surgical clearance   History of Present Illness:   [name] [name] is a 77 y.o. male CAD, permanent atrial fibrillation, CKD stage III and HLD presents for surgical clearance for left total knee replacement.   He has been followed for coronary artery disease with initial presentation in 2008 when he had a non-ST elevation infarct. He is treated with drug-eluting stents in the proximal and mid LAD. His LV function is been normal. He's had no problems with angina over the years. He has not required repeat heart catheterization. The patient also has developed permanent atrial fibrillation and treated with Xarelto.  He had a mechanical fall in June 2019 >> had multiple bruises and did not like to.  Due to this and cost he stopped his Xarelto.   Here today for clearance.  He is not interested in restart anticoagulation.  Currently takes aspirin 81 mg.  He does exercise 3 times per week for 1 hour each time without any limitation. The patient denies nausea, vomiting, fever, chest pain, palpitations, shortness of breath, orthopnea, PND, dizziness, syncope, cough, congestion, abdominal pain, hematochezia, melena, lower extremity edema.  Past Medical History:  Diagnosis Date  . Arthritis    shoulders and ribs   . Atrial fibrillation ([place])    atrial fib/ LOV [name] Argue PA 04/11/12 EPIC, - CHEST X RAY, EKG 5/13 EPIC  . CAD (coronary artery disease)     s/p NSTEMI 04/08;  [place] 02/2007: Proximal LAD 75%, mid LAD 99%, proximal RCA 25%.  PCI:  Cypher DES to the proximal and mid LAD.  Last nuclear study 11/2011 (after a trip to the ED with CP): EF 58%, low risk study with small inferior wall infarct at the mid and basal level, no ischemia.  Last echo 01/2010: Mild LVH, EF 55-60%, mild AI, mild MR, severely dilated LA and RA  . Contrast dye induced  nephropathy    Hx ARF secondary to contrast nephropathy  . Dyslipidemia   . Epididymitis   . History of blood transfusion   . Myocardial infarction ([place]) 2008  . Pleurisy    2012  . Pneumonia    hx of   . 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 Spinal stenosis   . 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 [name]    Past Surgical History:  Procedure Laterality Date  . arm surgery     orif right elbow  . CARDIAC CATHETERIZATION  2008  . coronary stents     . CYSTECTOMY  07/15/08  . CYSTOSCOPY WITH BIOPSY  03/23/2012   Procedure: CYSTOSCOPY WITH BIOPSY;  Surgeon: [name] Gray, MD;  Location: [facility];  Service: Urology;  Laterality: Right;   BRUSH BIOPSY RIGHT URETERAL STENT  . CYSTOSCOPY WITH URETEROSCOPY  03/23/2012   Procedure: CYSTOSCOPY WITH URETEROSCOPY;  Surgeon: [name] Gray, MD;  Location: [facility];  Service: Urology;  Laterality: Right;    **OR Room #8 requested**  C-ARM   . CYSTOSCOPY/RETROGRADE/URETEROSCOPY  02/15/2012   Procedure: CYSTOSCOPY/RETROGRADE/URETEROSCOPY;  Surgeon: [name] Ben, MD;  Location: [facility];  Service: Urology;  Laterality: Right;  C-ARM  . URETER SURGERY      Current Medications: Prior to Admission medications   Medication Sig Start Date End Date Taking? Authorizing Provider  aspirin EC 81 MG tablet Take 1  tablet (81 mg total) by mouth daily. 04/21/18 04/16/19 Yes Sherren Mocha, MD  atorvastatin (LIPITOR) 40 MG tablet Take 1 tablet (40 mg total) by mouth every evening. 12/01/17  Yes [place], [name], MD  calcitRIOL (ROCALTROL) 0.5 MCG capsule Take 0.5 mcg by mouth daily.   Yes [provider]  furosemide (LASIX) 40 MG tablet Take 1 tablet (40 mg total) by mouth daily. 12/01/17  Yes , [name], MD  Inositol Niacinate (NIACIN FLUSH FREE) 500 MG CAPS Take 1 capsule by mouth daily.    Yes [provider]  NITROSTAT 0.4 MG SL tablet Place 1 tablet (0.4 mg total) under the tongue every 5 (five) minutes as needed for chest pain. 09/18/15  Yes [name] Serge, NP  Omega-3 Fatty Acids (FISH OIL) 1200 MG CAPS Take 1 capsule by mouth daily.    Yes [provider]  vitamin B-12 (CYANOCOBALAMIN) 500 MCG tablet Take 500 mcg by mouth daily.   Yes [provider]    Allergies:   Contrast media [iodinated diagnostic agents]; Iodine-131; and Other   Social History   Socioeconomic History  . Marital status: Married    Spouse name: [name] [name]  . Number of children: 3  . Years of education: 16  . Highest education level: Not on file  Occupational History  . Occupation: Retired  Scientific laboratory technician  . Financial resource strain: Not on file  . Food insecurity:    Worry: Not on file    Inability: Not on file  . Transportation needs:    Medical: Not on file    Non-medical: Not on file  Tobacco Use  . Smoking status: Former Smoker    Packs/day: 2.00    Years: 10.00    Pack years: 20.00    Types: Cigarettes, Cigars    Last attempt to quit: 11/15/1981    Years since quitting: 36.7  . Smokeless tobacco: Never Used  Substance and Sexual Activity  . Alcohol use: No  . Drug use: No  . Sexual activity: Not Currently  Lifestyle  . Physical activity:    Days per week: Not on file    Minutes per session: Not on file  . Stress: Not on file  Relationships  . Social connections:    Talks on phone: Not on file    Gets together: Not on file    Attends religious service: Not on file    Active member of club or organization: Not on file    Attends meetings of clubs or organizations: Not on file    Relationship status: Not on file  Other Topics Concern  . Not on file  Social History Narrative   Lives in [place], [place] with wife. Exercising 3-4x/week.      Family History:  The patient's family history includes Cancer in his mother; Coronary artery disease in his father; Diabetes type II in his mother; Heart attack in his father; Hyperlipidemia in his mother.   ROS:   Please see the history of present illness.    ROS All other systems  reviewed and are negative.   PHYSICAL EXAM:   VS:  BP 134/82   Pulse 67   Ht 6' (1.829 m)   Wt 88.2 kg   SpO2 98%   BMI 26.37 kg/m    GEN: Well nourished, well developed, in no acute distress  HEENT: normal  Neck: no JVD, carotid bruits, or masses Cardiac: IR Ir ; no murmurs, rubs, or gallops,no edema  Respiratory:  clear to auscultation bilaterally, normal work of breathing GI: soft, nontender, nondistended, + BS MS: no deformity or atrophy  Skin: warm and dry, no rash Neuro:  Alert and Oriented x 3, Strength and sensation are intact Psych: euthymic mood, full affect  Wt Readings from Last 3 Encounters:  08/18/18 88.2 kg  08/17/18 88.5 kg  12/23/17 88.5 kg      Studies/Labs Reviewed:   EKG:  EKG is ordered today.  The ekg ordered today demonstrates afib at rate of 67 bpm  Recent Labs: 08/17/2018: ALT 11; BUN 18; Creat 1.27; Hemoglobin 13.4; Platelets 174; Potassium 4.7; Sodium 138   Lipid Panel    Component Value Date/Time   CHOL 139 09/14/2017 1004   TRIG 72 09/14/2017 1004   HDL 51 09/14/2017 1004   CHOLHDL 2.7 09/14/2017 1004   VLDL 23.0 01/27/2010 0937   LDLCALC 73 09/14/2017 1004    Additional studies/ records that were reviewed today include:   As above    ASSESSMENT & PLAN:    1. CAD  No angina.  Continue aspirin and statin.  2.  Persistent atrial fibrillation -Rate control without any medication.  He has stopped his Xarelto June. CHADSVASCs score of 3.  He is not interested in anticoagulation.  He understands risk of stroke.  3.  Hyperlipidemia -Continue statin.  Followed by PCP.  4.  Surgical clearance -He is easily getting greater than 4 METS of activity per [facility] activity status index.  Given past medical history and based on ACC/AHA guidelines, Dreon Pineda would be at acceptable risk for the planned procedure without further cardiovascular testing.    Reviewed with Dr. Burt Knack, [facility] to hold ASA for 7 days prior to surgery if needed. He is not  on any anticoagulation therapy.   I will route this recommendation to the requesting party via Epic fax function and remove from pre-op pool.  Please call with questions.  [place], [place] 08/18/2018, 2:32 PM       Medication Adjustments/Labs and Tests Ordered: Current medicines are reviewed at length with the patient today.  Concerns regarding medicines are outlined above.  Medication changes, Labs and Tests ordered today are listed in the Patient Instructions below. Patient Instructions  Medication Instructions:   If you need a refill on your cardiac medications before your next appointment, please call your pharmacy.   Lab work: None ordered  If you have labs (blood work) drawn today and your tests are completely normal, you will receive your results only by: Marland Kitchen MyChart Message (if you have MyChart) OR . A paper copy in the mail If you have any lab test that is abnormal or we need to change your treatment, we will call you to review the results.  Testing/Procedures: None ordered  Follow-Up: At [facility], you and your health needs are our priority.  As part of our continuing mission to provide you with exceptional heart care, we have created designated Provider Care Teams.  These Care Teams include your primary Cardiologist (physician) and Advanced Practice Providers (APPs -  Physician Assistants and Nurse Practitioners) who all work together to provide you with the care you need, when you need it. You will need a follow up appointment in:  1 years.  Please call our office 2 months in advance to schedule this appointment. You may see Dr. Silva Bandy or one of the following Advanced Practice Providers on your designated Care Team: Richardson Dopp, PA-C [place], [place] . Daune Perch, NP  Any Other  Special Instructions Will Be Listed Below (If Applicable).       Jarrett Soho, [place]  08/18/2018 2:30 PM    [address], [place], [place]  [address] Phone: [phone]; Fax: [phone]

## 2018-08-18 NOTE — Telephone Encounter (Signed)
Surgical clearance request for left total knee surgery received by [facility] for medical and pharmacy clearance. Appears pt has a hx of PAF and was offered DOAC therapy given a CHA2DS2VASc=3 however the patient declined. He is being seen in the office today 08/18/18 for further medical clearance. Once this is complete, office note will be faxed to surgical team for review.   Kathyrn Drown NP-C [place] Pager: [phone]

## 2018-08-18 NOTE — Patient Instructions (Signed)
Medication Instructions:   If you need a refill on your cardiac medications before your next appointment, please call your pharmacy.   Lab work: None ordered  If you have labs (blood work) drawn today and your tests are completely normal, you will receive your results only by: Marland Kitchen MyChart Message (if you have MyChart) OR . A paper copy in the mail If you have any lab test that is abnormal or we need to change your treatment, we will call you to review the results.  Testing/Procedures: None ordered  Follow-Up: At [facility], you and your health needs are our priority.  As part of our continuing mission to provide you with exceptional heart care, we have created designated Provider Care Teams.  These Care Teams include your primary Cardiologist (physician) and Advanced Practice Providers (APPs -  Physician Assistants and Nurse Practitioners) who all work together to provide you with the care you need, when you need it. You will need a follow up appointment in:  1 years.  Please call our office 2 months in advance to schedule this appointment. You may see Dr. Silva Bandy or one of the following Advanced Practice Providers on your designated Care Team: Richardson Dopp, PA-C [place], [place] . Daune Perch, NP  Any Other Special Instructions Will Be Listed Below (If Applicable).

## 2018-08-21 ENCOUNTER — Encounter: Payer: Self-pay | Admitting: *Deleted

## 2018-08-22 ENCOUNTER — Encounter: Payer: Self-pay | Admitting: Family Medicine

## 2018-08-22 DIAGNOSIS — C4441 Basal cell carcinoma of skin of scalp and neck: Secondary | ICD-10-CM | POA: Diagnosis not present

## 2018-08-22 DIAGNOSIS — C44219 Basal cell carcinoma of skin of left ear and external auricular canal: Secondary | ICD-10-CM | POA: Diagnosis not present

## 2018-08-22 DIAGNOSIS — L821 Other seborrheic keratosis: Secondary | ICD-10-CM | POA: Diagnosis not present

## 2018-08-22 DIAGNOSIS — L812 Freckles: Secondary | ICD-10-CM | POA: Diagnosis not present

## 2018-08-22 DIAGNOSIS — D1801 Hemangioma of skin and subcutaneous tissue: Secondary | ICD-10-CM | POA: Diagnosis not present

## 2018-08-22 DIAGNOSIS — D485 Neoplasm of uncertain behavior of skin: Secondary | ICD-10-CM | POA: Diagnosis not present

## 2018-08-23 NOTE — Progress Notes (Signed)
Clearance Delsa Grana PA-C 08-21-18 pt. PCP on chart  Clearance Cardiology 08-18-18 epic  ekg 08-18-18 epic  Cbc/diff, cmp,hgba1c, ua from 08-17-18 epic  lov pcp 08-17-18 on chart

## 2018-08-23 NOTE — Patient Instructions (Addendum)
[name] [name]  08/23/2018   Your procedure is scheduled on: 09-05-18  Report to [facility] Main  Entrance  Report to admitting at     1101 AM    Call this number if you have problems the morning of surgery [phone]    Remember: Do not eat food:After Midnight  You may have clear liquids until 0800 am then nothing by mouth    CLEAR LIQUID DIET   Foods Allowed                                                                     Foods Excluded  Coffee and tea, regular and decaf                             liquids that you cannot  Plain Jell-O in any flavor                                             see through such as: Fruit ices (not with fruit pulp)                                     milk, soups, orange juice  Iced Popsicles                                    All solid food Carbonated beverages, regular and diet                                    Cranberry, grape and apple juices Sports drinks like Gatorade Lightly seasoned clear broth or consume(fat free) Sugar, honey syrup  _____________________________________________________________________    . BRUSH YOUR TEETH MORNING OF SURGERY AND RINSE YOUR MOUTH   OUT, NO CHEWING GUM CANDY OR MINTS.     Take these medicines the morning of surgery with A SIP OF WATER: NONE                                You may not have any metal on your body including hair pins and              piercings  Do not wear jewelry,lotions, powders or perfumes, deodorant                       Men may shave face and neck.   Do not bring valuables to the hospital. [place].  Contacts, dentures or bridgework may not be worn into surgery.  Leave suitcase in the car. After surgery it may be brought to your room.  Please read over the following fact sheets you were given: ____________________________________________________________________           [facility] - Preparing for Surgery Before surgery, you can play an important role.  Because skin is not sterile, your skin needs to be as free of germs as possible.  You can reduce the number of germs on your skin by washing with CHG (chlorahexidine gluconate) soap before surgery.  CHG is an antiseptic cleaner which kills germs and bonds with the skin to continue killing germs even after washing. Please DO NOT use if you have an allergy to CHG or antibacterial soaps.  If your skin becomes reddened/irritated stop using the CHG and inform your nurse when you arrive at Short Stay. Do not shave (including legs and underarms) for at least 48 hours prior to the first CHG shower.  You may shave your face/neck. Please follow these instructions carefully:  1.  Shower with CHG Soap the night before surgery and the  morning of Surgery.  2.  If you choose to wash your hair, wash your hair first as usual with your  normal  shampoo.  3.  After you shampoo, rinse your hair and body thoroughly to remove the  shampoo.                           4.  Use CHG as you would any other liquid soap.  You can apply chg directly  to the skin and wash                       Gently with a scrungie or clean washcloth.  5.  Apply the CHG Soap to your body ONLY FROM THE NECK DOWN.   Do not use on face/ open                           Wound or open sores. Avoid contact with eyes, ears mouth and genitals (private parts).                       Wash face,  Genitals (private parts) with your normal soap.             6.  Wash thoroughly, paying special attention to the area where your surgery  will be performed.  7.  Thoroughly rinse your body with warm water from the neck down.  8.  DO NOT shower/wash with your normal soap after using and rinsing off  the CHG Soap.                9.  Pat yourself dry with a clean towel.            10.  Wear clean pajamas.            11.  Place clean sheets on your bed the night of your first shower and do not   sleep with pets. Day of Surgery : Do not apply any lotions/deodorants the morning of surgery.  Please wear clean clothes to the hospital/surgery center.  FAILURE TO FOLLOW THESE INSTRUCTIONS MAY RESULT IN THE CANCELLATION OF YOUR SURGERY PATIENT SIGNATURE_________________________________  NURSE SIGNATURE__________________________________  ________________________________________________________________________  WHAT IS A BLOOD TRANSFUSION? Blood Transfusion Information  A transfusion is the replacement of blood or some of its parts. Blood is made up of multiple cells which provide different functions.  Red blood  cells carry oxygen and are used for blood loss replacement.  White blood cells fight against infection.  Platelets control bleeding.  Plasma helps clot blood.  Other blood products are available for specialized needs, such as hemophilia or other clotting disorders. BEFORE THE TRANSFUSION  Who gives blood for transfusions?   Healthy volunteers who are fully evaluated to make sure their blood is safe. This is blood bank blood. Transfusion therapy is the safest it has ever been in the practice of medicine. Before blood is taken from a donor, a complete history is taken to make sure that person has no history of diseases nor engages in risky social behavior (examples are intravenous drug use or sexual activity with multiple partners). The donor's travel history is screened to minimize risk of transmitting infections, such as malaria. The donated blood is tested for signs of infectious diseases, such as HIV and hepatitis. The blood is then tested to be sure it is compatible with you in order to minimize the chance of a transfusion reaction. If you or a relative donates blood, this is often done in anticipation of surgery and is not appropriate for emergency situations. It takes many days to process the donated blood. RISKS AND COMPLICATIONS Although transfusion therapy is very safe  and saves many lives, the main dangers of transfusion include:   Getting an infectious disease.  Developing a transfusion reaction. This is an allergic reaction to something in the blood you were given. Every precaution is taken to prevent this. The decision to have a blood transfusion has been considered carefully by your caregiver before blood is given. Blood is not given unless the benefits outweigh the risks. AFTER THE TRANSFUSION  Right after receiving a blood transfusion, you will usually feel much better and more energetic. This is especially true if your red blood cells have gotten low (anemic). The transfusion raises the level of the red blood cells which carry oxygen, and this usually causes an energy increase.  The nurse administering the transfusion will monitor you carefully for complications. HOME CARE INSTRUCTIONS  No special instructions are needed after a transfusion. You may find your energy is better. Speak with your caregiver about any limitations on activity for underlying diseases you may have. SEEK MEDICAL CARE IF:   Your condition is not improving after your transfusion.  You develop redness or irritation at the intravenous (IV) site. SEEK IMMEDIATE MEDICAL CARE IF:  Any of the following symptoms occur over the next 12 hours:  Shaking chills.  You have a temperature by mouth above 102 F (38.9 C), not controlled by medicine.  Chest, back, or muscle pain.  People around you feel you are not acting correctly or are confused.  Shortness of breath or difficulty breathing.  Dizziness and fainting.  You get a rash or develop hives.  You have a decrease in urine output.  Your urine turns a dark color or changes to pink, red, or brown. Any of the following symptoms occur over the next 10 days:  You have a temperature by mouth above 102 F (38.9 C), not controlled by medicine.  Shortness of breath.  Weakness after normal activity.  The white part of the  eye turns yellow (jaundice).  You have a decrease in the amount of urine or are urinating less often.  Your urine turns a dark color or changes to pink, red, or brown. Document Released: 10/29/2000 Document Revised: 01/24/2012 Document Reviewed: 06/17/2008 [facility] Patient Information 2014 [place], [place].  _______________________________________________________________________  Incentive Spirometer  An incentive spirometer is a tool that can help keep your lungs clear and active. This tool measures how well you are filling your lungs with each breath. Taking long deep breaths may help reverse or decrease the chance of developing breathing (pulmonary) problems (especially infection) following:  A long period of time when you are unable to move or be active. BEFORE THE PROCEDURE   If the spirometer includes an indicator to show your best effort, your nurse or respiratory therapist will set it to a desired goal.  If possible, sit up straight or lean slightly forward. Try not to slouch.  Hold the incentive spirometer in an upright position. INSTRUCTIONS FOR USE  1. Sit on the edge of your bed if possible, or sit up as far as you can in bed or on a chair. 2. Hold the incentive spirometer in an upright position. 3. Breathe out normally. 4. Place the mouthpiece in your mouth and seal your lips tightly around it. 5. Breathe in slowly and as deeply as possible, raising the piston or the ball toward the top of the column. 6. Hold your breath for 3-5 seconds or for as long as possible. Allow the piston or ball to fall to the bottom of the column. 7. Remove the mouthpiece from your mouth and breathe out normally. 8. Rest for a few seconds and repeat Steps 1 through 7 at least 10 times every 1-2 hours when you are awake. Take your time and take a few normal breaths between deep breaths. 9. The spirometer may include an indicator to show your best effort. Use the indicator as a goal to work toward  during each repetition. 10. After each set of 10 deep breaths, practice coughing to be sure your lungs are clear. If you have an incision (the cut made at the time of surgery), support your incision when coughing by placing a pillow or rolled up towels firmly against it. Once you are able to get out of bed, walk around indoors and cough well. You may stop using the incentive spirometer when instructed by your caregiver.  RISKS AND COMPLICATIONS  Take your time so you do not get dizzy or light-headed.  If you are in pain, you may need to take or ask for pain medication before doing incentive spirometry. It is harder to take a deep breath if you are having pain. AFTER USE  Rest and breathe slowly and easily.  It can be helpful to keep track of a log of your progress. Your caregiver can provide you with a simple table to help with this. If you are using the spirometer at home, follow these instructions: [place] IF:   You are having difficultly using the spirometer.  You have trouble using the spirometer as often as instructed.  Your pain medication is not giving enough relief while using the spirometer.  You develop fever of 100.5 F (38.1 C) or higher. SEEK IMMEDIATE MEDICAL CARE IF:   You cough up bloody sputum that had not been present before.  You develop fever of 102 F (38.9 C) or greater.  You develop worsening pain at or near the incision site. MAKE SURE YOU:   Understand these instructions.  Will watch your condition.  Will get help right away if you are not doing well or get worse. Document Released: 03/14/2007 Document Revised: 01/24/2012 Document Reviewed: 05/15/2007 [facility] Patient Information 2014 [place], [place].   ________________________________________________________________________

## 2018-08-24 NOTE — H&P (Signed)
TOTAL KNEE ADMISSION H&P  Patient is being admitted for left total knee arthroplasty.  Subjective:  Chief Complaint:   Left knee primary OA / pain  HPI: [name] [name], 77 y.o. male, has a history of pain and functional disability in the left knee due to arthritis and has failed non-surgical conservative treatments for greater than 12 weeks to include NSAID's and/or analgesics, corticosteriod injections and activity modification.  Onset of symptoms was gradual, starting 1+ years ago with gradually worsening course since that time. The patient noted no past surgery on the left knee(s).  Patient currently rates pain in the left knee(s) at 10 out of 10 with activity. Patient has worsening of pain with activity and weight bearing, pain that interferes with activities of daily living, pain with passive range of motion, crepitus and joint swelling.  Patient has evidence of periarticular osteophytes and joint space narrowing by imaging studies. There is no active infection.   Risks, benefits and expectations were discussed with the patient.  Risks including but not limited to the risk of anesthesia, blood clots, nerve damage, blood vessel damage, failure of the prosthesis, infection and up to and including death.  Patient understand the risks, benefits and expectations and wishes to proceed with surgery.   PCP: Alycia Rossetti, MD  D/C Plans:       Home   Post-op Meds:       No Rx given   Tranexamic Acid:      To be given - IV   Decadron:      Is to be given  FYI:      ASA  Norco  DME:   Rx given for - 3-n-1  PT:   Rx given for    Patient Active Problem List   Diagnosis Date Noted  . 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 Elevated PSA 09/14/2017  . Primary osteoarthritis of left knee 01/27/2017  . OA (osteoarthritis) of knee 01/26/2017  . Spinal stenosis of lumbar region 12/08/2016  . Reflux 11/26/2011  . Mixed hyperlipidemia 10/05/2008  . CAD, NATIVE VESSEL 10/05/2008  . Atrial  fibrillation ([place]) 10/05/2008  . CKD (chronic kidney disease), stage III ([place]) 10/05/2008   Past Medical History:  Diagnosis Date  . Arthritis    shoulders and ribs   . Atrial fibrillation ([place])    atrial fib/ LOV [name] PA 04/11/12 EPIC, - CHEST X RAY, EKG 5/13 EPIC  . CAD (coronary artery disease)     s/p NSTEMI 04/08;  [place] 02/2007: Proximal LAD 75%, mid LAD 99%, proximal RCA 25%.  PCI:  Cypher DES to the proximal and mid LAD.  Last nuclear study 11/2011 (after a trip to the ED with CP): EF 58%, low risk study with small inferior wall infarct at the mid and basal level, no ischemia.  Last echo 01/2010: Mild LVH, EF 55-60%, mild AI, mild MR, severely dilated LA and RA  . Contrast dye induced nephropathy    Hx ARF secondary to contrast nephropathy  . Dyslipidemia   . Epididymitis   . History of blood transfusion   . Myocardial infarction ([place]) 2008  . Pleurisy    2012  . Pneumonia    hx of   . 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 Spinal stenosis   . 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 [name]    Past Surgical History:  Procedure Laterality Date  . arm surgery     orif right elbow  . CARDIAC  CATHETERIZATION  2008  . coronary stents     . CYSTECTOMY  07/15/08  . CYSTOSCOPY WITH BIOPSY  03/23/2012   Procedure: CYSTOSCOPY WITH BIOPSY;  Surgeon: [name], MD;  Location: [facility] ORS;  Service: Urology;  Laterality: Right;   BRUSH BIOPSY RIGHT URETERAL STENT  . CYSTOSCOPY WITH URETEROSCOPY  03/23/2012   Procedure: CYSTOSCOPY WITH URETEROSCOPY;  Surgeon: [name], MD;  Location: [facility] ORS;  Service: Urology;  Laterality: Right;    **OR Room #8 requested**  C-ARM   . CYSTOSCOPY/RETROGRADE/URETEROSCOPY  02/15/2012   Procedure: CYSTOSCOPY/RETROGRADE/URETEROSCOPY;  Surgeon: [name], MD;  Location: [facility] ORS;  Service: Urology;  Laterality: Right;  C-ARM  . URETER SURGERY      No current facility-administered medications for this encounter.    Current Outpatient Medications  Medication Sig Dispense  Refill Last Dose  . acetaminophen (TYLENOL) 650 MG CR tablet Take 1,300 mg by mouth every 8 (eight) hours as needed for pain.     Marland Kitchen aspirin EC 81 MG tablet Take 1 tablet (81 mg total) by mouth daily. 90 tablet 3 Taking  . atorvastatin (LIPITOR) 40 MG tablet Take 1 tablet (40 mg total) by mouth every evening. 90 tablet 3 Taking  . calcitRIOL (ROCALTROL) 0.5 MCG capsule Take 0.5 mcg by mouth daily.   Taking  . furosemide (LASIX) 40 MG tablet Take 1 tablet (40 mg total) by mouth daily. 90 tablet 3 Taking  . Inositol Niacinate (NIACIN FLUSH FREE) 500 MG CAPS Take 500 mg by mouth daily.    Taking  . NITROSTAT 0.4 MG SL tablet Place 1 tablet (0.4 mg total) under the tongue every 5 (five) minutes as needed for chest pain. 25 tablet 4 Taking  . Omega-3 Fatty Acids (FISH OIL) 1200 MG CAPS Take 1,200 mg by mouth daily.    Taking  . vitamin B-12 (CYANOCOBALAMIN) 500 MCG tablet Take 500 mcg by mouth daily.   Taking   Allergies  Allergen Reactions  . Contrast Media [Iodinated Diagnostic Agents]     Contrast-induced nephropathy requiring dialysis in 02/2007.  . Iodine-131 Other (See Comments)    Kidneys shut down  . Other Other (See Comments)    Beta blockers cause bradycardia    Social History   Tobacco Use  . Smoking status: Former Smoker    Packs/day: 2.00    Years: 10.00    Pack years: 20.00    Types: Cigarettes, Cigars    Last attempt to quit: 11/15/1981    Years since quitting: 36.7  . Smokeless tobacco: Never Used  Substance Use Topics  . Alcohol use: No    Family History  Problem Relation Age of Onset  . Cancer Mother   . Diabetes type II Mother   . Hyperlipidemia Mother   . Coronary artery disease Father   . Heart attack Father      Review of Systems  Constitutional: Negative.   HENT: Negative.   Eyes: Negative.   Respiratory: Negative.   Cardiovascular: Negative.   Gastrointestinal: Positive for heartburn.  Genitourinary: Positive for frequency.  Musculoskeletal: Positive  for joint pain.  Skin: Negative.   Neurological: Negative.   Endo/Heme/Allergies: Negative.   Psychiatric/Behavioral: Negative.     Objective:  Physical Exam  Constitutional: He is oriented to person, place, and time. He appears well-developed.  HENT:  Head: Normocephalic.  Eyes: Pupils are equal, round, and reactive to light.  Neck: Neck supple. No JVD present. No tracheal deviation present. No thyromegaly present.  Cardiovascular: Normal rate  and intact distal pulses.  Respiratory: Effort normal and breath sounds normal. No respiratory distress. He has no wheezes.  GI: Soft. There is no tenderness. There is no guarding.  Musculoskeletal:       Left knee: He exhibits decreased range of motion, swelling and bony tenderness. He exhibits no ecchymosis, no deformity, no laceration and no erythema. Tenderness found.  Lymphadenopathy:    He has no cervical adenopathy.  Neurological: He is alert and oriented to person, place, and time. A sensory deficit (occassional numbness in feet) is present.  Skin: Skin is warm and dry.  Psychiatric: He has a normal mood and affect.     Labs:  Estimated body mass index is 26.37 kg/m as calculated from the following:   Height as of 08/18/18: 6' (1.829 m).   Weight as of 08/18/18: 88.2 kg.   Imaging Review Plain radiographs demonstrate severe degenerative joint disease of the left knee(s). The overall alignment isneutral. The bone quality appears to be good for age and reported activity level.   Preoperative templating of the joint replacement has been completed, documented, and submitted to the Operating Room personnel in order to optimize intra-operative equipment management.    Patient's anticipated LOS is less than 2 midnights, meeting these requirements: - Younger than 52 - Lives within 1 hour of care - Has a competent adult at home to recover with post-op recover - NO history of  - Chronic pain requiring opiods  - Diabetes  -  Coronary Artery Disease  - Heart failure  - Heart attack  - Stroke  - DVT/VTE  - Cardiac arrhythmia  - Respiratory Failure/COPD  - Renal failure  - Anemia  - Advanced Liver disease     Assessment/Plan:  End stage arthritis, left knee   The patient history, physical examination, clinical judgment of the provider and imaging studies are consistent with end stage degenerative joint disease of the left knee(s) and total knee arthroplasty is deemed medically necessary. The treatment options including medical management, injection therapy arthroscopy and arthroplasty were discussed at length. The risks and benefits of total knee arthroplasty were presented and reviewed. The risks due to aseptic loosening, infection, stiffness, patella tracking problems, thromboembolic complications and other imponderables were discussed. The patient acknowledged the explanation, agreed to proceed with the plan and consent was signed. Patient is being admitted for inpatient treatment for surgery, pain control, PT, OT, prophylactic antibiotics, VTE prophylaxis, progressive ambulation and ADL's and discharge planning. The patient is planning to be discharged home.       [place] Derrika Ruffalo   PA-C  08/24/2018, 2:30 PM

## 2018-08-29 ENCOUNTER — Other Ambulatory Visit: Payer: Self-pay

## 2018-08-29 ENCOUNTER — Encounter (HOSPITAL_COMMUNITY)
Admission: RE | Admit: 2018-08-29 | Discharge: 2018-08-29 | Disposition: A | Discharge status: Home / Self Care | Payer: Medicare HMO | Source: Ambulatory Visit | Attending: Orthopedic Surgery | Admitting: Orthopedic Surgery

## 2018-08-29 ENCOUNTER — Encounter (HOSPITAL_COMMUNITY): Payer: Self-pay

## 2018-08-29 DIAGNOSIS — Z01818 Encounter for other preprocedural examination: Secondary | ICD-10-CM | POA: Diagnosis not present

## 2018-08-29 DIAGNOSIS — M1712 Unilateral primary osteoarthritis, left knee: Secondary | ICD-10-CM | POA: Insufficient documentation

## 2018-08-29 LAB — SURGICAL PCR SCREEN
MRSA, PCR: NEGATIVE
Staphylococcus aureus: NEGATIVE

## 2018-09-05 ENCOUNTER — Other Ambulatory Visit: Payer: Self-pay

## 2018-09-05 ENCOUNTER — Inpatient Hospital Stay (HOSPITAL_COMMUNITY)
Admission: RE | Admit: 2018-09-05 | Discharge: 2018-09-06 | DRG: 470 | Disposition: A | Discharge status: Home / Self Care | Payer: Medicare HMO | Attending: Orthopedic Surgery | Admitting: Orthopedic Surgery

## 2018-09-05 ENCOUNTER — Inpatient Hospital Stay (HOSPITAL_COMMUNITY): Payer: Medicare HMO | Admitting: Anesthesiology

## 2018-09-05 ENCOUNTER — Encounter (HOSPITAL_COMMUNITY)
Admission: RE | Disposition: A | Discharge status: Home / Self Care | Payer: Self-pay | Source: Home / Self Care | Attending: Orthopedic Surgery

## 2018-09-05 ENCOUNTER — Encounter (HOSPITAL_COMMUNITY): Payer: Self-pay | Admitting: *Deleted

## 2018-09-05 DIAGNOSIS — Z833 Family history of diabetes mellitus: Secondary | ICD-10-CM

## 2018-09-05 DIAGNOSIS — M48061 Spinal stenosis, lumbar region without neurogenic claudication: Secondary | ICD-10-CM | POA: Diagnosis not present

## 2018-09-05 DIAGNOSIS — I4891 Unspecified atrial fibrillation: Secondary | ICD-10-CM | POA: Diagnosis present

## 2018-09-05 DIAGNOSIS — E782 Mixed hyperlipidemia: Secondary | ICD-10-CM | POA: Diagnosis not present

## 2018-09-05 DIAGNOSIS — E663 Overweight: Secondary | ICD-10-CM | POA: Diagnosis present

## 2018-09-05 DIAGNOSIS — Z7982 Long term (current) use of aspirin: Secondary | ICD-10-CM | POA: Diagnosis not present

## 2018-09-05 DIAGNOSIS — E1122 Type 2 diabetes mellitus with diabetic chronic kidney disease: Secondary | ICD-10-CM | POA: Diagnosis present

## 2018-09-05 DIAGNOSIS — I10 Essential (primary) hypertension: Secondary | ICD-10-CM | POA: Diagnosis not present

## 2018-09-05 DIAGNOSIS — Z6826 Body mass index (BMI) 26.0-26.9, adult: Secondary | ICD-10-CM | POA: Diagnosis not present

## 2018-09-05 DIAGNOSIS — I080 Rheumatic disorders of both mitral and aortic valves: Secondary | ICD-10-CM | POA: Diagnosis present

## 2018-09-05 DIAGNOSIS — Z955 Presence of coronary angioplasty implant and graft: Secondary | ICD-10-CM

## 2018-09-05 DIAGNOSIS — G8918 Other acute postprocedural pain: Secondary | ICD-10-CM | POA: Diagnosis not present

## 2018-09-05 DIAGNOSIS — R972 Elevated prostate specific antigen [PSA]: Secondary | ICD-10-CM | POA: Diagnosis present

## 2018-09-05 DIAGNOSIS — Z87891 Personal history of nicotine dependence: Secondary | ICD-10-CM | POA: Diagnosis not present

## 2018-09-05 DIAGNOSIS — Z888 Allergy status to other drugs, medicaments and biological substances status: Secondary | ICD-10-CM

## 2018-09-05 DIAGNOSIS — I2510 Atherosclerotic heart disease of native coronary artery without angina pectoris: Secondary | ICD-10-CM | POA: Diagnosis not present

## 2018-09-05 DIAGNOSIS — M1712 Unilateral primary osteoarthritis, left knee: Secondary | ICD-10-CM | POA: Diagnosis not present

## 2018-09-05 DIAGNOSIS — K219 Gastro-esophageal reflux disease without esophagitis: Secondary | ICD-10-CM | POA: Diagnosis present

## 2018-09-05 DIAGNOSIS — Z905 Acquired absence of kidney: Secondary | ICD-10-CM | POA: Diagnosis not present

## 2018-09-05 DIAGNOSIS — Z79899 Other long term (current) drug therapy: Secondary | ICD-10-CM

## 2018-09-05 DIAGNOSIS — Z8349 Family history of other endocrine, nutritional and metabolic diseases: Secondary | ICD-10-CM

## 2018-09-05 DIAGNOSIS — Z96652 Presence of left artificial knee joint: Secondary | ICD-10-CM

## 2018-09-05 DIAGNOSIS — Z8249 Family history of ischemic heart disease and other diseases of the circulatory system: Secondary | ICD-10-CM

## 2018-09-05 DIAGNOSIS — N183 Chronic kidney disease, stage 3 (moderate): Secondary | ICD-10-CM | POA: Diagnosis present

## 2018-09-05 DIAGNOSIS — I252 Old myocardial infarction: Secondary | ICD-10-CM

## 2018-09-05 DIAGNOSIS — Q600 Renal agenesis, unilateral: Secondary | ICD-10-CM

## 2018-09-05 DIAGNOSIS — Z91041 Radiographic dye allergy status: Secondary | ICD-10-CM | POA: Diagnosis not present

## 2018-09-05 DIAGNOSIS — Z96659 Presence of unspecified artificial knee joint: Secondary | ICD-10-CM

## 2018-09-05 HISTORY — PX: TOTAL KNEE ARTHROPLASTY: SHX125

## 2018-09-05 LAB — TYPE AND SCREEN
ABO/RH(D): B POS
ANTIBODY SCREEN: NEGATIVE

## 2018-09-05 SURGERY — ARTHROPLASTY, KNEE, TOTAL
Anesthesia: Spinal | Site: Knee | Laterality: Left

## 2018-09-05 MED ORDER — LIDOCAINE 2% (20 MG/ML) 5 ML SYRINGE
INTRAMUSCULAR | Status: DC | PRN
  Administered 2018-09-05: 60 mg via INTRAVENOUS

## 2018-09-05 MED ORDER — ASPIRIN 81 MG PO CHEW
81.0000 mg | CHEWABLE_TABLET | Freq: Two times a day (BID) | ORAL | Status: DC
  Administered 2018-09-05 – 2018-09-06 (×2): 81 mg via ORAL
  Filled 2018-09-05 (×2): qty 1

## 2018-09-05 MED ORDER — LIDOCAINE 2% (20 MG/ML) 5 ML SYRINGE
INTRAMUSCULAR | Status: AC
  Filled 2018-09-05: qty 5

## 2018-09-05 MED ORDER — DOCUSATE SODIUM 100 MG PO CAPS: 100.0000 mg | ORAL_CAPSULE | Freq: Two times a day (BID) | ORAL | 0 refills | Status: DC

## 2018-09-05 MED ORDER — DEXAMETHASONE SODIUM PHOSPHATE 10 MG/ML IJ SOLN: 10.0000 mg | Freq: Once | INTRAMUSCULAR | Status: DC

## 2018-09-05 MED ORDER — METOCLOPRAMIDE HCL 5 MG PO TABS: 5.0000 mg | ORAL_TABLET | Freq: Three times a day (TID) | ORAL | Status: DC | PRN

## 2018-09-05 MED ORDER — ATORVASTATIN CALCIUM 40 MG PO TABS
40.0000 mg | ORAL_TABLET | Freq: Every evening | ORAL | Status: DC
  Administered 2018-09-05: 40 mg via ORAL
  Filled 2018-09-05: qty 1

## 2018-09-05 MED ORDER — TRANEXAMIC ACID-NACL 1000-0.7 MG/100ML-% IV SOLN
1000.0000 mg | Freq: Once | INTRAVENOUS | Status: AC
  Administered 2018-09-05: 1000 mg via INTRAVENOUS
  Filled 2018-09-05: qty 100

## 2018-09-05 MED ORDER — MAGNESIUM CITRATE PO SOLN: 1.0000 | Freq: Once | ORAL | Status: DC | PRN

## 2018-09-05 MED ORDER — ONDANSETRON HCL 4 MG/2ML IJ SOLN: 4.0000 mg | Freq: Four times a day (QID) | INTRAMUSCULAR | Status: DC | PRN

## 2018-09-05 MED ORDER — HYDROCODONE-ACETAMINOPHEN 7.5-325 MG PO TABS
1.0000 | ORAL_TABLET | ORAL | Status: DC | PRN
  Administered 2018-09-06: 2 via ORAL
  Filled 2018-09-05: qty 2

## 2018-09-05 MED ORDER — LACTATED RINGERS IV SOLN
INTRAVENOUS | Status: DC
  Administered 2018-09-05 (×2): via INTRAVENOUS

## 2018-09-05 MED ORDER — ONDANSETRON HCL 4 MG/2ML IJ SOLN
INTRAMUSCULAR | Status: AC
  Filled 2018-09-05: qty 2

## 2018-09-05 MED ORDER — TRANEXAMIC ACID-NACL 1000-0.7 MG/100ML-% IV SOLN
1000.0000 mg | INTRAVENOUS | Status: AC
  Administered 2018-09-05: 1000 mg via INTRAVENOUS
  Filled 2018-09-05: qty 100

## 2018-09-05 MED ORDER — BISACODYL 10 MG RE SUPP: 10.0000 mg | Freq: Every day | RECTAL | Status: DC | PRN

## 2018-09-05 MED ORDER — CHLORHEXIDINE GLUCONATE 4 % EX LIQD: 60.0000 mL | Freq: Once | CUTANEOUS | Status: DC

## 2018-09-05 MED ORDER — ONDANSETRON HCL 4 MG PO TABS: 4.0000 mg | ORAL_TABLET | Freq: Four times a day (QID) | ORAL | Status: DC | PRN

## 2018-09-05 MED ORDER — HYDROMORPHONE HCL 1 MG/ML IJ SOLN: 0.5000 mg | INTRAMUSCULAR | Status: DC | PRN

## 2018-09-05 MED ORDER — PHENYLEPHRINE 40 MCG/ML (10ML) SYRINGE FOR IV PUSH (FOR BLOOD PRESSURE SUPPORT)
PREFILLED_SYRINGE | INTRAVENOUS | Status: AC
  Filled 2018-09-05: qty 30

## 2018-09-05 MED ORDER — SODIUM CHLORIDE 0.9 % IV SOLN
INTRAVENOUS | Status: DC
  Administered 2018-09-05: 18:00:00 via INTRAVENOUS

## 2018-09-05 MED ORDER — HYDROCODONE-ACETAMINOPHEN 5-325 MG PO TABS
1.0000 | ORAL_TABLET | ORAL | Status: DC | PRN
  Administered 2018-09-05: 2 via ORAL
  Administered 2018-09-06 (×2): 1 via ORAL
  Filled 2018-09-05 (×2): qty 2
  Filled 2018-09-05: qty 1

## 2018-09-05 MED ORDER — FERROUS SULFATE 325 (65 FE) MG PO TABS: 325.0000 mg | ORAL_TABLET | Freq: Three times a day (TID) | ORAL | 3 refills | Status: DC

## 2018-09-05 MED ORDER — HYDROCODONE-ACETAMINOPHEN 7.5-325 MG PO TABS: 1.0000 | ORAL_TABLET | ORAL | 0 refills | Status: DC | PRN

## 2018-09-05 MED ORDER — POLYETHYLENE GLYCOL 3350 17 G PO PACK: 17.0000 g | PACK | Freq: Two times a day (BID) | ORAL | 0 refills | Status: DC

## 2018-09-05 MED ORDER — BUPIVACAINE HCL 0.25 % IJ SOLN
INTRAMUSCULAR | Status: DC | PRN
  Administered 2018-09-05: 30 mL

## 2018-09-05 MED ORDER — PHENYLEPHRINE 40 MCG/ML (10ML) SYRINGE FOR IV PUSH (FOR BLOOD PRESSURE SUPPORT)
PREFILLED_SYRINGE | INTRAVENOUS | Status: AC
  Filled 2018-09-05: qty 10

## 2018-09-05 MED ORDER — MIDAZOLAM HCL 2 MG/2ML IJ SOLN
1.0000 mg | Freq: Once | INTRAMUSCULAR | Status: DC
  Filled 2018-09-05: qty 2

## 2018-09-05 MED ORDER — EPHEDRINE 5 MG/ML INJ
INTRAVENOUS | Status: AC
  Filled 2018-09-05: qty 10

## 2018-09-05 MED ORDER — PROPOFOL 500 MG/50ML IV EMUL
INTRAVENOUS | Status: DC | PRN
  Administered 2018-09-05: 50 ug/kg/min via INTRAVENOUS

## 2018-09-05 MED ORDER — SODIUM CHLORIDE 0.9 % IJ SOLN
INTRAMUSCULAR | Status: DC | PRN
  Administered 2018-09-05: 30 mL

## 2018-09-05 MED ORDER — DOCUSATE SODIUM 100 MG PO CAPS
100.0000 mg | ORAL_CAPSULE | Freq: Two times a day (BID) | ORAL | Status: DC
  Administered 2018-09-05 – 2018-09-06 (×2): 100 mg via ORAL
  Filled 2018-09-05 (×2): qty 1

## 2018-09-05 MED ORDER — FENTANYL CITRATE (PF) 100 MCG/2ML IJ SOLN: 25.0000 ug | INTRAMUSCULAR | Status: DC | PRN

## 2018-09-05 MED ORDER — DEXAMETHASONE SODIUM PHOSPHATE 10 MG/ML IJ SOLN
INTRAMUSCULAR | Status: AC
  Filled 2018-09-05: qty 1

## 2018-09-05 MED ORDER — EPHEDRINE SULFATE 50 MG/ML IJ SOLN
INTRAMUSCULAR | Status: DC | PRN
  Administered 2018-09-05: 5 mg via INTRAVENOUS
  Administered 2018-09-05: 10 mg via INTRAVENOUS
  Administered 2018-09-05 (×3): 5 mg via INTRAVENOUS

## 2018-09-05 MED ORDER — ONDANSETRON HCL 4 MG/2ML IJ SOLN: 4.0000 mg | Freq: Once | INTRAMUSCULAR | Status: DC | PRN

## 2018-09-05 MED ORDER — FENTANYL CITRATE (PF) 100 MCG/2ML IJ SOLN
50.0000 ug | Freq: Once | INTRAMUSCULAR | Status: AC
  Administered 2018-09-05: 50 ug via INTRAVENOUS
  Filled 2018-09-05: qty 2

## 2018-09-05 MED ORDER — PHENOL 1.4 % MT LIQD: 1.0000 | OROMUCOSAL | Status: DC | PRN

## 2018-09-05 MED ORDER — METHOCARBAMOL 500 MG IVPB - SIMPLE MED
500.0000 mg | Freq: Four times a day (QID) | INTRAVENOUS | Status: DC | PRN
  Filled 2018-09-05: qty 50

## 2018-09-05 MED ORDER — PROPOFOL 10 MG/ML IV BOLUS
INTRAVENOUS | Status: AC
  Filled 2018-09-05: qty 20

## 2018-09-05 MED ORDER — METHOCARBAMOL 500 MG PO TABS
500.0000 mg | ORAL_TABLET | Freq: Four times a day (QID) | ORAL | Status: DC | PRN
  Administered 2018-09-06 (×2): 500 mg via ORAL
  Filled 2018-09-05 (×3): qty 1

## 2018-09-05 MED ORDER — BUPIVACAINE IN DEXTROSE 0.75-8.25 % IT SOLN
INTRATHECAL | Status: DC | PRN
  Administered 2018-09-05: 2 mL via INTRATHECAL

## 2018-09-05 MED ORDER — FERROUS SULFATE 325 (65 FE) MG PO TABS
325.0000 mg | ORAL_TABLET | Freq: Two times a day (BID) | ORAL | Status: DC
  Administered 2018-09-06: 325 mg via ORAL
  Filled 2018-09-05: qty 1

## 2018-09-05 MED ORDER — PROPOFOL 10 MG/ML IV BOLUS
INTRAVENOUS | Status: AC
  Filled 2018-09-05: qty 40

## 2018-09-05 MED ORDER — ASPIRIN 81 MG PO CHEW: 81.0000 mg | CHEWABLE_TABLET | Freq: Two times a day (BID) | ORAL | 0 refills | Status: AC

## 2018-09-05 MED ORDER — FUROSEMIDE 40 MG PO TABS
40.0000 mg | ORAL_TABLET | Freq: Every day | ORAL | Status: DC
  Administered 2018-09-05 – 2018-09-06 (×2): 40 mg via ORAL
  Filled 2018-09-05 (×2): qty 1

## 2018-09-05 MED ORDER — CEFAZOLIN SODIUM-DEXTROSE 2-4 GM/100ML-% IV SOLN
2.0000 g | Freq: Four times a day (QID) | INTRAVENOUS | Status: AC
  Administered 2018-09-05 – 2018-09-06 (×2): 2 g via INTRAVENOUS
  Filled 2018-09-05 (×2): qty 100

## 2018-09-05 MED ORDER — KETOROLAC TROMETHAMINE 30 MG/ML IJ SOLN
INTRAMUSCULAR | Status: DC | PRN
  Administered 2018-09-05: 30 mg

## 2018-09-05 MED ORDER — ONDANSETRON HCL 4 MG/2ML IJ SOLN
INTRAMUSCULAR | Status: DC | PRN
  Administered 2018-09-05: 4 mg via INTRAVENOUS

## 2018-09-05 MED ORDER — OXYCODONE HCL 5 MG PO TABS: 5.0000 mg | ORAL_TABLET | Freq: Once | ORAL | Status: DC | PRN

## 2018-09-05 MED ORDER — POLYETHYLENE GLYCOL 3350 17 G PO PACK
17.0000 g | PACK | Freq: Two times a day (BID) | ORAL | Status: DC
  Administered 2018-09-05 – 2018-09-06 (×2): 17 g via ORAL
  Filled 2018-09-05 (×2): qty 1

## 2018-09-05 MED ORDER — ALUM & MAG HYDROXIDE-SIMETH 200-200-20 MG/5ML PO SUSP: 15.0000 mL | ORAL | Status: DC | PRN

## 2018-09-05 MED ORDER — METHOCARBAMOL 500 MG PO TABS: 500.0000 mg | ORAL_TABLET | Freq: Four times a day (QID) | ORAL | 0 refills | Status: DC | PRN

## 2018-09-05 MED ORDER — SODIUM CHLORIDE 0.9 % IJ SOLN
INTRAMUSCULAR | Status: AC
  Filled 2018-09-05: qty 50

## 2018-09-05 MED ORDER — ROPIVACAINE HCL 5 MG/ML IJ SOLN
INTRAMUSCULAR | Status: DC | PRN
  Administered 2018-09-05: 30 mL via PERINEURAL

## 2018-09-05 MED ORDER — CELECOXIB 200 MG PO CAPS
200.0000 mg | ORAL_CAPSULE | Freq: Two times a day (BID) | ORAL | Status: DC
  Administered 2018-09-05 – 2018-09-06 (×2): 200 mg via ORAL
  Filled 2018-09-05 (×2): qty 1

## 2018-09-05 MED ORDER — BUPIVACAINE HCL (PF) 0.25 % IJ SOLN
INTRAMUSCULAR | Status: AC
  Filled 2018-09-05: qty 30

## 2018-09-05 MED ORDER — DEXAMETHASONE SODIUM PHOSPHATE 10 MG/ML IJ SOLN
INTRAMUSCULAR | Status: DC | PRN
  Administered 2018-09-05: 10 mg via INTRAVENOUS

## 2018-09-05 MED ORDER — SODIUM CHLORIDE 0.9 % IR SOLN
Status: DC | PRN
  Administered 2018-09-05: 1000 mL

## 2018-09-05 MED ORDER — INOSITOL NIACINATE 500 MG PO CAPS: 500.0000 mg | ORAL_CAPSULE | Freq: Every day | ORAL | Status: DC

## 2018-09-05 MED ORDER — DIPHENHYDRAMINE HCL 12.5 MG/5ML PO ELIX: 12.5000 mg | ORAL_SOLUTION | ORAL | Status: DC | PRN

## 2018-09-05 MED ORDER — MENTHOL 3 MG MT LOZG: 1.0000 | LOZENGE | OROMUCOSAL | Status: DC | PRN

## 2018-09-05 MED ORDER — STERILE WATER FOR IRRIGATION IR SOLN
Status: DC | PRN
  Administered 2018-09-05: 2000 mL

## 2018-09-05 MED ORDER — ACETAMINOPHEN 325 MG PO TABS: 325.0000 mg | ORAL_TABLET | Freq: Four times a day (QID) | ORAL | Status: DC | PRN

## 2018-09-05 MED ORDER — CEFAZOLIN SODIUM-DEXTROSE 2-4 GM/100ML-% IV SOLN
2.0000 g | INTRAVENOUS | Status: AC
  Administered 2018-09-05: 2 g via INTRAVENOUS
  Filled 2018-09-05: qty 100

## 2018-09-05 MED ORDER — DEXAMETHASONE SODIUM PHOSPHATE 10 MG/ML IJ SOLN
10.0000 mg | Freq: Once | INTRAMUSCULAR | Status: AC
  Administered 2018-09-06: 10 mg via INTRAVENOUS
  Filled 2018-09-05: qty 1

## 2018-09-05 MED ORDER — METOCLOPRAMIDE HCL 5 MG/ML IJ SOLN: 5.0000 mg | Freq: Three times a day (TID) | INTRAMUSCULAR | Status: DC | PRN

## 2018-09-05 MED ORDER — KETOROLAC TROMETHAMINE 30 MG/ML IJ SOLN
INTRAMUSCULAR | Status: AC
  Filled 2018-09-05: qty 1

## 2018-09-05 MED ORDER — OXYCODONE HCL 5 MG/5ML PO SOLN
5.0000 mg | Freq: Once | ORAL | Status: DC | PRN
  Filled 2018-09-05: qty 5

## 2018-09-05 MED ORDER — NITROGLYCERIN 0.4 MG SL SUBL: 0.4000 mg | SUBLINGUAL_TABLET | SUBLINGUAL | Status: DC | PRN

## 2018-09-05 SURGICAL SUPPLY — 59 items
ADH SKN CLS APL DERMABOND .7 (GAUZE/BANDAGES/DRESSINGS) ×1
ATTUNE MED ANAT PAT 38 KNEE (Knees) ×1 IMPLANT
ATTUNE MED ANAT PAT 38MM KNEE (Knees) ×1 IMPLANT
ATTUNE PS FEM LT SZ 8 CEM KNEE (Femur) ×2 IMPLANT
ATTUNE PSRP INSR SZ8 6 KNEE (Insert) ×1 IMPLANT
ATTUNE PSRP INSR SZ8 6MM KNEE (Insert) ×1 IMPLANT
BAG SPEC THK2 15X12 ZIP CLS (MISCELLANEOUS)
BAG ZIPLOCK 12X15 (MISCELLANEOUS) IMPLANT
BANDAGE ACE 6X5 VEL STRL LF (GAUZE/BANDAGES/DRESSINGS) ×3 IMPLANT
BANDAGE ELASTIC 6 VELCRO ST LF (GAUZE/BANDAGES/DRESSINGS) ×2 IMPLANT
BASE TIBIAL ROT PLAT SZ 8 KNEE (Knees) IMPLANT
BLADE SAW SGTL 11.0X1.19X90.0M (BLADE) IMPLANT
BLADE SAW SGTL 13.0X1.19X90.0M (BLADE) ×3 IMPLANT
BOWL SMART MIX CTS (DISPOSABLE) ×3 IMPLANT
BSPLAT TIB 8 CMNT ROT PLAT STR (Knees) ×1 IMPLANT
CEMENT HV SMART SET (Cement) ×4 IMPLANT
COVER SURGICAL LIGHT HANDLE (MISCELLANEOUS) ×3 IMPLANT
COVER WAND RF STERILE (DRAPES) ×2 IMPLANT
CUFF TOURN SGL QUICK 34 (TOURNIQUET CUFF) ×3
CUFF TRNQT CYL 34X4X40X1 (TOURNIQUET CUFF) ×1 IMPLANT
DECANTER SPIKE VIAL GLASS SM (MISCELLANEOUS) ×4 IMPLANT
DERMABOND ADVANCED (GAUZE/BANDAGES/DRESSINGS) ×2
DERMABOND ADVANCED .7 DNX12 (GAUZE/BANDAGES/DRESSINGS) ×1 IMPLANT
DRAPE U-SHAPE 47X51 STRL (DRAPES) ×3 IMPLANT
DRESSING AQUACEL AG SP 3.5X10 (GAUZE/BANDAGES/DRESSINGS) ×1 IMPLANT
DRSG AQUACEL AG SP 3.5X10 (GAUZE/BANDAGES/DRESSINGS) ×3
DURAPREP 26ML APPLICATOR (WOUND CARE) ×6 IMPLANT
ELECT REM PT RETURN 15FT ADLT (MISCELLANEOUS) ×3 IMPLANT
GLOVE BIOGEL M 7.0 STRL (GLOVE) IMPLANT
GLOVE BIOGEL PI IND STRL 7.5 (GLOVE) ×1 IMPLANT
GLOVE BIOGEL PI IND STRL 8.5 (GLOVE) ×1 IMPLANT
GLOVE BIOGEL PI INDICATOR 7.5 (GLOVE) ×2
GLOVE BIOGEL PI INDICATOR 8.5 (GLOVE) ×2
GLOVE ECLIPSE 8.0 STRL XLNG CF (GLOVE) ×5 IMPLANT
GLOVE ORTHO TXT STRL SZ7.5 (GLOVE) ×6 IMPLANT
GOWN STRL REUS W/TWL 2XL LVL3 (GOWN DISPOSABLE) ×3 IMPLANT
GOWN STRL REUS W/TWL LRG LVL3 (GOWN DISPOSABLE) ×3 IMPLANT
HANDPIECE INTERPULSE COAX TIP (DISPOSABLE) ×3
HOLDER FOLEY CATH W/STRAP (MISCELLANEOUS) ×2 IMPLANT
MANIFOLD NEPTUNE II (INSTRUMENTS) ×3 IMPLANT
NDL SAFETY ECLIPSE 18X1.5 (NEEDLE) IMPLANT
NEEDLE HYPO 18GX1.5 SHARP (NEEDLE)
PACK TOTAL KNEE CUSTOM (KITS) ×3 IMPLANT
PIN FIX SIGMA HP QUICK REL (PIN) ×2 IMPLANT
PIN THREADED HEADED SIGMA (PIN) ×2 IMPLANT
POSITIONER SURGICAL ARM (MISCELLANEOUS) ×3 IMPLANT
SET HNDPC FAN SPRY TIP SCT (DISPOSABLE) ×1 IMPLANT
SET PAD KNEE POSITIONER (MISCELLANEOUS) ×3 IMPLANT
SUT MNCRL AB 4-0 PS2 18 (SUTURE) ×3 IMPLANT
SUT STRATAFIX PDS+ 0 24IN (SUTURE) ×3 IMPLANT
SUT VIC AB 1 CT1 36 (SUTURE) ×3 IMPLANT
SUT VIC AB 2-0 CT1 27 (SUTURE) ×9
SUT VIC AB 2-0 CT1 TAPERPNT 27 (SUTURE) ×3 IMPLANT
SYRINGE 3CC LL L/F (MISCELLANEOUS) ×1 IMPLANT
TIBIAL BASE ROT PLAT SZ 8 KNEE (Knees) ×3 IMPLANT
TRAY FOLEY MTR SLVR 16FR STAT (SET/KITS/TRAYS/PACK) ×3 IMPLANT
WATER STERILE IRR 1000ML POUR (IV SOLUTION) ×5 IMPLANT
WRAP KNEE MAXI GEL POST OP (GAUZE/BANDAGES/DRESSINGS) ×3 IMPLANT
YANKAUER SUCT BULB TIP 10FT TU (MISCELLANEOUS) ×3 IMPLANT

## 2018-09-05 NOTE — Progress Notes (Signed)
PHARMACIST - PHYSICIAN ORDER COMMUNICATION  CONCERNING: P&T Medication Policy on Herbal Medications  DESCRIPTION:  This patient's order for:  inositol  has been noted.  This product(s) is classified as an "herbal" or natural product. Due to a lack of definitive safety studies or FDA approval, nonstandard manufacturing practices, plus the potential risk of unknown drug-drug interactions while on inpatient medications, the Pharmacy and Therapeutics Committee does not permit the use of "herbal" or natural products of this type within [facility].   ACTION TAKEN: The pharmacy department is unable to verify this order at this time. Please reevaluate patient's clinical condition at discharge and address if the herbal or natural product(s) should be resumed at that time.  Lenis Noon, PharmD 09/05/18 6:59 PM

## 2018-09-05 NOTE — Anesthesia Procedure Notes (Signed)
Spinal  Patient location during procedure: OR Start time: 09/05/2018 2:15 PM End time: 09/05/2018 2:18 PM Staffing Anesthesiologist: Lidia Collum, MD Resident/CRNA: Gwyndolyn Saxon, CRNA Performed: resident/CRNA  Preanesthetic Checklist Completed: patient identified, site marked, surgical consent, pre-op evaluation, timeout performed, IV checked, risks and benefits discussed and monitors and equipment checked Spinal Block Patient position: sitting Prep: Betadine Patient monitoring: heart rate Approach: midline Location: L4-5 Needle Needle type: Pencan  Needle gauge: 24 G Needle length: 10 cm Assessment Sensory level: T6

## 2018-09-05 NOTE — Op Note (Signed)
NAME:  [name] [name]                      MEDICAL RECORD NO.:  [id number]                             FACILITY:  [facility]      PHYSICIAN:  Pietro Cassis. [name], M.D.  DATE OF BIRTH:  1941/02/28      DATE OF PROCEDURE:  09/05/2018                                     OPERATIVE REPORT         PREOPERATIVE DIAGNOSIS:  Left knee osteoarthritis.      POSTOPERATIVE DIAGNOSIS:  Left knee osteoarthritis.      FINDINGS:  The patient was noted to have complete loss of cartilage and   bone-on-bone arthritis with associated osteophytes in the medial and patellofemoral compartments of   the knee with a significant synovitis and associated effusion.  The patient had failed months of conservative treatment including medications, injection therapy, activity modification.     PROCEDURE:  Left total knee replacement.      COMPONENTS USED:  DePuy Attune rotating platform posterior stabilized knee   system, a size 8 femur, 8 tibia, size 6 mm PS AOX insert, and 38 anatomic patellar   button.      SURGEON:  Pietro Cassis. [name], M.D.      ASSISTANT:  Danae Orleans, PA-C.      ANESTHESIA:  Regional and Spinal.      SPECIMENS:  None.      COMPLICATION:  None.      DRAINS:  None.  EBL: <100cc      TOURNIQUET TIME:   Total Tourniquet Time Documented: Thigh (Left) - 30 minutes Total: Thigh (Left) - 30 minutes  .      The patient was stable to the recovery room.      INDICATION FOR PROCEDURE:  [name] is a 77 y.o. male patient of   mine.  The patient had been seen, evaluated, and treated for months conservatively in the   office with medication, activity modification, and injections.  The patient had   radiographic changes of bone-on-bone arthritis with endplate sclerosis and osteophytes noted.  Based on the radiographic changes and failed conservative measures, the patient   decided to proceed with definitive treatment, total knee replacement.  Risks of infection, DVT, component failure, need for  revision surgery, neurovascular injury were reviewed in the office setting.  The postop course was reviewed stressing the efforts to maximize post-operative satisfaction and function.  Consent was obtained for benefit of pain   relief.      PROCEDURE IN DETAIL:  The patient was brought to the operative theater.   Once adequate anesthesia, preoperative antibiotics, 2 gm of Ancef,1 gm of Tranexamic Acid, and 10 mg of Decadron administered, the patient was positioned supine with a left thigh tourniquet placed.  The  left lower extremity was prepped and draped in sterile fashion.  A time-   out was performed identifying the patient, planned procedure, and the appropriate extremity.      The left lower extremity was placed in the [facility] leg holder.  The leg was   exsanguinated, tourniquet elevated to 250 mmHg.  A midline incision was   made followed by  median parapatellar arthrotomy.  Following initial   exposure, attention was first directed to the patella.  Precut   measurement was noted to be 26 mm.  I resected down to 14 mm and used a   38 anatomic patellar button to restore patellar height as well as cover the cut surface.      The lug holes were drilled and a metal shim was placed to protect the   patella from retractors and saw blade during the procedure.      At this point, attention was now directed to the femur.  The femoral   canal was opened with a drill, irrigated to try to prevent fat emboli.  An   intramedullary rod was passed at 5 degrees valgus, 9 mm of bone was   resected off the distal femur.  Following this resection, the tibia was   subluxated anteriorly.  Using the extramedullary guide, 2 mm of bone was resected off   the proximal medial tibia.  We confirmed the gap would be   stable medially and laterally with a size 5 spacer block as well as confirmed that the tibial cut was perpendicular in the coronal plane, checking with an alignment rod.      Once this was done, I  sized the femur to be a size 8 in the anterior-   posterior dimension, chose a standard component based on medial and   lateral dimension.  The size 8 rotation block was then pinned in   position anterior referenced using the C-clamp to set rotation.  The   anterior, posterior, and  chamfer cuts were made without difficulty nor   notching making certain that I was along the anterior cortex to help   with flexion gap stability.      The final box cut was made off the lateral aspect of distal femur.      At this point, the tibia was sized to be a size 8.  The size 8 tray was   then pinned in position through the medial third of the tubercle,   drilled, and keel punched.  Trial reduction was now carried with a 8 femur,  8 tibia, a size 6 mm PS insert, and the 38 anatomic patella botton.  The knee was brought to full extension with good flexion stability with the patella   tracking through the trochlea without application of pressure.  Given   all these findings the trial components removed.  Final components were   opened and cement was mixed.  The knee was irrigated with normal saline solution and pulse lavage.  The synovial lining was   then injected with 30 cc of 0.25% Marcaine with epinephrine, 1 cc of Toradol and 30 cc of NS for a total of 61 cc.     Final implants were then cemented onto cleaned and dried cut surfaces of bone with the knee brought to extension with a size 6 mm PS trial insert.      Once the cement had fully cured, excess cement was removed   throughout the knee.  I confirmed that I was satisfied with the range of   motion and stability, and the final size 6 mm PS AOX insert was chosen.  It was   placed into the knee.      The tourniquet had been let down at 30 minutes.  No significant   hemostasis was required.  The extensor mechanism was then reapproximated using #1 Vicryl and #1 Stratafix  sutures with the knee   in flexion.  The   remaining wound was closed with 2-0  Vicryl and running 4-0 Monocryl.   The knee was cleaned, dried, dressed sterilely using Dermabond and   Aquacel dressing.  The patient was then   brought to recovery room in stable condition, tolerating the procedure   well.   Please note that Physician Assistant, Danae Orleans, PA-C was present for the entirety of the case, and was utilized for pre-operative positioning, peri-operative retractor management, general facilitation of the procedure and for primary wound closure at the end of the case.              Pietro Cassis [name], M.D.    09/05/2018 3:27 PM

## 2018-09-05 NOTE — Transfer of Care (Signed)
Immediate Anesthesia Transfer of Care Note  Patient: [name] [name]  Procedure(s) Performed: LEFT TOTAL KNEE ARTHROPLASTY (Left Knee)  Patient Location: PACU  Anesthesia Type:Spinal  Level of Consciousness: drowsy  Airway & Oxygen Therapy: Patient Spontanous Breathing and Patient connected to face mask oxygen  Post-op Assessment: Report given to RN and Post -op Vital signs reviewed and stable  Post vital signs: Reviewed and stable  Last Vitals:  Vitals Value Taken Time  BP 137/75 09/05/2018  3:52 PM  Temp    Pulse 54 09/05/2018  3:56 PM  Resp 14 09/05/2018  3:56 PM  SpO2 100 % 09/05/2018  3:56 PM  Vitals shown include unvalidated device data.  Last Pain:  Vitals:   09/05/18 1241  TempSrc:   PainSc: 0-No pain         Complications: No apparent anesthesia complications

## 2018-09-05 NOTE — Anesthesia Procedure Notes (Addendum)
Anesthesia Regional Block: Adductor canal block   Pre-Anesthetic Checklist: ,, timeout performed, Correct Patient, Correct Site, Correct Laterality, Correct Procedure, Correct Position, site marked, Risks and benefits discussed,  Surgical consent,  Pre-op evaluation,  At surgeon's request and post-op pain management  Laterality: Left  Prep: chloraprep       Needles:  Injection technique: Single-shot  Needle Type: Echogenic Stimulator Needle     Needle Length: 9cm  Needle Gauge: 21     Additional Needles:   Procedures:,,,, ultrasound used (permanent image in chart),,,,  Narrative:  Start time: 09/05/2018 12:30 PM End time: 09/05/2018 12:42 PM Injection made incrementally with aspirations every 5 mL.  Performed by: Personally  Anesthesiologist: Lidia Collum, MD  Additional Notes: Monitors applied. Injection made in 5cc increments. No resistance to injection. Good needle visualization. Patient tolerated procedure well.

## 2018-09-05 NOTE — Discharge Instructions (Signed)

## 2018-09-05 NOTE — Interval H&P Note (Signed)
History and Physical Interval Note:  09/05/2018 11:32 AM  [name] [name]  has presented today for surgery, with the diagnosis of Left knee osteoarthritis  The various methods of treatment have been discussed with the patient and family. After consideration of risks, benefits and other options for treatment, the patient has consented to  Procedure(s) with comments: LEFT TOTAL KNEE ARTHROPLASTY (Left) - 70 mins as a surgical intervention .  The patient's history has been reviewed, patient examined, no change in status, stable for surgery.  I have reviewed the patient's chart and labs.  Questions were answered to the patient's satisfaction.     [name]

## 2018-09-05 NOTE — Progress Notes (Signed)
Assisted Dr Christella Hartigan with left ultrasound guided adductor canal block. Side rails up, monitors on throughout procedure. See vital signs in flow sheet. Tolerated Procedure well.

## 2018-09-05 NOTE — Anesthesia Preprocedure Evaluation (Addendum)
Anesthesia Evaluation  Patient identified by MRN, date of birth, ID band Patient awake    Reviewed: Allergy & Precautions, NPO status , Patient's Chart, lab work & pertinent test results  History of Anesthesia Complications Negative for: history of anesthetic complications  Airway Mallampati: III  TM Distance: >3 FB Neck ROM: Full    Dental no notable dental hx.    Pulmonary neg pulmonary ROS, former smoker,    breath sounds clear to auscultation       Cardiovascular hypertension, + CAD, + Past MI and + Cardiac Stents (2008)  + dysrhythmias Atrial Fibrillation  Rhythm:Irregular Rate:Bradycardia  TTE 2011: Study Conclusions - Left ventricle: Wall thickness was increased in a pattern of mild LVH. Systolic function was normal. The estimated ejection fraction was in the range of 55% to 60%. Wall motion was normal; there were no regional wall motion abnormalities. - Aortic valve: Mild regurgitation. - Mitral valve: Mild regurgitation. - Left atrium: The atrium was severely dilated. - Right atrium: The atrium was severely dilated.  Per cardiology he is acceptable risk for anesthesia, no additional testing needed.   Neuro/Psych negative neurological ROS  negative psych ROS   GI/Hepatic negative GI ROS, Neg liver ROS,   Endo/Other  negative endocrine ROS  Renal/GU Renal InsufficiencyRenal disease (CKD stage III; solitary kidney)  negative genitourinary   Musculoskeletal  (+) Arthritis , Osteoarthritis,    Abdominal   Peds  Hematology negative hematology ROS (+)   Anesthesia Other Findings   Reproductive/Obstetrics                            Anesthesia Physical Anesthesia Plan  ASA: III  Anesthesia Plan: Spinal   Post-op Pain Management:  Regional for Post-op pain   Induction:   PONV Risk Score and Plan: 1 and Propofol infusion  Airway Management Planned: Natural Airway, Nasal  Cannula and Simple Face Mask  Additional Equipment: None  Intra-op Plan:   Post-operative Plan:   Informed Consent: I have reviewed the patients History and Physical, chart, labs and discussed the procedure including the risks, benefits and alternatives for the proposed anesthesia with the patient or authorized representative who has indicated his/her understanding and acceptance.     Plan Discussed with:   Anesthesia Plan Comments:        Anesthesia Quick Evaluation

## 2018-09-05 NOTE — Anesthesia Postprocedure Evaluation (Signed)
Anesthesia Post Note  Patient: [name] [name]  Procedure(s) Performed: LEFT TOTAL KNEE ARTHROPLASTY (Left Knee)     Patient location during evaluation: PACU Anesthesia Type: Spinal Level of consciousness: oriented and awake and alert Pain management: pain level controlled Vital Signs Assessment: post-procedure vital signs reviewed and stable Respiratory status: spontaneous breathing, respiratory function stable and patient connected to nasal cannula oxygen Cardiovascular status: blood pressure returned to baseline and stable Postop Assessment: no headache, no backache and no apparent nausea or vomiting Anesthetic complications: no    Last Vitals:  Vitals:   09/05/18 1600 09/05/18 1615  BP: (!) 141/67 (!) 149/73  Pulse: (!) 59 63  Resp: 17 14  Temp: (!) 36.3 C (!) 36.3 C  SpO2: 100% 100%    Last Pain:  Vitals:   09/05/18 1615  TempSrc:   PainSc: 0-No pain            L Sensory Level: L2-Upper inner thigh, upper buttock (09/05/18 1615) R Sensory Level: L3-Anterior knee, lower leg (09/05/18 1615)  Lidia Collum

## 2018-09-05 NOTE — Addendum Note (Signed)
Addendum  created 09/05/18 1648 by Lidia Collum, MD   Order list changed, Order sets accessed

## 2018-09-06 ENCOUNTER — Encounter (HOSPITAL_COMMUNITY): Payer: Self-pay | Admitting: Orthopedic Surgery

## 2018-09-06 DIAGNOSIS — E663 Overweight: Secondary | ICD-10-CM | POA: Diagnosis present

## 2018-09-06 DIAGNOSIS — Z6826 Body mass index (BMI) 26.0-26.9, adult: Secondary | ICD-10-CM | POA: Diagnosis present

## 2018-09-06 LAB — CBC
HEMATOCRIT: 37.4 % — AB (ref 39.0–52.0)
HEMOGLOBIN: 12.1 g/dL — AB (ref 13.0–17.0)
MCH: 30.9 pg (ref 26.0–34.0)
MCHC: 32.4 g/dL (ref 30.0–36.0)
MCV: 95.7 fL (ref 80.0–100.0)
Platelets: 139 10*3/uL — ABNORMAL LOW (ref 150–400)
RBC: 3.91 MIL/uL — AB (ref 4.22–5.81)
RDW: 12.9 % (ref 11.5–15.5)
WBC: 12 10*3/uL — ABNORMAL HIGH (ref 4.0–10.5)
nRBC: 0 % (ref 0.0–0.2)

## 2018-09-06 LAB — BASIC METABOLIC PANEL
Anion gap: 11 (ref 5–15)
BUN: 20 mg/dL (ref 8–23)
CHLORIDE: 102 mmol/L (ref 98–111)
CO2: 24 mmol/L (ref 22–32)
Calcium: 8 mg/dL — ABNORMAL LOW (ref 8.9–10.3)
Creatinine, Ser: 1.16 mg/dL (ref 0.61–1.24)
GFR calc Af Amer: >60 mL/min (ref >60)
GFR calc non Af Amer: 58 mL/min — ABNORMAL LOW (ref >60)
Glucose, Bld: 140 mg/dL — ABNORMAL HIGH (ref 70–99)
POTASSIUM: 4 mmol/L (ref 3.5–5.1)
SODIUM: 137 mmol/L (ref 135–145)

## 2018-09-06 NOTE — Addendum Note (Signed)
Addendum  created 09/06/18 0706 by Lollie Sails, CRNA   Charge Capture section accepted

## 2018-09-06 NOTE — Progress Notes (Signed)
Discharge paperwork discussed with pt and wife at the bedside.  They demonstrated understanding. Pt was escorted by wheelchair in stable condition to main lobby.

## 2018-09-06 NOTE — Care Management (Addendum)
DC plan per pt conversation and MD note: OPPT. No DME needs. Marney Doctor RN,BSN [phone]

## 2018-09-06 NOTE — Evaluation (Addendum)
Physical Therapy Evaluation Patient Details Name: [name] [name] MRN: [id number] DOB: [birth year] Today's Date: 09/06/2018   History of Present Illness  77 yo male s/p L TKA 09/05/18.   Clinical Impression  On eval, pt required Min assist for mobility. He walked ~75 feet with a RW. Minimal pain with activity. Will plan to see a 2nd time prior to d/c later today. Plan is for d/c home with OP PT f/u.    Follow Up Recommendations Follow surgeon's recommendation for DC plan and follow-up therapies    Equipment Recommendations  None recommended by PT    Recommendations for Other Services       Precautions / Restrictions Precautions Precautions: Fall;Knee Restrictions Weight Bearing Restrictions: No Other Position/Activity Restrictions: WBAT      Mobility  Bed Mobility Overal bed mobility: Needs Assistance Bed Mobility: Supine to Sit     Supine to sit: Supervision;HOB elevated     General bed mobility comments: for safety  Transfers Overall transfer level: Needs assistance Equipment used: Rolling walker (2 wheeled) Transfers: Sit to/from Stand Sit to Stand: From elevated surface         General transfer comment: VCS safety, technique hand/LE placement. Assist to steady.   Ambulation/Gait Ambulation/Gait assistance: Min guard Gait Distance (Feet): 75 Feet Assistive device: Rolling walker (2 wheeled) Gait Pattern/deviations: Step-to pattern     General Gait Details: VCs safety, sequence. Slow gait speed.   Stairs            Wheelchair Mobility    Modified Rankin (Stroke Patients Only)       Balance Overall balance assessment: Mild deficits observed, not formally tested                                           Pertinent Vitals/Pain Pain Assessment: 0-10 Pain Score: 3  Pain Location: L knee Pain Descriptors / Indicators: Aching;Sore;Discomfort Pain Intervention(s): Monitored during session;Repositioned;Ice applied    Home  Living Family/patient expects to be discharged to:: Private residence Living Arrangements: Spouse/significant other;Other relatives   Type of Home: House Home Access: Stairs to enter Entrance Stairs-Rails: Psychiatric nurse of Steps: 4 Home Layout: One level Home Equipment: Walker - 2 wheels;Crutches;Cane - single point;Bedside commode      Prior Function Level of Independence: Independent               Hand Dominance        Extremity/Trunk Assessment   Upper Extremity Assessment Upper Extremity Assessment: Overall WFL for tasks assessed    Lower Extremity Assessment Lower Extremity Assessment: Generalized weakness    Cervical / Trunk Assessment Cervical / Trunk Assessment: Normal  Communication   Communication: No difficulties  Cognition Arousal/Alertness: Awake/alert Behavior During Therapy: WFL for tasks assessed/performed Overall Cognitive Status: Within Functional Limits for tasks assessed                                        General Comments      Exercises Total Joint Exercises Ankle Circles/Pumps: AROM;Both;10 reps;Seated Quad Sets: AROM;Both;10 reps;Seated Hip ABduction/ADduction: AROM;Left;10 reps;Seated Straight Leg Raises: AROM;Left;10 reps;Seated Knee Flexion: AAROM;Left;10 reps;Seated Goniometric ROM: ~10-75 degrees   Assessment/Plan    PT Assessment Patient needs continued PT services  PT Problem List Decreased strength;Decreased range of motion;Decreased mobility;Decreased activity tolerance;Decreased balance;Decreased  knowledge of use of DME;Pain       PT Treatment Interventions DME instruction;Gait training;Functional mobility training;Therapeutic activities;Balance training;Patient/family education;Therapeutic exercise    PT Goals (Current goals can be found in the Care Plan section)  Acute Rehab PT Goals Patient Stated Goal: home. regain PLOF.  PT Goal Formulation: With patient Time For Goal  Achievement: 09/20/18 Potential to Achieve Goals: Good    Frequency 7X/week   Barriers to discharge        Co-evaluation               AM-PAC PT "6 Clicks" Daily Activity  Outcome Measure Difficulty turning over in bed (including adjusting bedclothes, sheets and blankets)?: None Difficulty moving from lying on back to sitting on the side of the bed? : None Difficulty sitting down on and standing up from a chair with arms (e.g., wheelchair, bedside commode, etc,.)?: A Little Help needed moving to and from a bed to chair (including a wheelchair)?: A Little Help needed walking in hospital room?: A Little Help needed climbing 3-5 steps with a railing? : A Little 6 Click Score: 20    End of Session Equipment Utilized During Treatment: Gait belt Activity Tolerance: Patient tolerated treatment well Patient left: in chair;with call bell/phone within reach;with chair alarm set   PT Visit Diagnosis: Pain;Other abnormalities of gait and mobility (R26.89);Unsteadiness on feet (R26.81) Pain - Right/Left: Left Pain - part of body: Knee    Time: 8208-1388 PT Time Calculation (min) (ACUTE ONLY): 25 min   Charges:   PT Evaluation $PT Eval Low Complexity: 1 Low PT Treatments $Gait Training: 8-22 mins          Weston Anna, PT Acute Rehabilitation Services Pager: [phone] Office: [phone]

## 2018-09-06 NOTE — Progress Notes (Signed)
     Subjective: 1 Day Post-Op Procedure(s) (LRB): LEFT TOTAL KNEE ARTHROPLASTY (Left)   Patient reports pain as mild, pain controlled. No events throughout the night.  Discussed the possibility of proceeding with the other knee at some point, once he is healed.   Ready to be discharged home if he works well with PT.    Patient's anticipated LOS is less than 2 midnights, meeting these requirements: - Lives within 1 hour of care - Has a competent adult at home to recover with post-op recover - NO history of  - Chronic pain requiring opiods  - Diabetes  - Heart failure  - Heart attack  - Stroke  - DVT/VTE  - Cardiac arrhythmia  - Respiratory Failure/COPD  - Anemia  - Advanced Liver disease       Objective:   VITALS:   Vitals:   09/06/18 0200 09/06/18 0511  BP: 120/70 114/62  Pulse: (!) 50 (!) 52  Resp: 18 16  Temp: (!) 97.4 F (36.3 C) 97.8 F (36.6 C)  SpO2: 100% 100%    Dorsiflexion/Plantar flexion intact Incision: dressing C/D/I No cellulitis present Compartment soft  LABS Recent Labs    09/06/18 0506  HGB 12.1*  HCT 37.4*  WBC 12.0*  PLT 139*    Recent Labs    09/06/18 0506  NA 137  K 4.0  BUN 20  CREATININE 1.16  GLUCOSE 140*     Assessment/Plan: 1 Day Post-Op Procedure(s) (LRB): LEFT TOTAL KNEE ARTHROPLASTY (Left) Foley cath d/c'ed Advance diet Up with therapy D/C IV fluids Discharge home Follow up in 2 weeks at [facility] ([place]). Follow up with OLIN,Anna Livers D in 2 weeks.  Contact information:  [facility]) [address] [place] [phone]    Overweight (BMI 25-29.9) Estimated body mass index is 25.77 kg/m as calculated from the following:   Height as of this encounter: 6' (1.829 m).   Weight as of this encounter: 86.2 kg. Patient also counseled that weight may inhibit the healing process Patient counseled that losing weight will  help with future health issues        West Pugh. Rozella Servello   PAC  09/06/2018, 7:59 AM

## 2018-09-06 NOTE — Progress Notes (Signed)
Physical Therapy Treatment Patient Details Name: [name] [name] MRN: [id number] DOB: 08-17-1941 Today's Date: 09/06/2018    History of Present Illness 77 yo male s/p L TKA 09/05/18.     PT Comments    Progressing well with mobility. Reviewed/practiced exercises, gait training,and stair training. Issued HEP for pt to perform 2x/day until he begins OP PT. All education completed. Ok to d/c from PT standpoint-made RN aware.     Follow Up Recommendations  Follow surgeon's recommendation for DC plan and follow-up therapies     Equipment Recommendations  None recommended by PT    Recommendations for Other Services       Precautions / Restrictions Precautions Precautions: Fall;Knee Restrictions Weight Bearing Restrictions: No Other Position/Activity Restrictions: WBAT    Mobility  Bed Mobility Overal bed mobility: Needs Assistance Bed Mobility: Supine to Sit     Supine to sit: Supervision;HOB elevated     General bed mobility comments: for safety  Transfers Overall transfer level: Needs assistance Equipment used: Rolling walker (2 wheeled) Transfers: Sit to/from Stand Sit to Stand: Min guard         General transfer comment: Close guard for safety. VCs safety, technique, hand/LE placement.   Ambulation/Gait Ambulation/Gait assistance: Min guard Gait Distance (Feet): 200 Feet Assistive device: Rolling walker (2 wheeled) Gait Pattern/deviations: Step-to pattern     General Gait Details: VCs safety, sequence, proper use of RW. Slow gait speed. Pt preferred to pick walker up instead of rolling   Stairs Stairs: Yes Stairs assistance: Min guard Stair Management: One rail Right;Step to pattern;Forwards Number of Stairs: 3 General stair comments: up and over gym stairs. VCs safety, technique, sequence. Wife present to observe. Close guard for safety. 2 hands on R rail.   Wheelchair Mobility    Modified Rankin (Stroke Patients Only)       Balance Overall  balance assessment: Mild deficits observed, not formally tested                                          Cognition Arousal/Alertness: Awake/alert Behavior During Therapy: WFL for tasks assessed/performed Overall Cognitive Status: Within Functional Limits for tasks assessed                                        Exercises Total Joint Exercises Ankle Circles/Pumps: AROM;Both;Seated;5 reps Quad Sets: AROM;Both;Seated;5 reps Hip ABduction/ADduction: AROM;Left;Seated;5 reps Straight Leg Raises: AROM;Left;Seated;5 reps Knee Flexion: AAROM;Left;Seated;5 reps Goniometric ROM: ~10-75 degrees    General Comments        Pertinent Vitals/Pain Pain Assessment: 0-10 Pain Score: 3  Pain Location: L knee Pain Descriptors / Indicators: Aching;Sore;Discomfort Pain Intervention(s): Monitored during session;Repositioned;Ice applied    Home Living                      Prior Function            PT Goals (current goals can now be found in the care plan section) Acute Rehab PT Goals Patient Stated Goal: home. regain PLOF.  PT Goal Formulation: With patient Time For Goal Achievement: 09/20/18 Potential to Achieve Goals: Good Progress towards PT goals: Progressing toward goals    Frequency    7X/week      PT Plan Current plan remains appropriate    Co-evaluation  AM-PAC PT "6 Clicks" Daily Activity  Outcome Measure  Difficulty turning over in bed (including adjusting bedclothes, sheets and blankets)?: None Difficulty moving from lying on back to sitting on the side of the bed? : None Difficulty sitting down on and standing up from a chair with arms (e.g., wheelchair, bedside commode, etc,.)?: A Little Help needed moving to and from a bed to chair (including a wheelchair)?: A Little Help needed walking in hospital room?: A Little Help needed climbing 3-5 steps with a railing? : A Little 6 Click Score: 20    End of  Session Equipment Utilized During Treatment: Gait belt Activity Tolerance: Patient tolerated treatment well Patient left: in chair;with call bell/phone within reach;with family/visitor present   PT Visit Diagnosis: Pain;Other abnormalities of gait and mobility (R26.89);Unsteadiness on feet (R26.81) Pain - Right/Left: Left Pain - part of body: Knee     Time: 5465-6812 PT Time Calculation (min) (ACUTE ONLY): 20 min  Charges:  $Gait Training: 8-22 mins                       Weston Anna, PT Acute Rehabilitation Services Pager: [phone] Office: [phone]

## 2018-09-09 DIAGNOSIS — R110 Nausea: Secondary | ICD-10-CM | POA: Diagnosis not present

## 2018-09-09 DIAGNOSIS — I4891 Unspecified atrial fibrillation: Secondary | ICD-10-CM | POA: Diagnosis not present

## 2018-09-09 DIAGNOSIS — R1112 Projectile vomiting: Secondary | ICD-10-CM | POA: Diagnosis not present

## 2018-09-10 ENCOUNTER — Other Ambulatory Visit: Payer: Self-pay

## 2018-09-10 ENCOUNTER — Observation Stay (HOSPITAL_COMMUNITY)
Admission: EM | Admit: 2018-09-10 | Discharge: 2018-09-11 | Disposition: A | Discharge status: Home / Self Care | Payer: Medicare HMO | Attending: Internal Medicine | Admitting: Internal Medicine

## 2018-09-10 ENCOUNTER — Observation Stay (HOSPITAL_COMMUNITY): Payer: Medicare HMO

## 2018-09-10 ENCOUNTER — Encounter (HOSPITAL_COMMUNITY): Payer: Self-pay | Admitting: Emergency Medicine

## 2018-09-10 DIAGNOSIS — I2510 Atherosclerotic heart disease of native coronary artery without angina pectoris: Secondary | ICD-10-CM

## 2018-09-10 DIAGNOSIS — Z79899 Other long term (current) drug therapy: Secondary | ICD-10-CM | POA: Diagnosis not present

## 2018-09-10 DIAGNOSIS — R112 Nausea with vomiting, unspecified: Principal | ICD-10-CM | POA: Diagnosis present

## 2018-09-10 DIAGNOSIS — I495 Sick sinus syndrome: Secondary | ICD-10-CM | POA: Diagnosis present

## 2018-09-10 DIAGNOSIS — Z87891 Personal history of nicotine dependence: Secondary | ICD-10-CM | POA: Diagnosis not present

## 2018-09-10 DIAGNOSIS — R1112 Projectile vomiting: Secondary | ICD-10-CM | POA: Diagnosis not present

## 2018-09-10 DIAGNOSIS — I4811 Longstanding persistent atrial fibrillation: Secondary | ICD-10-CM

## 2018-09-10 DIAGNOSIS — K219 Gastro-esophageal reflux disease without esophagitis: Secondary | ICD-10-CM | POA: Diagnosis present

## 2018-09-10 DIAGNOSIS — K8020 Calculus of gallbladder without cholecystitis without obstruction: Secondary | ICD-10-CM | POA: Diagnosis not present

## 2018-09-10 DIAGNOSIS — N183 Chronic kidney disease, stage 3 unspecified: Secondary | ICD-10-CM | POA: Diagnosis present

## 2018-09-10 DIAGNOSIS — K5900 Constipation, unspecified: Secondary | ICD-10-CM | POA: Insufficient documentation

## 2018-09-10 DIAGNOSIS — Z905 Acquired absence of kidney: Secondary | ICD-10-CM | POA: Diagnosis not present

## 2018-09-10 DIAGNOSIS — I4891 Unspecified atrial fibrillation: Secondary | ICD-10-CM | POA: Insufficient documentation

## 2018-09-10 DIAGNOSIS — Z96652 Presence of left artificial knee joint: Secondary | ICD-10-CM | POA: Insufficient documentation

## 2018-09-10 DIAGNOSIS — R110 Nausea: Secondary | ICD-10-CM | POA: Diagnosis not present

## 2018-09-10 LAB — TROPONIN I
Troponin I: <0.03 ng/mL (ref <0.03)
Troponin I: 0.03 ng/mL (ref <0.03)
Troponin I: 0.04 ng/mL (ref <0.03)

## 2018-09-10 LAB — COMPREHENSIVE METABOLIC PANEL
ALBUMIN: 3.7 g/dL (ref 3.5–5.0)
ALT: 16 U/L (ref 0–44)
AST: 34 U/L (ref 15–41)
Alkaline Phosphatase: 45 U/L (ref 38–126)
Anion gap: 10 (ref 5–15)
BUN: 22 mg/dL (ref 8–23)
CHLORIDE: 103 mmol/L (ref 98–111)
CO2: 26 mmol/L (ref 22–32)
CREATININE: 1.17 mg/dL (ref 0.61–1.24)
Calcium: 8.9 mg/dL (ref 8.9–10.3)
GFR calc Af Amer: >60 mL/min (ref >60)
GFR calc non Af Amer: 57 mL/min — ABNORMAL LOW (ref >60)
GLUCOSE: 131 mg/dL — AB (ref 70–99)
Potassium: 3.8 mmol/L (ref 3.5–5.1)
SODIUM: 139 mmol/L (ref 135–145)
Total Bilirubin: 1.2 mg/dL (ref 0.3–1.2)
Total Protein: 6.9 g/dL (ref 6.5–8.1)

## 2018-09-10 LAB — LIPASE, BLOOD: LIPASE: 43 U/L (ref 11–51)

## 2018-09-10 LAB — CBC
HEMATOCRIT: 37.8 % — AB (ref 39.0–52.0)
Hemoglobin: 11.9 g/dL — ABNORMAL LOW (ref 13.0–17.0)
MCH: 29.6 pg (ref 26.0–34.0)
MCHC: 31.5 g/dL (ref 30.0–36.0)
MCV: 94 fL (ref 80.0–100.0)
Platelets: 165 10*3/uL (ref 150–400)
RBC: 4.02 MIL/uL — ABNORMAL LOW (ref 4.22–5.81)
RDW: 13.3 % (ref 11.5–15.5)
WBC: 8.9 10*3/uL (ref 4.0–10.5)
nRBC: 0 % (ref 0.0–0.2)

## 2018-09-10 LAB — TSH: TSH: 3.185 u[IU]/mL (ref 0.350–4.500)

## 2018-09-10 MED ORDER — METOCLOPRAMIDE HCL 5 MG/ML IJ SOLN
10.0000 mg | Freq: Once | INTRAMUSCULAR | Status: AC
  Administered 2018-09-10: 10 mg via INTRAVENOUS
  Filled 2018-09-10: qty 2

## 2018-09-10 MED ORDER — ASPIRIN 81 MG PO CHEW
81.0000 mg | CHEWABLE_TABLET | Freq: Two times a day (BID) | ORAL | Status: DC
  Administered 2018-09-10 – 2018-09-11 (×3): 81 mg via ORAL
  Filled 2018-09-10 (×3): qty 1

## 2018-09-10 MED ORDER — VITAMIN B-12 1000 MCG PO TABS
500.0000 ug | ORAL_TABLET | Freq: Every day | ORAL | Status: DC
  Administered 2018-09-10 – 2018-09-11 (×2): 500 ug via ORAL
  Filled 2018-09-10 (×4): qty 1

## 2018-09-10 MED ORDER — ACETAMINOPHEN 325 MG PO TABS: 650.0000 mg | ORAL_TABLET | Freq: Four times a day (QID) | ORAL | Status: DC | PRN

## 2018-09-10 MED ORDER — SORBITOL 70 % SOLN
960.0000 mL | TOPICAL_OIL | Freq: Once | ORAL | Status: DC
  Filled 2018-09-10: qty 473

## 2018-09-10 MED ORDER — ONDANSETRON HCL 4 MG/2ML IJ SOLN
4.0000 mg | Freq: Four times a day (QID) | INTRAMUSCULAR | Status: DC | PRN
  Administered 2018-09-11: 4 mg via INTRAVENOUS
  Filled 2018-09-10 (×2): qty 2

## 2018-09-10 MED ORDER — SODIUM CHLORIDE 0.9 % IV SOLN
INTRAVENOUS | Status: DC
  Administered 2018-09-10: 07:00:00 via INTRAVENOUS

## 2018-09-10 MED ORDER — ONDANSETRON HCL 4 MG/2ML IJ SOLN
4.0000 mg | Freq: Once | INTRAMUSCULAR | Status: AC | PRN
  Administered 2018-09-10: 4 mg via INTRAVENOUS
  Filled 2018-09-10: qty 2

## 2018-09-10 MED ORDER — METHOCARBAMOL 500 MG PO TABS: 500.0000 mg | ORAL_TABLET | Freq: Four times a day (QID) | ORAL | Status: DC | PRN

## 2018-09-10 MED ORDER — ATORVASTATIN CALCIUM 40 MG PO TABS
40.0000 mg | ORAL_TABLET | Freq: Every evening | ORAL | Status: DC
  Administered 2018-09-10: 40 mg via ORAL
  Filled 2018-09-10: qty 1

## 2018-09-10 MED ORDER — ONDANSETRON HCL 4 MG/2ML IJ SOLN
4.0000 mg | Freq: Once | INTRAMUSCULAR | Status: AC
  Administered 2018-09-10: 4 mg via INTRAVENOUS
  Filled 2018-09-10: qty 2

## 2018-09-10 MED ORDER — INOSITOL NIACINATE 500 MG PO CAPS: 500.0000 mg | ORAL_CAPSULE | Freq: Every day | ORAL | Status: DC

## 2018-09-10 MED ORDER — SENNOSIDES-DOCUSATE SODIUM 8.6-50 MG PO TABS
1.0000 | ORAL_TABLET | Freq: Two times a day (BID) | ORAL | Status: DC
  Administered 2018-09-10 – 2018-09-11 (×3): 1 via ORAL
  Filled 2018-09-10 (×3): qty 1

## 2018-09-10 MED ORDER — PROMETHAZINE HCL 25 MG/ML IJ SOLN
12.5000 mg | Freq: Once | INTRAMUSCULAR | Status: AC
  Administered 2018-09-10: 12.5 mg via INTRAVENOUS
  Filled 2018-09-10: qty 1

## 2018-09-10 MED ORDER — METOCLOPRAMIDE HCL 5 MG/ML IJ SOLN
5.0000 mg | Freq: Four times a day (QID) | INTRAMUSCULAR | Status: DC | PRN
  Administered 2018-09-10: 5 mg via INTRAVENOUS
  Filled 2018-09-10 (×2): qty 2

## 2018-09-10 MED ORDER — POLYETHYLENE GLYCOL 3350 17 G PO PACK
17.0000 g | PACK | Freq: Two times a day (BID) | ORAL | Status: DC
  Administered 2018-09-10 – 2018-09-11 (×3): 17 g via ORAL
  Filled 2018-09-10 (×3): qty 1

## 2018-09-10 MED ORDER — SODIUM CHLORIDE 0.9 % IV BOLUS (SEPSIS)
1000.0000 mL | Freq: Once | INTRAVENOUS | Status: AC
  Administered 2018-09-10: 1000 mL via INTRAVENOUS

## 2018-09-10 MED ORDER — CALCITRIOL 0.25 MCG PO CAPS
0.5000 ug | ORAL_CAPSULE | Freq: Every day | ORAL | Status: DC
  Administered 2018-09-10 – 2018-09-11 (×2): 0.5 ug via ORAL
  Filled 2018-09-10 (×2): qty 2

## 2018-09-10 MED ORDER — FUROSEMIDE 40 MG PO TABS
40.0000 mg | ORAL_TABLET | Freq: Every day | ORAL | Status: DC
  Administered 2018-09-10: 40 mg via ORAL
  Filled 2018-09-10: qty 1

## 2018-09-10 MED ORDER — ENOXAPARIN SODIUM 40 MG/0.4ML ~~LOC~~ SOLN
40.0000 mg | SUBCUTANEOUS | Status: DC
  Administered 2018-09-11: 40 mg via SUBCUTANEOUS
  Filled 2018-09-10: qty 0.4

## 2018-09-10 MED ORDER — DOCUSATE SODIUM 100 MG PO CAPS
100.0000 mg | ORAL_CAPSULE | Freq: Two times a day (BID) | ORAL | Status: DC
  Administered 2018-09-10: 100 mg via ORAL
  Filled 2018-09-10: qty 1

## 2018-09-10 MED ORDER — TRAMADOL HCL 50 MG PO TABS: 50.0000 mg | ORAL_TABLET | Freq: Four times a day (QID) | ORAL | Status: DC | PRN

## 2018-09-10 MED ORDER — ALUM & MAG HYDROXIDE-SIMETH 200-200-20 MG/5ML PO SUSP
30.0000 mL | Freq: Once | ORAL | Status: AC
  Administered 2018-09-10: 30 mL via ORAL
  Filled 2018-09-10: qty 30

## 2018-09-10 MED ORDER — ACETAMINOPHEN 650 MG RE SUPP: 650.0000 mg | Freq: Four times a day (QID) | RECTAL | Status: DC | PRN

## 2018-09-10 NOTE — ED Provider Notes (Signed)
[facility] EMERGENCY DEPARTMENT Provider Note   CSN: [id number] Arrival date & time: 09/10/18  0124     History   Chief Complaint Chief Complaint  Patient presents with  . Nausea    HPI [name] [name] is a 77 y.o. male.  The history is provided by the patient and the spouse.  Emesis   This is a new problem. The current episode started more than 2 days ago. The problem has been gradually worsening. The emesis has an appearance of stomach contents. There has been no fever. Associated symptoms include headaches. Pertinent negatives include no abdominal pain, no diarrhea and no fever.   Patient with history of permanent atrial fibrillation, CAD, presents with postoperative nausea and vomiting. Patient underwent a left total knee arthroplasty on October 22 He reports he had onset of nausea soon after the surgery.  Since that time his nausea has been increasing with heartburn.  He is now vomiting nonbloody emesis.  No constipation, no diarrhea, reports normal BM.  No abdominal pain.  He reports he had some chest tightness earlier in the day, none at this time.  No shortness of breath.  No fevers. He reports the pain in his left knee is improving.  He does report recently taking iron, as well as pain medications. Past Medical History:  Diagnosis Date  . Arthritis    shoulders and ribs   . Atrial fibrillation ([place])    atrial fib/ LOV [name] PA 04/11/12 EPIC, - CHEST X RAY, EKG 5/13 EPIC  . CAD (coronary artery disease)     s/p NSTEMI 04/08;  [place] 02/2007: Proximal LAD 75%, mid LAD 99%, proximal RCA 25%.  PCI:  Cypher DES to the proximal and mid LAD.  Last nuclear study 11/2011 (after a trip to the ED with CP): EF 58%, low risk study with small inferior wall infarct at the mid and basal level, no ischemia.  Last echo 01/2010: Mild LVH, EF 55-60%, mild AI, mild MR, severely dilated LA and RA  . Contrast dye induced nephropathy    Hx ARF secondary to contrast nephropathy  . Dyslipidemia   .  Epididymitis   . History of blood transfusion   . Myocardial infarction ([place]) 2008  . Pleurisy    2012  . Pneumonia    hx of   . 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 Spinal stenosis   . Urothelial cancer ([place])    Dr. [name],  skin cancer non melanoma    Patient Active Problem List   Diagnosis Date Noted  . Overweight (BMI 25.0-29.9) 09/06/2018  . S/P left TKA 09/05/2018  . 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 Elevated PSA 09/14/2017  . Primary osteoarthritis of left knee 01/27/2017  . OA (osteoarthritis) of knee 01/26/2017  . Spinal stenosis of lumbar region 12/08/2016  . Reflux 11/26/2011  . Mixed hyperlipidemia 10/05/2008  . CAD, NATIVE VESSEL 10/05/2008  . Atrial fibrillation ([place]) 10/05/2008  . CKD (chronic kidney disease), stage III ([place]) 10/05/2008    Past Surgical History:  Procedure Laterality Date  . arm surgery     orif right elbow  . CARDIAC CATHETERIZATION  2008  . coronary stents     . CYSTECTOMY  07/15/08  . CYSTOSCOPY WITH BIOPSY  03/23/2012   Procedure: CYSTOSCOPY WITH BIOPSY;  Surgeon: [name], MD;  Location: [facility];  Service: Urology;  Laterality: Right;   BRUSH BIOPSY RIGHT URETERAL STENT  . CYSTOSCOPY WITH URETEROSCOPY  03/23/2012  Procedure: CYSTOSCOPY WITH URETEROSCOPY;  Surgeon: [name], MD;  Location: [facility];  Service: Urology;  Laterality: Right;    **OR Room #8 requested**  C-ARM   . CYSTOSCOPY/RETROGRADE/URETEROSCOPY  02/15/2012   Procedure: CYSTOSCOPY/RETROGRADE/URETEROSCOPY;  Surgeon: [name], MD;  Location: [facility];  Service: Urology;  Laterality: Right;  C-ARM  . JOINT REPLACEMENT     Dr. [name] 09-05-18   . left pinky finger      tip removed from accident  . right kidney removed      2013  . TOTAL KNEE ARTHROPLASTY Left 09/05/2018   Procedure: LEFT TOTAL KNEE ARTHROPLASTY;  Surgeon: [name], MD;  Location: [facility];  Service: Orthopedics;  Laterality: Left;  70 mins with abductor block  . URETER SURGERY          Home  Medications    Prior to Admission medications   Medication Sig Start Date End Date Taking? Authorizing Provider  aspirin (ASPIRIN CHILDRENS) 81 MG chewable tablet Chew 1 tablet (81 mg total) by mouth 2 (two) times daily. Take for 4 weeks, then resume regular dose. 09/06/18 10/06/18  Danae [place], PA-C  atorvastatin (LIPITOR) 40 MG tablet Take 1 tablet (40 mg total) by mouth every evening. 12/01/17   Alycia Rossetti, MD  calcitRIOL (ROCALTROL) 0.5 MCG capsule Take 0.5 mcg by mouth daily.    [provider]  docusate sodium (COLACE) 100 MG capsule Take 1 capsule (100 mg total) by mouth 2 (two) times daily. 09/05/18   Danae [place], PA-C  ferrous sulfate (FERROUSUL) 325 (65 FE) MG tablet Take 1 tablet (325 mg total) by mouth 3 (three) times daily with meals. 09/05/18   Danae [place], PA-C  furosemide (LASIX) 40 MG tablet Take 1 tablet (40 mg total) by mouth daily. 12/01/17   [place], [name], MD  HYDROcodone-acetaminophen (NORCO) 7.5-325 MG tablet Take 1-2 tablets by mouth every 4 (four) hours as needed for moderate pain. 09/05/18   Danae [place], PA-C  HYDROcodone-acetaminophen (NORCO) 7.5-325 MG tablet Take 1-2 tablets by mouth every 4 (four) hours as needed for moderate pain. 09/11/18   Danae [place], PA-C  Inositol Niacinate (NIACIN FLUSH FREE) 500 MG CAPS Take 500 mg by mouth daily.     [provider]  methocarbamol (ROBAXIN) 500 MG tablet Take 1 tablet (500 mg total) by mouth every 6 (six) hours as needed for muscle spasms. 09/05/18   Danae [place], PA-C  NITROSTAT 0.4 MG SL tablet Place 1 tablet (0.4 mg total) under the tongue every 5 (five) minutes as needed for chest pain. 09/18/15   Isaiah Serge, NP  Omega-3 Fatty Acids (FISH OIL) 1200 MG CAPS Take 1,200 mg by mouth daily.     [provider]  polyethylene glycol (MIRALAX / GLYCOLAX) packet Take 17 g by mouth 2 (two) times daily. 09/05/18   Danae [place], PA-C  vitamin B-12 (CYANOCOBALAMIN) 500  MCG tablet Take 500 mcg by mouth daily.    [provider]    Family History Family History  Problem Relation Age of Onset  . Cancer Mother   . Diabetes type II Mother   . Hyperlipidemia Mother   . Coronary artery disease Father   . Heart attack Father     Social History Social History   Tobacco Use  . Smoking status: Former Smoker    Packs/day: 2.00    Years: 10.00    Pack years: 20.00    Types: Cigarettes, Cigars    Last attempt to quit: 11/15/1981  Years since quitting: 36.8  . Smokeless tobacco: Never Used  Substance Use Topics  . Alcohol use: No  . Drug use: No     Allergies   Contrast media [iodinated diagnostic agents]; Iodine-131; and Other   Review of Systems Review of Systems  Constitutional: Negative for fever.  Respiratory: Negative for shortness of breath.   Gastrointestinal: Positive for vomiting. Negative for abdominal pain, blood in stool, constipation and diarrhea.  Neurological: Positive for headaches.  All other systems reviewed and are negative.    Physical Exam Updated Vital Signs BP (!) 172/74   Pulse 67   Temp 98.1 F (36.7 C) (Oral)   Resp 20   Ht 1.829 m (6')   Wt 86 kg   SpO2 100%   BMI 25.71 kg/m   Physical Exam CONSTITUTIONAL: Well developed/well nourished HEAD: Normocephalic/atraumatic EYES: EOMI/PERRL ENMT: Mucous membranes dry NECK: supple no meningeal signs SPINE/BACK:entire spine nontender CV: S1/S2 noted, no murmurs/rubs/gallops noted LUNGS: Lungs are clear to auscultation bilaterally, no apparent distress ABDOMEN: soft, nontender, no rebound or guarding, bowel sounds noted throughout abdomen GU:no cva tenderness NEURO: Pt is awake/alert/appropriate, moves all extremitiesx4.  No facial droop.   EXTREMITIES: pulses normal/equal, full ROM, left knee is bandaged SKIN: warm, color normal PSYCH: no abnormalities of mood noted, alert and oriented to situation   ED Treatments / Results  Labs (all labs  ordered are listed, but only abnormal results are displayed) Labs Reviewed  COMPREHENSIVE METABOLIC PANEL - Abnormal; Notable for the following components:      Result Value   Glucose, Bld 131 (*)    GFR calc non Af Amer 57 (*)    All other components within normal limits  CBC - Abnormal; Notable for the following components:   RBC 4.02 (*)    Hemoglobin 11.9 (*)    HCT 37.8 (*)    All other components within normal limits  LIPASE, BLOOD  TROPONIN I  TSH  TROPONIN I  TROPONIN I  TROPONIN I    EKG EKG Interpretation  Date/Time:  Sunday September 10 2018 01:56:09 EDT Ventricular Rate:  58 PR Interval:    QRS Duration: 91 QT Interval:  401 QTC Calculation: 408 R Axis:   44 Text Interpretation:  Atrial fibrillation Anteroseptal infarct, age indeterminate Confirmed by Ripley Fraise [phone]) on 09/10/2018 2:09:52 AM   Radiology No results found.  Procedures Procedures  Medications Ordered in ED Medications  enoxaparin (LOVENOX) injection 40 mg (has no administration in time range)  0.9 %  sodium chloride infusion (has no administration in time range)  acetaminophen (TYLENOL) tablet 650 mg (has no administration in time range)    Or  acetaminophen (TYLENOL) suppository 650 mg (has no administration in time range)  ondansetron (ZOFRAN) injection 4 mg (4 mg Intravenous Given 09/10/18 0143)  alum & mag hydroxide-simeth (MAALOX/MYLANTA) 200-200-20 MG/5ML suspension 30 mL (30 mLs Oral Given 09/10/18 0308)  ondansetron (ZOFRAN) injection 4 mg (4 mg Intravenous Given 09/10/18 0307)  promethazine (PHENERGAN) injection 12.5 mg (12.5 mg Intravenous Given 09/10/18 0342)  metoCLOPramide (REGLAN) injection 10 mg (10 mg Intravenous Given 09/10/18 0435)  sodium chloride 0.9 % bolus 1,000 mL (1,000 mLs Intravenous New Bag/Given 09/10/18 0437)     Initial Impression / Assessment and Plan / ED Course  I have reviewed the triage vital signs and the nursing notes.  Pertinent labs  results that were available during my care of the patient were reviewed by me and considered in my medical decision making (see  chart for details).     3:03 AM Patient presents with postoperative nausea and vomiting.  He has no active chest or abdominal pain at this time.  No focal abdominal tenderness. Labs thus far reassuring.  EKG is unchanged, reveals chronic A. Fib We will try medications.  This could be due to recent use of pain meds as well as iron 4:04 AM Patient without any relief from Mylanta or Zofran x2. He had another episode of vomiting with a brief episode of bradycardia, and his heart rate improved.  I ordered a half dose of Phenergan, and he is now feeling improved.  He is resting comfortably. 5:34 AM Patient vomited again after the Phenergan.  He also appeared to become somnolent and mildly confused with the Phenergan.  I will note in his allergies not to be given promethazine again He is awake and alert, no facial droop no arm drift.  He is now answering questions appropriately.  He reports continued nausea.  He was given Reglan and still has nausea and vomiting.  Suspicion this episode is due to medications, potentially iron supplementation which just got started.  Patient will be admitted for hydration as well as nausea control he has no focal abdominal tenderness, no other pain complaints.  Discussed with Dr. Maudie Mercury for admission Final Clinical Impressions(s) / ED Diagnoses   Final diagnoses:  Projectile vomiting with nausea  Intractable nausea and vomiting    ED Discharge Orders    None       Ripley Fraise, MD 09/10/18 [phone]

## 2018-09-10 NOTE — Progress Notes (Signed)
09/10/2018 3:37 PM  Update:  Pt feeling better.  He had SMOG enema and had a large bowel movement.  He wants to advance diet.  He is hoping that he can discharge home in the morning.  Will advance to soft diet.  He said that he was taking the iron tablets three times daily.  I advised against that due to his GI side effects.  He is now on peri-colace and miralax for bowel management.  If he is feeling better and tolerating diet hopefully he can go home tomorrow.   He is going to take less of his pain meds and spread them out more.  He is starting outpatient PT for his knee surgery on Tuesday 10/29.  I spoke with patient and his wife, updated them and answered their questions.  They verbalized understanding.    Murvin Natal MD

## 2018-09-10 NOTE — Plan of Care (Signed)
Pt tolerated enema well and had a bowel movement after administration. Wife and patient spoke with MD about concerns. See updated orders.

## 2018-09-10 NOTE — ED Notes (Signed)
ED Provider at bedside. [place]

## 2018-09-10 NOTE — Progress Notes (Signed)
09/10/2018  1:29 AM  09/10/2018 6:38 AM  [name] was seen and examined.  The H&P by the admitting provider, orders, imaging was reviewed.  Please see new orders.  Will continue to follow.   I reviewed his abdominal xrays.  He is very constipated and that needs to be addressed immediately.  I have ordered enema and laxatives.  Clear liquid diet if tolerated.    Murvin Natal, MD [facility]

## 2018-09-10 NOTE — Progress Notes (Signed)
PT had episode of vomiting after drinking Miralax. PT unable to tolerate medication. PT refused N/V medication at this time

## 2018-09-10 NOTE — H&P (Addendum)
TRH H&P   Patient Demographics:    [name] [name], is a 77 y.o. male  MRN: [id number]   DOB - 08/14/1941  Admit Date - 09/10/2018  Outpatient Primary MD for the patient is [name] [name], [name] Nunnery, MD  Referring MD/NP/PA: [place] [name]  Outpatient Specialists:   Patient coming from: home  Chief Complaint  Patient presents with  . Nausea      HPI:    [name] [name]  is a 77 y.o. male, w CAD , Pafib, bladder cancer, osteoarthritis s/p L TKA 09/05/2018 c/o n/v, intractable over the past 2-3 day. Bile like per pt.   Pt denies fever, chills, cough, cp, palp, sob, abd pain, diarrhea, brbpr, black stool, dysuria, hematuria.     In ED,  T 98.1  P 65 Bp 113/84  Pox 97% on RA  Na 139, K 3.8, Bun 22, Creatinine 1.17  Ast 34, Alt 16 Wbc 8.9, Hgb 11.9, Plt 165   Trop <0.03  Pt received zofran, phenergan , reglan in ED without relief. Note that pt has allergy to phenergan ? AMS  Pt will be admitted for intractable n/v.       Review of systems:    In addition to the HPI above,  No Fever-chills, No Headache, No changes with Vision or hearing, No problems swallowing food or Liquids, No Chest pain, Cough or Shortness of Breath, No Abdominal pain,  Bowel movements are regular, No Blood in stool or Urine, No dysuria, No new skin rashes or bruises, No new joints pains-aches,  No new weakness, tingling, numbness in any extremity, No recent weight gain or loss, No polyuria, polydypsia or polyphagia, No significant Mental Stressors.  A full 10 point Review of Systems was done, except as stated above, all other Review of Systems were negative.   With Past History of the following :    Past Medical History:  Diagnosis Date  . Arthritis    shoulders and ribs   . Atrial fibrillation ([place])    atrial fib/ LOV [name] Argue PA 04/11/12 EPIC, - CHEST X RAY, EKG 5/13 EPIC  . CAD (coronary  artery disease)     s/p NSTEMI 04/08;  [place] 02/2007: Proximal LAD 75%, mid LAD 99%, proximal RCA 25%.  PCI:  Cypher DES to the proximal and mid LAD.  Last nuclear study 11/2011 (after a trip to the ED with CP): EF 58%, low risk study with small inferior wall infarct at the mid and basal level, no ischemia.  Last echo 01/2010: Mild LVH, EF 55-60%, mild AI, mild MR, severely dilated LA and RA  . Contrast dye induced nephropathy    Hx ARF secondary to contrast nephropathy  . Dyslipidemia   . Epididymitis   . History of blood transfusion   . Myocardial infarction ([place]) 2008  . Pleurisy    2012  . Pneumonia    hx of   .  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 Spinal stenosis   . 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 [name],  skin cancer non melanoma      Past Surgical History:  Procedure Laterality Date  . arm surgery     orif right elbow  . CARDIAC CATHETERIZATION  2008  . coronary stents     . CYSTECTOMY  07/15/08  . CYSTOSCOPY WITH BIOPSY  03/23/2012   Procedure: CYSTOSCOPY WITH BIOPSY;  Surgeon: [name] Gray, MD;  Location: [facility];  Service: Urology;  Laterality: Right;   BRUSH BIOPSY RIGHT URETERAL STENT  . CYSTOSCOPY WITH URETEROSCOPY  03/23/2012   Procedure: CYSTOSCOPY WITH URETEROSCOPY;  Surgeon: [name] Gray, MD;  Location: [facility];  Service: Urology;  Laterality: Right;    **OR Room #8 requested**  C-ARM   . CYSTOSCOPY/RETROGRADE/URETEROSCOPY  02/15/2012   Procedure: CYSTOSCOPY/RETROGRADE/URETEROSCOPY;  Surgeon: [name] Ben, MD;  Location: [facility];  Service: Urology;  Laterality: Right;  C-ARM  . JOINT REPLACEMENT     Dr. [name] 09-05-18   . left pinky finger      tip removed from accident  . right kidney removed      2013  . TOTAL KNEE ARTHROPLASTY Left 09/05/2018   Procedure: LEFT TOTAL KNEE ARTHROPLASTY;  Surgeon: [name] Cancel, MD;  Location: [facility];  Service: Orthopedics;  Laterality: Left;  70 mins with abductor block  . URETER SURGERY        Social History:     Social History    Tobacco Use  . Smoking status: Former Smoker    Packs/day: 2.00    Years: 10.00    Pack years: 20.00    Types: Cigarettes, Cigars    Last attempt to quit: 11/15/1981    Years since quitting: 36.8  . Smokeless tobacco: Never Used  Substance Use Topics  . Alcohol use: No     Lives - at home  Mobility - walks by self   Family History :     Family History  Problem Relation Age of Onset  . Cancer Mother   . Diabetes type II Mother   . Hyperlipidemia Mother   . Coronary artery disease Father   . Heart attack Father       Home Medications:   Prior to Admission medications   Medication Sig Start Date End Date Taking? Authorizing Provider  aspirin (ASPIRIN CHILDRENS) 81 MG chewable tablet Chew 1 tablet (81 mg total) by mouth 2 (two) times daily. Take for 4 weeks, then resume regular dose. 09/06/18 10/06/18  Danae Orleans, PA-C  atorvastatin (LIPITOR) 40 MG tablet Take 1 tablet (40 mg total) by mouth every evening. 12/01/17   Alycia Rossetti, MD  calcitRIOL (ROCALTROL) 0.5 MCG capsule Take 0.5 mcg by mouth daily.    [provider]  docusate sodium (COLACE) 100 MG capsule Take 1 capsule (100 mg total) by mouth 2 (two) times daily. 09/05/18   Danae Orleans, PA-C  ferrous sulfate (FERROUSUL) 325 (65 FE) MG tablet Take 1 tablet (325 mg total) by mouth 3 (three) times daily with meals. 09/05/18   Danae Orleans, PA-C  furosemide (LASIX) 40 MG tablet Take 1 tablet (40 mg total) by mouth daily. 12/01/17   [name] [name], [name] Nunnery, MD  HYDROcodone-acetaminophen (NORCO) 7.5-325 MG tablet Take 1-2 tablets by mouth every 4 (four) hours as needed for moderate pain. 09/05/18   Danae Orleans, PA-C  HYDROcodone-acetaminophen (NORCO) 7.5-325 MG tablet Take 1-2 tablets by mouth every 4 (four) hours as  needed for moderate pain. 09/11/18   Danae Orleans, PA-C  Inositol Niacinate (NIACIN FLUSH FREE) 500 MG CAPS Take 500 mg by mouth daily.     [provider]  methocarbamol  (ROBAXIN) 500 MG tablet Take 1 tablet (500 mg total) by mouth every 6 (six) hours as needed for muscle spasms. 09/05/18   Danae Orleans, PA-C  NITROSTAT 0.4 MG SL tablet Place 1 tablet (0.4 mg total) under the tongue every 5 (five) minutes as needed for chest pain. 09/18/15   [name] Serge, NP  Omega-3 Fatty Acids (FISH OIL) 1200 MG CAPS Take 1,200 mg by mouth daily.     [provider]  polyethylene glycol (MIRALAX / GLYCOLAX) packet Take 17 g by mouth 2 (two) times daily. 09/05/18   Danae Orleans, PA-C  vitamin B-12 (CYANOCOBALAMIN) 500 MCG tablet Take 500 mcg by mouth daily.    [provider]     Allergies:     Allergies  Allergen Reactions  . Contrast Media [Iodinated Diagnostic Agents]     Contrast-induced nephropathy requiring dialysis in 02/2007.  . Iodine-131 Other (See Comments)    Kidneys shut down  . Other Other (See Comments)    Beta blockers cause bradycardia  . Promethazine Other (See Comments)    Pt became drowsy and mild altered mental status     Physical Exam:   Vitals  Blood pressure 113/84, pulse 65, temperature 98.1 F (36.7 C), temperature source Oral, resp. rate 19, height 6' (1.829 m), weight 86 kg, SpO2 97 %.   1. General  lying in bed in NAD,    2. Normal affect and insight, Not Suicidal or Homicidal, Awake Alert, Oriented X 3.  3. No F.N deficits, ALL C.Nerves Intact, Strength 5/5 all 4 extremities, Sensation intact all 4 extremities, Plantars down going.  4. Ears and Eyes appear Normal, Conjunctivae clear, PERRLA. Moist Oral Mucosa.  5. Supple Neck, No JVD, No cervical lymphadenopathy appriciated, No Carotid Bruits.  6. Symmetrical Chest wall movement, Good air movement bilaterally, CTAB.  7. RRR, No Gallops, Rubs or Murmurs, No Parasternal Heave.  8. Positive Bowel Sounds, Abdomen Soft, No tenderness, No organomegaly appriciated,No rebound -guarding or rigidity.  9.  No Cyanosis, Normal Skin Turgor, No Skin Rash or  Bruise.  10. Good muscle tone,  joints appear normal , no effusions, Normal ROM.  11. No Palpable Lymph Nodes in Neck or Axillae     Data Review:    CBC Recent Labs  Lab 09/06/18 0506 09/10/18 0144  WBC 12.0* 8.9  HGB 12.1* 11.9*  HCT 37.4* 37.8*  PLT 139* 165  MCV 95.7 94.0  MCH 30.9 29.6  MCHC 32.4 31.5  RDW 12.9 13.3   ------------------------------------------------------------------------------------------------------------------  Chemistries  Recent Labs  Lab 09/06/18 0506 09/10/18 0144  NA 137 139  K 4.0 3.8  CL 102 103  CO2 24 26  GLUCOSE 140* 131*  BUN 20 22  CREATININE 1.16 1.17  CALCIUM 8.0* 8.9  AST  --  34  ALT  --  16  ALKPHOS  --  45  BILITOT  --  1.2   ------------------------------------------------------------------------------------------------------------------ estimated creatinine clearance is 56.2 mL/min (by C-G formula based on SCr of 1.17 mg/dL). ------------------------------------------------------------------------------------------------------------------ No results for input(s): TSH, T4TOTAL, T3FREE, THYROIDAB in the last 72 hours.  Invalid input(s): FREET3  Coagulation profile No results for input(s): INR, PROTIME in the last 168 hours. ------------------------------------------------------------------------------------------------------------------- No results for input(s): DDIMER in the last 72 hours. -------------------------------------------------------------------------------------------------------------------  Cardiac Enzymes Recent  Labs  Lab 09/10/18 0324  TROPONINI <0.03   ------------------------------------------------------------------------------------------------------------------ No results found for: BNP   ---------------------------------------------------------------------------------------------------------------  Urinalysis    Component Value Date/Time   COLORURINE YELLOW 08/17/2018 1402    APPEARANCEUR CLEAR 08/17/2018 1402   LABSPEC 1.021 08/17/2018 1402   PHURINE 6.0 08/17/2018 1402   GLUCOSEU NEGATIVE 08/17/2018 1402   HGBUR NEGATIVE 08/17/2018 1402   KETONESUR NEGATIVE 08/17/2018 1402   PROTEINUR NEGATIVE 08/17/2018 1402   NITRITE NEGATIVE 08/17/2018 1402   LEUKOCYTESUR NEGATIVE 08/17/2018 1402    ----------------------------------------------------------------------------------------------------------------   Imaging Results:    No results found.     Assessment & Plan:    Active Problems:   CAD, NATIVE VESSEL   Atrial fibrillation (HCC)   Nausea & vomiting    N/v intractable NPO,  Ns iv Abd xray zofran 4mg  iv q6h prn  reglan 5mg  iv q6h prn protonix 40mg  po qday STOP Norco Try Tramadol 50mg  po q6h prn pain  Pafib Cont aspirin Pt was formerly on Xarelto, didn't want to take it.   CAD Cont Aspirin Cont Lipitor 40mg  po qhs Lasix 40mg  po qday  Anemia Cont b12 HOLD iron temporarily      DVT Prophylaxis-  Lovenox - SCDs  AM Labs Ordered, also please review Full Orders  Family Communication: Admission, patients condition and plan of care including tests being ordered have been discussed with the patient who indicate understanding and agree with the plan and Code Status.  Code Status  FULL CODE  Likely DC to  home  Condition GUARDED    Consults called:  None, if n/v not improving consider GI consultation  Admission status: observation, pt requires hospitalization due to intractable n/v, which per RN notes cause some bradycardia to 30's ?,  Time spent in minutes : 55   Jani Gravel M.D on 09/10/2018 at 5:27 AM  Between 7am to 7pm - Pager - [phone]  . After 7pm go to [email or web address] - password [email or web address]  [facility] - Office  [phone]

## 2018-09-10 NOTE — ED Triage Notes (Signed)
Pt had knee surgery this past Tuesday. Nausea and vomiting today.  Has been nauseated since the surgery.  Pt vomiting during triage

## 2018-09-10 NOTE — ED Notes (Signed)
Pt HR decreased to 34 bpm, right after pt vomited, HR came back up to 60's after vomiting. Dr Christy Gentles aware.

## 2018-09-11 DIAGNOSIS — I2510 Atherosclerotic heart disease of native coronary artery without angina pectoris: Secondary | ICD-10-CM | POA: Diagnosis not present

## 2018-09-11 DIAGNOSIS — N183 Chronic kidney disease, stage 3 (moderate): Secondary | ICD-10-CM

## 2018-09-11 DIAGNOSIS — R112 Nausea with vomiting, unspecified: Secondary | ICD-10-CM | POA: Diagnosis not present

## 2018-09-11 DIAGNOSIS — K5900 Constipation, unspecified: Secondary | ICD-10-CM | POA: Diagnosis not present

## 2018-09-11 DIAGNOSIS — I4811 Longstanding persistent atrial fibrillation: Secondary | ICD-10-CM | POA: Diagnosis not present

## 2018-09-11 LAB — COMPREHENSIVE METABOLIC PANEL
ALK PHOS: 35 U/L — AB (ref 38–126)
ALT: 13 U/L (ref 0–44)
AST: 22 U/L (ref 15–41)
Albumin: 3.1 g/dL — ABNORMAL LOW (ref 3.5–5.0)
Anion gap: 9 (ref 5–15)
BILIRUBIN TOTAL: 1 mg/dL (ref 0.3–1.2)
BUN: 22 mg/dL (ref 8–23)
CALCIUM: 8.2 mg/dL — AB (ref 8.9–10.3)
CHLORIDE: 104 mmol/L (ref 98–111)
CO2: 25 mmol/L (ref 22–32)
CREATININE: 1.12 mg/dL (ref 0.61–1.24)
Glucose, Bld: 118 mg/dL — ABNORMAL HIGH (ref 70–99)
Potassium: 4 mmol/L (ref 3.5–5.1)
Sodium: 138 mmol/L (ref 135–145)
Total Protein: 5.8 g/dL — ABNORMAL LOW (ref 6.5–8.1)

## 2018-09-11 LAB — CBC
HEMATOCRIT: 32.6 % — AB (ref 39.0–52.0)
Hemoglobin: 10.3 g/dL — ABNORMAL LOW (ref 13.0–17.0)
MCH: 30.3 pg (ref 26.0–34.0)
MCHC: 31.6 g/dL (ref 30.0–36.0)
MCV: 95.9 fL (ref 80.0–100.0)
NRBC: 0 % (ref 0.0–0.2)
PLATELETS: 154 10*3/uL (ref 150–400)
RBC: 3.4 MIL/uL — ABNORMAL LOW (ref 4.22–5.81)
RDW: 13.2 % (ref 11.5–15.5)
WBC: 9.4 10*3/uL (ref 4.0–10.5)

## 2018-09-11 LAB — MAGNESIUM: Magnesium: 2.1 mg/dL (ref 1.7–2.4)

## 2018-09-11 MED ORDER — NIACIN ER (ANTIHYPERLIPIDEMIC) 500 MG PO TBCR
500.0000 mg | EXTENDED_RELEASE_TABLET | Freq: Every day | ORAL | Status: DC
  Filled 2018-09-11 (×2): qty 1

## 2018-09-11 MED ORDER — FERROUS SULFATE 325 (65 FE) MG PO TABS: 325.0000 mg | ORAL_TABLET | Freq: Every day | ORAL | 3 refills | Status: DC

## 2018-09-11 MED ORDER — FLEET ENEMA 7-19 GM/118ML RE ENEM: 1.0000 | ENEMA | Freq: Every day | RECTAL | 0 refills | Status: DC | PRN

## 2018-09-11 MED ORDER — SORBITOL 70 % SOLN
960.0000 mL | TOPICAL_OIL | Freq: Once | ORAL | Status: AC
  Administered 2018-09-11: 960 mL via RECTAL
  Filled 2018-09-11: qty 473

## 2018-09-11 MED ORDER — POLYETHYLENE GLYCOL 3350 17 G PO PACK: 17.0000 g | PACK | Freq: Two times a day (BID) | ORAL | 0 refills | Status: DC

## 2018-09-11 NOTE — Progress Notes (Signed)
Patient given discharge instructions at bedside, wife at bedside.

## 2018-09-11 NOTE — Discharge Summary (Signed)
Physician Discharge Summary  [name] [name] GXQ:[id number] DOB: [birth year] DOA: 09/10/2018  PCP: Alycia Rossetti, MD  Admit date: 09/10/2018 Discharge date: 09/11/2018  Admitted From: Home Disposition: Home  Recommendations for Outpatient Follow-up:  1. Follow up with PCP in 1-2 weeks 2. Please obtain BMP/CBC in one week  Discharge Condition: Stable CODE STATUS: Full code Diet recommendation: Heart healthy  Brief/Interim Summary: 77 year old male with a history of coronary artery disease, paroxysmal atrial fibrillation, bladder cancer, who recently underwent total knee replacement on 10/22, presents to the hospital with complaints of nausea and vomiting.  His abdominal x-ray showed significant constipation.  He was noted to be mildly dehydrated.  The patient received antiemetics, IV fluids and was started on a bowel regimen.  He received multiple enemas which led to significant bowel movements.  With these bowel movements, his symptoms did improve.  Nausea and vomiting has since resolved.  He will be continued on MiraLAX to help promote regular bowel movements.  He is been advised to stay mobile and use narcotic medication sparingly.  He is tolerating a solid diet and feels ready for discharge home.  Discharge Diagnoses:  Active Problems:   CAD (coronary artery disease)   Atrial fibrillation (HCC)   CKD (chronic kidney disease), stage III (HCC)   GERD (gastroesophageal reflux disease)   H/O right nephrectomy   S/P left TKA   Nausea & vomiting   N&V (nausea and vomiting)   Constipation    Discharge Instructions  Discharge Instructions    Diet - low sodium heart healthy   Complete by:  As directed    Increase activity slowly   Complete by:  As directed      Allergies as of 09/11/2018      Reactions   Contrast Media [iodinated Diagnostic Agents]    Contrast-induced nephropathy requiring dialysis in 02/2007.   Iodine-131 Other (See Comments)   Kidneys shut down   Other  Other (See Comments)   Beta blockers cause bradycardia   Promethazine Other (See Comments)   Pt became drowsy and mild altered mental status      Medication List    TAKE these medications   aspirin 81 MG chewable tablet Chew 1 tablet (81 mg total) by mouth 2 (two) times daily. Take for 4 weeks, then resume regular dose.   atorvastatin 40 MG tablet Commonly known as:  LIPITOR Take 1 tablet (40 mg total) by mouth every evening.   calcitRIOL 0.5 MCG capsule Commonly known as:  ROCALTROL Take 0.5 mcg by mouth daily.   docusate sodium 100 MG capsule Commonly known as:  COLACE Take 1 capsule (100 mg total) by mouth 2 (two) times daily.   ferrous sulfate 325 (65 FE) MG tablet Take 1 tablet (325 mg total) by mouth daily with breakfast. What changed:  when to take this   Fish Oil 1200 MG Caps Take 1,200 mg by mouth daily.   furosemide 40 MG tablet Commonly known as:  LASIX Take 1 tablet (40 mg total) by mouth daily.   HYDROcodone-acetaminophen 7.5-325 MG tablet Commonly known as:  NORCO Take 1-2 tablets by mouth every 4 (four) hours as needed for moderate pain.   methocarbamol 500 MG tablet Commonly known as:  ROBAXIN Take 1 tablet (500 mg total) by mouth every 6 (six) hours as needed for muscle spasms.   NIACIN FLUSH FREE 500 MG Caps Generic drug:  Inositol Niacinate Take 500 mg by mouth daily.   NITROSTAT 0.4 MG SL tablet Generic drug:  nitroGLYCERIN Place 1 tablet (0.4 mg total) under the tongue every 5 (five) minutes as needed for chest pain.   polyethylene glycol packet Commonly known as:  MIRALAX / GLYCOLAX Take 17 g by mouth 2 (two) times daily.   sodium phosphate 7-19 GM/118ML Enem Place 133 mLs (1 enema total) rectally daily as needed for severe constipation.   vitamin B-12 500 MCG tablet Commonly known as:  CYANOCOBALAMIN Take 500 mcg by mouth daily.       Allergies  Allergen Reactions  . Contrast Media [Iodinated Diagnostic Agents]      Contrast-induced nephropathy requiring dialysis in 02/2007.  . Iodine-131 Other (See Comments)    Kidneys shut down  . Other Other (See Comments)    Beta blockers cause bradycardia  . Promethazine Other (See Comments)    Pt became drowsy and mild altered mental status    Consultations:     Procedures/Studies: Dg Abd Portable 2v  Result Date: 09/10/2018 CLINICAL DATA:  76 year old male with nausea vomiting. EXAM: PORTABLE ABDOMEN - 2 VIEW COMPARISON:  CT of the abdomen pelvis dated 09/10/2016 FINDINGS: There is moderate stool throughout the colon. No bowel dilatation or evidence of obstruction. Multiple gallstones noted. No free air. Degenerative changes of the spine. IMPRESSION: 1. Constipation.  No evidence of bowel obstruction. 2. Cholelithiasis. Electronically Signed   By: Anner Crete M.D.   On: 09/10/2018 06:29       Subjective: Patient is feeling better.  Had good bowel movements after receiving enema.  No vomiting.  Tolerating oral intake.  Discharge Exam: Vitals:   09/10/18 2159 09/11/18 0700 09/11/18 0715 09/11/18 1301  BP: (!) 150/68  (!) 159/62 (!) 155/57  Pulse: (!) 49  (!) 46 (!) 48  Resp: 18  18 18   Temp: 98.6 F (37 C)  98.4 F (36.9 C) 98.1 F (36.7 C)  TempSrc: Oral  Oral Oral  SpO2: 99%  99% 98%  Weight:  91.7 kg    Height:        General: Pt is alert, awake, not in acute distress Cardiovascular: RRR, S1/S2 +, no rubs, no gallops Respiratory: CTA bilaterally, no wheezing, no rhonchi Abdominal: Soft, NT, ND, bowel sounds + Extremities: no edema, no cyanosis    The results of significant diagnostics from this hospitalization (including imaging, microbiology, ancillary and laboratory) are listed below for reference.     Microbiology: No results found for this or any previous visit (from the past 240 hour(s)).   Labs: BNP (last 3 results) No results for input(s): BNP in the last 8760 hours. Basic Metabolic Panel: Recent Labs  Lab  09/06/18 0506 09/10/18 0144 09/11/18 0613  NA 137 139 138  K 4.0 3.8 4.0  CL 102 103 104  CO2 24 26 25   GLUCOSE 140* 131* 118*  BUN 20 22 22   CREATININE 1.16 1.17 1.12  CALCIUM 8.0* 8.9 8.2*  MG  --   --  2.1   Liver Function Tests: Recent Labs  Lab 09/10/18 0144 09/11/18 0613  AST 34 22  ALT 16 13  ALKPHOS 45 35*  BILITOT 1.2 1.0  PROT 6.9 5.8*  ALBUMIN 3.7 3.1*   Recent Labs  Lab 09/10/18 0144  LIPASE 43   No results for input(s): AMMONIA in the last 168 hours. CBC: Recent Labs  Lab 09/06/18 0506 09/10/18 0144 09/11/18 0613  WBC 12.0* 8.9 9.4  HGB 12.1* 11.9* 10.3*  HCT 37.4* 37.8* 32.6*  MCV 95.7 94.0 95.9  PLT 139* 165 154  Cardiac Enzymes: Recent Labs  Lab 09/10/18 0324 09/10/18 0538 09/10/18 1148  TROPONINI <0.03 0.03* 0.04*   BNP: Invalid input(s): POCBNP CBG: No results for input(s): GLUCAP in the last 168 hours. D-Dimer No results for input(s): DDIMER in the last 72 hours. Hgb A1c No results for input(s): HGBA1C in the last 72 hours. Lipid Profile No results for input(s): CHOL, HDL, LDLCALC, TRIG, CHOLHDL, LDLDIRECT in the last 72 hours. Thyroid function studies Recent Labs    09/10/18 0538  TSH 3.185   Anemia work up No results for input(s): VITAMINB12, FOLATE, FERRITIN, TIBC, IRON, RETICCTPCT in the last 72 hours. Urinalysis    Component Value Date/Time   COLORURINE YELLOW 08/17/2018 1402   APPEARANCEUR CLEAR 08/17/2018 1402   LABSPEC 1.021 08/17/2018 1402   PHURINE 6.0 08/17/2018 1402   GLUCOSEU NEGATIVE 08/17/2018 1402   HGBUR NEGATIVE 08/17/2018 1402   KETONESUR NEGATIVE 08/17/2018 1402   PROTEINUR NEGATIVE 08/17/2018 1402   NITRITE NEGATIVE 08/17/2018 1402   LEUKOCYTESUR NEGATIVE 08/17/2018 1402   Sepsis Labs Invalid input(s): PROCALCITONIN,  WBC,  LACTICIDVEN Microbiology No results found for this or any previous visit (from the past 240 hour(s)).   Time coordinating discharge: 53mins  SIGNED:   Kathie Dike, MD  [facility] 09/11/2018, 7:40 PM Pager   If 7PM-7AM, please contact night-coverage [email or web address] Password [email or web address]

## 2018-09-11 NOTE — Discharge Summary (Signed)
Physician Discharge Summary  Patient ID: [name] [name] MRN: [id number] DOB/AGE: 79-May-1942 79 y.o.  Admit date: 09/05/2018 Discharge date: 09/06/2018   Procedures:  Procedure(s) (LRB): LEFT TOTAL KNEE ARTHROPLASTY (Left)  Attending Physician:  Dr. [name]   Admission Diagnoses:   Left knee primary OA / pain  Discharge Diagnoses:  Principal Problem:   S/P left TKA Active Problems:   Overweight (BMI 25.0-29.9)  Past Medical History:  Diagnosis Date  . Arthritis    shoulders and ribs   . Atrial fibrillation ([name] [name])    atrial fib/ LOV [name] PA 04/11/12 EPIC, - CHEST X RAY, EKG 5/13 EPIC  . CAD (coronary artery disease)     s/p NSTEMI 04/08;  [place] 02/2007: Proximal LAD 75%, mid LAD 99%, proximal RCA 25%.  PCI:  Cypher DES to the proximal and mid LAD.  Last nuclear study 11/2011 (after a trip to the ED with CP): EF 58%, low risk study with small inferior wall infarct at the mid and basal level, no ischemia.  Last echo 01/2010: Mild LVH, EF 55-60%, mild AI, mild MR, severely dilated LA and RA  . Contrast dye induced nephropathy    Hx ARF secondary to contrast nephropathy  . Dyslipidemia   . Epididymitis   . History of blood transfusion   . Myocardial infarction ([place]) 2008  . Pleurisy    2012  . Pneumonia    hx of   . 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 Spinal stenosis   . 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 [name],  skin cancer non melanoma    HPI:    [name] [name], 77 y.o. male, has a history of pain and functional disability in the left knee due to arthritis and has failed non-surgical conservative treatments for greater than 12 weeks to include NSAID's and/or analgesics, corticosteriod injections and activity modification.  Onset of symptoms was gradual, starting 1+ years ago with gradually worsening course since that time. The patient noted no past surgery on the left knee(s).  Patient currently rates pain in the left knee(s) at 10 out of 10 with activity. Patient has worsening of pain  with activity and weight bearing, pain that interferes with activities of daily living, pain with passive range of motion, crepitus and joint swelling.  Patient has evidence of periarticular osteophytes and joint space narrowing by imaging studies. There is no active infection.   Risks, benefits and expectations were discussed with the patient.  Risks including but not limited to the risk of anesthesia, blood clots, nerve damage, blood vessel damage, failure of the prosthesis, infection and up to and including death.  Patient understand the risks, benefits and expectations and wishes to proceed with surgery.   PCP: Alycia Rossetti, MD   Discharged Condition: good  Hospital Course:  Patient underwent the above stated procedure on 09/05/2018. Patient tolerated the procedure well and brought to the recovery room in good condition and subsequently to the floor.  POD #1 BP: 114/62 ; Pulse: 52 ; Temp: 97.8 F (36.6 C) ; Resp: 16 Patient reports pain as mild, pain controlled. No events throughout the night.  Discussed the possibility of proceeding with the other knee at some point, once he is healed.   Ready to be discharged home. Dorsiflexion/plantar flexion intact, incision: dressing C/D/I, no cellulitis present and compartment soft.   LABS  Basename    HGB     12.1  HCT     37.4  Discharge Exam: General appearance: alert, cooperative and no distress Extremities: Homans sign is negative, no sign of DVT, no edema, redness or tenderness in the calves or thighs and no ulcers, gangrene or trophic changes  Disposition:  Home with follow up in 2 weeks   Follow-up Information    [name], MD. Schedule an appointment as soon as possible for a visit in 2 weeks.   Specialty:  Orthopedic Surgery Contact information: [address] [place] [address] [phone]           Discharge Instructions    Call MD / Call 911   Complete by:  As directed    If you experience  chest pain or shortness of breath, CALL 911 and be transported to the hospital emergency room.  If you develope a fever above 101 F, pus (white drainage) or increased drainage or redness at the wound, or calf pain, call your surgeon's office.   Change dressing   Complete by:  As directed    Maintain surgical dressing until follow up in the clinic. If the edges start to pull up, may reinforce with tape. If the dressing is no longer working, may remove and cover with gauze and tape, but must keep the area dry and clean.  Call with any questions or concerns.   Constipation Prevention   Complete by:  As directed    Drink plenty of fluids.  Prune juice may be helpful.  You may use a stool softener, such as Colace (over the counter) 100 mg twice a day.  Use MiraLax (over the counter) for constipation as needed.   Diet - low sodium heart healthy   Complete by:  As directed    Discharge instructions   Complete by:  As directed    Maintain surgical dressing until follow up in the clinic. If the edges start to pull up, may reinforce with tape. If the dressing is no longer working, may remove and cover with gauze and tape, but must keep the area dry and clean.  Follow [facility] in 2 weeks at [facility]. Call with any questions or concerns.   Increase activity slowly as tolerated   Complete by:  As directed    Weight bearing as tolerated with assist device (walker, cane, etc) as directed, use it as long as suggested by your surgeon or therapist, typically at least 4-6 weeks.   TED hose   Complete by:  As directed    Use stockings (TED hose) for 2 weeks on both leg(s).  You may remove them at night for sleeping.      Allergies as of 09/06/2018      Reactions   Contrast Media [iodinated Diagnostic Agents]    Contrast-induced nephropathy requiring dialysis in 02/2007.   Iodine-131 Other (See Comments)   Kidneys shut down   Other Other (See Comments)   Beta blockers cause bradycardia        Medication List    STOP taking these medications   acetaminophen 650 MG CR tablet Commonly known as:  TYLENOL   aspirin EC 81 MG tablet Replaced by:  aspirin 81 MG chewable tablet     TAKE these medications   aspirin 81 MG chewable tablet Chew 1 tablet (81 mg total) by mouth 2 (two) times daily. Take for 4 weeks, then resume regular dose. Replaces:  aspirin EC 81 MG tablet   atorvastatin 40 MG tablet Commonly known as:  LIPITOR Take 1 tablet (40 mg total) by  mouth every evening.   calcitRIOL 0.5 MCG capsule Commonly known as:  ROCALTROL Take 0.5 mcg by mouth daily.   docusate sodium 100 MG capsule Commonly known as:  COLACE Take 1 capsule (100 mg total) by mouth 2 (two) times daily.   ferrous sulfate 325 (65 FE) MG tablet Take 1 tablet (325 mg total) by mouth 3 (three) times daily with meals.   Fish Oil 1200 MG Caps Take 1,200 mg by mouth daily.   furosemide 40 MG tablet Commonly known as:  LASIX Take 1 tablet (40 mg total) by mouth daily.   HYDROcodone-acetaminophen 7.5-325 MG tablet Commonly known as:  NORCO Take 1-2 tablets by mouth every 4 (four) hours as needed for moderate pain.   methocarbamol 500 MG tablet Commonly known as:  ROBAXIN Take 1 tablet (500 mg total) by mouth every 6 (six) hours as needed for muscle spasms.   NIACIN FLUSH FREE 500 MG Caps Generic drug:  Inositol Niacinate Take 500 mg by mouth daily.   NITROSTAT 0.4 MG SL tablet Generic drug:  nitroGLYCERIN Place 1 tablet (0.4 mg total) under the tongue every 5 (five) minutes as needed for chest pain.   vitamin B-12 500 MCG tablet Commonly known as:  CYANOCOBALAMIN Take 500 mcg by mouth daily.            Discharge Care Instructions  (From admission, onward)         Start     Ordered   09/06/18 0000  Change dressing    Comments:  Maintain surgical dressing until follow up in the clinic. If the edges start to pull up, may reinforce with tape. If the dressing is no longer working,  may remove and cover with gauze and tape, but must keep the area dry and clean.  Call with any questions or concerns.   09/06/18 0803           Signed: [place]. Enrique Weiss   PA-C  09/11/2018, 1:16 PM

## 2018-09-11 NOTE — Care Management Obs Status (Signed)
[place] NOTIFICATION   Patient Details  Name: [name] [name] MRN: [id number] Date of Birth: [birth year]  Medicare Observation Status Notification Given:  No  PT. Refuse to sign.    [name] 09/11/2018, 10:41 AM

## 2018-09-12 ENCOUNTER — Emergency Department (HOSPITAL_COMMUNITY): Payer: Medicare HMO

## 2018-09-12 ENCOUNTER — Other Ambulatory Visit: Payer: Self-pay

## 2018-09-12 ENCOUNTER — Observation Stay (HOSPITAL_COMMUNITY)
Admission: EM | Admit: 2018-09-12 | Discharge: 2018-09-13 | Disposition: A | Discharge status: Home / Self Care | Payer: Medicare HMO | Attending: Internal Medicine | Admitting: Internal Medicine

## 2018-09-12 ENCOUNTER — Encounter (HOSPITAL_COMMUNITY): Payer: Self-pay | Admitting: Emergency Medicine

## 2018-09-12 DIAGNOSIS — K5900 Constipation, unspecified: Secondary | ICD-10-CM | POA: Insufficient documentation

## 2018-09-12 DIAGNOSIS — R0789 Other chest pain: Secondary | ICD-10-CM | POA: Diagnosis not present

## 2018-09-12 DIAGNOSIS — I495 Sick sinus syndrome: Secondary | ICD-10-CM | POA: Diagnosis present

## 2018-09-12 DIAGNOSIS — Z87891 Personal history of nicotine dependence: Secondary | ICD-10-CM | POA: Insufficient documentation

## 2018-09-12 DIAGNOSIS — R079 Chest pain, unspecified: Secondary | ICD-10-CM | POA: Diagnosis not present

## 2018-09-12 DIAGNOSIS — K219 Gastro-esophageal reflux disease without esophagitis: Secondary | ICD-10-CM | POA: Diagnosis present

## 2018-09-12 DIAGNOSIS — N183 Chronic kidney disease, stage 3 unspecified: Secondary | ICD-10-CM | POA: Diagnosis present

## 2018-09-12 DIAGNOSIS — Z96652 Presence of left artificial knee joint: Secondary | ICD-10-CM | POA: Insufficient documentation

## 2018-09-12 DIAGNOSIS — I2510 Atherosclerotic heart disease of native coronary artery without angina pectoris: Secondary | ICD-10-CM | POA: Diagnosis present

## 2018-09-12 DIAGNOSIS — I4891 Unspecified atrial fibrillation: Secondary | ICD-10-CM | POA: Diagnosis not present

## 2018-09-12 DIAGNOSIS — Z79899 Other long term (current) drug therapy: Secondary | ICD-10-CM | POA: Insufficient documentation

## 2018-09-12 DIAGNOSIS — J449 Chronic obstructive pulmonary disease, unspecified: Secondary | ICD-10-CM | POA: Diagnosis not present

## 2018-09-12 LAB — TROPONIN I
TROPONIN I: 0.03 ng/mL — AB (ref <0.03)
Troponin I: 0.04 ng/mL (ref <0.03)
Troponin I: 0.03 ng/mL (ref <0.03)

## 2018-09-12 LAB — CBC WITH DIFFERENTIAL/PLATELET
ABS IMMATURE GRANULOCYTES: 0.08 10*3/uL — AB (ref 0.00–0.07)
BASOS PCT: 0 %
Basophils Absolute: 0 10*3/uL (ref 0.0–0.1)
EOS ABS: 0 10*3/uL (ref 0.0–0.5)
Eosinophils Relative: 0 %
HCT: 33.3 % — ABNORMAL LOW (ref 39.0–52.0)
Hemoglobin: 10.7 g/dL — ABNORMAL LOW (ref 13.0–17.0)
IMMATURE GRANULOCYTES: 1 %
LYMPHS PCT: 8 %
Lymphs Abs: 0.7 10*3/uL (ref 0.7–4.0)
MCH: 30.3 pg (ref 26.0–34.0)
MCHC: 32.1 g/dL (ref 30.0–36.0)
MCV: 94.3 fL (ref 80.0–100.0)
Monocytes Absolute: 1.2 10*3/uL — ABNORMAL HIGH (ref 0.1–1.0)
Monocytes Relative: 12 %
NEUTROS ABS: 7.5 10*3/uL (ref 1.7–7.7)
Neutrophils Relative %: 79 %
PLATELETS: 170 10*3/uL (ref 150–400)
RBC: 3.53 MIL/uL — AB (ref 4.22–5.81)
RDW: 13.2 % (ref 11.5–15.5)
WBC: 9.6 10*3/uL (ref 4.0–10.5)
nRBC: 0 % (ref 0.0–0.2)

## 2018-09-12 LAB — COMPREHENSIVE METABOLIC PANEL
ALT: 14 U/L (ref 0–44)
AST: 25 U/L (ref 15–41)
Albumin: 3.4 g/dL — ABNORMAL LOW (ref 3.5–5.0)
Alkaline Phosphatase: 44 U/L (ref 38–126)
Anion gap: 8 (ref 5–15)
BUN: 23 mg/dL (ref 8–23)
CHLORIDE: 104 mmol/L (ref 98–111)
CO2: 26 mmol/L (ref 22–32)
CREATININE: 1.06 mg/dL (ref 0.61–1.24)
Calcium: 8.4 mg/dL — ABNORMAL LOW (ref 8.9–10.3)
GFR calc Af Amer: >60 mL/min (ref >60)
GFR calc non Af Amer: >60 mL/min (ref >60)
Glucose, Bld: 118 mg/dL — ABNORMAL HIGH (ref 70–99)
POTASSIUM: 4 mmol/L (ref 3.5–5.1)
SODIUM: 138 mmol/L (ref 135–145)
Total Bilirubin: 1.4 mg/dL — ABNORMAL HIGH (ref 0.3–1.2)
Total Protein: 6.2 g/dL — ABNORMAL LOW (ref 6.5–8.1)

## 2018-09-12 MED ORDER — CALCITRIOL 0.25 MCG PO CAPS
0.5000 ug | ORAL_CAPSULE | Freq: Every day | ORAL | Status: DC
  Administered 2018-09-12 – 2018-09-13 (×2): 0.5 ug via ORAL
  Filled 2018-09-12: qty 2
  Filled 2018-09-12 (×2): qty 1

## 2018-09-12 MED ORDER — SODIUM CHLORIDE 0.9 % IV SOLN: 250.0000 mL | INTRAVENOUS | Status: DC | PRN

## 2018-09-12 MED ORDER — ACETAMINOPHEN 325 MG PO TABS: 650.0000 mg | ORAL_TABLET | Freq: Four times a day (QID) | ORAL | Status: DC | PRN

## 2018-09-12 MED ORDER — SODIUM CHLORIDE 0.9% FLUSH: 3.0000 mL | INTRAVENOUS | Status: DC | PRN

## 2018-09-12 MED ORDER — ENOXAPARIN SODIUM 40 MG/0.4ML ~~LOC~~ SOLN
40.0000 mg | SUBCUTANEOUS | Status: DC
  Administered 2018-09-12: 40 mg via SUBCUTANEOUS
  Filled 2018-09-12: qty 0.4

## 2018-09-12 MED ORDER — ACETAMINOPHEN 650 MG RE SUPP: 650.0000 mg | Freq: Four times a day (QID) | RECTAL | Status: DC | PRN

## 2018-09-12 MED ORDER — PANTOPRAZOLE SODIUM 40 MG PO TBEC
40.0000 mg | DELAYED_RELEASE_TABLET | Freq: Every day | ORAL | Status: DC
  Administered 2018-09-12 – 2018-09-13 (×2): 40 mg via ORAL
  Filled 2018-09-12 (×2): qty 1

## 2018-09-12 MED ORDER — DOCUSATE SODIUM 100 MG PO CAPS
100.0000 mg | ORAL_CAPSULE | Freq: Two times a day (BID) | ORAL | Status: DC
  Administered 2018-09-12 – 2018-09-13 (×2): 100 mg via ORAL
  Filled 2018-09-12 (×3): qty 1

## 2018-09-12 MED ORDER — ASPIRIN 81 MG PO CHEW
81.0000 mg | CHEWABLE_TABLET | Freq: Two times a day (BID) | ORAL | Status: DC
  Administered 2018-09-12 – 2018-09-13 (×3): 81 mg via ORAL
  Filled 2018-09-12 (×3): qty 1

## 2018-09-12 MED ORDER — SODIUM CHLORIDE 0.9% FLUSH
3.0000 mL | Freq: Two times a day (BID) | INTRAVENOUS | Status: DC
  Administered 2018-09-12 – 2018-09-13 (×3): 3 mL via INTRAVENOUS

## 2018-09-12 MED ORDER — POLYETHYLENE GLYCOL 3350 17 G PO PACK
17.0000 g | PACK | Freq: Two times a day (BID) | ORAL | Status: DC
  Administered 2018-09-13: 17 g via ORAL
  Filled 2018-09-12 (×2): qty 1

## 2018-09-12 MED ORDER — FUROSEMIDE 40 MG PO TABS
40.0000 mg | ORAL_TABLET | Freq: Every day | ORAL | Status: DC
  Administered 2018-09-12 – 2018-09-13 (×2): 40 mg via ORAL
  Filled 2018-09-12 (×2): qty 1

## 2018-09-12 MED ORDER — ONDANSETRON HCL 4 MG/2ML IJ SOLN: 4.0000 mg | Freq: Four times a day (QID) | INTRAMUSCULAR | Status: DC | PRN

## 2018-09-12 MED ORDER — ONDANSETRON HCL 4 MG PO TABS: 4.0000 mg | ORAL_TABLET | Freq: Four times a day (QID) | ORAL | Status: DC | PRN

## 2018-09-12 MED ORDER — NITROGLYCERIN 0.4 MG SL SUBL: 0.4000 mg | SUBLINGUAL_TABLET | SUBLINGUAL | Status: DC | PRN

## 2018-09-12 MED ORDER — VITAMIN B-12 1000 MCG PO TABS
500.0000 ug | ORAL_TABLET | Freq: Every day | ORAL | Status: DC
  Administered 2018-09-12 – 2018-09-13 (×2): 500 ug via ORAL
  Filled 2018-09-12 (×2): qty 1
  Filled 2018-09-12 (×2): qty 0.5

## 2018-09-12 MED ORDER — ATORVASTATIN CALCIUM 40 MG PO TABS
40.0000 mg | ORAL_TABLET | Freq: Every evening | ORAL | Status: DC
  Administered 2018-09-12: 40 mg via ORAL
  Filled 2018-09-12 (×2): qty 1

## 2018-09-12 MED ORDER — FLEET ENEMA 7-19 GM/118ML RE ENEM: 1.0000 | ENEMA | Freq: Every day | RECTAL | Status: DC | PRN

## 2018-09-12 NOTE — ED Provider Notes (Signed)
[facility] EMERGENCY DEPARTMENT Provider Note   CSN: [id number] Arrival date & time: 09/12/18  5462     History   Chief Complaint Chief Complaint  Patient presents with  . Chest Pain    HPI [name] [name] is a 77 y.o. male.  Patient complains of indigestion symptoms with hiccups.  Patient states he had the same symptoms before when he had a stent placed 10 years ago.  The history is provided by the patient. No language interpreter was used.  Chest Pain   This is a new problem. The current episode started 12 to 24 hours ago. The problem occurs constantly. The problem has not changed since onset.The pain is associated with exertion. The pain is present in the substernal region. The pain is at a severity of 4/10. The pain is moderate. The quality of the pain is described as dull. The pain does not radiate. Pertinent negatives include no abdominal pain, no back pain, no cough and no headaches.  Pertinent negatives for past medical history include no seizures.    Past Medical History:  Diagnosis Date  . Arthritis    shoulders and ribs   . Atrial fibrillation ([place])    atrial fib/ LOV [name] PA 04/11/12 EPIC, - CHEST X RAY, EKG 5/13 EPIC  . CAD (coronary artery disease)     s/p NSTEMI 04/08;  [place] 02/2007: Proximal LAD 75%, mid LAD 99%, proximal RCA 25%.  PCI:  Cypher DES to the proximal and mid LAD.  Last nuclear study 11/2011 (after a trip to the ED with CP): EF 58%, low risk study with small inferior wall infarct at the mid and basal level, no ischemia.  Last echo 01/2010: Mild LVH, EF 55-60%, mild AI, mild MR, severely dilated LA and RA  . Contrast dye induced nephropathy    Hx ARF secondary to contrast nephropathy  . Dyslipidemia   . Epididymitis   . History of blood transfusion   . Myocardial infarction ([place]) 2008  . Pleurisy    2012  . Pneumonia    hx of   . 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 Spinal stenosis   . Urothelial cancer ([place])    Dr. [name],  skin cancer non melanoma     Patient Active Problem List   Diagnosis Date Noted  . Chest pain 09/12/2018  . Nausea & vomiting 09/10/2018  . N&V (nausea and vomiting) 09/10/2018  . Constipation 09/10/2018  . Overweight (BMI 25.0-29.9) 09/06/2018  . S/P left TKA 09/05/2018  . 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 Elevated PSA 09/14/2017  . Primary osteoarthritis of left knee 01/27/2017  . OA (osteoarthritis) of knee 01/26/2017  . Spinal stenosis of lumbar region 12/08/2016  . GERD (gastroesophageal reflux disease) 11/26/2011  . Mixed hyperlipidemia 10/05/2008  . CAD (coronary artery disease) 10/05/2008  . Atrial fibrillation ([place]) 10/05/2008  . CKD (chronic kidney disease), stage III ([place]) 10/05/2008    Past Surgical History:  Procedure Laterality Date  . arm surgery     orif right elbow  . CARDIAC CATHETERIZATION  2008  . coronary stents     . CYSTECTOMY  07/15/08  . CYSTOSCOPY WITH BIOPSY  03/23/2012   Procedure: CYSTOSCOPY WITH BIOPSY;  Surgeon: [name], MD;  Location: [facility] ORS;  Service: Urology;  Laterality: Right;   BRUSH BIOPSY RIGHT URETERAL STENT  . CYSTOSCOPY WITH URETEROSCOPY  03/23/2012   Procedure: CYSTOSCOPY WITH URETEROSCOPY;  Surgeon: [name], MD;  Location: [facility];  Service: Urology;  Laterality: Right;    **OR Room #8 requested**  C-ARM   . CYSTOSCOPY/RETROGRADE/URETEROSCOPY  02/15/2012   Procedure: CYSTOSCOPY/RETROGRADE/URETEROSCOPY;  Surgeon: [name], MD;  Location: [facility];  Service: Urology;  Laterality: Right;  C-ARM  . JOINT REPLACEMENT     Dr. [name] 09-05-18   . left pinky finger      tip removed from accident  . right kidney removed      2013  . TOTAL KNEE ARTHROPLASTY Left 09/05/2018   Procedure: LEFT TOTAL KNEE ARTHROPLASTY;  Surgeon: [name], MD;  Location: [facility];  Service: Orthopedics;  Laterality: Left;  70 mins with abductor block  . URETER SURGERY          Home Medications    Prior to Admission medications   Medication Sig Start Date End Date  Taking? Authorizing Provider  aspirin (ASPIRIN CHILDRENS) 81 MG chewable tablet Chew 1 tablet (81 mg total) by mouth 2 (two) times daily. Take for 4 weeks, then resume regular dose. 09/06/18 10/06/18 Yes Babish, Rodman Key, PA-C  atorvastatin (LIPITOR) 40 MG tablet Take 1 tablet (40 mg total) by mouth every evening. 12/01/17  Yes [place], [name], MD  calcitRIOL (ROCALTROL) 0.5 MCG capsule Take 0.5 mcg by mouth daily.   Yes [provider]  furosemide (LASIX) 40 MG tablet Take 1 tablet (40 mg total) by mouth daily. 12/01/17  Yes [place], [name], MD  Inositol Niacinate (NIACIN FLUSH FREE) 500 MG CAPS Take 500 mg by mouth daily.    Yes [provider]  methocarbamol (ROBAXIN) 500 MG tablet Take 1 tablet (500 mg total) by mouth every 6 (six) hours as needed for muscle spasms. 09/05/18  Yes Babish, Matthew, PA-C  NITROSTAT 0.4 MG SL tablet Place 1 tablet (0.4 mg total) under the tongue every 5 (five) minutes as needed for chest pain. 09/18/15  Yes Isaiah Serge, NP  Omega-3 Fatty Acids (FISH OIL) 1200 MG CAPS Take 1,200 mg by mouth daily.    Yes [provider]  polyethylene glycol (MIRALAX / GLYCOLAX) packet Take 17 g by mouth 2 (two) times daily. 09/11/18  Yes Kathie Dike, MD  vitamin B-12 (CYANOCOBALAMIN) 500 MCG tablet Take 500 mcg by mouth daily.   Yes [provider]  docusate sodium (COLACE) 100 MG capsule Take 1 capsule (100 mg total) by mouth 2 (two) times daily. Patient not taking: Reported on 09/12/2018 09/05/18   Danae Orleans, PA-C  ferrous sulfate (FERROUSUL) 325 (65 FE) MG tablet Take 1 tablet (325 mg total) by mouth daily with breakfast. Patient not taking: Reported on 09/12/2018 09/11/18   Kathie Dike, MD  HYDROcodone-acetaminophen (NORCO) 7.5-325 MG tablet Take 1-2 tablets by mouth every 4 (four) hours as needed for moderate pain. Patient not taking: Reported on 09/12/2018 09/05/18   Danae Orleans, PA-C  sodium phosphate (FLEET) 7-19  GM/118ML ENEM Place 133 mLs (1 enema total) rectally daily as needed for severe constipation. Patient not taking: Reported on 09/12/2018 09/11/18   Kathie Dike, MD    Family History Family History  Problem Relation Age of Onset  . Cancer Mother   . Diabetes type II Mother   . Hyperlipidemia Mother   . Coronary artery disease Father   . Heart attack Father     Social History Social History   Tobacco Use  . Smoking status: Former Smoker    Packs/day: 2.00    Years: 10.00    Pack years: 20.00    Types: Cigarettes, Cigars  Last attempt to quit: 11/15/1981    Years since quitting: 36.8  . Smokeless tobacco: Never Used  Substance Use Topics  . Alcohol use: No  . Drug use: No     Allergies   Contrast media [iodinated diagnostic agents]; Iodine-131; Other; and Promethazine   Review of Systems Review of Systems  Constitutional: Negative for appetite change and fatigue.  HENT: Negative for congestion, ear discharge and sinus pressure.   Eyes: Negative for discharge.  Respiratory: Negative for cough.   Cardiovascular: Positive for chest pain.  Gastrointestinal: Negative for abdominal pain and diarrhea.  Genitourinary: Negative for frequency and hematuria.  Musculoskeletal: Negative for back pain.  Skin: Negative for rash.  Neurological: Negative for seizures and headaches.  Psychiatric/Behavioral: Negative for hallucinations.     Physical Exam Updated Vital Signs BP (!) 151/67   Pulse (!) 41   Temp 98.5 F (36.9 C) (Oral)   Resp 15   Ht 6' (1.829 m)   Wt 91 kg   SpO2 100%   BMI 27.21 kg/m   Physical Exam  Constitutional: He is oriented to person, place, and time. He appears well-developed.  HENT:  Head: Normocephalic.  Eyes: Conjunctivae and EOM are normal. No scleral icterus.  Neck: Neck supple. No thyromegaly present.  Cardiovascular: Normal rate and regular rhythm. Exam reveals no gallop and no friction rub.  No murmur heard. Pulmonary/Chest: No  stridor. He has no wheezes. He has no rales. He exhibits no tenderness.  Abdominal: He exhibits no distension. There is no tenderness. There is no rebound.  Musculoskeletal: Normal range of motion. He exhibits no edema.  Lymphadenopathy:    He has no cervical adenopathy.  Neurological: He is oriented to person, place, and time. He exhibits normal muscle tone. Coordination normal.  Skin: No rash noted. No erythema.  Psychiatric: He has a normal mood and affect. His behavior is normal.     ED Treatments / Results  Labs (all labs ordered are listed, but only abnormal results are displayed) Labs Reviewed  TROPONIN I - Abnormal; Notable for the following components:      Result Value   Troponin I 0.04 (*)    All other components within normal limits  CBC WITH DIFFERENTIAL/PLATELET - Abnormal; Notable for the following components:   RBC 3.53 (*)    Hemoglobin 10.7 (*)    HCT 33.3 (*)    Monocytes Absolute 1.2 (*)    Abs Immature Granulocytes 0.08 (*)    All other components within normal limits  COMPREHENSIVE METABOLIC PANEL - Abnormal; Notable for the following components:   Glucose, Bld 118 (*)    Calcium 8.4 (*)    Total Protein 6.2 (*)    Albumin 3.4 (*)    Total Bilirubin 1.4 (*)    All other components within normal limits    EKG None  Radiology Dg Chest 2 View  Result Date: 09/12/2018 CLINICAL DATA:  Chest pressure EXAM: CHEST - 2 VIEW COMPARISON:  04/05/2012 FINDINGS: There is hyperinflation of the lungs compatible with COPD. Mild cardiomegaly. No confluent opacities or effusions. No acute bony abnormality. Advanced degenerative changes in the shoulders. IMPRESSION: COPD.  Borderline cardiomegaly.  No active disease. Electronically Signed   By: Rolm Baptise M.D.   On: 09/12/2018 09:33    Procedures Procedures (including critical care time)  Medications Ordered in ED Medications - No data to display   Initial Impression / Assessment and Plan / ED Course  I have  reviewed the triage vital  signs and the nursing notes.  Pertinent labs & imaging results that were available during my care of the patient were reviewed by me and considered in my medical decision making (see chart for details).  Clinical Course as of Sep 12 1114  Tue Sep 12, 2018  1101 Troponin I(!!): 0.04 [CH]    Clinical Course User Index [CH] Mahalia Longest, [place]    Patient with chest pain and elevated troponin.  Patient's symptoms have improved.  Patient will be by medicine with cardiology consult  Final Clinical Impressions(s) / ED Diagnoses   Final diagnoses:  None    ED Discharge Orders    None       Milton Ferguson, MD 09/12/18 1118

## 2018-09-12 NOTE — Progress Notes (Signed)
Patient discussed with ER staff,  presents with very atypical chest pain, no objective evidence of ischemia by early evaluation. Further recommendations pending overnight workup, will see full consult in AM.   Zandra Abts MD

## 2018-09-12 NOTE — Progress Notes (Signed)
Has denied chest pain since admission.  Able to ambulate with walker.  Surgical dressing to left knee in place and clean and dry with ace wrap over that.  Stated had two large BMs this morning and refused miralax and colace.

## 2018-09-12 NOTE — H&P (Signed)
History and Physical    [name] [name] ZOX:[id number] DOB: Apr 03, 1941 DOA: 09/12/2018  PCP: Alycia Rossetti, MD   Patient coming from: Home  Chief Complaint: Indigestion and intractable hiccups  HPI: [name] [name] is a 77 y.o. male with medical history significant for CAD, paroxysmal atrial fibrillation, bladder cancer and recent total knee replacement on 10/22 who was recently admitted on 10/27 with complaints of nausea and vomiting.  He was at that time noted to have significant constipation and some dehydration and after receiving IV fluids as well as multiple enemas with bowel movements he was discharged home yesterday with improvement in symptoms.  He began to have some worsening indigestion and hiccups which is reminiscent of when he previously had an MI in 2008.  He does state that at that time he had some chest pain to, but does not currently have any chest pain.  He denies any diaphoresis, shortness of breath, or significant lower extremity edema.  He did have a meal in the ED which appears to have helped some of his symptoms.   ED Course: Laboratory data unremarkable and troponin is 0.04.  Two-view chest x-ray with findings of COPD and mild cardiomegaly but no other acute findings.  Two-view abdominal film with some cholelithiasis and constipation, but no other findings.  EKG with some atrial fibrillation at 54 bpm and age-indeterminate infarcts noted.  EDP has spoken with cardiology Dr. Harl Bowie who agrees that patient requires observation and further work-up.  He will see the patient during the inpatient stay.  Review of Systems: All others reviewed and otherwise negative.  Past Medical History:  Diagnosis Date  . Arthritis    shoulders and ribs   . Atrial fibrillation ([place])    atrial fib/ LOV [name] PA 04/11/12 EPIC, - CHEST X RAY, EKG 5/13 EPIC  . CAD (coronary artery disease)     s/p NSTEMI 04/08;  [place] 02/2007: Proximal LAD 75%, mid LAD 99%, proximal RCA 25%.  PCI:  Cypher DES to the  proximal and mid LAD.  Last nuclear study 11/2011 (after a trip to the ED with CP): EF 58%, low risk study with small inferior wall infarct at the mid and basal level, no ischemia.  Last echo 01/2010: Mild LVH, EF 55-60%, mild AI, mild MR, severely dilated LA and RA  . Contrast dye induced nephropathy    Hx ARF secondary to contrast nephropathy  . Dyslipidemia   . Epididymitis   . History of blood transfusion   . Myocardial infarction ([place]) 2008  . Pleurisy    2012  . Pneumonia    hx of   . 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 Spinal stenosis   . 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 [name],  skin cancer non melanoma    Past Surgical History:  Procedure Laterality Date  . arm surgery     orif right elbow  . CARDIAC CATHETERIZATION  2008  . coronary stents     . CYSTECTOMY  07/15/08  . CYSTOSCOPY WITH BIOPSY  03/23/2012   Procedure: CYSTOSCOPY WITH BIOPSY;  Surgeon: [name], MD;  Location: [facility];  Service: Urology;  Laterality: Right;   BRUSH BIOPSY RIGHT URETERAL STENT  . CYSTOSCOPY WITH URETEROSCOPY  03/23/2012   Procedure: CYSTOSCOPY WITH URETEROSCOPY;  Surgeon: [name], MD;  Location: [facility];  Service: Urology;  Laterality: Right;    **OR Room #8 requested**  C-ARM   . CYSTOSCOPY/RETROGRADE/URETEROSCOPY  02/15/2012   Procedure: CYSTOSCOPY/RETROGRADE/URETEROSCOPY;  Surgeon: [name], MD;  Location: [facility];  Service: Urology;  Laterality: Right;  C-ARM  . JOINT REPLACEMENT     Dr. [name] 09-05-18   . left pinky finger      tip removed from accident  . right kidney removed      2013  . TOTAL KNEE ARTHROPLASTY Left 09/05/2018   Procedure: LEFT TOTAL KNEE ARTHROPLASTY;  Surgeon: [name], MD;  Location: [facility];  Service: Orthopedics;  Laterality: Left;  70 mins with abductor block  . URETER SURGERY       reports that he quit smoking about 36 years ago. His smoking use included cigarettes and cigars. He has a 20.00 pack-year smoking history. He has never used smokeless  tobacco. He reports that he does not drink alcohol or use drugs.  Allergies  Allergen Reactions  . Contrast Media [Iodinated Diagnostic Agents]     Contrast-induced nephropathy requiring dialysis in 02/2007.  . Iodine-131 Other (See Comments)    Kidneys shut down  . Other Other (See Comments)    Beta blockers cause bradycardia  . Promethazine Other (See Comments)    Pt became drowsy and mild altered mental status    Family History  Problem Relation Age of Onset  . Cancer Mother   . Diabetes type II Mother   . Hyperlipidemia Mother   . Coronary artery disease Father   . Heart attack Father     Prior to Admission medications   Medication Sig Start Date End Date Taking? Authorizing Provider  aspirin (ASPIRIN CHILDRENS) 81 MG chewable tablet Chew 1 tablet (81 mg total) by mouth 2 (two) times daily. Take for 4 weeks, then resume regular dose. 09/06/18 10/06/18 Yes Babish, Rodman Key, PA-C  atorvastatin (LIPITOR) 40 MG tablet Take 1 tablet (40 mg total) by mouth every evening. 12/01/17  Yes [place], [name], MD  calcitRIOL (ROCALTROL) 0.5 MCG capsule Take 0.5 mcg by mouth daily.   Yes [provider]  furosemide (LASIX) 40 MG tablet Take 1 tablet (40 mg total) by mouth daily. 12/01/17  Yes [place], [name], MD  Inositol Niacinate (NIACIN FLUSH FREE) 500 MG CAPS Take 500 mg by mouth daily.    Yes [provider]  methocarbamol (ROBAXIN) 500 MG tablet Take 1 tablet (500 mg total) by mouth every 6 (six) hours as needed for muscle spasms. 09/05/18  Yes Babish, Matthew, PA-C  NITROSTAT 0.4 MG SL tablet Place 1 tablet (0.4 mg total) under the tongue every 5 (five) minutes as needed for chest pain. 09/18/15  Yes Isaiah Serge, NP  Omega-3 Fatty Acids (FISH OIL) 1200 MG CAPS Take 1,200 mg by mouth daily.    Yes [provider]  polyethylene glycol (MIRALAX / GLYCOLAX) packet Take 17 g by mouth 2 (two) times daily. 09/11/18  Yes Kathie Dike, MD  vitamin B-12  (CYANOCOBALAMIN) 500 MCG tablet Take 500 mcg by mouth daily.   Yes [provider]  docusate sodium (COLACE) 100 MG capsule Take 1 capsule (100 mg total) by mouth 2 (two) times daily. Patient not taking: Reported on 09/12/2018 09/05/18   Danae Orleans, PA-C  ferrous sulfate (FERROUSUL) 325 (65 FE) MG tablet Take 1 tablet (325 mg total) by mouth daily with breakfast. Patient not taking: Reported on 09/12/2018 09/11/18   Kathie Dike, MD  HYDROcodone-acetaminophen (NORCO) 7.5-325 MG tablet Take 1-2 tablets by mouth every 4 (four) hours as needed for moderate pain. Patient not taking: Reported on 09/12/2018 09/05/18   Danae Orleans, PA-C  sodium phosphate (FLEET) 7-19 GM/118ML ENEM Place 133 mLs (1 enema total) rectally daily as needed for severe constipation. Patient not taking: Reported on 09/12/2018 09/11/18   Kathie Dike, MD    Physical Exam: Vitals:   09/12/18 1330 09/12/18 1400 09/12/18 1430 09/12/18 1500  BP: 138/67 (!) 135/56 140/68 (!) 149/75  Pulse:  (!) 50    Resp: (!) 31 14    Temp:      TempSrc:      SpO2:  98% 99% 96%  Weight:      Height:        Constitutional: NAD, calm, comfortable Vitals:   09/12/18 1330 09/12/18 1400 09/12/18 1430 09/12/18 1500  BP: 138/67 (!) 135/56 140/68 (!) 149/75  Pulse:  (!) 50    Resp: (!) 31 14    Temp:      TempSrc:      SpO2:  98% 99% 96%  Weight:      Height:       Eyes: lids and conjunctivae normal ENMT: Mucous membranes are moist.  Neck: normal, supple Respiratory: clear to auscultation bilaterally. Normal respiratory effort. No accessory muscle use.  Currently on room air. Cardiovascular: Regular rate and rhythm, no murmurs. No extremity edema. Abdomen: no tenderness, no distention. Bowel sounds positive.  Musculoskeletal:  No joint deformity upper and lower extremities.   Skin: no rashes, lesions, ulcers.  Psychiatric: Normal judgment and insight. Alert and oriented x 3. Normal mood.   Labs on Admission:  I have personally reviewed following labs and imaging studies  CBC: Recent Labs  Lab 09/06/18 0506 09/10/18 0144 09/11/18 0613 09/12/18 0833  WBC 12.0* 8.9 9.4 9.6  NEUTROABS  --   --   --  7.5  HGB 12.1* 11.9* 10.3* 10.7*  HCT 37.4* 37.8* 32.6* 33.3*  MCV 95.7 94.0 95.9 94.3  PLT 139* 165 154 841   Basic Metabolic Panel: Recent Labs  Lab 09/06/18 0506 09/10/18 0144 09/11/18 0613 09/12/18 0833  NA 137 139 138 138  K 4.0 3.8 4.0 4.0  CL 102 103 104 104  CO2 24 26 25 26   GLUCOSE 140* 131* 118* 118*  BUN 20 22 22 23   CREATININE 1.16 1.17 1.12 1.06  CALCIUM 8.0* 8.9 8.2* 8.4*  MG  --   --  2.1  --    GFR: Estimated Creatinine Clearance: 62 mL/min (by C-G formula based on SCr of 1.06 mg/dL). Liver Function Tests: Recent Labs  Lab 09/10/18 0144 09/11/18 0613 09/12/18 0833  AST 34 22 25  ALT 16 13 14   ALKPHOS 45 35* 44  BILITOT 1.2 1.0 1.4*  PROT 6.9 5.8* 6.2*  ALBUMIN 3.7 3.1* 3.4*   Recent Labs  Lab 09/10/18 0144  LIPASE 43   No results for input(s): AMMONIA in the last 168 hours. Coagulation Profile: No results for input(s): INR, PROTIME in the last 168 hours. Cardiac Enzymes: Recent Labs  Lab 09/10/18 0324 09/10/18 0538 09/10/18 1148 09/12/18 0833  TROPONINI <0.03 0.03* 0.04* 0.04*   BNP (last 3 results) No results for input(s): PROBNP in the last 8760 hours. HbA1C: No results for input(s): HGBA1C in the last 72 hours. CBG: No results for input(s): GLUCAP in the last 168 hours. Lipid Profile: No results for input(s): CHOL, HDL, LDLCALC, TRIG, CHOLHDL, LDLDIRECT in the last 72 hours. Thyroid Function Tests: Recent Labs    09/10/18 0538  TSH 3.185   Anemia Panel: No results for input(s): VITAMINB12, FOLATE, FERRITIN, TIBC, IRON, RETICCTPCT in the last 72  hours. Urine analysis:    Component Value Date/Time   COLORURINE YELLOW 08/17/2018 1402   APPEARANCEUR CLEAR 08/17/2018 1402   LABSPEC 1.021 08/17/2018 1402   PHURINE 6.0 08/17/2018  1402   GLUCOSEU NEGATIVE 08/17/2018 1402   HGBUR NEGATIVE 08/17/2018 1402   KETONESUR NEGATIVE 08/17/2018 1402   PROTEINUR NEGATIVE 08/17/2018 1402   NITRITE NEGATIVE 08/17/2018 1402   LEUKOCYTESUR NEGATIVE 08/17/2018 1402    Radiological Exams on Admission: Dg Chest 2 View  Result Date: 09/12/2018 CLINICAL DATA:  Chest pressure EXAM: CHEST - 2 VIEW COMPARISON:  04/05/2012 FINDINGS: There is hyperinflation of the lungs compatible with COPD. Mild cardiomegaly. No confluent opacities or effusions. No acute bony abnormality. Advanced degenerative changes in the shoulders. IMPRESSION: COPD.  Borderline cardiomegaly.  No active disease. Electronically Signed   By: Rolm Baptise M.D.   On: 09/12/2018 09:33    EKG: Independently reviewed.  Atrial fibrillation at 54 bpm with age-indeterminate infarct noted.  Assessment/Plan Principal Problem:   Chest pain Active Problems:   CAD (coronary artery disease)   Atrial fibrillation ([facility])   CKD (chronic kidney disease), stage III ([facility]CC)   GERD (gastroesophageal reflux disease)   S/P left TKA   Constipation    1. Atypical chest pain in the setting of prior CAD.  Uncertain whether pain exactly mimics prior symptoms of MI, but will continue to monitor with repeat troponins and telemetry.  Appreciate cardiology recommendations.  Maintain on aspirin as well as statin.  No current chest pain noted.  Continue on Protonix on account of GI symptoms as well as Zofran as needed.  Maintain on cardiac prudent diet as this appears to be helping. 2. CAD.  Maintain on aspirin and statin.  Continue Lasix 40 mg p.o. daily. 3. Atrial fibrillation.  Continue on aspirin, patient was formally on Xarelto but did not want to take it. 4. Anemia-stable.  Continue B12.  Hold iron, as this appears to be aggravating symptoms.  Recheck CBC in a.m. 5. Constipation.  Noted on prior admission with need for enema.  Maintain on laxation with MiraLAX twice daily as well as enemas as  needed.   DVT prophylaxis: Lovenox Code Status: Full code Family Communication: None at bedside Disposition Plan: ACS rule out Consults called: Cardiology called by EDP Admission status: Telemetry, observation   Brexley Cutshaw Darleen Crocker DO [facility] Pager [phone]  If 7PM-7AM, please contact night-coverage [email or web address] Password [email or web address]  09/12/2018, 3:03 PM

## 2018-09-12 NOTE — ED Notes (Signed)
Pt states he has two good bowel movements this morning,

## 2018-09-12 NOTE — ED Triage Notes (Signed)
PT was release from the hospital yesterday and states when he got home he started having pressure across his chest with increased upper abdominal pressure and burping. PT states he vomited small amounts throughout the night.

## 2018-09-12 NOTE — ED Notes (Signed)
Pt updated at this time. Pt states he doesn't need anything at this time.

## 2018-09-12 NOTE — ED Notes (Signed)
Ordered [facility] meal tray for patient per EDP

## 2018-09-12 NOTE — ED Notes (Signed)
Hosptialist at the bedside 

## 2018-09-13 DIAGNOSIS — R079 Chest pain, unspecified: Secondary | ICD-10-CM | POA: Diagnosis not present

## 2018-09-13 LAB — COMPREHENSIVE METABOLIC PANEL
ALBUMIN: 2.8 g/dL — AB (ref 3.5–5.0)
ALT: 14 U/L (ref 0–44)
ANION GAP: 7 (ref 5–15)
AST: 23 U/L (ref 15–41)
Alkaline Phosphatase: 45 U/L (ref 38–126)
BILIRUBIN TOTAL: 0.9 mg/dL (ref 0.3–1.2)
BUN: 20 mg/dL (ref 8–23)
CHLORIDE: 103 mmol/L (ref 98–111)
CO2: 25 mmol/L (ref 22–32)
Calcium: 7.7 mg/dL — ABNORMAL LOW (ref 8.9–10.3)
Creatinine, Ser: 1.01 mg/dL (ref 0.61–1.24)
GFR calc Af Amer: >60 mL/min (ref >60)
GFR calc non Af Amer: >60 mL/min (ref >60)
GLUCOSE: 111 mg/dL — AB (ref 70–99)
POTASSIUM: 3.6 mmol/L (ref 3.5–5.1)
SODIUM: 135 mmol/L (ref 135–145)
TOTAL PROTEIN: 5.4 g/dL — AB (ref 6.5–8.1)

## 2018-09-13 LAB — CBC
HEMATOCRIT: 31 % — AB (ref 39.0–52.0)
HEMOGLOBIN: 10.2 g/dL — AB (ref 13.0–17.0)
MCH: 31.1 pg (ref 26.0–34.0)
MCHC: 32.9 g/dL (ref 30.0–36.0)
MCV: 94.5 fL (ref 80.0–100.0)
Platelets: 160 10*3/uL (ref 150–400)
RBC: 3.28 MIL/uL — ABNORMAL LOW (ref 4.22–5.81)
RDW: 13.2 % (ref 11.5–15.5)
WBC: 8.6 10*3/uL (ref 4.0–10.5)
nRBC: 0 % (ref 0.0–0.2)

## 2018-09-13 LAB — TROPONIN I: TROPONIN I: 0.03 ng/mL — AB (ref <0.03)

## 2018-09-13 MED ORDER — ONDANSETRON HCL 4 MG PO TABS: 4.0000 mg | ORAL_TABLET | Freq: Four times a day (QID) | ORAL | 0 refills | Status: DC | PRN

## 2018-09-13 MED ORDER — PANTOPRAZOLE SODIUM 40 MG PO TBEC: 40.0000 mg | DELAYED_RELEASE_TABLET | Freq: Every day | ORAL | 0 refills | Status: DC

## 2018-09-13 NOTE — Consult Note (Addendum)
Cardiology Consult    Patient ID: [name] [name]; [id number]; 09/29/1941   Admit date: 09/12/2018 Date of Consult: 09/13/2018  Primary Care Provider: Alycia Rossetti, MD Primary Cardiologist: Sherren Mocha, MD   Patient Profile    [name] [name] is a 77 y.o. male with past medical history of CAD (s/p DES to proximal and mid-LAD in 02/2007), permanent atrial fibrillation (has declined anticoagulation), HLD, and Stage 3 CKD who is being seen today for the evaluation of chest pain at the request of Dr. Manuella Ghazi.   History of Present Illness    [name] [name] was last examined by Robbie Lis, PA-C on 08/18/2018 for cardiac clearance in regards to upcoming knee replacement. He reported exercising multiple times per week without any recurrent anginal symptoms. Had stopped Xarelto in 06/2018 due to frequent bruising and cost. Was not interested in resuming Xarelto at that time. Given his activity level, he was cleared to proceed without the indication for repeat ischemic evaluation.   He underwent left total knee replacement on 09/05/2018 by Dr. [name] with no immediate complications noted and was discharged home the following day. He did present to [facility] ED on 09/10/2018 for evaluation of nausea and vomiting since hospital discharge. Troponin values were obtained that admission and remained flat at 0.03 and 0.04. Abdominal film showed constipation with no evidence of bowel obstruction. He was started on a bowel regimen and IVF with resolution of his symptoms following multiple bowel movements.  Was discharged on 09/11/2018 but presented back to the ED the following day for chest and abdominal pain.   He reports that he has been having frequent episodes of constipation since being started on pain medications and iron supplementation following recent knee surgery. When he experiences constipation, he also reports belching and develops discomfort along his epigastric region. He feels like his symptoms are mostly  GI related as they improve when he belches or when he has a bowel movement. He has had several bowel movement since admission and says his pain has since resolved. He denies any exertional chest pain or symptoms that resemble his prior angina. Denies any recent dyspnea on exertion, orthopnea, PND, or palpitations. Has experienced some lower extremity edema since surgery has continued on PO Lasix 40 mg daily.  Repeat labs show WBC 9.6, Hgb 10.7, platelets 170, K+ 4.0, Na+ 138, and creatinine 1.06. Initial troponin 0.04 with repeat values 0.03, 0.03, and 0.03. CXR showing COPD with borderline cardiomegaly and no active disease. EKG shows rate-controlled atrial fibrillation, HR 54, with slight TWI along V1-V2 which is similar to prior tracings.     Past Medical History:  Diagnosis Date  . Arthritis    shoulders and ribs   . Atrial fibrillation ([place])    atrial fib/ LOV [name] PA 04/11/12 EPIC, - CHEST X RAY, EKG 5/13 EPIC  . CAD (coronary artery disease)     s/p NSTEMI 04/08;  [facility] 02/2007: Proximal LAD 75%, mid LAD 99%, proximal RCA 25%.  PCI:  Cypher DES to the proximal and mid LAD.  Last nuclear study 11/2011 (after a trip to the ED with CP): EF 58%, low risk study with small inferior wall infarct at the mid and basal level, no ischemia.  Last echo 01/2010: Mild LVH, EF 55-60%, mild AI, mild MR, severely dilated LA and RA  . Contrast dye induced nephropathy    Hx ARF secondary to contrast nephropathy  . Dyslipidemia   . Epididymitis   . History of blood transfusion   .  Myocardial infarction ([place]) 2008  . Pleurisy    2012  . Pneumonia    hx of   . 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 Spinal stenosis   . 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 [name],  skin cancer non melanoma    Past Surgical History:  Procedure Laterality Date  . arm surgery     orif right elbow  . CARDIAC CATHETERIZATION  2008  . coronary stents     . CYSTECTOMY  07/15/08  . CYSTOSCOPY WITH BIOPSY  03/23/2012   Procedure: CYSTOSCOPY  WITH BIOPSY;  Surgeon: [name], MD;  Location: [facility];  Service: Urology;  Laterality: Right;   BRUSH BIOPSY RIGHT URETERAL STENT  . CYSTOSCOPY WITH URETEROSCOPY  03/23/2012   Procedure: CYSTOSCOPY WITH URETEROSCOPY;  Surgeon: [name], MD;  Location: [facility];  Service: Urology;  Laterality: Right;    **OR Room #8 requested**  C-ARM   . CYSTOSCOPY/RETROGRADE/URETEROSCOPY  02/15/2012   Procedure: CYSTOSCOPY/RETROGRADE/URETEROSCOPY;  Surgeon: [name], MD;  Location: [facility];  Service: Urology;  Laterality: Right;  C-ARM  . JOINT REPLACEMENT     Dr. [name] 09-05-18   . left pinky finger      tip removed from accident  . right kidney removed      2013  . TOTAL KNEE ARTHROPLASTY Left 09/05/2018   Procedure: LEFT TOTAL KNEE ARTHROPLASTY;  Surgeon: [name], MD;  Location: [facility];  Service: Orthopedics;  Laterality: Left;  70 mins with abductor block  . URETER SURGERY       Home Medications:  Prior to Admission medications   Medication Sig Start Date End Date Taking? Authorizing Provider  aspirin (ASPIRIN CHILDRENS) 81 MG chewable tablet Chew 1 tablet (81 mg total) by mouth 2 (two) times daily. Take for 4 weeks, then resume regular dose. 09/06/18 10/06/18 Yes Babish, Rodman Key, PA-C  atorvastatin (LIPITOR) 40 MG tablet Take 1 tablet (40 mg total) by mouth every evening. 12/01/17  Yes [place], [name], MD  calcitRIOL (ROCALTROL) 0.5 MCG capsule Take 0.5 mcg by mouth daily.   Yes [provider]  furosemide (LASIX) 40 MG tablet Take 1 tablet (40 mg total) by mouth daily. 12/01/17  Yes [place], [name], MD  Inositol Niacinate (NIACIN FLUSH FREE) 500 MG CAPS Take 500 mg by mouth daily.    Yes [provider]  methocarbamol (ROBAXIN) 500 MG tablet Take 1 tablet (500 mg total) by mouth every 6 (six) hours as needed for muscle spasms. 09/05/18  Yes Babish, Matthew, PA-C  NITROSTAT 0.4 MG SL tablet Place 1 tablet (0.4 mg total) under the tongue every 5 (five) minutes as  needed for chest pain. 09/18/15  Yes Isaiah Serge, NP  Omega-3 Fatty Acids (FISH OIL) 1200 MG CAPS Take 1,200 mg by mouth daily.    Yes [provider]  polyethylene glycol (MIRALAX / GLYCOLAX) packet Take 17 g by mouth 2 (two) times daily. 09/11/18  Yes Kathie Dike, MD  vitamin B-12 (CYANOCOBALAMIN) 500 MCG tablet Take 500 mcg by mouth daily.   Yes [provider]  docusate sodium (COLACE) 100 MG capsule Take 1 capsule (100 mg total) by mouth 2 (two) times daily. Patient not taking: Reported on 09/12/2018 09/05/18   Danae Orleans, PA-C  ferrous sulfate (FERROUSUL) 325 (65 FE) MG tablet Take 1 tablet (325 mg total) by mouth daily with breakfast. Patient not taking: Reported on 09/12/2018 09/11/18   Kathie Dike, MD  HYDROcodone-acetaminophen ([place]) 7.5-325 MG  tablet Take 1-2 tablets by mouth every 4 (four) hours as needed for moderate pain. Patient not taking: Reported on 09/12/2018 09/05/18   Danae Orleans, PA-C  sodium phosphate (FLEET) 7-19 GM/118ML ENEM Place 133 mLs (1 enema total) rectally daily as needed for severe constipation. Patient not taking: Reported on 09/12/2018 09/11/18   Kathie Dike, MD    Inpatient Medications: Scheduled Meds: . aspirin  81 mg Oral BID  . atorvastatin  40 mg Oral QPM  . calcitRIOL  0.5 mcg Oral Daily  . docusate sodium  100 mg Oral BID  . enoxaparin (LOVENOX) injection  40 mg Subcutaneous Q24H  . furosemide  40 mg Oral Daily  . pantoprazole  40 mg Oral Daily  . polyethylene glycol  17 g Oral BID  . sodium chloride flush  3 mL Intravenous Q12H  . vitamin B-12  500 mcg Oral Daily   Continuous Infusions: . sodium chloride     PRN Meds: sodium chloride, acetaminophen **OR** acetaminophen, nitroGLYCERIN, ondansetron **OR** ondansetron (ZOFRAN) IV, sodium chloride flush, sodium phosphate  Allergies:    Allergies  Allergen Reactions  . Contrast Media [Iodinated Diagnostic Agents]     Contrast-induced nephropathy  requiring dialysis in 02/2007.  . Iodine-131 Other (See Comments)    Kidneys shut down  . Other Other (See Comments)    Beta blockers cause bradycardia  . Promethazine Other (See Comments)    Pt became drowsy and mild altered mental status    Social History:   Social History   Socioeconomic History  . Marital status: Married    Spouse name: [name]  . Number of children: 3  . Years of education: 65  . Highest education level: Not on file  Occupational History  . Occupation: Retired  Scientific laboratory technician  . Financial resource strain: Not on file  . Food insecurity:    Worry: Not on file    Inability: Not on file  . Transportation needs:    Medical: Not on file    Non-medical: Not on file  Tobacco Use  . Smoking status: Former Smoker    Packs/day: 2.00    Years: 10.00    Pack years: 20.00    Types: Cigarettes, Cigars    Last attempt to quit: 11/15/1981    Years since quitting: 36.8  . Smokeless tobacco: Never Used  Substance and Sexual Activity  . Alcohol use: No  . Drug use: No  . Sexual activity: Not Currently  Lifestyle  . Physical activity:    Days per week: Not on file    Minutes per session: Not on file  . Stress: Not on file  Relationships  . Social connections:    Talks on phone: Not on file    Gets together: Not on file    Attends religious service: Not on file    Active member of club or organization: Not on file    Attends meetings of clubs or organizations: Not on file    Relationship status: Not on file  . Intimate partner violence:    Fear of current or ex partner: Not on file    Emotionally abused: Not on file    Physically abused: Not on file    Forced sexual activity: Not on file  Other Topics Concern  . Not on file  Social History Narrative   Lives in [place], [place] with wife. Exercising 3-4x/week.      Family History:    Family History  Problem Relation Age of Onset  .  Cancer Mother   . Diabetes type II Mother   . Hyperlipidemia Mother     . Coronary artery disease Father   . Heart attack Father       Review of Systems    General:  No chills, fever, night sweats or weight changes.  Cardiovascular:  No chest pain, dyspnea on exertion, edema, orthopnea, palpitations, paroxysmal nocturnal dyspnea. Dermatological: No rash, lesions/masses Respiratory: No cough, dyspnea Urologic: No hematuria, dysuria Abdominal:   No diarrhea, bright red blood per rectum, melena, or hematemesis. Positive for abdominal pain and constipation.  Neurologic:  No visual changes, wkns, changes in mental status.  All other systems reviewed and are otherwise negative except as noted above.  Physical Exam/Data    Vitals:   09/12/18 1530 09/12/18 1609 09/12/18 2128 09/13/18 0610  BP: 108/61 (!) 159/89 (!) 156/70 (!) 162/82  Pulse:  (!) 44 (!) 49 97  Resp:  20 18 18   Temp:  98 F (36.7 C) 99.1 F (37.3 C) 97.9 F (36.6 C)  TempSrc:  Oral Oral Oral  SpO2:  98% 99% 97%  Weight:  91.8 kg    Height:  6' (1.829 m)      Intake/Output Summary (Last 24 hours) at 09/13/2018 0802 Last data filed at 09/12/2018 2300 Gross per 24 hour  Intake 483 ml  Output 650 ml  Net -167 ml   Filed Weights   09/12/18 0758 09/12/18 1609  Weight: 91 kg 91.8 kg   Body mass index is 27.45 kg/m.   General: Pleasant, Caucasian male appearing in NAD Psych: Normal affect. Neuro: Alert and oriented X 3. Moves all extremities spontaneously. HEENT: Normal  Neck: Supple without bruits or JVD. Lungs:  Resp regular and unlabored, CTA without wheezing or rales. Heart: Irregularly irregular, no s3, s4, or murmurs. Abdomen: Soft, non-tender, non-distended, BS + x 4.  Extremities: No clubbing, cyanosis or edema. DP/PT/Radials 2+ and equal bilaterally. Left knee wrapped.    Labs/Studies     Relevant CV Studies:  NST: 2013 Quantitative Gated Spect Images QGS EDV:  118 ml QGS ESV:  50 ml QGS cine images:  NL LV Function; NL Wall Motion QGS EF:  58%  Impression Exercise Capacity:  Lexiscan with no exercise. BP Response:  Normal blood pressure response. Clinical Symptoms:  No chest pain. ECG Impression:  Baseline ECG is atrial fibrillation Comparison with Prior Nuclear Study: No images to compare  Overall Impression:  Low risk stress nuclear study.  Small inferior wall infarct at mid and basal level with no ischemia EF 58%    Laboratory Data:  Chemistry Recent Labs  Lab 09/11/18 0613 09/12/18 0833 09/13/18 0346  NA 138 138 135  K 4.0 4.0 3.6  CL 104 104 103  CO2 25 26 25   GLUCOSE 118* 118* 111*  BUN 22 23 20   CREATININE 1.12 1.06 1.01  CALCIUM 8.2* 8.4* 7.7*  GFRNONAA >60 >60 >60  GFRAA >60 >60 >60  ANIONGAP 9 8 7     Recent Labs  Lab 09/11/18 0613 09/12/18 0833 09/13/18 0346  PROT 5.8* 6.2* 5.4*  ALBUMIN 3.1* 3.4* 2.8*  AST 22 25 23   ALT 13 14 14   ALKPHOS 35* 44 45  BILITOT 1.0 1.4* 0.9   Hematology Recent Labs  Lab 09/11/18 0613 09/12/18 0833 09/13/18 0346  WBC 9.4 9.6 8.6  RBC 3.40* 3.53* 3.28*  HGB 10.3* 10.7* 10.2*  HCT 32.6* 33.3* 31.0*  MCV 95.9 94.3 94.5  MCH 30.3 30.3 31.1  MCHC 31.6 32.1  32.9  RDW 13.2 13.2 13.2  PLT 154 170 160   Cardiac Enzymes Recent Labs  Lab 09/10/18 0538 09/10/18 1148 09/12/18 0833 09/12/18 1522 09/12/18 2047 09/13/18 0346  TROPONINI 0.03* 0.04* 0.04* 0.03* 0.03* 0.03*   No results for input(s): TROPIPOC in the last 168 hours.  BNPNo results for input(s): BNP, PROBNP in the last 168 hours.  DDimer No results for input(s): DDIMER in the last 168 hours.  Radiology/Studies:  Dg Chest 2 View  Result Date: 09/12/2018 CLINICAL DATA:  Chest pressure EXAM: CHEST - 2 VIEW COMPARISON:  04/05/2012 FINDINGS: There is hyperinflation of the lungs compatible with COPD. Mild cardiomegaly. No confluent opacities or effusions. No acute bony abnormality. Advanced degenerative changes in the shoulders. IMPRESSION: COPD.  Borderline cardiomegaly.  No active disease.  Electronically Signed   By: Rolm Baptise M.D.   On: 09/12/2018 09:33   Dg Abd Portable 2v  Result Date: 09/10/2018 CLINICAL DATA:  77 year old male with nausea vomiting. EXAM: PORTABLE ABDOMEN - 2 VIEW COMPARISON:  CT of the abdomen pelvis dated 09/10/2016 FINDINGS: There is moderate stool throughout the colon. No bowel dilatation or evidence of obstruction. Multiple gallstones noted. No free air. Degenerative changes of the spine. IMPRESSION: 1. Constipation.  No evidence of bowel obstruction. 2. Cholelithiasis. Electronically Signed   By: Anner Crete M.D.   On: 09/10/2018 06:29     Assessment & Plan    1. Atypical Chest Pain - presented for evaluation of nausea, vomiting, and epigastric pain which started following his recent knee surgery. Symptoms improved following several bowel movements. He denies any pain at this time. Says his symptoms did not resemble his prior angina.  - Initial troponin 0.04 with repeat values 0.03, 0.03, and 0.03. CXR showing COPD with borderline cardiomegaly and no active disease. EKG shows rate-controlled atrial fibrillation, HR 54, with slight TWI along V1-V2 which is similar to prior tracings.  - given his overall atypical symptoms, would not anticipate further cardiac evaluation at this time. If he has recurrent pain despite improvement in his bowel regimen, could consider further outpatient ischemic evaluation as his last NST was in 2013 but he reports being very active prior to surgery and denies any anginal symptoms during that timeframe.   2. CAD - s/p DES to proximal and mid-LAD in 02/2007.  - continue ASA and statin therapy.  3. Permanent Atrial Fibrillation - rates have been well-controlled in the 50's to 60's by review of telemetry. Not on AV nodal blocking agents given baseline bradycardia.  - he was previously on Xarelto but discontinued this given frequent bruising and cost of NOAC. Not interested in resuming anticoagulation at this time. Aware  of increased thromboembolic risk. Remains on ASA 81mg  BID (per Ortho recommendations) with plans to reduce back to 81mg  daily once 4 weeks post-op.   4. HLD - followed by PCP. Goal LDL is < 70 with known CAD.  - remains on Atorvastatin 40mg  daily.   5. Elevated BP - no formal diagnosis of HTN prior to admission. BP elevated to 162/82 on most recent check.  Encouraged the patient to continue to follow BP in the ambulatory setting.    For questions or updates, please contact [place] Please consult [email or web address] for contact info under Cardiology/STEMI.  Signed, Erma Heritage, PA-C 09/13/2018, 8:02 AM Pager: [phone]  Patient seen and discussed with PA Ahmed Prima, I agree with her documentation above. 77 yo male history of CAD, permanent afib, CKD III, HL. From CAD standpoint history  of MI in 2008, received DES to LAD.     WBC 9.6 Hgb 10.7 Plt 170 K 4 Cr 1.06  Trop 0.04-->0.03-->0.03-->0.03 CXR no acute process EKG afib rate 54, chronic anterior ST/T changes   Flat very mild trop just above detection level not consistent with ACS, EKG without specific changes. Symptoms are very atypical and primarily GI in description. With very atypical symptoms and lack of objective evidence for ischemia, no plans for further cardiac testing at this time. We will signoff at this time   [place] will sign off.   Medication Recommendations:  No changes Other recommendations (labs, testing, etc):  none Follow up as an outpatient:  Maintain regular follow up   Zandra Abts MD

## 2018-09-13 NOTE — Progress Notes (Signed)
Removed IV-clean, dry, intact. Reviewed d/c paperwork with patient and wife. Reviewed medication changes and new meds and where to pick up. Wheeled stable patient and belongings to main entrance where he was picked up by his wife.

## 2018-09-13 NOTE — Discharge Summary (Signed)
Physician Discharge Summary  [name] [name] OIZ:[id number] DOB: May 29, 1941 DOA: 09/12/2018  PCP: Alycia Rossetti, MD  Admit date: 09/12/2018  Discharge date: 09/13/2018  Admitted From: Home  Disposition: Home  Recommendations for Outpatient Follow-up:  1. Follow up with PCP in 1-2 weeks 2. Continue medications as prescribed 3. Continue to take Protonix for the next 30 days and avoid any further iron supplementation  Home Health: None  Equipment/Devices: None  Discharge Condition: Stable  CODE STATUS: Full  Diet recommendation: Heart Healthy  Brief/Interim Summary: Per HPI: [name] [name] is a 77 y.o. male with medical history significant for CAD, paroxysmal atrial fibrillation, bladder cancer and recent total knee replacement on 10/22 who was recently admitted on 10/27 with complaints of nausea and vomiting.  He was at that time noted to have significant constipation and some dehydration and after receiving IV fluids as well as multiple enemas with bowel movements he was discharged home yesterday with improvement in symptoms.  He began to have some worsening indigestion and hiccups which is reminiscent of when he previously had an MI in 2008.  He does state that at that time he had some chest pain to, but does not currently have any chest pain.  He denies any diaphoresis, shortness of breath, or significant lower extremity edema.  He did have a meal in the ED which appears to have helped some of his symptoms.  His cardiac work-up has remained negative during this brief admission and cardiology has seen the patient with no further recommendations at this time.  His symptoms appear to be a GI etiology for which he may receive further outpatient work-up as warranted.  Currently he is fully asymptomatic and his hiccups had resolved within hours of admission.  He has been recommended to remain on Protonix daily and to avoid any further iron supplementation as he feels that this may be  contributing to his symptoms.  He is encouraged to increase iron intake through his diet and follow-up with his primary care provider.  No acute events during the course of this brief admission.  Discharge Diagnoses:  Principal Problem:   Chest pain Active Problems:   CAD (coronary artery disease)   Atrial fibrillation (HCC)   CKD (chronic kidney disease), stage III (HCC)   GERD (gastroesophageal reflux disease)   S/P left TKA   Constipation    Discharge Instructions  Discharge Instructions    Diet - low sodium heart healthy   Complete by:  As directed    Increase activity slowly   Complete by:  As directed      Allergies as of 09/13/2018      Reactions   Contrast Media [iodinated Diagnostic Agents]    Contrast-induced nephropathy requiring dialysis in 02/2007.   Iodine-131 Other (See Comments)   Kidneys shut down   Other Other (See Comments)   Beta blockers cause bradycardia   Promethazine Other (See Comments)   Pt became drowsy and mild altered mental status      Medication List    STOP taking these medications   ferrous sulfate 325 (65 FE) MG tablet     TAKE these medications   aspirin 81 MG chewable tablet Chew 1 tablet (81 mg total) by mouth 2 (two) times daily. Take for 4 weeks, then resume regular dose.   atorvastatin 40 MG tablet Commonly known as:  LIPITOR Take 1 tablet (40 mg total) by mouth every evening.   calcitRIOL 0.5 MCG capsule Commonly known as:  ROCALTROL Take 0.5  mcg by mouth daily.   docusate sodium 100 MG capsule Commonly known as:  COLACE Take 1 capsule (100 mg total) by mouth 2 (two) times daily.   Fish Oil 1200 MG Caps Take 1,200 mg by mouth daily.   furosemide 40 MG tablet Commonly known as:  LASIX Take 1 tablet (40 mg total) by mouth daily.   HYDROcodone-acetaminophen 7.5-325 MG tablet Commonly known as:  NORCO Take 1-2 tablets by mouth every 4 (four) hours as needed for moderate pain.   methocarbamol 500 MG  tablet Commonly known as:  ROBAXIN Take 1 tablet (500 mg total) by mouth every 6 (six) hours as needed for muscle spasms.   NIACIN FLUSH FREE 500 MG Caps Generic drug:  Inositol Niacinate Take 500 mg by mouth daily.   NITROSTAT 0.4 MG SL tablet Generic drug:  nitroGLYCERIN Place 1 tablet (0.4 mg total) under the tongue every 5 (five) minutes as needed for chest pain.   ondansetron 4 MG tablet Commonly known as:  ZOFRAN Take 1 tablet (4 mg total) by mouth every 6 (six) hours as needed for nausea.   pantoprazole 40 MG tablet Commonly known as:  PROTONIX Take 1 tablet (40 mg total) by mouth daily. Start taking on:  09/14/2018   polyethylene glycol packet Commonly known as:  MIRALAX / GLYCOLAX Take 17 g by mouth 2 (two) times daily.   sodium phosphate 7-19 GM/118ML Enem Place 133 mLs (1 enema total) rectally daily as needed for severe constipation.   vitamin B-12 500 MCG tablet Commonly known as:  CYANOCOBALAMIN Take 500 mcg by mouth daily.      Follow-up Information    [place], [name], MD Follow up in 1 week(s).   Specialty:  Family Medicine Contact information: [address] [place] [address] [phone]        Sherren Mocha, MD .   Specialty:  Cardiology Contact information: [address] [place] [address] [phone]          Allergies  Allergen Reactions  . Contrast Media [Iodinated Diagnostic Agents]     Contrast-induced nephropathy requiring dialysis in 02/2007.  . Iodine-131 Other (See Comments)    Kidneys shut down  . Other Other (See Comments)    Beta blockers cause bradycardia  . Promethazine Other (See Comments)    Pt became drowsy and mild altered mental status    Consultations:  Cardiology-Dr. Harl Bowie   Procedures/Studies: Dg Chest 2 View  Result Date: 09/12/2018 CLINICAL DATA:  Chest pressure EXAM: CHEST - 2 VIEW COMPARISON:  04/05/2012 FINDINGS: There is hyperinflation of the lungs compatible  with COPD. Mild cardiomegaly. No confluent opacities or effusions. No acute bony abnormality. Advanced degenerative changes in the shoulders. IMPRESSION: COPD.  Borderline cardiomegaly.  No active disease. Electronically Signed   By: Rolm Baptise M.D.   On: 09/12/2018 09:33   Dg Abd Portable 2v  Result Date: 09/10/2018 CLINICAL DATA:  77 year old male with nausea vomiting. EXAM: PORTABLE ABDOMEN - 2 VIEW COMPARISON:  CT of the abdomen pelvis dated 09/10/2016 FINDINGS: There is moderate stool throughout the colon. No bowel dilatation or evidence of obstruction. Multiple gallstones noted. No free air. Degenerative changes of the spine. IMPRESSION: 1. Constipation.  No evidence of bowel obstruction. 2. Cholelithiasis. Electronically Signed   By: Anner Crete M.D.   On: 09/10/2018 06:29     Discharge Exam: Vitals:   09/12/18 2128 09/13/18 0610  BP: (!) 156/70 (!) 162/82  Pulse: (!) 49  97  Resp: 18 18  Temp: 99.1 F (37.3 C) 97.9 F (36.6 C)  SpO2: 99% 97%   Vitals:   09/12/18 1530 09/12/18 1609 09/12/18 2128 09/13/18 0610  BP: 108/61 (!) 159/89 (!) 156/70 (!) 162/82  Pulse:  (!) 44 (!) 49 97  Resp:  20 18 18   Temp:  98 F (36.7 C) 99.1 F (37.3 C) 97.9 F (36.6 C)  TempSrc:  Oral Oral Oral  SpO2:  98% 99% 97%  Weight:  91.8 kg    Height:  6' (1.829 m)      General: Pt is alert, awake, not in acute distress Cardiovascular: RRR, S1/S2 +, no rubs, no gallops Respiratory: CTA bilaterally, no wheezing, no rhonchi Abdominal: Soft, NT, ND, bowel sounds + Extremities: no edema, no cyanosis    The results of significant diagnostics from this hospitalization (including imaging, microbiology, ancillary and laboratory) are listed below for reference.     Microbiology: No results found for this or any previous visit (from the past 240 hour(s)).   Labs: BNP (last 3 results) No results for input(s): BNP in the last 8760 hours. Basic Metabolic Panel: Recent Labs  Lab  09/10/18 0144 09/11/18 0613 09/12/18 0833 09/13/18 0346  NA 139 138 138 135  K 3.8 4.0 4.0 3.6  CL 103 104 104 103  CO2 26 25 26 25   GLUCOSE 131* 118* 118* 111*  BUN 22 22 23 20   CREATININE 1.17 1.12 1.06 1.01  CALCIUM 8.9 8.2* 8.4* 7.7*  MG  --  2.1  --   --    Liver Function Tests: Recent Labs  Lab 09/10/18 0144 09/11/18 0613 09/12/18 0833 09/13/18 0346  AST 34 22 25 23   ALT 16 13 14 14   ALKPHOS 45 35* 44 45  BILITOT 1.2 1.0 1.4* 0.9  PROT 6.9 5.8* 6.2* 5.4*  ALBUMIN 3.7 3.1* 3.4* 2.8*   Recent Labs  Lab 09/10/18 0144  LIPASE 43   No results for input(s): AMMONIA in the last 168 hours. CBC: Recent Labs  Lab 09/10/18 0144 09/11/18 0613 09/12/18 0833 09/13/18 0346  WBC 8.9 9.4 9.6 8.6  NEUTROABS  --   --  7.5  --   HGB 11.9* 10.3* 10.7* 10.2*  HCT 37.8* 32.6* 33.3* 31.0*  MCV 94.0 95.9 94.3 94.5  PLT 165 154 170 160   Cardiac Enzymes: Recent Labs  Lab 09/10/18 1148 09/12/18 0833 09/12/18 1522 09/12/18 2047 09/13/18 0346  TROPONINI 0.04* 0.04* 0.03* 0.03* 0.03*   BNP: Invalid input(s): POCBNP CBG: No results for input(s): GLUCAP in the last 168 hours. D-Dimer No results for input(s): DDIMER in the last 72 hours. Hgb A1c No results for input(s): HGBA1C in the last 72 hours. Lipid Profile No results for input(s): CHOL, HDL, LDLCALC, TRIG, CHOLHDL, LDLDIRECT in the last 72 hours. Thyroid function studies No results for input(s): TSH, T4TOTAL, T3FREE, THYROIDAB in the last 72 hours.  Invalid input(s): FREET3 Anemia work up No results for input(s): VITAMINB12, FOLATE, FERRITIN, TIBC, IRON, RETICCTPCT in the last 72 hours. Urinalysis    Component Value Date/Time   COLORURINE YELLOW 08/17/2018 1402   APPEARANCEUR CLEAR 08/17/2018 1402   LABSPEC 1.021 08/17/2018 1402   PHURINE 6.0 08/17/2018 1402   GLUCOSEU NEGATIVE 08/17/2018 1402   HGBUR NEGATIVE 08/17/2018 1402   KETONESUR NEGATIVE 08/17/2018 1402   PROTEINUR NEGATIVE 08/17/2018 1402    NITRITE NEGATIVE 08/17/2018 1402   LEUKOCYTESUR NEGATIVE 08/17/2018 1402   Sepsis Labs Invalid input(s): PROCALCITONIN,  WBC,  LACTICIDVEN  Microbiology No results found for this or any previous visit (from the past 240 hour(s)).   Time coordinating discharge: 35 minutes  SIGNED:   Rodena Goldmann, DO [facility] 09/13/2018, 12:03 PM Pager [phone]  If 7PM-7AM, please contact night-coverage [email or web address] Password [email or web address]

## 2018-09-14 DIAGNOSIS — M25562 Pain in left knee: Secondary | ICD-10-CM | POA: Diagnosis not present

## 2018-09-14 DIAGNOSIS — M25461 Effusion, right knee: Secondary | ICD-10-CM | POA: Diagnosis not present

## 2018-09-14 DIAGNOSIS — M25662 Stiffness of left knee, not elsewhere classified: Secondary | ICD-10-CM | POA: Diagnosis not present

## 2018-09-14 DIAGNOSIS — M25561 Pain in right knee: Secondary | ICD-10-CM | POA: Diagnosis not present

## 2018-09-18 ENCOUNTER — Encounter: Payer: Medicare HMO | Admitting: Physician Assistant

## 2018-09-18 ENCOUNTER — Encounter: Payer: Medicare HMO | Admitting: Family Medicine

## 2018-09-18 DIAGNOSIS — M25561 Pain in right knee: Secondary | ICD-10-CM | POA: Diagnosis not present

## 2018-09-18 DIAGNOSIS — M25461 Effusion, right knee: Secondary | ICD-10-CM | POA: Diagnosis not present

## 2018-09-18 DIAGNOSIS — M25562 Pain in left knee: Secondary | ICD-10-CM | POA: Diagnosis not present

## 2018-09-18 DIAGNOSIS — M25662 Stiffness of left knee, not elsewhere classified: Secondary | ICD-10-CM | POA: Diagnosis not present

## 2018-09-19 DIAGNOSIS — M25562 Pain in left knee: Secondary | ICD-10-CM | POA: Diagnosis not present

## 2018-09-19 DIAGNOSIS — M25662 Stiffness of left knee, not elsewhere classified: Secondary | ICD-10-CM | POA: Diagnosis not present

## 2018-09-19 DIAGNOSIS — M25561 Pain in right knee: Secondary | ICD-10-CM | POA: Diagnosis not present

## 2018-09-19 DIAGNOSIS — M25461 Effusion, right knee: Secondary | ICD-10-CM | POA: Diagnosis not present

## 2018-09-20 ENCOUNTER — Ambulatory Visit: Payer: Medicare HMO | Admitting: Family Medicine

## 2018-09-21 ENCOUNTER — Other Ambulatory Visit: Payer: Self-pay

## 2018-09-21 ENCOUNTER — Encounter: Payer: Self-pay | Admitting: Family Medicine

## 2018-09-21 ENCOUNTER — Ambulatory Visit (INDEPENDENT_AMBULATORY_CARE_PROVIDER_SITE_OTHER): Payer: Medicare HMO | Admitting: Family Medicine

## 2018-09-21 VITALS — BP 134/70 | HR 62 | Temp 98.1°F | Resp 14 | Ht 72.0 in | Wt 200.0 lb

## 2018-09-21 DIAGNOSIS — I10 Essential (primary) hypertension: Secondary | ICD-10-CM

## 2018-09-21 DIAGNOSIS — N183 Chronic kidney disease, stage 3 unspecified: Secondary | ICD-10-CM

## 2018-09-21 DIAGNOSIS — Z0000 Encounter for general adult medical examination without abnormal findings: Secondary | ICD-10-CM | POA: Diagnosis not present

## 2018-09-21 DIAGNOSIS — D649 Anemia, unspecified: Secondary | ICD-10-CM

## 2018-09-21 DIAGNOSIS — Z23 Encounter for immunization: Secondary | ICD-10-CM | POA: Diagnosis not present

## 2018-09-21 DIAGNOSIS — E782 Mixed hyperlipidemia: Secondary | ICD-10-CM | POA: Diagnosis not present

## 2018-09-21 NOTE — Patient Instructions (Addendum)
[name] [name] , Thank you for taking time to come for your Medicare Wellness Visit. I appreciate your ongoing commitment to your health goals. Please review the following plan we discussed and let me know if I can assist you in the future.   These are the goals we discussed:   This is a list of the screening recommended for you and due dates:  Health Maintenance  Topic Date Due  . Flu Shot  06/15/2018  . Tetanus Vaccine  03/14/2025  . Pneumonia vaccines  Completed     Health Maintenance, Male A healthy lifestyle and preventive care is important for your health and wellness. Ask your health care provider about what schedule of regular examinations is right for you. What should I know about weight and diet? Eat a Healthy Diet  Eat plenty of vegetables, fruits, whole grains, low-fat dairy products, and lean protein.  Do not eat a lot of foods high in solid fats, added sugars, or salt.  Maintain a Healthy Weight Regular exercise can help you achieve or maintain a healthy weight. You should:  Do at least 150 minutes of exercise each week. The exercise should increase your heart rate and make you sweat (moderate-intensity exercise).  Do strength-training exercises at least twice a week.  Watch Your Levels of Cholesterol and Blood Lipids  Have your blood tested for lipids and cholesterol every 5 years starting at 79 years of age. If you are at high risk for heart disease, you should start having your blood tested when you are 80 years old. You may need to have your cholesterol levels checked more often if: ? Your lipid or cholesterol levels are high. ? You are older than 79 years of age. ? You are at high risk for heart disease.  What should I know about cancer screening? Many types of cancers can be detected early and may often be prevented. Lung Cancer  You should be screened every year for lung cancer if: ? You are a current smoker who has smoked for at least 30 years. ? You are  a former smoker who has quit within the past 15 years.  Talk to your health care provider about your screening options, when you should start screening, and how often you should be screened.  Colorectal Cancer  Routine colorectal cancer screening usually begins at 79 years of age and should be repeated every 5-10 years until you are 79 years old. You may need to be screened more often if early forms of precancerous polyps or small growths are found. Your health care provider may recommend screening at an earlier age if you have risk factors for colon cancer.  Your health care provider may recommend using home test kits to check for hidden blood in the stool.  A small camera at the end of a tube can be used to examine your colon (sigmoidoscopy or colonoscopy). This checks for the earliest forms of colorectal cancer.  Prostate and Testicular Cancer  Depending on your age and overall health, your health care provider may do certain tests to screen for prostate and testicular cancer.  Talk to your health care provider about any symptoms or concerns you have about testicular or prostate cancer.  Skin Cancer  Check your skin from head to toe regularly.  Tell your health care provider about any new moles or changes in moles, especially if: ? There is a change in a mole's size, shape, or color. ? You have a mole that is larger  than a pencil eraser.  Always use sunscreen. Apply sunscreen liberally and repeat throughout the day.  Protect yourself by wearing long sleeves, pants, a wide-brimmed hat, and sunglasses when outside.  What should I know about heart disease, diabetes, and high blood pressure?  If you are 37-59 years of age, have your blood pressure checked every 3-5 years. If you are 65 years of age or older, have your blood pressure checked every year. You should have your blood pressure measured twice-once when you are at a hospital or clinic, and once when you are not at a hospital  or clinic. Record the average of the two measurements. To check your blood pressure when you are not at a hospital or clinic, you can use: ? An automated blood pressure machine at a pharmacy. ? A home blood pressure monitor.  Talk to your health care provider about your target blood pressure.  If you are between 83-16 years old, ask your health care provider if you should take aspirin to prevent heart disease.  Have regular diabetes screenings by checking your fasting blood sugar level. ? If you are at a normal weight and have a low risk for diabetes, have this test once every three years after the age of 80. ? If you are overweight and have a high risk for diabetes, consider being tested at a younger age or more often.  A one-time screening for abdominal aortic aneurysm (AAA) by ultrasound is recommended for men aged 37-75 years who are current or former smokers. What should I know about preventing infection? Hepatitis B If you have a higher risk for hepatitis B, you should be screened for this virus. Talk with your health care provider to find out if you are at risk for hepatitis B infection. Hepatitis C Blood testing is recommended for:  Everyone born from [birth year] through 1967.  Anyone with known risk factors for hepatitis C.  Sexually Transmitted Diseases (STDs)  You should be screened each year for STDs including gonorrhea and chlamydia if: ? You are sexually active and are younger than 79 years of age. ? You are older than 79 years of age and your health care provider tells you that you are at risk for this type of infection. ? Your sexual activity has changed since you were last screened and you are at an increased risk for chlamydia or gonorrhea. Ask your health care provider if you are at risk.  Talk with your health care provider about whether you are at high risk of being infected with HIV. Your health care provider may recommend a prescription medicine to help prevent HIV  infection.  What else can I do?  Schedule regular health, dental, and eye exams.  Stay current with your vaccines (immunizations).  Do not use any tobacco products, such as cigarettes, chewing tobacco, and e-cigarettes. If you need help quitting, ask your health care provider.  Limit alcohol intake to no more than 2 drinks per day. One drink equals 12 ounces of beer, 5 ounces of wine, or 1 ounces of hard liquor.  Do not use street drugs.  Do not share needles.  Ask your health care provider for help if you need support or information about quitting drugs.  Tell your health care provider if you often feel depressed.  Tell your health care provider if you have ever been abused or do not feel safe at home. This information is not intended to replace advice given to you by your health care provider.  Make sure you discuss any questions you have with your health care provider. Document Released: 04/29/2008 Document Revised: 06/30/2016 Document Reviewed: 08/05/2015 Elsevier Interactive Patient Education  [employer].

## 2018-09-21 NOTE — Progress Notes (Signed)
Subjective:   [name] [name] is a 77 y.o. male who presents for Medicare Annual/Subsequent preventive examination.  Patient recently had left knee surgery, and shortly afterwards had 2 hospitalizations one for chest pain and one for abdominal pain.  He did not tolerate oral iron supplementation/replacement.  09/05/18 left TKA - Dr. [name], doing PT/rehab, still swelling, working on ROM Admitted 09/10/18 projectile vomiting Admitted 09/12/18 atypical CP  Anemia with surgery and hospitalization but pt denies current CP, tachycardia, SOB, pallor, fatigue.    Review of Systems:  Review of Systems  Constitutional: Negative.  Negative for activity change, appetite change, chills, diaphoresis, fatigue, fever and unexpected weight change.  HENT: Negative.   Eyes: Negative.   Respiratory: Negative.  Negative for shortness of breath.   Cardiovascular: Negative.  Negative for chest pain, palpitations and leg swelling.  Gastrointestinal: Negative.  Negative for abdominal pain and blood in stool.  Endocrine: Negative.   Genitourinary: Negative.  Negative for decreased urine volume, difficulty urinating, testicular pain and urgency.  Skin: Negative.  Negative for color change and pallor.  Allergic/Immunologic: Negative.   Neurological: Negative.  Negative for syncope, weakness, light-headedness and numbness.  Psychiatric/Behavioral: Negative.  Negative for confusion, dysphoric mood, self-injury and suicidal ideas. The patient is not nervous/anxious.   All other systems reviewed and are negative.   Cardiac Risk Factors include: advanced age (>21men, >9 women);hypertension;male gender;dyslipidemia;family history of premature cardiovascular disease     Objective:    Vitals: BP 134/70   Pulse 62   Temp 98.1 F (36.7 C) (Oral)   Resp 14   Ht 6' (1.829 m)   Wt 200 lb (90.7 kg)   SpO2 100%   BMI 27.12 kg/m   Body mass index is 27.12 kg/m.  Advanced Directives 09/21/2018 09/12/2018 09/12/2018  09/10/2018 09/10/2018 09/05/2018 08/29/2018  Does Patient Have a Medical Advance Directive? No No No No No No No  Type of Advance Directive - - - - - - -  Would patient like information on creating a medical advance directive? No - Patient declined No - Patient declined No - Patient declined No - Patient declined No - Patient declined No - Patient declined No - Patient declined  Pre-existing out of facility DNR order (yellow form or pink MOST form) - - - - - - -    Tobacco Social History   Tobacco Use  Smoking Status Former Smoker  . Packs/day: 2.00  . Years: 10.00  . Pack years: 20.00  . Types: Cigarettes, Cigars  . Last attempt to quit: 11/15/1981  . Years since quitting: 36.8  Smokeless Tobacco Never Used     Counseling given: Not Answered   Clinical Intake:  Pre-visit preparation completed: Yes  Pain : 0-10 Pain Score: 3  Pain Type: Other (Comment)(knee pain S/P surgery) Pain Location: Knee Pain Orientation: Left, Mid Pain Descriptors / Indicators: Aching Pain Onset: Other (comment)(since surgery) Pain Frequency: Intermittent     BMI - recorded: 27 Nutritional Status: BMI 25 -29 Overweight Diabetes: No  How often do you need to have someone help you when you read instructions, pamphlets, or other written materials from your doctor or pharmacy?: 1 - Never What is the last grade level you completed in school?: 12TH  Interpreter Needed?: No  Information entered by :: [employer], LPN  Past Medical History:  Diagnosis Date  . Arthritis    shoulders and ribs   . Atrial fibrillation ([place])    atrial fib/ LOV [name] PA 04/11/12 EPIC, -  CHEST X RAY, EKG 5/13 EPIC  . CAD (coronary artery disease)     s/p NSTEMI 04/08;  [place] 02/2007: Proximal LAD 75%, mid LAD 99%, proximal RCA 25%.  PCI:  Cypher DES to the proximal and mid LAD.  Last nuclear study 11/2011 (after a trip to the ED with CP): EF 58%, low risk study with small inferior wall infarct at the mid and basal level, no  ischemia.  Last echo 01/2010: Mild LVH, EF 55-60%, mild AI, mild MR, severely dilated LA and RA  . Contrast dye induced nephropathy    Hx ARF secondary to contrast nephropathy  . Dyslipidemia   . Epididymitis   . History of blood transfusion   . Myocardial infarction ([place]) 2008  . Pleurisy    2012  . Pneumonia    hx of   . 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 Spinal stenosis   . 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 [name],  skin cancer non melanoma   Past Surgical History:  Procedure Laterality Date  . arm surgery     orif right elbow  . CARDIAC CATHETERIZATION  2008  . coronary stents     . CYSTECTOMY  07/15/08  . CYSTOSCOPY WITH BIOPSY  03/23/2012   Procedure: CYSTOSCOPY WITH BIOPSY;  Surgeon: [name], MD;  Location: [facility];  Service: Urology;  Laterality: Right;   BRUSH BIOPSY RIGHT URETERAL STENT  . CYSTOSCOPY WITH URETEROSCOPY  03/23/2012   Procedure: CYSTOSCOPY WITH URETEROSCOPY;  Surgeon: [name], MD;  Location: [facility];  Service: Urology;  Laterality: Right;    **OR Room #8 requested**  C-ARM   . CYSTOSCOPY/RETROGRADE/URETEROSCOPY  02/15/2012   Procedure: CYSTOSCOPY/RETROGRADE/URETEROSCOPY;  Surgeon: [name], MD;  Location: [facility];  Service: Urology;  Laterality: Right;  C-ARM  . JOINT REPLACEMENT     Dr. [name] 09-05-18   . left pinky finger      tip removed from accident  . right kidney removed      2013  . TOTAL KNEE ARTHROPLASTY Left 09/05/2018   Procedure: LEFT TOTAL KNEE ARTHROPLASTY;  Surgeon: [name], MD;  Location: [facility];  Service: Orthopedics;  Laterality: Left;  70 mins with abductor block  . URETER SURGERY     Family History  Problem Relation Age of Onset  . Cancer Mother   . Diabetes type II Mother   . Hyperlipidemia Mother   . Coronary artery disease Father   . Heart attack Father    Social History   Socioeconomic History  . Marital status: Married    Spouse name: [name]  . Number of children: 3  . Years of education: 36  . Highest  education level: Not on file  Occupational History  . Occupation: Retired  Scientific laboratory technician  . Financial resource strain: Not on file  . Food insecurity:    Worry: Not on file    Inability: Not on file  . Transportation needs:    Medical: Not on file    Non-medical: Not on file  Tobacco Use  . Smoking status: Former Smoker    Packs/day: 2.00    Years: 10.00    Pack years: 20.00    Types: Cigarettes, Cigars    Last attempt to quit: 11/15/1981    Years since quitting: 36.8  . Smokeless tobacco: Never Used  Substance and Sexual Activity  . Alcohol use: No  . Drug use: No  . Sexual activity: Not Currently  Lifestyle  . Physical  activity:    Days per week: Not on file    Minutes per session: Not on file  . Stress: Not on file  Relationships  . Social connections:    Talks on phone: Not on file    Gets together: Not on file    Attends religious service: Not on file    Active member of club or organization: Not on file    Attends meetings of clubs or organizations: Not on file    Relationship status: Not on file  Other Topics Concern  . Not on file  Social History Narrative   [place], [place] with wife. Exercising 3-4x/week.     Outpatient Encounter Medications as of 09/21/2018  Medication Sig  . aspirin (ASPIRIN CHILDRENS) 81 MG chewable tablet Chew 1 tablet (81 mg total) by mouth 2 (two) times daily. Take for 4 weeks, then resume regular dose.  Marland Kitchen atorvastatin (LIPITOR) 40 MG tablet Take 1 tablet (40 mg total) by mouth every evening.  . calcitRIOL (ROCALTROL) 0.5 MCG capsule Take 0.5 mcg by mouth daily.  Marland Kitchen docusate sodium (COLACE) 100 MG capsule Take 1 capsule (100 mg total) by mouth 2 (two) times daily.  . furosemide (LASIX) 40 MG tablet Take 1 tablet (40 mg total) by mouth daily.  Marland Kitchen HYDROcodone-acetaminophen (NORCO) 7.5-325 MG tablet Take 1-2 tablets by mouth every 4 (four) hours as needed for moderate pain.  . Inositol Niacinate (NIACIN FLUSH FREE) 500 MG CAPS Take  500 mg by mouth daily.   . methocarbamol (ROBAXIN) 500 MG tablet Take 1 tablet (500 mg total) by mouth every 6 (six) hours as needed for muscle spasms.  Marland Kitchen NITROSTAT 0.4 MG SL tablet Place 1 tablet (0.4 mg total) under the tongue every 5 (five) minutes as needed for chest pain.  . Omega-3 Fatty Acids (FISH OIL) 1200 MG CAPS Take 1,200 mg by mouth daily.   . ondansetron (ZOFRAN) 4 MG tablet Take 1 tablet (4 mg total) by mouth every 6 (six) hours as needed for nausea.  . pantoprazole (PROTONIX) 40 MG tablet Take 1 tablet (40 mg total) by mouth daily.  . polyethylene glycol (MIRALAX / GLYCOLAX) packet Take 17 g by mouth 2 (two) times daily.  . sodium phosphate (FLEET) 7-19 GM/118ML ENEM Place 133 mLs (1 enema total) rectally daily as needed for severe constipation.  . vitamin B-12 (CYANOCOBALAMIN) 500 MCG tablet Take 500 mcg by mouth daily.   No facility-administered encounter medications on file as of 09/21/2018.     Activities of Daily Living In your present state of health, do you have any difficulty performing the following activities: 09/21/2018 09/12/2018  Hearing? Tempie Donning  Vision? N N  Difficulty concentrating or making decisions? N N  Walking or climbing stairs? Y Y  Comment post/op left knee surgery in rehab/pt -  Dressing or bathing? N N  Doing errands, shopping? N N  Preparing Food and eating ? N -  Using the Toilet? N -  In the past six months, have you accidently leaked urine? N -  Do you have problems with loss of bowel control? N -  Managing your Medications? N -  Managing your Finances? N -  Housekeeping or managing your Housekeeping? N -  Some recent data might be hidden   Ortho -  [facility] - [place] -  Patient Care Team: Alycia Rossetti, MD as PCP - General (Family Medicine) Sherren Mocha, MD as PCP - Cardiology (Cardiology)  Assessment:   This is a routine wellness examination for Seon.  Exercise Activities and Dietary  recommendations Current Exercise Habits: Structured exercise class(pt/rehab), Type of exercise: walking, Time (Minutes): 40, Frequency (Times/Week): 6, Weekly Exercise (Minutes/Week): 240, Intensity: Moderate, Exercise limited by: Other - see comments(S/P SURGERY)  Goals   None     Fall Risk Fall Risk  09/21/2018 08/17/2018 09/14/2017 07/12/2017 01/26/2017  Falls in the past year? 0 No Yes No Yes  Number falls in past yr: - - 1 - 1  Injury with Fall? - - No - No  Follow up Falls evaluation completed - - - -   Is the patient's home free of loose throw rugs in walkways, pet beds, electrical cords, etc?   yes      Grab bars in the bathroom? yes      Handrails on the stairs?   yes      Adequate lighting?   yes    Depression Screen PHQ 2/9 Scores 09/21/2018 08/17/2018 09/14/2017 07/12/2017  PHQ - 2 Score 0 0 2 0  PHQ- 9 Score - - 4 -    Cognitive Function     6CIT Screen 09/28/2018  What Year? 0 points  What month? 0 points  What time? 0 points  Count back from 20 0 points  Months in reverse 0 points  Repeat phrase 0 points  Total Score 0    Immunization History  Administered Date(s) Administered  . Influenza, High Dose Seasonal PF 09/14/2017, 09/21/2018  . Influenza-Unspecified 07/27/2016  . Pneumococcal Conjugate-13 06/20/2015  . Tdap 03/15/2015  . Zoster 10/01/2011    Qualifies for Shingles Vaccine? No - UTD  Screening Tests Health Maintenance  Topic Date Due  . TETANUS/TDAP  03/14/2025  . INFLUENZA VACCINE  Completed  . PNA vac Low Risk Adult  Completed   Cancer Screenings: Lung: Low Dose CT Chest recommended if Age 32-80 years, 30 pack-year currently smoking OR have quit w/in 15years. Patient does not qualify. Colorectal: last done 11/04/16       Plan:   77 y/o caucasian male here for [place], did recently have preop physical here and with cardiology for recent left knee surgery.    Problem List Items Addressed This Visit      Genitourinary   CKD (chronic  kidney disease), stage III ([place])   Relevant Orders   COMPLETE METABOLIC PANEL WITH GFR (Completed)     Other   Mixed hyperlipidemia   Relevant Orders   Lipid Panel (Completed)   CBC with Differential/Platelet (Completed)   COMPLETE METABOLIC PANEL WITH GFR (Completed)    Other Visit Diagnoses    Encounter for Medicare annual wellness exam    -  Primary   Hypertension, unspecified type       Relevant Orders   COMPLETE METABOLIC PANEL WITH GFR (Completed)   Anemia, unspecified type       Relevant Orders   CBC with Differential/Platelet (Completed)   Encounter for immunization       Relevant Orders   Flu vaccine HIGH DOSE PF (Completed)     Follow-up on anemia from the hospital, Does not tolerate oral iron supplementation so will need to monitor anemia, likely need serial CBCs for a few months HTN/HLD - recheck fasting lipids, renal function, liver function PT is tolerating blood pressure medications and statins Pre-op labs on 08/17/18 showed mild CKD stage 3, recheck and monitor GERD and abd pain - stopped oral iron, atypical CP and abd pain with N/V  suspected GERD, tx with protonix, sx improved, no CP today  Pt currently a fall risk, but he is in PT and rehab   I have personally reviewed and noted the following in the patient's chart:   . Medical and social history . Use of alcohol, tobacco or illicit drugs  . Current medications and supplements . Functional ability and status . Nutritional status . Physical activity . Advanced directives - pt refuses . List of other physicians . Hospitalizations, surgeries, and ER visits in previous 12 months . Vitals . Screenings to include cognitive, depression, and falls . Referrals and appointments  In addition, I have reviewed and discussed with patient certain preventive protocols, quality metrics, and best practice recommendations. A written personalized care plan for preventive services as well as general preventive health  recommendations were provided to patient.     Delsa Grana, PA-C  09/28/2018

## 2018-09-21 NOTE — Progress Notes (Deleted)
Patient ID: [name] [name], male    DOB: [birth year], 77 y.o.   MRN: [id number]  PCP: Alycia Rossetti, MD  Chief Complaint  Patient presents with  . Medicare Wellness    is fasting    Subjective:   [name] [name] is a 77 y.o. male, presents to clinic with CC of abdominal pain, chest pain     Patient Active Problem List   Diagnosis Date Noted  . Chest pain 09/12/2018  . Nausea & vomiting 09/10/2018  . N&V (nausea and vomiting) 09/10/2018  . Constipation 09/10/2018  . Overweight (BMI 25.0-29.9) 09/06/2018  . S/P left TKA 09/05/2018  . 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 Elevated PSA 09/14/2017  . Primary osteoarthritis of left knee 01/27/2017  . OA (osteoarthritis) of knee 01/26/2017  . Spinal stenosis of lumbar region 12/08/2016  . GERD (gastroesophageal reflux disease) 11/26/2011  . Mixed hyperlipidemia 10/05/2008  . CAD (coronary artery disease) 10/05/2008  . Atrial fibrillation ([place]) 10/05/2008  . CKD (chronic kidney disease), stage III ([place]) 10/05/2008     Prior to Admission medications   Medication Sig Start Date End Date Taking? Authorizing Provider  aspirin (ASPIRIN CHILDRENS) 81 MG chewable tablet Chew 1 tablet (81 mg total) by mouth 2 (two) times daily. Take for 4 weeks, then resume regular dose. 09/06/18 10/06/18 Yes Babish, Rodman Key, PA-C  atorvastatin (LIPITOR) 40 MG tablet Take 1 tablet (40 mg total) by mouth every evening. 12/01/17  Yes Kaylor, [name], MD  calcitRIOL (ROCALTROL) 0.5 MCG capsule Take 0.5 mcg by mouth daily.   Yes [provider]  docusate sodium (COLACE) 100 MG capsule Take 1 capsule (100 mg total) by mouth 2 (two) times daily. 09/05/18  Yes Babish, Rodman Key, PA-C  furosemide (LASIX) 40 MG tablet Take 1 tablet (40 mg total) by mouth daily. 12/01/17  Yes [place], [name], MD  HYDROcodone-acetaminophen (NORCO) 7.5-325 MG tablet Take 1-2 tablets by mouth every 4 (four) hours as needed for moderate pain. 09/05/18  Yes Babish, Rodman Key, PA-C    Inositol Niacinate (NIACIN FLUSH FREE) 500 MG CAPS Take 500 mg by mouth daily.    Yes [provider]  methocarbamol (ROBAXIN) 500 MG tablet Take 1 tablet (500 mg total) by mouth every 6 (six) hours as needed for muscle spasms. 09/05/18  Yes Babish, Matthew, PA-C  NITROSTAT 0.4 MG SL tablet Place 1 tablet (0.4 mg total) under the tongue every 5 (five) minutes as needed for chest pain. 09/18/15  Yes Isaiah Serge, NP  Omega-3 Fatty Acids (FISH OIL) 1200 MG CAPS Take 1,200 mg by mouth daily.    Yes [provider]  ondansetron (ZOFRAN) 4 MG tablet Take 1 tablet (4 mg total) by mouth every 6 (six) hours as needed for nausea. 09/13/18  Yes Shah, Pratik D, DO  pantoprazole (PROTONIX) 40 MG tablet Take 1 tablet (40 mg total) by mouth daily. 09/14/18 10/14/18 Yes Shah, Pratik D, DO  polyethylene glycol (MIRALAX / GLYCOLAX) packet Take 17 g by mouth 2 (two) times daily. 09/11/18  Yes Kathie Dike, MD  sodium phosphate (FLEET) 7-19 GM/118ML ENEM Place 133 mLs (1 enema total) rectally daily as needed for severe constipation. 09/11/18  Yes Kathie Dike, MD  vitamin B-12 (CYANOCOBALAMIN) 500 MCG tablet Take 500 mcg by mouth daily.   Yes [provider]     Allergies  Allergen Reactions  . Contrast Media [Iodinated Diagnostic Agents]     Contrast-induced nephropathy requiring dialysis in 02/2007.  . Iodine-131 Other (See  Comments)    Kidneys shut down  . Other Other (See Comments)    Beta blockers cause bradycardia  . Promethazine Other (See Comments)    Pt became drowsy and mild altered mental status     Family History  Problem Relation Age of Onset  . Cancer Mother   . Diabetes type II Mother   . Hyperlipidemia Mother   . Coronary artery disease Father   . Heart attack Father      Social History   Socioeconomic History  . Marital status: Married    Spouse name: [name]  . Number of children: 3  . Years of education: 44  . Highest education level: Not  on file  Occupational History  . Occupation: Retired  Scientific laboratory technician  . Financial resource strain: Not on file  . Food insecurity:    Worry: Not on file    Inability: Not on file  . Transportation needs:    Medical: Not on file    Non-medical: Not on file  Tobacco Use  . Smoking status: Former Smoker    Packs/day: 2.00    Years: 10.00    Pack years: 20.00    Types: Cigarettes, Cigars    Last attempt to quit: 11/15/1981    Years since quitting: 36.8  . Smokeless tobacco: Never Used  Substance and Sexual Activity  . Alcohol use: No  . Drug use: No  . Sexual activity: Not Currently  Lifestyle  . Physical activity:    Days per week: Not on file    Minutes per session: Not on file  . Stress: Not on file  Relationships  . Social connections:    Talks on phone: Not on file    Gets together: Not on file    Attends religious service: Not on file    Active member of club or organization: Not on file    Attends meetings of clubs or organizations: Not on file    Relationship status: Not on file  . Intimate partner violence:    Fear of current or ex partner: Not on file    Emotionally abused: Not on file    Physically abused: Not on file    Forced sexual activity: Not on file  Other Topics Concern  . Not on file  Social History Narrative   Lives in [place], [place] with wife. Exercising 3-4x/week.      Review of Systems     Objective:    Vitals:   09/21/18 0929  BP: 134/70  Pulse: 62  Resp: 14  Temp: 98.1 F (36.7 C)  TempSrc: Oral  SpO2: 100%  Weight: 200 lb (90.7 kg)  Height: 6' (1.829 m)      Physical Exam        Assessment & Plan:    No diagnosis found.     Delsa Grana, PA-C 09/21/18 9:40 AM

## 2018-09-22 DIAGNOSIS — M25561 Pain in right knee: Secondary | ICD-10-CM | POA: Diagnosis not present

## 2018-09-22 DIAGNOSIS — M25562 Pain in left knee: Secondary | ICD-10-CM | POA: Diagnosis not present

## 2018-09-22 DIAGNOSIS — M25461 Effusion, right knee: Secondary | ICD-10-CM | POA: Diagnosis not present

## 2018-09-22 DIAGNOSIS — M25662 Stiffness of left knee, not elsewhere classified: Secondary | ICD-10-CM | POA: Diagnosis not present

## 2018-09-22 LAB — LIPID PANEL
CHOL/HDL RATIO: 3.2 (calc) (ref <5.0)
Cholesterol: 119 mg/dL (ref <200)
HDL: 37 mg/dL — AB (ref >40)
LDL Cholesterol (Calc): 65 mg/dL (calc)
NON-HDL CHOLESTEROL (CALC): 82 mg/dL (ref <130)
TRIGLYCERIDES: 86 mg/dL (ref <150)

## 2018-09-22 LAB — CBC WITH DIFFERENTIAL/PLATELET
BASOS ABS: 68 {cells}/uL (ref 0–200)
Basophils Relative: 1 %
EOS PCT: 1.3 %
Eosinophils Absolute: 88 cells/uL (ref 15–500)
HCT: 32.7 % — ABNORMAL LOW (ref 38.5–50.0)
Hemoglobin: 10.7 g/dL — ABNORMAL LOW (ref 13.2–17.1)
Lymphs Abs: 809 cells/uL — ABNORMAL LOW (ref 850–3900)
MCH: 30.7 pg (ref 27.0–33.0)
MCHC: 32.7 g/dL (ref 32.0–36.0)
MCV: 93.7 fL (ref 80.0–100.0)
MONOS PCT: 10.6 %
MPV: 10.9 fL (ref 7.5–12.5)
NEUTROS PCT: 75.2 %
Neutro Abs: 5114 cells/uL (ref 1500–7800)
Platelets: 226 10*3/uL (ref 140–400)
RBC: 3.49 10*6/uL — ABNORMAL LOW (ref 4.20–5.80)
RDW: 13.3 % (ref 11.0–15.0)
Total Lymphocyte: 11.9 %
WBC mixed population: 721 cells/uL (ref 200–950)
WBC: 6.8 10*3/uL (ref 3.8–10.8)

## 2018-09-22 LAB — COMPLETE METABOLIC PANEL WITH GFR
AG Ratio: 1.6 (calc) (ref 1.0–2.5)
ALKALINE PHOSPHATASE (APISO): 66 U/L (ref 40–115)
ALT: 9 U/L (ref 9–46)
AST: 17 U/L (ref 10–35)
Albumin: 3.7 g/dL (ref 3.6–5.1)
BILIRUBIN TOTAL: 0.9 mg/dL (ref 0.2–1.2)
BUN/Creatinine Ratio: 12 (calc) (ref 6–22)
BUN: 15 mg/dL (ref 7–25)
CHLORIDE: 103 mmol/L (ref 98–110)
CO2: 26 mmol/L (ref 20–32)
CREATININE: 1.27 mg/dL — AB (ref 0.70–1.18)
Calcium: 8.6 mg/dL (ref 8.6–10.3)
GFR, Est African American: 62 mL/min/{1.73_m2} (ref >=60)
GFR, Est Non African American: 53 mL/min/{1.73_m2} — ABNORMAL LOW (ref >=60)
GLUCOSE: 102 mg/dL — AB (ref 65–99)
Globulin: 2.3 g/dL (calc) (ref 1.9–3.7)
Potassium: 4.2 mmol/L (ref 3.5–5.3)
SODIUM: 139 mmol/L (ref 135–146)
Total Protein: 6 g/dL — ABNORMAL LOW (ref 6.1–8.1)

## 2018-09-25 DIAGNOSIS — M25461 Effusion, right knee: Secondary | ICD-10-CM | POA: Diagnosis not present

## 2018-09-25 DIAGNOSIS — M25561 Pain in right knee: Secondary | ICD-10-CM | POA: Diagnosis not present

## 2018-09-25 DIAGNOSIS — M25662 Stiffness of left knee, not elsewhere classified: Secondary | ICD-10-CM | POA: Diagnosis not present

## 2018-09-25 DIAGNOSIS — M25562 Pain in left knee: Secondary | ICD-10-CM | POA: Diagnosis not present

## 2018-09-26 ENCOUNTER — Telehealth: Payer: Self-pay | Admitting: Family Medicine

## 2018-09-26 ENCOUNTER — Other Ambulatory Visit: Payer: Self-pay | Admitting: *Deleted

## 2018-09-26 DIAGNOSIS — D649 Anemia, unspecified: Secondary | ICD-10-CM

## 2018-09-26 DIAGNOSIS — N183 Chronic kidney disease, stage 3 unspecified: Secondary | ICD-10-CM

## 2018-09-26 NOTE — Telephone Encounter (Signed)
Patient calling with questions regarding his lab work would like some further explanation [phone] (M)

## 2018-09-26 NOTE — Telephone Encounter (Signed)
Call placed to patient. LMTRC.  

## 2018-09-27 DIAGNOSIS — M25461 Effusion, right knee: Secondary | ICD-10-CM | POA: Diagnosis not present

## 2018-09-27 DIAGNOSIS — M25562 Pain in left knee: Secondary | ICD-10-CM | POA: Diagnosis not present

## 2018-09-27 DIAGNOSIS — M25561 Pain in right knee: Secondary | ICD-10-CM | POA: Diagnosis not present

## 2018-09-27 DIAGNOSIS — M25662 Stiffness of left knee, not elsewhere classified: Secondary | ICD-10-CM | POA: Diagnosis not present

## 2018-09-27 NOTE — Telephone Encounter (Signed)
Patient returned call and discussed labs.

## 2018-09-28 ENCOUNTER — Encounter: Payer: Self-pay | Admitting: Family Medicine

## 2018-09-28 DIAGNOSIS — M25461 Effusion, right knee: Secondary | ICD-10-CM | POA: Diagnosis not present

## 2018-09-28 DIAGNOSIS — M25662 Stiffness of left knee, not elsewhere classified: Secondary | ICD-10-CM | POA: Diagnosis not present

## 2018-09-28 DIAGNOSIS — M25562 Pain in left knee: Secondary | ICD-10-CM | POA: Diagnosis not present

## 2018-09-28 DIAGNOSIS — M25561 Pain in right knee: Secondary | ICD-10-CM | POA: Diagnosis not present

## 2018-10-02 ENCOUNTER — Ambulatory Visit: Payer: Medicare HMO | Admitting: Cardiovascular Disease

## 2018-10-02 DIAGNOSIS — M25461 Effusion, right knee: Secondary | ICD-10-CM | POA: Diagnosis not present

## 2018-10-02 DIAGNOSIS — M25562 Pain in left knee: Secondary | ICD-10-CM | POA: Diagnosis not present

## 2018-10-02 DIAGNOSIS — M25561 Pain in right knee: Secondary | ICD-10-CM | POA: Diagnosis not present

## 2018-10-02 DIAGNOSIS — M25662 Stiffness of left knee, not elsewhere classified: Secondary | ICD-10-CM | POA: Diagnosis not present

## 2018-10-03 DIAGNOSIS — C661 Malignant neoplasm of right ureter: Secondary | ICD-10-CM | POA: Diagnosis not present

## 2018-10-03 DIAGNOSIS — C669 Malignant neoplasm of unspecified ureter: Secondary | ICD-10-CM | POA: Diagnosis not present

## 2018-10-04 ENCOUNTER — Ambulatory Visit: Payer: Medicare HMO | Admitting: Cardiovascular Disease

## 2018-10-04 DIAGNOSIS — M25562 Pain in left knee: Secondary | ICD-10-CM | POA: Diagnosis not present

## 2018-10-04 DIAGNOSIS — M25461 Effusion, right knee: Secondary | ICD-10-CM | POA: Diagnosis not present

## 2018-10-04 DIAGNOSIS — M25561 Pain in right knee: Secondary | ICD-10-CM | POA: Diagnosis not present

## 2018-10-04 DIAGNOSIS — M25662 Stiffness of left knee, not elsewhere classified: Secondary | ICD-10-CM | POA: Diagnosis not present

## 2018-10-06 DIAGNOSIS — M25562 Pain in left knee: Secondary | ICD-10-CM | POA: Diagnosis not present

## 2018-10-06 DIAGNOSIS — M25662 Stiffness of left knee, not elsewhere classified: Secondary | ICD-10-CM | POA: Diagnosis not present

## 2018-10-06 DIAGNOSIS — M25561 Pain in right knee: Secondary | ICD-10-CM | POA: Diagnosis not present

## 2018-10-06 DIAGNOSIS — M25461 Effusion, right knee: Secondary | ICD-10-CM | POA: Diagnosis not present

## 2018-10-08 DIAGNOSIS — M25461 Effusion, right knee: Secondary | ICD-10-CM | POA: Diagnosis not present

## 2018-10-08 DIAGNOSIS — M25562 Pain in left knee: Secondary | ICD-10-CM | POA: Diagnosis not present

## 2018-10-08 DIAGNOSIS — M25662 Stiffness of left knee, not elsewhere classified: Secondary | ICD-10-CM | POA: Diagnosis not present

## 2018-10-08 DIAGNOSIS — M25561 Pain in right knee: Secondary | ICD-10-CM | POA: Diagnosis not present

## 2018-10-09 DIAGNOSIS — M25562 Pain in left knee: Secondary | ICD-10-CM | POA: Diagnosis not present

## 2018-10-09 DIAGNOSIS — M25662 Stiffness of left knee, not elsewhere classified: Secondary | ICD-10-CM | POA: Diagnosis not present

## 2018-10-09 DIAGNOSIS — M25461 Effusion, right knee: Secondary | ICD-10-CM | POA: Diagnosis not present

## 2018-10-09 DIAGNOSIS — M25561 Pain in right knee: Secondary | ICD-10-CM | POA: Diagnosis not present

## 2018-10-11 DIAGNOSIS — M25562 Pain in left knee: Secondary | ICD-10-CM | POA: Diagnosis not present

## 2018-10-11 DIAGNOSIS — M25662 Stiffness of left knee, not elsewhere classified: Secondary | ICD-10-CM | POA: Diagnosis not present

## 2018-10-11 DIAGNOSIS — M25461 Effusion, right knee: Secondary | ICD-10-CM | POA: Diagnosis not present

## 2018-10-11 DIAGNOSIS — M25561 Pain in right knee: Secondary | ICD-10-CM | POA: Diagnosis not present

## 2018-10-16 DIAGNOSIS — M25561 Pain in right knee: Secondary | ICD-10-CM | POA: Diagnosis not present

## 2018-10-16 DIAGNOSIS — M25662 Stiffness of left knee, not elsewhere classified: Secondary | ICD-10-CM | POA: Diagnosis not present

## 2018-10-16 DIAGNOSIS — M25562 Pain in left knee: Secondary | ICD-10-CM | POA: Diagnosis not present

## 2018-10-16 DIAGNOSIS — M25461 Effusion, right knee: Secondary | ICD-10-CM | POA: Diagnosis not present

## 2018-10-18 DIAGNOSIS — M25461 Effusion, right knee: Secondary | ICD-10-CM | POA: Diagnosis not present

## 2018-10-18 DIAGNOSIS — M25662 Stiffness of left knee, not elsewhere classified: Secondary | ICD-10-CM | POA: Diagnosis not present

## 2018-10-18 DIAGNOSIS — M25562 Pain in left knee: Secondary | ICD-10-CM | POA: Diagnosis not present

## 2018-10-18 DIAGNOSIS — M25561 Pain in right knee: Secondary | ICD-10-CM | POA: Diagnosis not present

## 2018-10-19 DIAGNOSIS — M25561 Pain in right knee: Secondary | ICD-10-CM | POA: Diagnosis not present

## 2018-10-19 DIAGNOSIS — M25662 Stiffness of left knee, not elsewhere classified: Secondary | ICD-10-CM | POA: Diagnosis not present

## 2018-10-19 DIAGNOSIS — M25461 Effusion, right knee: Secondary | ICD-10-CM | POA: Diagnosis not present

## 2018-10-19 DIAGNOSIS — M25562 Pain in left knee: Secondary | ICD-10-CM | POA: Diagnosis not present

## 2018-10-19 DIAGNOSIS — Z4789 Encounter for other orthopedic aftercare: Secondary | ICD-10-CM | POA: Diagnosis not present

## 2018-10-23 DIAGNOSIS — M25562 Pain in left knee: Secondary | ICD-10-CM | POA: Diagnosis not present

## 2018-10-23 DIAGNOSIS — M25561 Pain in right knee: Secondary | ICD-10-CM | POA: Diagnosis not present

## 2018-10-23 DIAGNOSIS — M25461 Effusion, right knee: Secondary | ICD-10-CM | POA: Diagnosis not present

## 2018-10-23 DIAGNOSIS — M25662 Stiffness of left knee, not elsewhere classified: Secondary | ICD-10-CM | POA: Diagnosis not present

## 2018-10-25 ENCOUNTER — Other Ambulatory Visit: Payer: Medicare HMO

## 2018-10-25 DIAGNOSIS — M25561 Pain in right knee: Secondary | ICD-10-CM | POA: Diagnosis not present

## 2018-10-25 DIAGNOSIS — N183 Chronic kidney disease, stage 3 unspecified: Secondary | ICD-10-CM

## 2018-10-25 DIAGNOSIS — M25562 Pain in left knee: Secondary | ICD-10-CM | POA: Diagnosis not present

## 2018-10-25 DIAGNOSIS — D649 Anemia, unspecified: Secondary | ICD-10-CM

## 2018-10-25 DIAGNOSIS — M25461 Effusion, right knee: Secondary | ICD-10-CM | POA: Diagnosis not present

## 2018-10-25 DIAGNOSIS — M25662 Stiffness of left knee, not elsewhere classified: Secondary | ICD-10-CM | POA: Diagnosis not present

## 2018-10-26 LAB — BASIC METABOLIC PANEL
BUN/Creatinine Ratio: 10 (calc) (ref 6–22)
BUN: 13 mg/dL (ref 7–25)
CALCIUM: 9.1 mg/dL (ref 8.6–10.3)
CO2: 30 mmol/L (ref 20–32)
Chloride: 102 mmol/L (ref 98–110)
Creat: 1.24 mg/dL — ABNORMAL HIGH (ref 0.70–1.18)
Glucose, Bld: 100 mg/dL — ABNORMAL HIGH (ref 65–99)
Potassium: 4.2 mmol/L (ref 3.5–5.3)
Sodium: 141 mmol/L (ref 135–146)

## 2018-10-26 LAB — CBC WITH DIFFERENTIAL/PLATELET
Basophils Absolute: 57 cells/uL (ref 0–200)
Basophils Relative: 0.9 %
EOS ABS: 246 {cells}/uL (ref 15–500)
Eosinophils Relative: 3.9 %
HEMATOCRIT: 37.5 % — AB (ref 38.5–50.0)
HEMOGLOBIN: 12 g/dL — AB (ref 13.2–17.1)
Lymphs Abs: 844 cells/uL — ABNORMAL LOW (ref 850–3900)
MCH: 30 pg (ref 27.0–33.0)
MCHC: 32 g/dL (ref 32.0–36.0)
MCV: 93.8 fL (ref 80.0–100.0)
MPV: 10.6 fL (ref 7.5–12.5)
Monocytes Relative: 9.3 %
Neutro Abs: 4568 cells/uL (ref 1500–7800)
Neutrophils Relative %: 72.5 %
Platelets: 206 10*3/uL (ref 140–400)
RBC: 4 10*6/uL — ABNORMAL LOW (ref 4.20–5.80)
RDW: 12.6 % (ref 11.0–15.0)
Total Lymphocyte: 13.4 %
WBC mixed population: 586 cells/uL (ref 200–950)
WBC: 6.3 10*3/uL (ref 3.8–10.8)

## 2018-10-30 DIAGNOSIS — M25662 Stiffness of left knee, not elsewhere classified: Secondary | ICD-10-CM | POA: Diagnosis not present

## 2018-10-30 DIAGNOSIS — M25561 Pain in right knee: Secondary | ICD-10-CM | POA: Diagnosis not present

## 2018-10-30 DIAGNOSIS — M25461 Effusion, right knee: Secondary | ICD-10-CM | POA: Diagnosis not present

## 2018-10-30 DIAGNOSIS — M25562 Pain in left knee: Secondary | ICD-10-CM | POA: Diagnosis not present

## 2018-11-03 DIAGNOSIS — M25461 Effusion, right knee: Secondary | ICD-10-CM | POA: Diagnosis not present

## 2018-11-03 DIAGNOSIS — M25562 Pain in left knee: Secondary | ICD-10-CM | POA: Diagnosis not present

## 2018-11-03 DIAGNOSIS — M25662 Stiffness of left knee, not elsewhere classified: Secondary | ICD-10-CM | POA: Diagnosis not present

## 2018-11-03 DIAGNOSIS — M25561 Pain in right knee: Secondary | ICD-10-CM | POA: Diagnosis not present

## 2018-11-07 DIAGNOSIS — M25662 Stiffness of left knee, not elsewhere classified: Secondary | ICD-10-CM | POA: Diagnosis not present

## 2018-11-07 DIAGNOSIS — M25461 Effusion, right knee: Secondary | ICD-10-CM | POA: Diagnosis not present

## 2018-11-07 DIAGNOSIS — M25562 Pain in left knee: Secondary | ICD-10-CM | POA: Diagnosis not present

## 2018-11-07 DIAGNOSIS — M25561 Pain in right knee: Secondary | ICD-10-CM | POA: Diagnosis not present

## 2018-11-10 DIAGNOSIS — M25461 Effusion, right knee: Secondary | ICD-10-CM | POA: Diagnosis not present

## 2018-11-10 DIAGNOSIS — M25562 Pain in left knee: Secondary | ICD-10-CM | POA: Diagnosis not present

## 2018-11-10 DIAGNOSIS — M25561 Pain in right knee: Secondary | ICD-10-CM | POA: Diagnosis not present

## 2018-11-10 DIAGNOSIS — M25662 Stiffness of left knee, not elsewhere classified: Secondary | ICD-10-CM | POA: Diagnosis not present

## 2018-11-13 DIAGNOSIS — M25561 Pain in right knee: Secondary | ICD-10-CM | POA: Diagnosis not present

## 2018-11-13 DIAGNOSIS — M25461 Effusion, right knee: Secondary | ICD-10-CM | POA: Diagnosis not present

## 2018-11-13 DIAGNOSIS — M25562 Pain in left knee: Secondary | ICD-10-CM | POA: Diagnosis not present

## 2018-11-13 DIAGNOSIS — M25662 Stiffness of left knee, not elsewhere classified: Secondary | ICD-10-CM | POA: Diagnosis not present

## 2018-11-16 DIAGNOSIS — M25461 Effusion, right knee: Secondary | ICD-10-CM | POA: Diagnosis not present

## 2018-11-16 DIAGNOSIS — M25562 Pain in left knee: Secondary | ICD-10-CM | POA: Diagnosis not present

## 2018-11-16 DIAGNOSIS — M25662 Stiffness of left knee, not elsewhere classified: Secondary | ICD-10-CM | POA: Diagnosis not present

## 2018-11-16 DIAGNOSIS — M25561 Pain in right knee: Secondary | ICD-10-CM | POA: Diagnosis not present

## 2018-11-17 DIAGNOSIS — D631 Anemia in chronic kidney disease: Secondary | ICD-10-CM | POA: Diagnosis not present

## 2018-11-17 DIAGNOSIS — N2581 Secondary hyperparathyroidism of renal origin: Secondary | ICD-10-CM | POA: Diagnosis not present

## 2018-11-17 DIAGNOSIS — N183 Chronic kidney disease, stage 3 (moderate): Secondary | ICD-10-CM | POA: Diagnosis not present

## 2018-11-17 DIAGNOSIS — I129 Hypertensive chronic kidney disease with stage 1 through stage 4 chronic kidney disease, or unspecified chronic kidney disease: Secondary | ICD-10-CM | POA: Diagnosis not present

## 2018-11-20 DIAGNOSIS — M25561 Pain in right knee: Secondary | ICD-10-CM | POA: Diagnosis not present

## 2018-11-20 DIAGNOSIS — M25562 Pain in left knee: Secondary | ICD-10-CM | POA: Diagnosis not present

## 2018-11-20 DIAGNOSIS — M25662 Stiffness of left knee, not elsewhere classified: Secondary | ICD-10-CM | POA: Diagnosis not present

## 2018-11-20 DIAGNOSIS — M25461 Effusion, right knee: Secondary | ICD-10-CM | POA: Diagnosis not present

## 2018-11-22 DIAGNOSIS — M25461 Effusion, right knee: Secondary | ICD-10-CM | POA: Diagnosis not present

## 2018-11-22 DIAGNOSIS — M25662 Stiffness of left knee, not elsewhere classified: Secondary | ICD-10-CM | POA: Diagnosis not present

## 2018-11-22 DIAGNOSIS — M25562 Pain in left knee: Secondary | ICD-10-CM | POA: Diagnosis not present

## 2018-11-22 DIAGNOSIS — M25561 Pain in right knee: Secondary | ICD-10-CM | POA: Diagnosis not present

## 2019-01-02 ENCOUNTER — Other Ambulatory Visit: Payer: Self-pay | Admitting: Family Medicine

## 2019-01-03 ENCOUNTER — Telehealth: Payer: Self-pay | Admitting: *Deleted

## 2019-01-03 NOTE — Telephone Encounter (Signed)
   [facility] Pre-operative Risk Assessment    Request for surgical clearance:  1. What type of surgery is being performed? RIGHT TOTAL KNEE   2. When is this surgery scheduled? 02/20/19   3. What type of clearance is required (medical clearance vs. Pharmacy clearance to hold med vs. Both)? MEDICAL  4. Are there any medications that need to be held prior to surgery and how long?ASA HOLD 7 DAYS PRIOR; THOUGH I DO NOT SEE ASA ON MED LIST  5. Practice name and name of physician performing surgery? [facility]; DR. [name]   6. What is your office phone number [phone]    7.   What is your office fax number [phone]  8.   Anesthesia type (None, local, MAC, general) ? SPINAL   [name] [name] 01/03/2019, 12:45 PM  _________________________________________________________________   (provider comments below)

## 2019-01-03 NOTE — Telephone Encounter (Signed)
   Primary Cardiologist: Sherren Mocha, MD  Chart reviewed as part of pre-operative protocol coverage. Left voice mail to call back.   [place], [place] 01/03/2019, 2:03 PM

## 2019-01-04 NOTE — Telephone Encounter (Signed)
Pt returned [place] call

## 2019-01-04 NOTE — Telephone Encounter (Signed)
Pt is returning call from [facility], [place] for Pre Op.

## 2019-01-04 NOTE — Telephone Encounter (Signed)
   Primary Cardiologist: Sherren Mocha, MD  Chart reviewed as part of pre-operative protocol coverage. Patient was contacted 01/04/2019 in reference to pre-operative risk assessment for pending surgery as outlined below.  Tuan Cansler was last seen on 08/18/18 by Gilberto Better, PA-C, for clearance for left TKR. He is now needing right TKR. He was cleared in October and Dr. Copper cleared him to hold ASA 7 days prior to surgery.  Since that day, Demarkis Gheen has continued to do well from a cardiac standpoint. He denies anginal symptomatology. No exertional CP or dyspnea.   Therefore, based on ACC/AHA guidelines, the patient would be at acceptable risk for the planned procedure without further cardiovascular testing.   Ok to hold ASA 7 days prior to surgery.   I will route this recommendation to the requesting party via Epic fax function and remove from pre-op pool.  Please call with questions.  Lyda Jester, PA-C 01/04/2019, 9:16 AM

## 2019-01-10 ENCOUNTER — Ambulatory Visit (INDEPENDENT_AMBULATORY_CARE_PROVIDER_SITE_OTHER): Payer: Medicare HMO | Admitting: Family Medicine

## 2019-01-10 ENCOUNTER — Encounter: Payer: Self-pay | Admitting: Family Medicine

## 2019-01-10 ENCOUNTER — Other Ambulatory Visit: Payer: Self-pay

## 2019-01-10 VITALS — BP 138/76 | HR 66 | Temp 97.9°F | Resp 16 | Ht 72.0 in | Wt 196.0 lb

## 2019-01-10 DIAGNOSIS — Z7189 Other specified counseling: Secondary | ICD-10-CM | POA: Diagnosis not present

## 2019-01-10 DIAGNOSIS — E782 Mixed hyperlipidemia: Secondary | ICD-10-CM | POA: Diagnosis not present

## 2019-01-10 DIAGNOSIS — Z01818 Encounter for other preprocedural examination: Secondary | ICD-10-CM | POA: Diagnosis not present

## 2019-01-10 DIAGNOSIS — D649 Anemia, unspecified: Secondary | ICD-10-CM | POA: Diagnosis not present

## 2019-01-10 DIAGNOSIS — Z905 Acquired absence of kidney: Secondary | ICD-10-CM | POA: Diagnosis not present

## 2019-01-10 DIAGNOSIS — N183 Chronic kidney disease, stage 3 unspecified: Secondary | ICD-10-CM

## 2019-01-10 DIAGNOSIS — I2510 Atherosclerotic heart disease of native coronary artery without angina pectoris: Secondary | ICD-10-CM | POA: Diagnosis not present

## 2019-01-10 DIAGNOSIS — K219 Gastro-esophageal reflux disease without esophagitis: Secondary | ICD-10-CM | POA: Diagnosis not present

## 2019-01-10 LAB — URINALYSIS, ROUTINE W REFLEX MICROSCOPIC
BACTERIA UA: NONE SEEN /HPF
BILIRUBIN URINE: NEGATIVE
Glucose, UA: NEGATIVE
KETONES UR: NEGATIVE
LEUKOCYTE UA: NEGATIVE
Nitrite: NEGATIVE
Protein, ur: NEGATIVE
RBC / HPF: NONE SEEN /HPF (ref 0–2)
Specific Gravity, Urine: 1.02 (ref 1.001–1.03)
Squamous Epithelial / LPF: NONE SEEN /HPF (ref <=5)
WBC UA: NONE SEEN /HPF (ref 0–5)
pH: 7 (ref 5.0–8.0)

## 2019-01-10 LAB — MICROSCOPIC MESSAGE

## 2019-01-10 NOTE — Assessment & Plan Note (Signed)
Solo functioning kidney, check renal function ensure stable before surgery

## 2019-01-10 NOTE — Patient Instructions (Addendum)
F/U November for PHYSICAL

## 2019-01-10 NOTE — Assessment & Plan Note (Signed)
Symptoms blood pressure controlled Check lipids No changes to meds No BB due to bradycardia episodes in the past

## 2019-01-10 NOTE — Progress Notes (Signed)
   Subjective:    Patient ID: [name], male    DOB: Jul 16, 1941, 78 y.o.   MRN: [id number]  Patient presents for Surgical Clearance (R knee- cardiology clearance has been completed)   Pt here for medical clerance for Right knee replacement by [facility]. Scheduled for April 7th Dr. [name] No specific concerns today  No chronic respiratory issues, stopped smoking > 40 years ago Medications reviewed in detail He did have anemia and reflux issues after the last surgery and has protonix on hand. Reviewed cardiology note, he has been cleared for knee replacement, needs to hold ASA 1 week before surgery     Review Of Systems:  GEN- denies fatigue, fever, weight loss,weakness, recent illness HEENT- denies eye drainage, change in vision, nasal discharge, CVS- denies chest pain, palpitations RESP- denies SOB, cough, wheeze ABD- denies N/V, change in stools, abd pain GU- denies dysuria, hematuria, dribbling, incontinence MSK- +joint pain, muscle aches, injury Neuro- denies headache, dizziness, syncope, seizure activity       Objective:    BP 138/76   Pulse 66   Temp 97.9 F (36.6 C) (Oral)   Resp 16   Ht 6' (1.829 m)   Wt 196 lb (88.9 kg)   SpO2 98%   BMI 26.58 kg/m  GEN- NAD, alert and oriented x3 HEENT- PERRL, EOMI, non injected sclera, pink conjunctiva, MMM, oropharynx clear Neck- Supple, no thyromegaly, no JVD, no bruit  CVS- RRR, no murmur RESP-CTAB ABD-NABS,soft,NT,ND EXT- No edema Pulses- Radial, DP- 2+        Assessment & Plan:      Problem List Items Addressed This Visit      Unprioritized   Anemia   Relevant Orders   Iron   CAD (coronary artery disease)    Symptoms blood pressure controlled Check lipids No changes to meds No BB due to bradycardia episodes in the past       Relevant Orders   CBC with Differential/Platelet   Comprehensive metabolic panel   Lipid panel   CKD (chronic kidney disease), stage III (HCC)    Solo functioning  kidney, check renal function ensure stable before surgery       GERD (gastroesophageal reflux disease)    Has protonix on hand       H/O right nephrectomy   Mixed hyperlipidemia    Other Visit Diagnoses    Pre-operative clearance    -  Primary   Pt medically cleared for surgery, reviewed cardiology note and recommendations, hold ASA  1 week before surgery. Check preop labs for anemia and CKD   Relevant Orders   Urinalysis, Routine w reflex microscopic      Note: This dictation was prepared with Dragon dictation along with smaller phrase technology. Any transcriptional errors that result from this process are unintentional.

## 2019-01-10 NOTE — Assessment & Plan Note (Signed)
Has protonix on hand

## 2019-01-11 LAB — LIPID PANEL
CHOLESTEROL: 135 mg/dL (ref <200)
HDL: 44 mg/dL (ref >=40)
LDL CHOLESTEROL (CALC): 75 mg/dL
Non-HDL Cholesterol (Calc): 91 mg/dL (calc) (ref <130)
TRIGLYCERIDES: 78 mg/dL (ref <150)
Total CHOL/HDL Ratio: 3.1 (calc) (ref <5.0)

## 2019-01-11 LAB — CBC WITH DIFFERENTIAL/PLATELET
ABSOLUTE MONOCYTES: 734 {cells}/uL (ref 200–950)
BASOS PCT: 0.7 %
Basophils Absolute: 48 cells/uL (ref 0–200)
EOS ABS: 129 {cells}/uL (ref 15–500)
Eosinophils Relative: 1.9 %
HEMATOCRIT: 38.9 % (ref 38.5–50.0)
HEMOGLOBIN: 13 g/dL — AB (ref 13.2–17.1)
Lymphs Abs: 993 cells/uL (ref 850–3900)
MCH: 29.9 pg (ref 27.0–33.0)
MCHC: 33.4 g/dL (ref 32.0–36.0)
MCV: 89.4 fL (ref 80.0–100.0)
MPV: 10.9 fL (ref 7.5–12.5)
Monocytes Relative: 10.8 %
NEUTROS ABS: 4896 {cells}/uL (ref 1500–7800)
Neutrophils Relative %: 72 %
PLATELETS: 200 10*3/uL (ref 140–400)
RBC: 4.35 10*6/uL (ref 4.20–5.80)
RDW: 13.4 % (ref 11.0–15.0)
TOTAL LYMPHOCYTE: 14.6 %
WBC: 6.8 10*3/uL (ref 3.8–10.8)

## 2019-01-11 LAB — COMPREHENSIVE METABOLIC PANEL
AG RATIO: 1.4 (calc) (ref 1.0–2.5)
ALKALINE PHOSPHATASE (APISO): 62 U/L (ref 35–144)
ALT: 12 U/L (ref 9–46)
AST: 19 U/L (ref 10–35)
Albumin: 4.1 g/dL (ref 3.6–5.1)
BILIRUBIN TOTAL: 0.8 mg/dL (ref 0.2–1.2)
BUN/Creatinine Ratio: 11 (calc) (ref 6–22)
BUN: 15 mg/dL (ref 7–25)
CALCIUM: 9.6 mg/dL (ref 8.6–10.3)
CO2: 27 mmol/L (ref 20–32)
Chloride: 103 mmol/L (ref 98–110)
Creat: 1.34 mg/dL — ABNORMAL HIGH (ref 0.70–1.18)
Globulin: 3 g/dL (calc) (ref 1.9–3.7)
Glucose, Bld: 86 mg/dL (ref 65–99)
Potassium: 4.6 mmol/L (ref 3.5–5.3)
Sodium: 141 mmol/L (ref 135–146)
Total Protein: 7.1 g/dL (ref 6.1–8.1)

## 2019-01-11 LAB — IRON: Iron: 65 ug/dL (ref 50–180)

## 2019-01-23 ENCOUNTER — Other Ambulatory Visit: Payer: Self-pay

## 2019-01-23 ENCOUNTER — Ambulatory Visit (INDEPENDENT_AMBULATORY_CARE_PROVIDER_SITE_OTHER): Payer: Medicare HMO | Admitting: Family Medicine

## 2019-01-23 ENCOUNTER — Encounter: Payer: Self-pay | Admitting: Family Medicine

## 2019-01-23 VITALS — BP 132/66 | HR 62 | Temp 98.1°F | Resp 14 | Ht 72.0 in | Wt 198.0 lb

## 2019-01-23 DIAGNOSIS — M1710 Unilateral primary osteoarthritis, unspecified knee: Secondary | ICD-10-CM | POA: Diagnosis not present

## 2019-01-23 DIAGNOSIS — S60428A Blister (nonthermal) of other finger, initial encounter: Secondary | ICD-10-CM

## 2019-01-23 DIAGNOSIS — R030 Elevated blood-pressure reading, without diagnosis of hypertension: Secondary | ICD-10-CM

## 2019-01-23 MED ORDER — HYDROCODONE-ACETAMINOPHEN 5-325 MG PO TABS: 1.0000 | ORAL_TABLET | Freq: Four times a day (QID) | ORAL | 0 refills | Status: DC | PRN

## 2019-01-23 NOTE — Patient Instructions (Addendum)
Check your blood pressure once a day and record  Call me Monday-  Use topical antibiotic ointment twice a day and cover  Pain medication refilled  F/U as previous

## 2019-01-23 NOTE — Progress Notes (Signed)
   Subjective:    Patient ID: [name] [name], male    DOB: 05-13-1941, 78 y.o.   MRN: [id number]  Patient presents for Finger Injury (x4 days- pinched end of R pinky with pliers- blood blister noted that has burst)   Right hand 5th digit was using pliers and cut his finger, had a blood blister come up that drained, mild redness, no pus, pain has now resolved, able to use finger normally  Has been watching BP, has been in a lot of pain with his Knee which he is scheduled for April 7th for surgery. Was using norco sparingly only has 2 pills left in bottle- given by ortho it was prescribed in December,  His BP has been up a few days  On 3/3 BP 190/81 came down to 155/80- note he kept checking it until it came down had 6 different readings, few days later 3/7  154/68  ,came down    Review Of Systems:  GEN- denies fatigue, fever, weight loss,weakness, recent illness HEENT- denies eye drainage, change in vision, nasal discharge, CVS- denies chest pain, palpitations RESP- denies SOB, cough, wheeze ABD- denies N/V, change in stools, abd pain GU- denies dysuria, hematuria, dribbling, incontinence MSK- + joint pain, muscle aches, injury Neuro- denies headache, dizziness, syncope, seizure activity       Objective:    BP 132/66   Pulse 62   Temp 98.1 F (36.7 C) (Oral)   Resp 14   Ht 6' (1.829 m)   Wt 198 lb (89.8 kg)   SpO2 99%   BMI 26.85 kg/m  GEN- NAD, alert and oriented x3 HEENT- PERRL, EOMI, non injected sclera, pink conjunctiva, MMM, oropharynx clear Neck- Supple, no thyromegaly CVS- RRR, no murmur RESP-CTAB Skin- Right hand- 5th digit- palmer side scab with mild erythema dry blood FROM joint, NT        Assessment & Plan:      Problem List Items Addressed This Visit      Unprioritized   OA (osteoarthritis) of knee   Relevant Medications   HYDROcodone-acetaminophen (NORCO) 5-325 MG tablet    Other Visit Diagnoses    Elevated blood pressure reading    -  Primary   Continue to record BP once a day at home under relaxed condition as much as possible, BP in the office looks good, but ensure not spiking before his surgery.   Blister (nonthermal) of other finger, initial encounter       No overt infection, TDAP done 2016, given triple antibiotic ointment use BID cover with bandaid      Note: This dictation was prepared with Dragon dictation along with smaller phrase technology. Any transcriptional errors that result from this process are unintentional.

## 2019-01-29 ENCOUNTER — Encounter (HOSPITAL_COMMUNITY): Payer: Self-pay | Admitting: Student

## 2019-01-29 ENCOUNTER — Telehealth: Payer: Self-pay | Admitting: Family Medicine

## 2019-01-29 NOTE — H&P (Signed)
TOTAL KNEE ADMISSION H&P  Patient is being admitted for right total knee arthroplasty.  Subjective:  Chief Complaint:right knee pain.  HPI: [name] [name], 78 y.o. male, has a history of pain and functional disability in the right knee due to arthritis and has failed non-surgical conservative treatments for greater than 12 weeks to includeNSAID's and/or analgesics, corticosteriod injections and activity modification.  Onset of symptoms was gradual, starting 1 years ago with gradually worsening course since that time. The patient noted no past surgery on the right knee(s).  Patient currently rates pain in the right knee(s) at 10 out of 10 with activity. Patient has worsening of pain with activity and weight bearing and pain that interferes with activities of daily living.  Patient has evidence of advanced bilateral knee osteoarthritis with complete loss of joint space medially tricompartmental osteophytes in in both knees. Radiographically noted difference between either knee. by imaging studies. There is no active infection.  Patient Active Problem List   Diagnosis Date Noted  . Anemia 01/10/2019  . Chest pain 09/12/2018  . Constipation 09/10/2018  . Overweight (BMI 25.0-29.9) 09/06/2018  . S/P left TKA 09/05/2018  . 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 Elevated PSA 09/14/2017  . Primary osteoarthritis of left knee 01/27/2017  . OA (osteoarthritis) of knee 01/26/2017  . Spinal stenosis of lumbar region 12/08/2016  . GERD (gastroesophageal reflux disease) 11/26/2011  . Mixed hyperlipidemia 10/05/2008  . CAD (coronary artery disease) 10/05/2008  . Atrial fibrillation ([place]) 10/05/2008  . CKD (chronic kidney disease), stage III ([place]) 10/05/2008   Past Medical History:  Diagnosis Date  . Arthritis    shoulders and ribs   . Atrial fibrillation ([place])    atrial fib/ LOV [name] PA 04/11/12 EPIC, - CHEST X RAY, EKG 5/13 EPIC  . CAD (coronary artery disease)     s/p NSTEMI 04/08;  [place] 02/2007:  Proximal LAD 75%, mid LAD 99%, proximal RCA 25%.  PCI:  Cypher DES to the proximal and mid LAD.  Last nuclear study 11/2011 (after a trip to the ED with CP): EF 58%, low risk study with small inferior wall infarct at the mid and basal level, no ischemia.  Last echo 01/2010: Mild LVH, EF 55-60%, mild AI, mild MR, severely dilated LA and RA  . Contrast dye induced nephropathy    Hx ARF secondary to contrast nephropathy  . Dyslipidemia   . Epididymitis   . History of blood transfusion   . Myocardial infarction ([place]) 2008  . Pleurisy    2012  . Pneumonia    hx of   . 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 Spinal stenosis   . 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 [name],  skin cancer non melanoma    Past Surgical History:  Procedure Laterality Date  . arm surgery     orif right elbow  . CARDIAC CATHETERIZATION  2008  . coronary stents     . CYSTECTOMY  07/15/08  . CYSTOSCOPY WITH BIOPSY  03/23/2012   Procedure: CYSTOSCOPY WITH BIOPSY;  Surgeon: [name], MD;  Location: [facility];  Service: Urology;  Laterality: Right;   BRUSH BIOPSY RIGHT URETERAL STENT  . CYSTOSCOPY WITH URETEROSCOPY  03/23/2012   Procedure: CYSTOSCOPY WITH URETEROSCOPY;  Surgeon: [name], MD;  Location: [facility];  Service: Urology;  Laterality: Right;    **OR Room #8 requested**  C-ARM   . CYSTOSCOPY/RETROGRADE/URETEROSCOPY  02/15/2012   Procedure: CYSTOSCOPY/RETROGRADE/URETEROSCOPY;  Surgeon: [name], MD;  Location: [facility];  Service: Urology;  Laterality: Right;  C-ARM  . JOINT REPLACEMENT     Dr. [name] 09-05-18   . left pinky finger      tip removed from accident  . right kidney removed      2013  . TOTAL KNEE ARTHROPLASTY Left 09/05/2018   Procedure: LEFT TOTAL KNEE ARTHROPLASTY;  Surgeon: [name], MD;  Location: [facility];  Service: Orthopedics;  Laterality: Left;  70 mins with abductor block  . URETER SURGERY      No current facility-administered medications for this encounter.    Current Outpatient Medications   Medication Sig Dispense Refill Last Dose  . atorvastatin (LIPITOR) 40 MG tablet TAKE 1 TABLET (40 MG TOTAL) BY MOUTH EVERY EVENING. 90 tablet 3 Taking  . calcitRIOL (ROCALTROL) 0.5 MCG capsule Take 0.5 mcg by mouth daily.   Taking  . furosemide (LASIX) 40 MG tablet TAKE 1 TABLET EVERY DAY 90 tablet 3 Taking  . HYDROcodone-acetaminophen (NORCO) 5-325 MG tablet Take 1-2 tablets by mouth every 6 (six) hours as needed for moderate pain. 45 tablet 0   . Inositol Niacinate (NIACIN FLUSH FREE) 500 MG CAPS Take 500 mg by mouth daily.    Taking  . methocarbamol (ROBAXIN) 500 MG tablet Take 1 tablet (500 mg total) by mouth every 6 (six) hours as needed for muscle spasms. 40 tablet 0 Taking  . NITROSTAT 0.4 MG SL tablet Place 1 tablet (0.4 mg total) under the tongue every 5 (five) minutes as needed for chest pain. 25 tablet 4 Taking  . Omega-3 Fatty Acids (FISH OIL) 1200 MG CAPS Take 1,200 mg by mouth daily.    Taking  . polyethylene glycol (MIRALAX / GLYCOLAX) packet Take 17 g by mouth 2 (two) times daily. (Patient not taking: Reported on 01/23/2019) 14 each 0 Not Taking  . vitamin B-12 (CYANOCOBALAMIN) 500 MCG tablet Take 500 mcg by mouth daily.   Taking   Allergies  Allergen Reactions  . Contrast Media [Iodinated Diagnostic Agents]     Contrast-induced nephropathy requiring dialysis in 02/2007.  . Iodine-131 Other (See Comments)    Kidneys shut down  . Other Other (See Comments)    Beta blockers cause bradycardia  . Promethazine Other (See Comments)    Pt became drowsy and mild altered mental status    Social History   Tobacco Use  . Smoking status: Former Smoker    Packs/day: 2.00    Years: 10.00    Pack years: 20.00    Types: Cigarettes, Cigars    Last attempt to quit: 11/15/1981    Years since quitting: 37.2  . Smokeless tobacco: Never Used  Substance Use Topics  . Alcohol use: No    Family History  Problem Relation Age of Onset  . Cancer Mother   . Diabetes type II Mother   .  Hyperlipidemia Mother   . Coronary artery disease Father   . Heart attack Father      ROS Constitutional: Constitutional: no fever, chills, night sweats, or significant weight loss.  Cardiovascular: Cardiovascular: no palpitations or chest pain.  Respiratory: Respiratory: no cough or shortness of breath and No COPD.  Gastrointestinal: Gastrointestinal: no vomiting or nausea.  Musculoskeletal: Musculoskeletal: no swelling in Joints and Joint Pain.  Neurologic: Neurologic: no numbness, tingling, or difficulty with balance. Objective:  Physical Exam Patient is a 78 year old male.  Well nourished and well developed.  General: Alert and oriented x3, cooperative and pleasant, no acute distress.  Head: normocephalic,  atraumatic, neck supple.  Eyes: EOMI.  Respiratory: breath sounds clear in all fields, no wheezing, rales, or rhonchi.  Cardiovascular: Regular rate and rhythm, no murmurs, gallops or rubs.  Abdomen: non-tender to palpation and soft, normoactive bowel sounds.  Musculoskeletal: LEFT KNEE EXAM Inspection: Incision site intact and no signs of infection Neurovascular: Sensation intact to light touch distally and brisk capillary refill Varus valgus stress of his knee reproduces the metal on polyethylene sound that he is hearing. This was discussed as he hears a clicking sensation with walking. Range of Motion: Flexion: Full Extension: Full  Right knee exam: Slight flexion contracture with pain along the medial side of the joint. No effusion. Knee ligaments are stable.  Calves soft and nontender. Motor function intact in LE. Strength 5/5 LE bilaterally.  Neuro: Distal pulses 2+. Sensation to light touch intact in LE. Vital signs in last 24 hours: BP: 124/82 mmHg  Labs:   Estimated body mass index is 26.85 kg/m as calculated from the following:   Height as of 01/23/19: 6' (1.829 m).   Weight as of 01/23/19: 89.8 kg.   Imaging Review Plain radiographs  demonstrate severe degenerative joint disease of the right knee(s). The overall alignment isneutral. The bone quality appears to be adequate for age and reported activity level.      Assessment/Plan:  End stage arthritis, right knee   The patient history, physical examination, clinical judgment of the provider and imaging studies are consistent with end stage degenerative joint disease of the right knee(s) and total knee arthroplasty is deemed medically necessary. The treatment options including medical management, injection therapy arthroscopy and arthroplasty were discussed at length. The risks and benefits of total knee arthroplasty were presented and reviewed. The risks due to aseptic loosening, infection, stiffness, patella tracking problems, thromboembolic complications and other imponderables were discussed. The patient acknowledged the explanation, agreed to proceed with the plan and consent was signed. Patient is being admitted for inpatient treatment for surgery, pain control, PT, OT, prophylactic antibiotics, VTE prophylaxis, progressive ambulation and ADL's and discharge planning. The patient is planning to be discharged home.  Therapy Plans: outpatient PT in [place]  PCP: Alycia Rossetti, MD   D/C Plans: Home with wife   Post-op Meds: No Rx given    Tranexamic Acid: To be given - IV    Decadron: Is to be given   FYI: ASA  Norco   DME: has - 3-n-1 from previous left TKA  Other: Hx of Atrial fibrillation on ASA 81 mg. Iron caused nausea with left TKA. Hx of stage 3 CKD, pt only has one kidney.    Anticipated LOS equal to or greater than 2 midnights due to - Age 54 and older with one or more of the following:  - Obesity  - Expected need for hospital services (PT, OT, Nursing) required for safe  discharge  - Anticipated need for postoperative skilled nursing care or inpatient rehab  - Active co-morbidities: Coronary Artery Disease and Cardiac Arrhythmia OR   -  Unanticipated findings during/Post Surgery: None  - Patient is a high risk of re-admission due to: None   - Patient was instructed on what medications to stop prior to surgery. - Follow-up visit in 2 weeks with Dr. [name] - Begin physical therapy following surgery - Pre-operative lab work as pre-surgical testing - Prescriptions will be provided in hospital at time of discharge  Griffith Citron, PA-C Orthopedic Surgery [facility] [place]

## 2019-01-29 NOTE — Telephone Encounter (Signed)
Bp readings reviewed  range on average 124-140/60-70 HR 50-70  No new medications at this time

## 2019-01-30 NOTE — Telephone Encounter (Signed)
Call placed to patient and patient made aware.  

## 2019-02-16 DIAGNOSIS — Z85828 Personal history of other malignant neoplasm of skin: Secondary | ICD-10-CM | POA: Diagnosis not present

## 2019-02-16 DIAGNOSIS — L820 Inflamed seborrheic keratosis: Secondary | ICD-10-CM | POA: Diagnosis not present

## 2019-02-20 ENCOUNTER — Ambulatory Visit (HOSPITAL_COMMUNITY): Admission: RE | Admit: 2019-02-20 | Payer: Medicare HMO | Source: Home / Self Care | Admitting: Orthopedic Surgery

## 2019-02-20 ENCOUNTER — Encounter (HOSPITAL_COMMUNITY): Admission: RE | Payer: Self-pay | Source: Home / Self Care

## 2019-02-20 SURGERY — ARTHROPLASTY, KNEE, TOTAL
Anesthesia: Spinal | Laterality: Right

## 2019-03-08 DIAGNOSIS — Z85828 Personal history of other malignant neoplasm of skin: Secondary | ICD-10-CM | POA: Diagnosis not present

## 2019-03-08 DIAGNOSIS — L304 Erythema intertrigo: Secondary | ICD-10-CM | POA: Diagnosis not present

## 2019-03-08 DIAGNOSIS — L821 Other seborrheic keratosis: Secondary | ICD-10-CM | POA: Diagnosis not present

## 2019-03-26 NOTE — H&P (Signed)
TOTAL KNEE ADMISSION H&P  Patient is being admitted for right total knee arthroplasty.  Subjective:  Chief Complaint:   Right knee primary OA / pain  HPI: [name] [name], 78 y.o. male, has a history of pain and functional disability in the right knee due to arthritis and has failed non-surgical conservative treatments for greater than 12 weeks to include NSAID's and/or analgesics, use of assistive devices and activity modification.  Onset of symptoms was gradual, starting 2+ years ago with gradually worsening course since that time. The patient noted prior procedures on the knee to include  arthroplasty on the left knee(s).  Patient currently rates pain in the right knee(s) at 10 out of 10 with activity. Patient has worsening of pain with activity and weight bearing, pain that interferes with activities of daily living, pain with passive range of motion, crepitus and joint swelling.  Patient has evidence of periarticular osteophytes and joint space narrowing by imaging studies. There is no active infection.  Risks, benefits and expectations were discussed with the patient.  Risks including but not limited to the risk of anesthesia, blood clots, nerve damage, blood vessel damage, failure of the prosthesis, infection and up to and including death.  Patient understand the risks, benefits and expectations and wishes to proceed with surgery.   PCP: Alycia Rossetti, MD  D/C Plans:       Home   Post-op Meds:       No Rx given   Tranexamic Acid:      To be given - IV   Decadron:      Is to be given  FYI:      ASA  Norco  No IRON (significant GI issues)  DME:   Pt already has equipment   PT:   OPPT Rx given  Pharmacy:    [facility],    Patient Active Problem List   Diagnosis Date Noted  . Anemia 01/10/2019  . Chest pain 09/12/2018  . Constipation 09/10/2018  . Overweight (BMI 25.0-29.9) 09/06/2018  . S/P left TKA 09/05/2018  . 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 Elevated PSA  09/14/2017  . Primary osteoarthritis of left knee 01/27/2017  . OA (osteoarthritis) of knee 01/26/2017  . Spinal stenosis of lumbar region 12/08/2016  . GERD (gastroesophageal reflux disease) 11/26/2011  . Mixed hyperlipidemia 10/05/2008  . CAD (coronary artery disease) 10/05/2008  . Atrial fibrillation ([place]) 10/05/2008  . CKD (chronic kidney disease), stage III ([place]) 10/05/2008   Past Medical History:  Diagnosis Date  . Arthritis    shoulders and ribs   . Atrial fibrillation ([place])    atrial fib/ LOV [name] PA 04/11/12 EPIC, - CHEST X RAY, EKG 5/13 EPIC  . CAD (coronary artery disease)     s/p NSTEMI 04/08;  [place] 02/2007: Proximal LAD 75%, mid LAD 99%, proximal RCA 25%.  PCI:  Cypher DES to the proximal and mid LAD.  Last nuclear study 11/2011 (after a trip to the ED with CP): EF 58%, low risk study with small inferior wall infarct at the mid and basal level, no ischemia.  Last echo 01/2010: Mild LVH, EF 55-60%, mild AI, mild MR, severely dilated LA and RA  . Contrast dye induced nephropathy    Hx ARF secondary to contrast nephropathy  . Dyslipidemia   . Epididymitis   . History of blood transfusion   . Myocardial infarction ([place]) 2008  . Pleurisy    2012  . Pneumonia    hx of   .  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 Spinal stenosis   . 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 [name],  skin cancer non melanoma    Past Surgical History:  Procedure Laterality Date  . arm surgery     orif right elbow  . CARDIAC CATHETERIZATION  2008  . coronary stents     . CYSTECTOMY  07/15/08  . CYSTOSCOPY WITH BIOPSY  03/23/2012   Procedure: CYSTOSCOPY WITH BIOPSY;  Surgeon: [name], MD;  Location: [facility];  Service: Urology;  Laterality: Right;   BRUSH BIOPSY RIGHT URETERAL STENT  . CYSTOSCOPY WITH URETEROSCOPY  03/23/2012   Procedure: CYSTOSCOPY WITH URETEROSCOPY;  Surgeon: [name], MD;  Location: [facility];  Service: Urology;  Laterality: Right;    **OR Room #8 requested**  C-ARM   .  CYSTOSCOPY/RETROGRADE/URETEROSCOPY  02/15/2012   Procedure: CYSTOSCOPY/RETROGRADE/URETEROSCOPY;  Surgeon: [name], MD;  Location: [facility];  Service: Urology;  Laterality: Right;  C-ARM  . JOINT REPLACEMENT     Dr. [name] 09-05-18   . left pinky finger      tip removed from accident  . right kidney removed      2013  . TOTAL KNEE ARTHROPLASTY Left 09/05/2018   Procedure: LEFT TOTAL KNEE ARTHROPLASTY;  Surgeon: [name], MD;  Location: [facility];  Service: Orthopedics;  Laterality: Left;  70 mins with abductor block  . URETER SURGERY      No current facility-administered medications for this encounter.    Current Outpatient Medications  Medication Sig Dispense Refill Last Dose  . atorvastatin (LIPITOR) 40 MG tablet TAKE 1 TABLET (40 MG TOTAL) BY MOUTH EVERY EVENING. 90 tablet 3 Taking  . calcitRIOL (ROCALTROL) 0.5 MCG capsule Take 0.5 mcg by mouth daily.   Taking  . furosemide (LASIX) 40 MG tablet TAKE 1 TABLET EVERY DAY 90 tablet 3 Taking  . HYDROcodone-acetaminophen (NORCO) 5-325 MG tablet Take 1-2 tablets by mouth every 6 (six) hours as needed for moderate pain. 45 tablet 0   . Inositol Niacinate (NIACIN FLUSH FREE) 500 MG CAPS Take 500 mg by mouth daily.    Taking  . methocarbamol (ROBAXIN) 500 MG tablet Take 1 tablet (500 mg total) by mouth every 6 (six) hours as needed for muscle spasms. 40 tablet 0 Taking  . NITROSTAT 0.4 MG SL tablet Place 1 tablet (0.4 mg total) under the tongue every 5 (five) minutes as needed for chest pain. 25 tablet 4 Taking  . Omega-3 Fatty Acids (FISH OIL) 1200 MG CAPS Take 1,200 mg by mouth daily.    Taking  . polyethylene glycol (MIRALAX / GLYCOLAX) packet Take 17 g by mouth 2 (two) times daily. (Patient not taking: Reported on 01/23/2019) 14 each 0 Not Taking  . vitamin B-12 (CYANOCOBALAMIN) 500 MCG tablet Take 500 mcg by mouth daily.   Taking   Allergies  Allergen Reactions  . Contrast Media [Iodinated Diagnostic Agents]     Contrast-induced  nephropathy requiring dialysis in 02/2007.  . Iodine-131 Other (See Comments)    Kidneys shut down  . Other Other (See Comments)    Beta blockers cause bradycardia  . Promethazine Other (See Comments)    Pt became drowsy and mild altered mental status    Social History   Tobacco Use  . Smoking status: Former Smoker    Packs/day: 2.00    Years: 10.00    Pack years: 20.00    Types: Cigarettes, Cigars    Last attempt to quit: 11/15/1981  Years since quitting: 37.3  . Smokeless tobacco: Never Used  Substance Use Topics  . Alcohol use: No    Family History  Problem Relation Age of Onset  . Cancer Mother   . Diabetes type II Mother   . Hyperlipidemia Mother   . Coronary artery disease Father   . Heart attack Father      Review of Systems  Constitutional: Negative.   HENT: Negative.   Eyes: Negative.   Respiratory: Negative.   Cardiovascular: Negative.        Hx of a-fib  Gastrointestinal: Positive for heartburn.  Genitourinary: Negative.   Musculoskeletal: Positive for joint pain.  Skin: Negative.   Neurological: Negative.   Endo/Heme/Allergies: Negative.   Psychiatric/Behavioral: Negative.     Objective:  Physical Exam  Constitutional: He is oriented to person, place, and time. He appears well-developed.  HENT:  Head: Normocephalic.  Eyes: Pupils are equal, round, and reactive to light.  Neck: Neck supple. No JVD present. No tracheal deviation present. No thyromegaly present.  Cardiovascular: Normal rate, regular rhythm and intact distal pulses.  Respiratory: Effort normal and breath sounds normal. No respiratory distress. He has no wheezes.  GI: Soft. There is no abdominal tenderness. There is no guarding.  Musculoskeletal:     Right knee: He exhibits decreased range of motion, swelling and bony tenderness. He exhibits no ecchymosis, no deformity, no laceration and no erythema. Tenderness found.  Lymphadenopathy:    He has no cervical adenopathy.   Neurological: He is alert and oriented to person, place, and time. A sensory deficit (occasional LE numbness) is present.  Skin: Skin is warm and dry.  Psychiatric: He has a normal mood and affect.     Labs:  Estimated body mass index is 26.85 kg/m as calculated from the following:   Height as of 01/23/19: 6' (1.829 m).   Weight as of 01/23/19: 89.8 kg.   Imaging Review Plain radiographs demonstrate severe degenerative joint disease of the right knee.  The bone quality appears to be good for age and reported activity level.    Assessment/Plan:  End stage arthritis, right knee   The patient history, physical examination, clinical judgment of the provider and imaging studies are consistent with end stage degenerative joint disease of the right knee(s) and total knee arthroplasty is deemed medically necessary. The treatment options including medical management, injection therapy arthroscopy and arthroplasty were discussed at length. The risks and benefits of total knee arthroplasty were presented and reviewed. The risks due to aseptic loosening, infection, stiffness, patella tracking problems, thromboembolic complications and other imponderables were discussed. The patient acknowledged the explanation, agreed to proceed with the plan and consent was signed. Patient is being admitted for inpatient treatment for surgery, pain control, PT, OT, prophylactic antibiotics, VTE prophylaxis, progressive ambulation and ADL's and discharge planning. The patient is planning to be discharged home.     Patient's anticipated LOS is less than 2 midnights, meeting these requirements: - Lives within 1 hour of care - Has a competent adult at home to recover with post-op recover - NO history of  - Chronic pain requiring opiods  - Diabetes  - Heart failure  - Heart attack  - Stroke  - DVT/VTE  - Respiratory Failure/COPD  - Anemia  - Advanced Liver disease

## 2019-03-29 NOTE — Patient Instructions (Signed)
[facility]    Your procedure is scheduled on: 04/03/19   Report to [facility] Main  Entrance  Report to admitting at 8:00 AM   YOU NEED TO HAVE A COVID 19 TEST ON_5/15______, THIS TEST MUST BE DONE BEFORE SURGERY, COME TO [facility] ENTRANCE BETWEEN THE HOURS OF 900 AM AND 300 PM ON YOUR COVID TEST DATE.   Call this number if you have problems the morning of surgery [phone]    Remember: Do not eat food after Midnight.  BRUSH YOUR TEETH MORNING OF SURGERY AND RINSE YOUR MOUTH OUT, NO CHEWING GUM CANDY OR MINTS.  Do not eat food After Midnight.   YOU MAY HAVE CLEAR LIQUIDS FROM MIDNIGHT UNTIL 4:30AM.   At 4:30AM Please finish the prescribed Pre-Surgery Gatorade drink. Nothing by mouth after you finish the Gatorade drink !    CLEAR LIQUID DIET   Foods Allowed                                                                     Foods Excluded  Coffee and tea, regular and decaf                             liquids that you cannot  Plain Jell-O in any flavor                                             see through such as: Fruit ices (not with fruit pulp)                                     milk, soups, orange juice  Iced Popsicles                                    All solid food Carbonated beverages, regular and diet                                    Cranberry, grape and apple juices Sports drinks like Gatorade Lightly seasoned clear broth or consume(fat free) Sugar, honey syrup    Take these medicines the morning of surgery with A SIP OF WATER: none                                 You may not have any metal on your body including             piercings  Do not wear jewelry, , lotions, powders or , deodorant .              Men may shave face and neck.   Do not bring valuables to the hospital. [facility] IS NOT  RESPONSIBLE   FOR VALUABLES.  Contacts, dentures or bridgework may not be worn into  surgery.      [facility] - Preparing for Surgery Before surgery, you can play an important role.  Because skin is not sterile, your skin needs to be as free of germs as possible.  You can reduce the number of germs on your skin by washing with CHG (chlorahexidine gluconate) soap before surgery.  CHG is an antiseptic cleaner which kills germs and bonds with the skin to continue killing germs even after washing. Please DO NOT use if you have an allergy to CHG or antibacterial soaps.  If your skin becomes reddened/irritated stop using the CHG and inform your nurse when you arrive at Short Stay. Do not shave (including legs and underarms) for at least 48 hours prior to the first CHG shower.  You may shave your face/neck. Please follow these instructions carefully:  1.  Shower with CHG Soap the night before surgery and the  morning of Surgery.  2.  If you choose to wash your hair, wash your hair first as usual with your  normal  shampoo.  3.  After you shampoo, rinse your hair and body thoroughly to remove the  shampoo.                           4.  Use CHG as you would any other liquid soap.  You can apply chg directly  to the skin and wash                       Gently with a scrungie or clean washcloth.  5.  Apply the CHG Soap to your body ONLY FROM THE NECK DOWN.   Do not use on face/ open                           Wound or open sores. Avoid contact with eyes, ears mouth and genitals (private parts).                       Wash face,  Genitals (private parts) with your normal soap.             6.  Wash thoroughly, paying special attention to the area where your surgery  will be performed.  7.  Thoroughly rinse your body with warm water from the neck down.  8.  DO NOT shower/wash with your normal soap after using and rinsing off  the CHG Soap.                9.  Pat yourself dry with a clean towel.            10.  Wear clean pajamas.            11.  Place clean sheets on your bed the night of  your first shower and do not  sleep with pets. Day of Surgery : Do not apply any lotions/deodorants the morning of surgery.  Please wear clean clothes to the hospital/surgery center.  FAILURE TO FOLLOW THESE INSTRUCTIONS MAY RESULT IN THE CANCELLATION OF YOUR SURGERY PATIENT SIGNATURE_________________________________  NURSE SIGNATURE__________________________________  ________________________________________________________________________  Patient Signature:  Date:   Nurse Signature:  Date:   Reviewed and Endorsed by [place] Patient Education Committee, August 2015 WHAT IS A BLOOD TRANSFUSION? Blood Transfusion Information  A transfusion is  the replacement of blood or some of its parts. Blood is made up of multiple cells which provide different functions.  Red blood cells carry oxygen and are used for blood loss replacement.  White blood cells fight against infection.  Platelets control bleeding.  Plasma helps clot blood.  Other blood products are available for specialized needs, such as hemophilia or other clotting disorders. BEFORE THE TRANSFUSION  Who gives blood for transfusions?   Healthy volunteers who are fully evaluated to make sure their blood is safe. This is blood bank blood. Transfusion therapy is the safest it has ever been in the practice of medicine. Before blood is taken from a donor, a complete history is taken to make sure that person has no history of diseases nor engages in risky social behavior (examples are intravenous drug use or sexual activity with multiple partners). The donor's travel history is screened to minimize risk of transmitting infections, such as malaria. The donated blood is tested for signs of infectious diseases, such as HIV and hepatitis. The blood is then tested to be sure it is compatible with you in order to minimize the chance of a transfusion reaction. If you or a relative donates blood, this is often done in anticipation of surgery  and is not appropriate for emergency situations. It takes many days to process the donated blood. RISKS AND COMPLICATIONS Although transfusion therapy is very safe and saves many lives, the main dangers of transfusion include:   Getting an infectious disease.  Developing a transfusion reaction. This is an allergic reaction to something in the blood you were given. Every precaution is taken to prevent this. The decision to have a blood transfusion has been considered carefully by your caregiver before blood is given. Blood is not given unless the benefits outweigh the risks. AFTER THE TRANSFUSION  Right after receiving a blood transfusion, you will usually feel much better and more energetic. This is especially true if your red blood cells have gotten low (anemic). The transfusion raises the level of the red blood cells which carry oxygen, and this usually causes an energy increase.  The nurse administering the transfusion will monitor you carefully for complications. HOME CARE INSTRUCTIONS  No special instructions are needed after a transfusion. You may find your energy is better. Speak with your caregiver about any limitations on activity for underlying diseases you may have. SEEK MEDICAL CARE IF:   Your condition is not improving after your transfusion.  You develop redness or irritation at the intravenous (IV) site. SEEK IMMEDIATE MEDICAL CARE IF:  Any of the following symptoms occur over the next 12 hours:  Shaking chills.  You have a temperature by mouth above 102 F (38.9 C), not controlled by medicine.  Chest, back, or muscle pain.  People around you feel you are not acting correctly or are confused.  Shortness of breath or difficulty breathing.  Dizziness and fainting.  You get a rash or develop hives.  You have a decrease in urine output.  Your urine turns a dark color or changes to pink, red, or brown. Any of the following symptoms occur over the next 10  days:  You have a temperature by mouth above 102 F (38.9 C), not controlled by medicine.  Shortness of breath.  Weakness after normal activity.  The white part of the eye turns yellow (jaundice).  You have a decrease in the amount of urine or are urinating less often.  Your urine turns a dark color or changes  to pink, red, or brown. Document Released: 10/29/2000 Document Revised: 01/24/2012 Document Reviewed: 06/17/2008 ExitCare Patient Information 2014 [place].  _______________________________________________________________________  Incentive Spirometer  An incentive spirometer is a tool that can help keep your lungs clear and active. This tool measures how well you are filling your lungs with each breath. Taking long deep breaths may help reverse or decrease the chance of developing breathing (pulmonary) problems (especially infection) following:  A long period of time when you are unable to move or be active. BEFORE THE PROCEDURE   If the spirometer includes an indicator to show your best effort, your nurse or respiratory therapist will set it to a desired goal.  If possible, sit up straight or lean slightly forward. Try not to slouch.  Hold the incentive spirometer in an upright position. INSTRUCTIONS FOR USE  1. Sit on the edge of your bed if possible, or sit up as far as you can in bed or on a chair. 2. Hold the incentive spirometer in an upright position. 3. Breathe out normally. 4. Place the mouthpiece in your mouth and seal your lips tightly around it. 5. Breathe in slowly and as deeply as possible, raising the piston or the ball toward the top of the column. 6. Hold your breath for 3-5 seconds or for as long as possible. Allow the piston or ball to fall to the bottom of the column. 7. Remove the mouthpiece from your mouth and breathe out normally. 8. Rest for a few seconds and repeat Steps 1 through 7 at least 10 times every 1-2 hours when you are awake.  Take your time and take a few normal breaths between deep breaths. 9. The spirometer may include an indicator to show your best effort. Use the indicator as a goal to work toward during each repetition. 10. After each set of 10 deep breaths, practice coughing to be sure your lungs are clear. If you have an incision (the cut made at the time of surgery), support your incision when coughing by placing a pillow or rolled up towels firmly against it. Once you are able to get out of bed, walk around indoors and cough well. You may stop using the incentive spirometer when instructed by your caregiver.  RISKS AND COMPLICATIONS  Take your time so you do not get dizzy or light-headed.  If you are in pain, you may need to take or ask for pain medication before doing incentive spirometry. It is harder to take a deep breath if you are having pain. AFTER USE  Rest and breathe slowly and easily.  It can be helpful to keep track of a log of your progress. Your caregiver can provide you with a simple table to help with this. If you are using the spirometer at home, follow these instructions: [place] IF:   You are having difficultly using the spirometer.  You have trouble using the spirometer as often as instructed.  Your pain medication is not giving enough relief while using the spirometer.  You develop fever of 100.5 F (38.1 C) or higher. SEEK IMMEDIATE MEDICAL CARE IF:   You cough up bloody sputum that had not been present before.  You develop fever of 102 F (38.9 C) or greater.  You develop worsening pain at or near the incision site. MAKE SURE YOU:   Understand these instructions.  Will watch your condition.  Will get help right away if you are not doing well or get worse. Document Released: 03/14/2007  Document Revised: 01/24/2012 Document Reviewed: 05/15/2007 [facility] Patient Information 2014 [place],  [place].   ________________________________________________________________________

## 2019-03-30 ENCOUNTER — Other Ambulatory Visit (HOSPITAL_COMMUNITY)
Admission: RE | Admit: 2019-03-30 | Discharge: 2019-03-30 | Disposition: A | Discharge status: Home / Self Care | Payer: Medicare HMO | Source: Ambulatory Visit | Attending: Orthopedic Surgery | Admitting: Orthopedic Surgery

## 2019-03-30 ENCOUNTER — Other Ambulatory Visit: Payer: Self-pay

## 2019-03-30 ENCOUNTER — Encounter (HOSPITAL_COMMUNITY)
Admission: RE | Admit: 2019-03-30 | Discharge: 2019-03-30 | Disposition: A | Discharge status: Home / Self Care | Payer: Medicare HMO | Source: Ambulatory Visit | Attending: Orthopedic Surgery | Admitting: Orthopedic Surgery

## 2019-03-30 ENCOUNTER — Encounter (HOSPITAL_COMMUNITY): Payer: Self-pay

## 2019-03-30 DIAGNOSIS — Z1159 Encounter for screening for other viral diseases: Secondary | ICD-10-CM | POA: Insufficient documentation

## 2019-03-30 DIAGNOSIS — R9431 Abnormal electrocardiogram [ECG] [EKG]: Secondary | ICD-10-CM | POA: Insufficient documentation

## 2019-03-30 DIAGNOSIS — Z01818 Encounter for other preprocedural examination: Secondary | ICD-10-CM | POA: Insufficient documentation

## 2019-03-30 DIAGNOSIS — M1711 Unilateral primary osteoarthritis, right knee: Secondary | ICD-10-CM | POA: Insufficient documentation

## 2019-03-30 LAB — BASIC METABOLIC PANEL
Anion gap: 8 (ref 5–15)
BUN: 18 mg/dL (ref 8–23)
CO2: 27 mmol/L (ref 22–32)
Calcium: 8.9 mg/dL (ref 8.9–10.3)
Chloride: 106 mmol/L (ref 98–111)
Creatinine, Ser: 1.12 mg/dL (ref 0.61–1.24)
GFR calc Af Amer: >60 mL/min (ref >60)
GFR calc non Af Amer: >60 mL/min (ref >60)
Glucose, Bld: 103 mg/dL — ABNORMAL HIGH (ref 70–99)
Potassium: 4.1 mmol/L (ref 3.5–5.1)
Sodium: 141 mmol/L (ref 135–145)

## 2019-03-30 LAB — CBC
HCT: 39.8 % (ref 39.0–52.0)
Hemoglobin: 12.9 g/dL — ABNORMAL LOW (ref 13.0–17.0)
MCH: 31.1 pg (ref 26.0–34.0)
MCHC: 32.4 g/dL (ref 30.0–36.0)
MCV: 95.9 fL (ref 80.0–100.0)
Platelets: 166 10*3/uL (ref 150–400)
RBC: 4.15 MIL/uL — ABNORMAL LOW (ref 4.22–5.81)
RDW: 13.5 % (ref 11.5–15.5)
WBC: 6 10*3/uL (ref 4.0–10.5)
nRBC: 0 % (ref 0.0–0.2)

## 2019-03-30 LAB — SURGICAL PCR SCREEN
MRSA, PCR: NEGATIVE
Staphylococcus aureus: NEGATIVE

## 2019-03-30 NOTE — Progress Notes (Signed)
PCP Dr. :Kevan Rosebush CARDIOLOGIST:Dr Bahgat Bhavinkumar  INFO IN Epic:EKG,LABS  Cardiac clearance  INFO ON CHART:  BLOOD THINNERS AND LAST DOSES:ASA stopped 5/12 per Dr. [name] ____________________________________  PATIENT SYMPTOMS AT TIME OF PREOP:no

## 2019-03-31 LAB — NOVEL CORONAVIRUS, NAA (HOSP ORDER, SEND-OUT TO REF LAB; TAT 18-24 HRS): SARS-CoV-2, NAA: NOT DETECTED

## 2019-04-02 NOTE — Progress Notes (Signed)
Anesthesia Chart Review   Case:  [id number] Date/Time:  04/03/19 0950   Procedure:  TOTAL KNEE ARTHROPLASTY (Right ) - 70 mins   Anesthesia type:  Spinal   Pre-op diagnosis:  Right knee osteoarthritis   Location:  [facility] ROOM 09 / [facility]   Surgeon:  [name], MD      DISCUSSION:77 yo former smoker (20 pack years, quit 11/15/81) with h/o A-fib, dyslipidemia, CKD Stage II (solitary kidney), GERD, CAD (NTEMI 2008, DES to proximal and mid LAD), right knee OA scheduled for above procedure 04/03/19 with Dr. [name].   Pt declines anticoagulant for A-fib.  Stopped Xarelto 06/2018 due to frequent bruising and cost.  He is not taking ASA 81mg .   Pt seen by PCP, Dr. Vic Blackbird, 01/10/2019.  Per her note, "Pt medically cleared for surgery, reviewed cardiology note and recommendations, hold ASA 1 week before surgery.  Check preop labs for anemia and CKD."  Pt cleared by cardiology as well.  Per Lyda Jester, PA-C 01/04/2019, "[name] was last seen on 08/18/18 by Gilberto Better, PA-C, for clearance for left TKR. He is now needing right TKR. He was cleared in October and Dr. Copper cleared him to hold ASA 7 days prior to surgery.  Since that day, Laddie Math has continued to do well from a cardiac standpoint. He denies anginal symptomatology. No exertional CP or dyspnea.  Therefore, based on ACC/AHA guidelines, the patient would be at acceptable risk for the planned procedure without further cardiovascular testing. Ok to hold ASA 7 days prior to surgery."  S/p left knee arthroplasty 09/05/18 with no anesthesia complications noted.    Pt can proceed with planned procedure barring acute status change.  VS: BP (!) 170/78   Pulse 63   Temp 36.8 C (Oral)   Resp 16   Ht 6' (1.829 m)   Wt 91.1 kg   SpO2 100%   BMI 27.23 kg/m   PROVIDERS: Graham, [name], MD is PCP   Berenice Bouton, MD is Cardiologist  LABS: Labs reviewed: Acceptable for surgery. (all labs ordered are listed, but only  abnormal results are displayed)  Labs Reviewed  CBC - Abnormal; Notable for the following components:      Result Value   RBC 4.15 (*)    Hemoglobin 12.9 (*)    All other components within normal limits  BASIC METABOLIC PANEL - Abnormal; Notable for the following components:   Glucose, Bld 103 (*)    All other components within normal limits  SURGICAL PCR SCREEN  TYPE AND SCREEN     IMAGES:   EKG: 03/30/2019 Rate 51 bpm Atrial fibrillation with slow ventricular response Anterior infarct, age undetermined No significant change since last tracing   CV: Lexiscan Myoview 12/01/2011 Low risk nuclear scan. Continue current medical therapy.  Echo 02/11/2010 Study Conclusions  - Left ventricle: Wall thickness was increased in a pattern of mild  LVH. Systolic function was normal. The estimated ejection fraction  was in the range of 55% to 60%. Wall motion was normal; there were  no regional wall motion abnormalities. - Aortic valve: Mild regurgitation. - Mitral valve: Mild regurgitation. - Left atrium: The atrium was severely dilated. - Right atrium: The atrium was severely dilated. Past Medical History:  Diagnosis Date  . Arthritis    shoulders and ribs   . Atrial fibrillation ([place])    atrial fib/ LOV [name] PA 04/11/12 EPIC, - CHEST X RAY, EKG 5/13 EPIC  . CAD (coronary  artery disease)     s/p NSTEMI 04/08;  [place] 02/2007: Proximal LAD 75%, mid LAD 99%, proximal RCA 25%.  PCI:  Cypher DES to the proximal and mid LAD.  Last nuclear study 11/2011 (after a trip to the ED with CP): EF 58%, low risk study with small inferior wall infarct at the mid and basal level, no ischemia.  Last echo 01/2010: Mild LVH, EF 55-60%, mild AI, mild MR, severely dilated LA and RA  . Contrast dye induced nephropathy    Hx ARF secondary to contrast nephropathy  . Dyslipidemia   . Epididymitis   . History of blood transfusion   . Myocardial infarction ([place]) 2008  . Pleurisy    2012  .  Pneumonia    hx of   . 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 Spinal stenosis   . 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 [name],  skin cancer non melanoma    Past Surgical History:  Procedure Laterality Date  . arm surgery     orif right elbow  . CARDIAC CATHETERIZATION  2008  . coronary stents     . CYSTECTOMY  07/15/08  . CYSTOSCOPY WITH BIOPSY  03/23/2012   Procedure: CYSTOSCOPY WITH BIOPSY;  Surgeon: [name], MD;  Location: [facility];  Service: Urology;  Laterality: Right;   BRUSH BIOPSY RIGHT URETERAL STENT  . CYSTOSCOPY WITH URETEROSCOPY  03/23/2012   Procedure: CYSTOSCOPY WITH URETEROSCOPY;  Surgeon: [name], MD;  [facility];  Service: Urology;  Laterality: Right;    **OR Room #8 requested**  C-ARM   . CYSTOSCOPY/RETROGRADE/URETEROSCOPY  02/15/2012   Procedure: CYSTOSCOPY/RETROGRADE/URETEROSCOPY;  Surgeon: [name], MD;  Location: [facility];  Service: Urology;  Laterality: Right;  C-ARM  . JOINT REPLACEMENT     Dr. [name] 09-05-18   . left pinky finger      tip removed from accident  . right kidney removed      2013  . TOTAL KNEE ARTHROPLASTY Left 09/05/2018   Procedure: LEFT TOTAL KNEE ARTHROPLASTY;  Surgeon: [name], MD;  Location[facility];  Service: Orthopedics;  Laterality: Left;  70 mins with abductor block  . URETER SURGERY      MEDICATIONS: . aspirin EC 81 MG tablet  . atorvastatin (LIPITOR) 40 MG tablet  . calcitRIOL (ROCALTROL) 0.5 MCG capsule  . furosemide (LASIX) 40 MG tablet  . HYDROcodone-acetaminophen (NORCO) 5-325 MG tablet  . Inositol Niacinate (NIACIN FLUSH FREE) 500 MG CAPS  . methocarbamol (ROBAXIN) 500 MG tablet  . NITROSTAT 0.4 MG SL tablet  . Omega-3 Fatty Acids (FISH OIL) 1200 MG CAPS  . polyethylene glycol (MIRALAX / GLYCOLAX) packet  . vitamin B-12 (CYANOCOBALAMIN) 500 MCG tablet   No current facility-administered medications for this encounter.    Maia Plan [facility] Pre-Surgical Testing [phone] 04/02/19 9:38 AM

## 2019-04-02 NOTE — Anesthesia Preprocedure Evaluation (Addendum)
Anesthesia Evaluation  Patient identified by MRN, date of birth, ID band Patient awake    Reviewed: Allergy & Precautions, NPO status , Patient's Chart, lab work & pertinent test results  Airway Mallampati: II  TM Distance: >3 FB Neck ROM: Full    Dental no notable dental hx.    Pulmonary neg pulmonary ROS, former smoker,    Pulmonary exam normal breath sounds clear to auscultation       Cardiovascular + CAD and + Past MI  + dysrhythmias Atrial Fibrillation + Valvular Problems/Murmurs AI  Rhythm:Irregular Rate:Normal + Systolic murmurs    Neuro/Psych negative neurological ROS  negative psych ROS   GI/Hepatic negative GI ROS, Neg liver ROS,   Endo/Other  negative endocrine ROS  Renal/GU negative Renal ROS  negative genitourinary   Musculoskeletal negative musculoskeletal ROS (+)   Abdominal   Peds negative pediatric ROS (+)  Hematology negative hematology ROS (+)   Anesthesia Other Findings   Reproductive/Obstetrics negative OB ROS                            Anesthesia Physical Anesthesia Plan  ASA: III  Anesthesia Plan: Spinal   Post-op Pain Management:  Regional for Post-op pain   Induction: Intravenous  PONV Risk Score and Plan:   Airway Management Planned: Simple Face Mask  Additional Equipment:   Intra-op Plan:   Post-operative Plan:   Informed Consent: I have reviewed the patients History and Physical, chart, labs and discussed the procedure including the risks, benefits and alternatives for the proposed anesthesia with the patient or authorized representative who has indicated his/her understanding and acceptance.     Dental advisory given  Plan Discussed with: CRNA and Surgeon  Anesthesia Plan Comments: (See PAT note 03/30/2019, Konrad Felix, PA-C)       Anesthesia Quick Evaluation

## 2019-04-03 ENCOUNTER — Observation Stay (HOSPITAL_COMMUNITY)
Admission: RE | Admit: 2019-04-03 | Discharge: 2019-04-04 | Disposition: A | Discharge status: Home / Self Care | Payer: Medicare HMO | Attending: Orthopedic Surgery | Admitting: Orthopedic Surgery

## 2019-04-03 ENCOUNTER — Ambulatory Visit (HOSPITAL_COMMUNITY): Payer: Medicare HMO | Admitting: Anesthesiology

## 2019-04-03 ENCOUNTER — Encounter (HOSPITAL_COMMUNITY)
Admission: RE | Disposition: A | Discharge status: Home / Self Care | Payer: Self-pay | Source: Home / Self Care | Attending: Orthopedic Surgery

## 2019-04-03 ENCOUNTER — Ambulatory Visit (HOSPITAL_COMMUNITY): Payer: Medicare HMO | Admitting: Physician Assistant

## 2019-04-03 ENCOUNTER — Encounter (HOSPITAL_COMMUNITY): Payer: Self-pay | Admitting: *Deleted

## 2019-04-03 ENCOUNTER — Other Ambulatory Visit: Payer: Self-pay

## 2019-04-03 DIAGNOSIS — I4891 Unspecified atrial fibrillation: Secondary | ICD-10-CM | POA: Insufficient documentation

## 2019-04-03 DIAGNOSIS — M170 Bilateral primary osteoarthritis of knee: Secondary | ICD-10-CM | POA: Diagnosis not present

## 2019-04-03 DIAGNOSIS — Z79899 Other long term (current) drug therapy: Secondary | ICD-10-CM | POA: Insufficient documentation

## 2019-04-03 DIAGNOSIS — I2510 Atherosclerotic heart disease of native coronary artery without angina pectoris: Secondary | ICD-10-CM | POA: Diagnosis not present

## 2019-04-03 DIAGNOSIS — Z87891 Personal history of nicotine dependence: Secondary | ICD-10-CM | POA: Insufficient documentation

## 2019-04-03 DIAGNOSIS — Z96659 Presence of unspecified artificial knee joint: Secondary | ICD-10-CM

## 2019-04-03 DIAGNOSIS — E1122 Type 2 diabetes mellitus with diabetic chronic kidney disease: Secondary | ICD-10-CM | POA: Insufficient documentation

## 2019-04-03 DIAGNOSIS — Z955 Presence of coronary angioplasty implant and graft: Secondary | ICD-10-CM | POA: Diagnosis not present

## 2019-04-03 DIAGNOSIS — G8918 Other acute postprocedural pain: Secondary | ICD-10-CM | POA: Diagnosis not present

## 2019-04-03 DIAGNOSIS — Z8551 Personal history of malignant neoplasm of bladder: Secondary | ICD-10-CM | POA: Diagnosis not present

## 2019-04-03 DIAGNOSIS — Z888 Allergy status to other drugs, medicaments and biological substances status: Secondary | ICD-10-CM | POA: Insufficient documentation

## 2019-04-03 DIAGNOSIS — N183 Chronic kidney disease, stage 3 (moderate): Secondary | ICD-10-CM | POA: Diagnosis not present

## 2019-04-03 DIAGNOSIS — M1711 Unilateral primary osteoarthritis, right knee: Principal | ICD-10-CM | POA: Insufficient documentation

## 2019-04-03 DIAGNOSIS — Z96652 Presence of left artificial knee joint: Secondary | ICD-10-CM | POA: Diagnosis not present

## 2019-04-03 DIAGNOSIS — I252 Old myocardial infarction: Secondary | ICD-10-CM | POA: Insufficient documentation

## 2019-04-03 HISTORY — DX: Cardiac arrhythmia, unspecified: I49.9

## 2019-04-03 HISTORY — PX: TOTAL KNEE ARTHROPLASTY: SHX125

## 2019-04-03 LAB — TYPE AND SCREEN
ABO/RH(D): B POS
Antibody Screen: NEGATIVE

## 2019-04-03 SURGERY — ARTHROPLASTY, KNEE, TOTAL
Anesthesia: Spinal | Site: Knee | Laterality: Right

## 2019-04-03 MED ORDER — BUPIVACAINE-EPINEPHRINE (PF) 0.25% -1:200000 IJ SOLN
INTRAMUSCULAR | Status: AC
  Filled 2019-04-03: qty 30

## 2019-04-03 MED ORDER — MORPHINE SULFATE (PF) 2 MG/ML IV SOLN: 0.5000 mg | INTRAVENOUS | Status: DC | PRN

## 2019-04-03 MED ORDER — CEFAZOLIN SODIUM-DEXTROSE 2-4 GM/100ML-% IV SOLN
2.0000 g | INTRAVENOUS | Status: AC
  Administered 2019-04-03: 2 g via INTRAVENOUS
  Filled 2019-04-03: qty 100

## 2019-04-03 MED ORDER — HYDROMORPHONE HCL 1 MG/ML IJ SOLN: 0.2500 mg | INTRAMUSCULAR | Status: DC | PRN

## 2019-04-03 MED ORDER — ACETAMINOPHEN 325 MG PO TABS
325.0000 mg | ORAL_TABLET | Freq: Four times a day (QID) | ORAL | Status: DC | PRN
  Administered 2019-04-03: 325 mg via ORAL
  Filled 2019-04-03: qty 1

## 2019-04-03 MED ORDER — DEXAMETHASONE SODIUM PHOSPHATE 10 MG/ML IJ SOLN
INTRAMUSCULAR | Status: AC
  Filled 2019-04-03: qty 2

## 2019-04-03 MED ORDER — ALUM & MAG HYDROXIDE-SIMETH 200-200-20 MG/5ML PO SUSP: 15.0000 mL | ORAL | Status: DC | PRN

## 2019-04-03 MED ORDER — ONDANSETRON HCL 4 MG/2ML IJ SOLN
INTRAMUSCULAR | Status: AC
  Filled 2019-04-03: qty 4

## 2019-04-03 MED ORDER — HYDROCODONE-ACETAMINOPHEN 5-325 MG PO TABS
1.0000 | ORAL_TABLET | ORAL | Status: DC | PRN
  Administered 2019-04-03: 22:00:00 2 via ORAL
  Administered 2019-04-03: 17:00:00 1 via ORAL
  Administered 2019-04-04: 06:00:00 2 via ORAL
  Filled 2019-04-03: qty 2
  Filled 2019-04-03: qty 1
  Filled 2019-04-03: qty 2

## 2019-04-03 MED ORDER — LACTATED RINGERS IV SOLN
INTRAVENOUS | Status: DC
  Administered 2019-04-03 (×2): via INTRAVENOUS

## 2019-04-03 MED ORDER — TRANEXAMIC ACID-NACL 1000-0.7 MG/100ML-% IV SOLN
1000.0000 mg | Freq: Once | INTRAVENOUS | Status: AC
  Administered 2019-04-03: 15:00:00 1000 mg via INTRAVENOUS
  Filled 2019-04-03: qty 100

## 2019-04-03 MED ORDER — ATORVASTATIN CALCIUM 40 MG PO TABS
40.0000 mg | ORAL_TABLET | Freq: Every day | ORAL | Status: DC
  Administered 2019-04-03: 17:00:00 40 mg via ORAL
  Filled 2019-04-03: qty 1

## 2019-04-03 MED ORDER — FENTANYL CITRATE (PF) 100 MCG/2ML IJ SOLN
100.0000 ug | INTRAMUSCULAR | Status: DC
  Administered 2019-04-03: 10:00:00 50 ug via INTRAVENOUS
  Filled 2019-04-03: qty 2

## 2019-04-03 MED ORDER — SODIUM CHLORIDE 0.9 % IR SOLN
Status: DC | PRN
  Administered 2019-04-03: 1000 mL

## 2019-04-03 MED ORDER — POLYETHYLENE GLYCOL 3350 17 G PO PACK
17.0000 g | PACK | Freq: Two times a day (BID) | ORAL | Status: DC
  Administered 2019-04-03 – 2019-04-04 (×2): 17 g via ORAL
  Filled 2019-04-03: qty 1

## 2019-04-03 MED ORDER — MIDAZOLAM HCL 2 MG/2ML IJ SOLN
2.0000 mg | INTRAMUSCULAR | Status: DC
  Administered 2019-04-03: 10:00:00 1 mg via INTRAVENOUS
  Filled 2019-04-03: qty 2

## 2019-04-03 MED ORDER — PROMETHAZINE HCL 25 MG/ML IJ SOLN: 6.2500 mg | INTRAMUSCULAR | Status: DC | PRN

## 2019-04-03 MED ORDER — DEXAMETHASONE SODIUM PHOSPHATE 10 MG/ML IJ SOLN
10.0000 mg | Freq: Once | INTRAMUSCULAR | Status: AC
  Administered 2019-04-04: 10:00:00 10 mg via INTRAVENOUS
  Filled 2019-04-03: qty 1

## 2019-04-03 MED ORDER — EPHEDRINE 5 MG/ML INJ
INTRAVENOUS | Status: AC
  Filled 2019-04-03: qty 10

## 2019-04-03 MED ORDER — HYDROCODONE-ACETAMINOPHEN 7.5-325 MG PO TABS: 1.0000 | ORAL_TABLET | ORAL | Status: DC | PRN

## 2019-04-03 MED ORDER — CEFAZOLIN SODIUM-DEXTROSE 2-4 GM/100ML-% IV SOLN
2.0000 g | Freq: Four times a day (QID) | INTRAVENOUS | Status: AC
  Administered 2019-04-03 (×2): 2 g via INTRAVENOUS
  Filled 2019-04-03 (×2): qty 100

## 2019-04-03 MED ORDER — BISACODYL 10 MG RE SUPP: 10.0000 mg | Freq: Every day | RECTAL | Status: DC | PRN

## 2019-04-03 MED ORDER — SODIUM CHLORIDE (PF) 0.9 % IJ SOLN
INTRAMUSCULAR | Status: AC
  Filled 2019-04-03: qty 50

## 2019-04-03 MED ORDER — METOCLOPRAMIDE HCL 5 MG PO TABS: 5.0000 mg | ORAL_TABLET | Freq: Three times a day (TID) | ORAL | Status: DC | PRN

## 2019-04-03 MED ORDER — TRANEXAMIC ACID-NACL 1000-0.7 MG/100ML-% IV SOLN
1000.0000 mg | INTRAVENOUS | Status: AC
  Administered 2019-04-03: 10:00:00 1000 mg via INTRAVENOUS
  Filled 2019-04-03: qty 100

## 2019-04-03 MED ORDER — EPHEDRINE SULFATE-NACL 50-0.9 MG/10ML-% IV SOSY
PREFILLED_SYRINGE | INTRAVENOUS | Status: DC | PRN
  Administered 2019-04-03 (×5): 5 mg via INTRAVENOUS

## 2019-04-03 MED ORDER — MAGNESIUM CITRATE PO SOLN: 1.0000 | Freq: Once | ORAL | Status: DC | PRN

## 2019-04-03 MED ORDER — DOCUSATE SODIUM 100 MG PO CAPS
100.0000 mg | ORAL_CAPSULE | Freq: Two times a day (BID) | ORAL | Status: DC
  Administered 2019-04-04: 09:00:00 100 mg via ORAL
  Filled 2019-04-03 (×2): qty 1

## 2019-04-03 MED ORDER — SODIUM CHLORIDE 0.9 % IV SOLN
INTRAVENOUS | Status: DC
  Administered 2019-04-03 – 2019-04-04 (×2): via INTRAVENOUS

## 2019-04-03 MED ORDER — DEXAMETHASONE SODIUM PHOSPHATE 10 MG/ML IJ SOLN
10.0000 mg | Freq: Once | INTRAMUSCULAR | Status: AC
  Administered 2019-04-03: 10:00:00 10 mg via INTRAVENOUS

## 2019-04-03 MED ORDER — ONDANSETRON HCL 4 MG/2ML IJ SOLN: 4.0000 mg | Freq: Four times a day (QID) | INTRAMUSCULAR | Status: DC | PRN

## 2019-04-03 MED ORDER — DIPHENHYDRAMINE HCL 12.5 MG/5ML PO ELIX: 12.5000 mg | ORAL_SOLUTION | ORAL | Status: DC | PRN

## 2019-04-03 MED ORDER — FUROSEMIDE 40 MG PO TABS
40.0000 mg | ORAL_TABLET | Freq: Every day | ORAL | Status: DC
  Administered 2019-04-03 – 2019-04-04 (×2): 40 mg via ORAL
  Filled 2019-04-03 (×2): qty 1

## 2019-04-03 MED ORDER — METHOCARBAMOL 500 MG IVPB - SIMPLE MED
500.0000 mg | Freq: Four times a day (QID) | INTRAVENOUS | Status: DC | PRN
  Filled 2019-04-03: qty 50

## 2019-04-03 MED ORDER — ROPIVACAINE HCL 7.5 MG/ML IJ SOLN
INTRAMUSCULAR | Status: DC | PRN
  Administered 2019-04-03: 20 mL via PERINEURAL

## 2019-04-03 MED ORDER — ASPIRIN 81 MG PO CHEW
81.0000 mg | CHEWABLE_TABLET | Freq: Two times a day (BID) | ORAL | Status: DC
  Administered 2019-04-03 – 2019-04-04 (×2): 81 mg via ORAL
  Filled 2019-04-03 (×2): qty 1

## 2019-04-03 MED ORDER — PROPOFOL 500 MG/50ML IV EMUL
INTRAVENOUS | Status: DC | PRN
  Administered 2019-04-03: 75 ug/kg/min via INTRAVENOUS

## 2019-04-03 MED ORDER — METHOCARBAMOL 500 MG PO TABS
500.0000 mg | ORAL_TABLET | Freq: Four times a day (QID) | ORAL | Status: DC | PRN
  Administered 2019-04-03 – 2019-04-04 (×2): 500 mg via ORAL
  Filled 2019-04-03 (×2): qty 1

## 2019-04-03 MED ORDER — KETOROLAC TROMETHAMINE 30 MG/ML IJ SOLN
INTRAMUSCULAR | Status: AC
  Filled 2019-04-03: qty 1

## 2019-04-03 MED ORDER — MENTHOL 3 MG MT LOZG: 1.0000 | LOZENGE | OROMUCOSAL | Status: DC | PRN

## 2019-04-03 MED ORDER — NITROGLYCERIN 0.4 MG SL SUBL: 0.4000 mg | SUBLINGUAL_TABLET | SUBLINGUAL | Status: DC | PRN

## 2019-04-03 MED ORDER — ONDANSETRON HCL 4 MG PO TABS: 4.0000 mg | ORAL_TABLET | Freq: Four times a day (QID) | ORAL | Status: DC | PRN

## 2019-04-03 MED ORDER — SUCCINYLCHOLINE CHLORIDE 200 MG/10ML IV SOSY
PREFILLED_SYRINGE | INTRAVENOUS | Status: AC
  Filled 2019-04-03: qty 10

## 2019-04-03 MED ORDER — METOCLOPRAMIDE HCL 5 MG/ML IJ SOLN: 5.0000 mg | Freq: Three times a day (TID) | INTRAMUSCULAR | Status: DC | PRN

## 2019-04-03 MED ORDER — BUPIVACAINE-EPINEPHRINE (PF) 0.25% -1:200000 IJ SOLN
INTRAMUSCULAR | Status: DC | PRN
  Administered 2019-04-03: 30 mL

## 2019-04-03 MED ORDER — ONDANSETRON HCL 4 MG/2ML IJ SOLN
INTRAMUSCULAR | Status: DC | PRN
  Administered 2019-04-03: 4 mg via INTRAVENOUS

## 2019-04-03 MED ORDER — SODIUM CHLORIDE (PF) 0.9 % IJ SOLN
INTRAMUSCULAR | Status: DC | PRN
  Administered 2019-04-03: 30 mL

## 2019-04-03 MED ORDER — FERROUS SULFATE 325 (65 FE) MG PO TABS
325.0000 mg | ORAL_TABLET | Freq: Two times a day (BID) | ORAL | Status: DC
  Filled 2019-04-03 (×2): qty 1

## 2019-04-03 MED ORDER — PHENOL 1.4 % MT LIQD: 1.0000 | OROMUCOSAL | Status: DC | PRN

## 2019-04-03 MED ORDER — PROPOFOL 10 MG/ML IV BOLUS
INTRAVENOUS | Status: DC | PRN
  Administered 2019-04-03: 10 mg via INTRAVENOUS

## 2019-04-03 MED ORDER — CHLORHEXIDINE GLUCONATE 4 % EX LIQD: 60.0000 mL | Freq: Once | CUTANEOUS | Status: DC

## 2019-04-03 MED ORDER — PROPOFOL 10 MG/ML IV BOLUS
INTRAVENOUS | Status: AC
  Filled 2019-04-03: qty 60

## 2019-04-03 MED ORDER — KETOROLAC TROMETHAMINE 30 MG/ML IJ SOLN
INTRAMUSCULAR | Status: DC | PRN
  Administered 2019-04-03: 30 mg

## 2019-04-03 MED ORDER — CLONIDINE HCL (ANALGESIA) 100 MCG/ML EP SOLN
EPIDURAL | Status: DC | PRN
  Administered 2019-04-03: 70 ug

## 2019-04-03 MED ORDER — BUPIVACAINE IN DEXTROSE 0.75-8.25 % IT SOLN
INTRATHECAL | Status: DC | PRN
  Administered 2019-04-03: 1.6 mL via INTRATHECAL

## 2019-04-03 SURGICAL SUPPLY — 63 items
ADH SKN CLS APL DERMABOND .7 (GAUZE/BANDAGES/DRESSINGS) ×1
APL PRP STRL LF DISP 70% ISPRP (MISCELLANEOUS) ×2
ATTUNE MED ANAT PAT 38 KNEE (Knees) ×1 IMPLANT
ATTUNE MED ANAT PAT 38MM KNEE (Knees) ×1 IMPLANT
ATTUNE PS FEM RT SZ 7 CEM KNEE (Femur) ×2 IMPLANT
ATTUNE PSRP INSR SZ7 7 KNEE (Insert) ×1 IMPLANT
ATTUNE PSRP INSR SZ7 7MM KNEE (Insert) ×1 IMPLANT
BAG SPEC THK2 15X12 ZIP CLS (MISCELLANEOUS)
BAG ZIPLOCK 12X15 (MISCELLANEOUS) IMPLANT
BANDAGE ACE 6X5 VEL STRL LF (GAUZE/BANDAGES/DRESSINGS) ×3 IMPLANT
BASE TIBIAL ROT PLAT SZ 8 KNEE (Knees) IMPLANT
BLADE SAW SGTL 11.0X1.19X90.0M (BLADE) IMPLANT
BLADE SAW SGTL 13.0X1.19X90.0M (BLADE) ×3 IMPLANT
BLADE SURG SZ10 CARB STEEL (BLADE) ×6 IMPLANT
BOWL SMART MIX CTS (DISPOSABLE) ×3 IMPLANT
BSPLAT TIB 8 CMNT ROT PLAT STR (Knees) ×1 IMPLANT
CEMENT HV SMART SET (Cement) ×4 IMPLANT
CHLORAPREP W/TINT 26 (MISCELLANEOUS) ×6 IMPLANT
COVER SURGICAL LIGHT HANDLE (MISCELLANEOUS) ×3 IMPLANT
COVER WAND RF STERILE (DRAPES) IMPLANT
CUFF TOURN SGL QUICK 34 (TOURNIQUET CUFF) ×3
CUFF TRNQT CYL 34X4.125X (TOURNIQUET CUFF) ×1 IMPLANT
DECANTER SPIKE VIAL GLASS SM (MISCELLANEOUS) ×6 IMPLANT
DERMABOND ADVANCED (GAUZE/BANDAGES/DRESSINGS) ×2
DERMABOND ADVANCED .7 DNX12 (GAUZE/BANDAGES/DRESSINGS) ×1 IMPLANT
DRAPE U-SHAPE 47X51 STRL (DRAPES) ×3 IMPLANT
DRESSING AQUACEL AG SP 3.5X10 (GAUZE/BANDAGES/DRESSINGS) ×1 IMPLANT
DRSG AQUACEL AG SP 3.5X10 (GAUZE/BANDAGES/DRESSINGS) ×3
ELECT REM PT RETURN 15FT ADLT (MISCELLANEOUS) ×3 IMPLANT
GLOVE BIO SURGEON STRL SZ 6 (GLOVE) ×3 IMPLANT
GLOVE BIOGEL PI IND STRL 6.5 (GLOVE) ×1 IMPLANT
GLOVE BIOGEL PI IND STRL 7.5 (GLOVE) ×1 IMPLANT
GLOVE BIOGEL PI IND STRL 8.5 (GLOVE) ×1 IMPLANT
GLOVE BIOGEL PI INDICATOR 6.5 (GLOVE) ×2
GLOVE BIOGEL PI INDICATOR 7.5 (GLOVE) ×2
GLOVE BIOGEL PI INDICATOR 8.5 (GLOVE) ×2
GLOVE ECLIPSE 8.0 STRL XLNG CF (GLOVE) ×3 IMPLANT
GLOVE ORTHO TXT STRL SZ7.5 (GLOVE) ×3 IMPLANT
GOWN STRL REUS W/ TWL LRG LVL3 (GOWN DISPOSABLE) ×1 IMPLANT
GOWN STRL REUS W/TWL 2XL LVL3 (GOWN DISPOSABLE) ×3 IMPLANT
GOWN STRL REUS W/TWL LRG LVL3 (GOWN DISPOSABLE) ×6 IMPLANT
HANDPIECE INTERPULSE COAX TIP (DISPOSABLE) ×3
HOLDER FOLEY CATH W/STRAP (MISCELLANEOUS) IMPLANT
KIT TURNOVER KIT A (KITS) IMPLANT
MANIFOLD NEPTUNE II (INSTRUMENTS) ×3 IMPLANT
NDL SAFETY ECLIPSE 18X1.5 (NEEDLE) IMPLANT
NEEDLE HYPO 18GX1.5 SHARP (NEEDLE)
NS IRRIG 1000ML POUR BTL (IV SOLUTION) ×3 IMPLANT
PACK TOTAL KNEE CUSTOM (KITS) ×3 IMPLANT
PROTECTOR NERVE ULNAR (MISCELLANEOUS) ×3 IMPLANT
SET HNDPC FAN SPRY TIP SCT (DISPOSABLE) ×1 IMPLANT
SET PAD KNEE POSITIONER (MISCELLANEOUS) ×3 IMPLANT
SUT MNCRL AB 4-0 PS2 18 (SUTURE) ×3 IMPLANT
SUT STRATAFIX PDS+ 0 24IN (SUTURE) ×3 IMPLANT
SUT VIC AB 1 CT1 36 (SUTURE) ×3 IMPLANT
SUT VIC AB 2-0 CT1 27 (SUTURE) ×9
SUT VIC AB 2-0 CT1 TAPERPNT 27 (SUTURE) ×3 IMPLANT
SYR 3ML LL SCALE MARK (SYRINGE) ×3 IMPLANT
TIBIAL BASE ROT PLAT SZ 8 KNEE (Knees) ×3 IMPLANT
TRAY FOLEY MTR SLVR 16FR STAT (SET/KITS/TRAYS/PACK) ×3 IMPLANT
WATER STERILE IRR 1000ML POUR (IV SOLUTION) ×6 IMPLANT
WRAP KNEE MAXI GEL POST OP (GAUZE/BANDAGES/DRESSINGS) ×3 IMPLANT
YANKAUER SUCT BULB TIP 10FT TU (MISCELLANEOUS) ×3 IMPLANT

## 2019-04-03 NOTE — Addendum Note (Signed)
Addendum  created 04/03/19 1320 by Myrtie Soman, MD   Clinical Note Signed

## 2019-04-03 NOTE — Anesthesia Procedure Notes (Signed)
Anesthesia Regional Block: Adductor canal block   Pre-Anesthetic Checklist: ,, timeout performed, Correct Patient, Correct Site, Correct Laterality, Correct Procedure, Correct Position, site marked, Risks and benefits discussed,  Surgical consent,  Pre-op evaluation,  At surgeon's request and post-op pain management  Laterality: Right  Prep: chloraprep       Needles:  Injection technique: Single-shot  Needle Type: Stimiplex     Needle Length: 9cm      Additional Needles:   Procedures:,,,, ultrasound used (permanent image in chart),,,,  Narrative:  Start time: 04/03/2019 10:00 AM End time: 04/03/2019 10:07 AM Injection made incrementally with aspirations every 5 mL.  Performed by: Personally  Anesthesiologist: Myrtie Soman, MD  Additional Notes: Patient tolerated the procedure well without complications

## 2019-04-03 NOTE — Op Note (Signed)
NAME:  [name] [name]                      MEDICAL RECORD NO.:  [id number]                             FACILITY:  [facility]      PHYSICIAN:  Pietro Cassis. [name], M.D.  DATE OF BIRTH:  10-17-1941      DATE OF PROCEDURE:  04/03/2019                                     OPERATIVE REPORT         PREOPERATIVE DIAGNOSIS:  Right knee osteoarthritis.      POSTOPERATIVE DIAGNOSIS:  Right knee osteoarthritis.      FINDINGS:  The patient was noted to have complete loss of cartilage and   bone-on-bone arthritis with associated osteophytes in the medial and patellofemoral compartments of   the knee with a significant synovitis and associated effusion.  The patient had failed months of conservative treatment including medications, injection therapy, activity modification.     PROCEDURE:  Right total knee replacement.      COMPONENTS USED:  DePuy Attune rotating platform posterior stabilized knee   system, a size 7 femur, 8 tibia, size 7 mm PS AOX insert, and 38 anatomic patellar   button.      SURGEON:  Pietro Cassis. [name], M.D.      ASSISTANT:  Danae Orleans, PA-C.      ANESTHESIA:  Regional and Spinal.      SPECIMENS:  None.      COMPLICATION:  None.      DRAINS:  None.  EBL: <100cc      TOURNIQUET TIME:   Total Tourniquet Time Documented: Thigh (Right) - 28 minutes Total: Thigh (Right) - 28 minutes  .      The patient was stable to the recovery room.      INDICATION FOR PROCEDURE:  [name] [name] is a 78 y.o. male patient of   mine.  The patient had been seen, evaluated, and treated for months conservatively in the   office with medication, activity modification, and injections.  The patient had   radiographic changes of bone-on-bone arthritis with endplate sclerosis and osteophytes noted.  Based on the radiographic changes and failed conservative measures, the patient   decided to proceed with definitive treatment, total knee replacement.  Risks of infection, DVT, component failure, need for  revision surgery, neurovascular injury were reviewed in the office setting.  The postop course was reviewed stressing the efforts to maximize post-operative satisfaction and function.  Consent was obtained for benefit of pain   relief.      PROCEDURE IN DETAIL:  The patient was brought to the operative theater.   Once adequate anesthesia, preoperative antibiotics, 2 gm of Ancef,1 gm of Tranexamic Acid, and 10 mg of Decadron administered, the patient was positioned supine with a right thigh tourniquet placed.  The  right lower extremity was prepped and draped in sterile fashion.  A time-   out was performed identifying the patient, planned procedure, and the appropriate extremity.      The right lower extremity was placed in the [facility] leg holder.  The leg was   exsanguinated, tourniquet elevated to 250 mmHg.  A midline incision was   made followed by  median parapatellar arthrotomy.  Following initial   exposure, attention was first directed to the patella.  Precut   measurement was noted to be 26 mm.  I resected down to 15 mm and used a   38 anatomic patellar button to restore patellar height as well as cover the cut surface.      The lug holes were drilled and a metal shim was placed to protect the   patella from retractors and saw blade during the procedure.      At this point, attention was now directed to the femur.  The femoral   canal was opened with a drill, irrigated to try to prevent fat emboli.  An   intramedullary rod was passed at 5 degrees valgus, 9 mm of bone was   resected off the distal femur.  Following this resection, the tibia was   subluxated anteriorly.  Using the extramedullary guide, 2 mm of bone was resected off   the proximal medial tibia.  We confirmed the gap would be   stable medially and laterally with a size 5 spacer block as well as confirmed that the tibial cut was perpendicular in the coronal plane, checking with an alignment rod.      Once this was done, I  sized the femur to be a size 7 in the anterior-   posterior dimension, chose a standard component based on medial and   lateral dimension.  The size 7 rotation block was then pinned in   position anterior referenced using the C-clamp to set rotation.  The   anterior, posterior, and  chamfer cuts were made without difficulty nor   notching making certain that I was along the anterior cortex to help   with flexion gap stability.      The final box cut was made off the lateral aspect of distal femur.      At this point, the tibia was sized to be a size 8.  The size 8 tray was   then pinned in position through the medial third of the tubercle,   drilled, and keel punched.  Trial reduction was now carried with a 7 femur,  8 tibia, a size 6 then 7 mm PS insert, and the 38 anatomic patella botton.  The knee was brought to full extension with good flexion stability with the patella   tracking through the trochlea without application of pressure.  Given   all these findings the trial components removed.  Final components were   opened and cement was mixed.  The knee was irrigated with normal saline solution and pulse lavage.  The synovial lining was   then injected with 30 cc of 0.25% Marcaine with epinephrine, 1 cc of Toradol and 30 cc of NS for a total of 61 cc.     Final implants were then cemented onto cleaned and dried cut surfaces of bone with the knee brought to extension with a size 7 mm PS trial insert.      Once the cement had fully cured, excess cement was removed   throughout the knee.  I confirmed that I was satisfied with the range of   motion and stability, and the final size 7 mm PS AOX insert was chosen.  It was   placed into the knee.      The tourniquet had been let down at 28 minutes.  No significant   hemostasis was required.  The extensor mechanism was then reapproximated using #1 Vicryl and #  1 Stratafix sutures with the knee   in flexion.  The   remaining wound was closed  with 2-0 Vicryl and running 4-0 Monocryl.   The knee was cleaned, dried, dressed sterilely using Dermabond and   Aquacel dressing.  The patient was then   brought to recovery room in stable condition, tolerating the procedure   well.   Please note that Physician Assistant, Danae Orleans, PA-C was present for the entirety of the case, and was utilized for pre-operative positioning, peri-operative retractor management, general facilitation of the procedure and for primary wound closure at the end of the case.              Pietro Cassis [name], M.D.    04/03/2019 11:40 AM

## 2019-04-03 NOTE — Anesthesia Procedure Notes (Signed)
Anesthesia Procedure Image    

## 2019-04-03 NOTE — Anesthesia Procedure Notes (Signed)
Spinal  Patient location during procedure: OR Start time: 04/03/2019 10:15 AM End time: 04/03/2019 10:20 AM Staffing Anesthesiologist: Myrtie Soman, MD Preanesthetic Checklist Completed: patient identified, site marked, surgical consent, pre-op evaluation, timeout performed, IV checked, risks and benefits discussed and monitors and equipment checked Spinal Block Patient position: sitting Prep: DuraPrep Patient monitoring: heart rate, cardiac monitor, continuous pulse ox and blood pressure Approach: midline Location: L3-4 Injection technique: single-shot Needle Needle type: Sprotte  Needle gauge: 24 G Needle length: 9 cm Assessment Sensory level: T4

## 2019-04-03 NOTE — Evaluation (Signed)
Physical Therapy Evaluation Patient Details Name: [name] [name] MRN: [id number] DOB: 03-19-1941 Today's Date: 04/03/2019   History of Present Illness  78 yo male s/p R TKR on 04/03/19. PMH includes anemia, L TKR, GERD, CAD with history of MI, CKD III with R nephrectomy, skin cancer.   Clinical Impression   Pt presents with R knee pain, difficulty performing mobility tasks, increased time and effort to perform mobility tasks, decreased R knee ROM, and decreased activity tolerance due to pain. Pt to benefit from acute PT to address deficits. Pt ambulated 50 ft with RW with min guard assist, verbal cuing for form and safety provided. Pt educated on ankle pumps (20/hour) to perform this afternoon/evening to increase circulation, to pt's tolerance and limited by pain. PT to progress mobility as tolerated, and will continue to follow acutely.        Follow Up Recommendations Follow surgeon's recommendation for DC plan and follow-up therapies;Supervision for mobility/OOB(OPPT)    Equipment Recommendations  None recommended by PT    Recommendations for Other Services       Precautions / Restrictions Precautions Precautions: Fall Restrictions Weight Bearing Restrictions: No Other Position/Activity Restrictions: WBAT      Mobility  Bed Mobility Overal bed mobility: Needs Assistance Bed Mobility: Supine to Sit     Supine to sit: Min assist;HOB elevated     General bed mobility comments: Min assist for RLE lowering once at EOB, increased time.   Transfers Overall transfer level: Needs assistance Equipment used: Rolling walker (2 wheeled) Transfers: Sit to/from Stand Sit to Stand: Min guard;From elevated surface         General transfer comment: Min guard for safety, verbal cuing for hand placement.   Ambulation/Gait Ambulation/Gait assistance: Min guard Gait Distance (Feet): 50 Feet Assistive device: Rolling walker (2 wheeled) Gait Pattern/deviations: Step-to  pattern;Decreased step length - right;Decreased step length - left;Trunk flexed Gait velocity: decr    General Gait Details: Min guard for safety, verbal cuing for upright posture, sequencing, turning with RW.  Stairs            Wheelchair Mobility    Modified Rankin (Stroke Patients Only)       Balance Overall balance assessment: Mild deficits observed, not formally tested                                           Pertinent Vitals/Pain Pain Assessment: 0-10 Pain Score: 7  Pain Location: R knee Pain Descriptors / Indicators: Sore Pain Intervention(s): Monitored during session;Repositioned;Limited activity within patient's tolerance;Premedicated before session    Home Living Family/patient expects to be discharged to:: Private residence Living Arrangements: Spouse/significant other Available Help at Discharge: Family;Available 24 hours/day Type of Home: House Home Access: Stairs to enter Entrance Stairs-Rails: Psychiatric nurse of Steps: 4-5 Home Layout: One level Home Equipment: Walker - 2 wheels;Crutches;Cane - single point;Bedside commode;Shower seat - built in      Prior Function Level of Independence: Independent with assistive device(s)         Comments: Pt reports using cane or RW, "depending on my need"     Hand Dominance   Dominant Hand: Right    Extremity/Trunk Assessment   Upper Extremity Assessment Upper Extremity Assessment: Overall WFL for tasks assessed    Lower Extremity Assessment Lower Extremity Assessment: Overall WFL for tasks assessed;RLE deficits/detail RLE Deficits / Details: suspected post-surgical weakness; able  to perform ankle pumps, quad set, heel slides, SLR without lift assist RLE Sensation: WNL    Cervical / Trunk Assessment Cervical / Trunk Assessment: Normal  Communication   Communication: HOH  Cognition Arousal/Alertness: Awake/alert Behavior During Therapy: WFL for tasks  assessed/performed Overall Cognitive Status: Within Functional Limits for tasks assessed                                        General Comments      Exercises Total Joint Exercises Goniometric ROM: knee aarom ~10-70*, limited by pain    Assessment/Plan    PT Assessment Patient needs continued PT services  PT Problem List Decreased strength;Decreased mobility;Decreased range of motion;Decreased activity tolerance;Decreased balance;Decreased knowledge of use of DME;Pain       PT Treatment Interventions DME instruction;Functional mobility training;Balance training;Patient/family education;Gait training;Therapeutic activities;Stair training;Therapeutic exercise    PT Goals (Current goals can be found in the Care Plan section)  Acute Rehab PT Goals Patient Stated Goal: walk without pain PT Goal Formulation: With patient Time For Goal Achievement: 04/10/19 Potential to Achieve Goals: Good    Frequency 7X/week   Barriers to discharge        Co-evaluation               AM-PAC PT "6 Clicks" Mobility  Outcome Measure Help needed turning from your back to your side while in a flat bed without using bedrails?: A Little Help needed moving from lying on your back to sitting on the side of a flat bed without using bedrails?: A Little Help needed moving to and from a bed to a chair (including a wheelchair)?: A Little Help needed standing up from a chair using your arms (e.g., wheelchair or bedside chair)?: A Little Help needed to walk in hospital room?: A Little Help needed climbing 3-5 steps with a railing? : A Lot 6 Click Score: 17    End of Session Equipment Utilized During Treatment: Gait belt;Oxygen Activity Tolerance: Patient tolerated treatment well;Patient limited by pain Patient left: in chair;with call bell/phone within reach;with chair alarm set(on SCD break for skin integrity) Nurse Communication: Mobility status PT Visit Diagnosis: Other  abnormalities of gait and mobility (R26.89);Difficulty in walking, not elsewhere classified (R26.2)    Time: 8338-2505 PT Time Calculation (min) (ACUTE ONLY): 22 min   Charges:   PT Evaluation $PT Eval Low Complexity: 1 Low         Sway Guttierrez Conception Chancy, PT Acute Rehabilitation Services Pager [phone]  Office [place] D Elonda Husky 04/03/2019, 7:24 PM

## 2019-04-03 NOTE — Discharge Instructions (Signed)

## 2019-04-03 NOTE — Anesthesia Postprocedure Evaluation (Signed)
Anesthesia Post Note  Patient: [name] [name]  Procedure(s) Performed: RIGHT TOTAL KNEE ARTHROPLASTY (Right Knee)     Patient location during evaluation: PACU Anesthesia Type: Spinal Level of consciousness: oriented and awake and alert Pain management: pain level controlled Vital Signs Assessment: post-procedure vital signs reviewed and stable Respiratory status: spontaneous breathing, respiratory function stable and patient connected to nasal cannula oxygen Cardiovascular status: blood pressure returned to baseline and stable Postop Assessment: no headache, no backache and no apparent nausea or vomiting Anesthetic complications: no    Last Vitals:  Vitals:   04/03/19 1230 04/03/19 1300  BP: 134/84 (!) 153/77  Pulse: 61 (!) 56  Resp: (!) 23 12  Temp:    SpO2: 98% 100%    Last Pain:  Vitals:   04/03/19 1300  PainSc: 0-No pain                 Haivyn Oravec S

## 2019-04-03 NOTE — Anesthesia Postprocedure Evaluation (Signed)
Anesthesia Post Note  Patient: [name] [name]  Procedure(s) Performed: RIGHT TOTAL KNEE ARTHROPLASTY (Right Knee)     Patient location during evaluation: PACU Anesthesia Type: Spinal Level of consciousness: oriented and awake and alert Pain management: pain level controlled Vital Signs Assessment: post-procedure vital signs reviewed and stable Respiratory status: spontaneous breathing, respiratory function stable and patient connected to nasal cannula oxygen Cardiovascular status: blood pressure returned to baseline and stable Postop Assessment: no headache, no backache and no apparent nausea or vomiting Anesthetic complications: no    Last Vitals:  Vitals:   04/03/19 1230 04/03/19 1300  BP: 134/84 (!) 153/77  Pulse: 61 (!) 56  Resp: (!) 23 12  Temp:    SpO2: 98% 100%    Last Pain:  Vitals:   04/03/19 1300  PainSc: 0-No pain                 Kenetha Cozza S

## 2019-04-03 NOTE — Progress Notes (Signed)
Assisted Dr. Rose with right, ultrasound guided, adductor canal block. Side rails up, monitors on throughout procedure. See vital signs in flow sheet. Tolerated Procedure well.  

## 2019-04-03 NOTE — Interval H&P Note (Signed)
History and Physical Interval Note:  04/03/2019 9:01 AM  [name] [name]  has presented today for surgery, with the diagnosis of Right knee osteoarthritis.  The various methods of treatment have been discussed with the patient and family. After consideration of risks, benefits and other options for treatment, the patient has consented to  Procedure(s) with comments: TOTAL KNEE ARTHROPLASTY (Right) - 70 mins as a surgical intervention.  The patient's history has been reviewed, patient examined, no change in status, stable for surgery.  I have reviewed the patient's chart and labs.  Questions were answered to the patient's satisfaction.     [name]

## 2019-04-03 NOTE — Transfer of Care (Signed)
Immediate Anesthesia Transfer of Care Note  Patient: [name] [name]  Procedure(s) Performed: RIGHT TOTAL KNEE ARTHROPLASTY (Right Knee)  Patient Location: PACU  Anesthesia Type:Spinal  Level of Consciousness: awake, alert , oriented and patient cooperative  Airway & Oxygen Therapy: Patient Spontanous Breathing and Patient connected to nasal cannula oxygen  Post-op Assessment: Report given to RN and Post -op Vital signs reviewed and stable  Post vital signs: Reviewed  Last Vitals:  Vitals Value Taken Time  BP    Temp    Pulse 54 04/03/2019 12:12 PM  Resp 18 04/03/2019 12:12 PM  SpO2 100 % 04/03/2019 12:12 PM  Vitals shown include unvalidated device data.  Last Pain:  Vitals:   04/03/19 1013  PainSc: 0-No pain         Complications: No apparent anesthesia complications

## 2019-04-04 ENCOUNTER — Encounter (HOSPITAL_COMMUNITY): Payer: Self-pay | Admitting: Orthopedic Surgery

## 2019-04-04 DIAGNOSIS — M1711 Unilateral primary osteoarthritis, right knee: Secondary | ICD-10-CM | POA: Diagnosis not present

## 2019-04-04 DIAGNOSIS — Z955 Presence of coronary angioplasty implant and graft: Secondary | ICD-10-CM | POA: Diagnosis not present

## 2019-04-04 DIAGNOSIS — I252 Old myocardial infarction: Secondary | ICD-10-CM | POA: Diagnosis not present

## 2019-04-04 DIAGNOSIS — I2510 Atherosclerotic heart disease of native coronary artery without angina pectoris: Secondary | ICD-10-CM | POA: Diagnosis not present

## 2019-04-04 DIAGNOSIS — Z96652 Presence of left artificial knee joint: Secondary | ICD-10-CM | POA: Diagnosis not present

## 2019-04-04 DIAGNOSIS — I4891 Unspecified atrial fibrillation: Secondary | ICD-10-CM | POA: Diagnosis not present

## 2019-04-04 DIAGNOSIS — Z79899 Other long term (current) drug therapy: Secondary | ICD-10-CM | POA: Diagnosis not present

## 2019-04-04 DIAGNOSIS — E1122 Type 2 diabetes mellitus with diabetic chronic kidney disease: Secondary | ICD-10-CM | POA: Diagnosis not present

## 2019-04-04 DIAGNOSIS — N183 Chronic kidney disease, stage 3 (moderate): Secondary | ICD-10-CM | POA: Diagnosis not present

## 2019-04-04 LAB — BASIC METABOLIC PANEL
Anion gap: 9 (ref 5–15)
BUN: 18 mg/dL (ref 8–23)
CO2: 23 mmol/L (ref 22–32)
Calcium: 8.3 mg/dL — ABNORMAL LOW (ref 8.9–10.3)
Chloride: 106 mmol/L (ref 98–111)
Creatinine, Ser: 1.2 mg/dL (ref 0.61–1.24)
GFR calc Af Amer: >60 mL/min (ref >60)
GFR calc non Af Amer: 57 mL/min — ABNORMAL LOW (ref >60)
Glucose, Bld: 130 mg/dL — ABNORMAL HIGH (ref 70–99)
Potassium: 4.5 mmol/L (ref 3.5–5.1)
Sodium: 138 mmol/L (ref 135–145)

## 2019-04-04 LAB — CBC
HCT: 36.5 % — ABNORMAL LOW (ref 39.0–52.0)
Hemoglobin: 11.8 g/dL — ABNORMAL LOW (ref 13.0–17.0)
MCH: 30.6 pg (ref 26.0–34.0)
MCHC: 32.3 g/dL (ref 30.0–36.0)
MCV: 94.8 fL (ref 80.0–100.0)
Platelets: 163 10*3/uL (ref 150–400)
RBC: 3.85 MIL/uL — ABNORMAL LOW (ref 4.22–5.81)
RDW: 13.2 % (ref 11.5–15.5)
WBC: 13.1 10*3/uL — ABNORMAL HIGH (ref 4.0–10.5)
nRBC: 0 % (ref 0.0–0.2)

## 2019-04-04 MED ORDER — POLYETHYLENE GLYCOL 3350 17 G PO PACK: 17.0000 g | PACK | Freq: Two times a day (BID) | ORAL | 0 refills | Status: DC

## 2019-04-04 MED ORDER — PROPOFOL 10 MG/ML IV BOLUS
INTRAVENOUS | Status: AC
  Filled 2019-04-04: qty 40

## 2019-04-04 MED ORDER — METHYLERGONOVINE MALEATE 0.2 MG/ML IJ SOLN
INTRAMUSCULAR | Status: AC
  Filled 2019-04-04: qty 1

## 2019-04-04 MED ORDER — FERROUS SULFATE 325 (65 FE) MG PO TABS: 325.0000 mg | ORAL_TABLET | Freq: Three times a day (TID) | ORAL | 3 refills | Status: DC

## 2019-04-04 MED ORDER — HYDROCODONE-ACETAMINOPHEN 5-325 MG PO TABS: 1.0000 | ORAL_TABLET | ORAL | 0 refills | Status: DC | PRN

## 2019-04-04 MED ORDER — PROPOFOL 10 MG/ML IV BOLUS
INTRAVENOUS | Status: AC
  Filled 2019-04-04: qty 20

## 2019-04-04 MED ORDER — ASPIRIN 81 MG PO CHEW: 81.0000 mg | CHEWABLE_TABLET | Freq: Two times a day (BID) | ORAL | 0 refills | Status: AC

## 2019-04-04 MED ORDER — METHOCARBAMOL 500 MG PO TABS: 500.0000 mg | ORAL_TABLET | Freq: Four times a day (QID) | ORAL | 0 refills | Status: DC | PRN

## 2019-04-04 MED ORDER — DOCUSATE SODIUM 100 MG PO CAPS: 100.0000 mg | ORAL_CAPSULE | Freq: Two times a day (BID) | ORAL | 0 refills | Status: DC

## 2019-04-04 MED ORDER — FENTANYL CITRATE (PF) 100 MCG/2ML IJ SOLN
INTRAMUSCULAR | Status: AC
  Filled 2019-04-04: qty 2

## 2019-04-04 NOTE — Progress Notes (Signed)
Physical Therapy Treatment Patient Details Name: [name] [name] MRN: [id number] DOB: 16-Feb-1941 Today's Date: 04/04/2019    History of Present Illness 78 yo male s/p R TKR on 04/03/19. PMH includes anemia, L TKR, GERD, CAD with history of MI, CKD III with R nephrectomy, skin cancer.     PT Comments    POD # 1 am session Assisted with amb a great distance, practiced stairs Then returned to room to perform some TE's following HEP handout.  Instructed on proper tech, freq as well as use of ICE.Marland Kitchen  Addressed all mobility questions, discussed appropriate activity, educated on use of ICE.  Pt ready for D/C to home.   Follow Up Recommendations  Follow surgeon's recommendation for DC plan and follow-up therapies;Supervision for mobility/OOB     Equipment Recommendations  None recommended by PT    Recommendations for Other Services       Precautions / Restrictions Precautions Precautions: Fall;Knee Restrictions Weight Bearing Restrictions: No Other Position/Activity Restrictions: WBAT    Mobility  Bed Mobility Overal bed mobility: Needs Assistance Bed Mobility: Supine to Sit     Supine to sit: Min assist;HOB elevated     General bed mobility comments: Min assist for RLE lowering once at EOB, increased time.   Transfers Overall transfer level: Needs assistance Equipment used: Rolling walker (2 wheeled) Transfers: Sit to/from Stand Sit to Stand: Min guard;From elevated surface         General transfer comment: Min guard for safety, verbal cuing for hand placement.   Ambulation/Gait Ambulation/Gait assistance: Min guard Gait Distance (Feet): 175 Feet Assistive device: Rolling walker (2 wheeled) Gait Pattern/deviations: Step-to pattern;Decreased step length - right;Decreased step length - left;Trunk flexed Gait velocity: decr    General Gait Details: Min guard for safety, verbal cuing for upright posture, sequencing, turning with RW.   Stairs Stairs: Yes Stairs  assistance: Supervision;Min guard Stair Management: One rail Right;Step to pattern;Forwards;Sideways Number of Stairs: 3 General stair comments: practiced twice 3 steps on rail   Wheelchair Mobility    Modified Rankin (Stroke Patients Only)       Balance                                            Cognition Arousal/Alertness: Awake/alert Behavior During Therapy: WFL for tasks assessed/performed Overall Cognitive Status: Within Functional Limits for tasks assessed                                        Exercises   Total Knee Replacement TE's 10 reps B LE ankle pumps 10 reps towel squeezes 10 reps knee presses 10 reps heel slides  10 reps SAQ's 10 reps SLR's 10 reps ABD Followed by ICE    General Comments        Pertinent Vitals/Pain Pain Assessment: 0-10 Pain Score: 5  Pain Location: R knee Pain Descriptors / Indicators: Sore;Operative site guarding;Tender;Burning Pain Intervention(s): Monitored during session;Repositioned;Premedicated before session    Home Living                      Prior Function            PT Goals (current goals can now be found in the care plan section) Progress towards PT goals: Progressing toward goals  Frequency    7X/week      PT Plan Current plan remains appropriate    Co-evaluation              AM-PAC PT "6 Clicks" Mobility   Outcome Measure  Help needed turning from your back to your side while in a flat bed without using bedrails?: A Little Help needed moving from lying on your back to sitting on the side of a flat bed without using bedrails?: A Little Help needed moving to and from a bed to a chair (including a wheelchair)?: A Little Help needed standing up from a chair using your arms (e.g., wheelchair or bedside chair)?: A Little Help needed to walk in hospital room?: A Little Help needed climbing 3-5 steps with a railing? : A Lot 6 Click Score: 17    End  of Session Equipment Utilized During Treatment: Gait belt Activity Tolerance: Patient tolerated treatment well;Patient limited by pain Patient left: in chair;with call bell/phone within reach;with chair alarm set Nurse Communication: Mobility status(pt ready for D/C to home) PT Visit Diagnosis: Other abnormalities of gait and mobility (R26.89);Difficulty in walking, not elsewhere classified (R26.2)     Time: 9935-7017 PT Time Calculation (min) (ACUTE ONLY): 28 min  Charges:  $Gait Training: 8-22 mins $Therapeutic Exercise: 8-22 mins                     Rica Koyanagi  PTA Acute  Rehabilitation Services Pager      [phone] Office      [phone]

## 2019-04-04 NOTE — Progress Notes (Signed)
Patient discharged to home w/ wife. Given all belongings, instructions. Verbalized understanding of all instructions. Escorted to pov via w/c. 

## 2019-04-04 NOTE — Progress Notes (Signed)
     Subjective: 1 Day Post-Op Procedure(s) (LRB): RIGHT TOTAL KNEE ARTHROPLASTY (Right)   Patient reports pain as mild, pain controlled.  No events throughout the night.  Looking forward to progressing with PT and getting the knee better.  Ready to be discharged home.   Patient's anticipated LOS is less than 2 midnights, meeting these requirements: - Lives within 1 hour of care - Has a competent adult at home to recover with post-op recover - NO history of  - Chronic pain requiring opiods  - Diabetes  - Heart failure  - Heart attack  - Stroke  - DVT/VTE  - Respiratory Failure/COPD  - Anemia  - Advanced Liver disease    Objective:   VITALS:   Vitals:   04/04/19 0128 04/04/19 0518  BP: 121/73 122/66  Pulse: 90 84  Resp: 20 18  Temp: 97.7 F (36.5 C) 97.7 F (36.5 C)  SpO2: 99% 100%    Dorsiflexion/Plantar flexion intact Incision: dressing C/D/I No cellulitis present Compartment soft  LABS Recent Labs    04/04/19 0320  HGB 11.8*  HCT 36.5*  WBC 13.1*  PLT 163    Recent Labs    04/04/19 0320  NA 138  K 4.5  BUN 18  CREATININE 1.20  GLUCOSE 130*     Assessment/Plan: 1 Day Post-Op Procedure(s) (LRB): RIGHT TOTAL KNEE ARTHROPLASTY (Right) Foley cath d/c'ed Advance diet Up with therapy D/C IV fluids Discharge home Follow up in 2 weeks at [facility] ([place]). Follow up with OLIN,Taraji Mungo D in 2 weeks.  Contact information:  [facility]) [address] [place]. Lowry Bala   PAC  04/04/2019, 8:05 AM

## 2019-04-05 NOTE — Discharge Summary (Signed)
Physician Discharge Summary  Patient ID: Ashe Gago MRN: [id number] DOB/AGE: June 27, 1941 78 y.o.  Admit date: 04/03/2019 Discharge date: 04/04/2019   Procedures:  Procedure(s) (LRB): RIGHT TOTAL KNEE ARTHROPLASTY (Right)  Attending Physician:  Dr. [name]   Admission Diagnoses:   Right knee primary OA / pain  Discharge Diagnoses:  Active Problems:   S/P right total knee arthroplasty   Osteoarthritis of right knee  Past Medical History:  Diagnosis Date  . Arthritis    shoulders and ribs   . Atrial fibrillation ([place])    atrial fib/ LOV [name] PA 04/11/12 EPIC, - CHEST X RAY, EKG 5/13 EPIC  . CAD (coronary artery disease)     s/p NSTEMI 04/08;  [place] 02/2007: Proximal LAD 75%, mid LAD 99%, proximal RCA 25%.  PCI:  Cypher DES to the proximal and mid LAD.  Last nuclear study 11/2011 (after a trip to the ED with CP): EF 58%, low risk study with small inferior wall infarct at the mid and basal level, no ischemia.  Last echo 01/2010: Mild LVH, EF 55-60%, mild AI, mild MR, severely dilated LA and RA  . Contrast dye induced nephropathy    Hx ARF secondary to contrast nephropathy  . Dyslipidemia   . Dysrhythmia   . Epididymitis   . History of blood transfusion   . Myocardial infarction ([place]) 2008  . Pleurisy    2012  . Pneumonia    hx of   . 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 Spinal stenosis   . 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 [name],  skin cancer non melanoma    HPI:    [name] [name], 78 y.o. male, has a history of pain and functional disability in the right knee due to arthritis and has failed non-surgical conservative treatments for greater than 12 weeks to include NSAID's and/or analgesics, use of assistive devices and activity modification.  Onset of symptoms was gradual, starting 2+ years ago with gradually worsening course since that time. The patient noted prior procedures on the knee to include  arthroplasty on the left knee(s).  Patient currently rates pain in the right knee(s)  at 10 out of 10 with activity. Patient has worsening of pain with activity and weight bearing, pain that interferes with activities of daily living, pain with passive range of motion, crepitus and joint swelling.  Patient has evidence of periarticular osteophytes and joint space narrowing by imaging studies. There is no active infection.  Risks, benefits and expectations were discussed with the patient.  Risks including but not limited to the risk of anesthesia, blood clots, nerve damage, blood vessel damage, failure of the prosthesis, infection and up to and including death.  Patient understand the risks, benefits and expectations and wishes to proceed with surgery.   PCP: Alycia Rossetti, MD   Discharged Condition: good  Hospital Course:  Patient underwent the above stated procedure on 04/03/2019. Patient tolerated the procedure well and brought to the recovery room in good condition and subsequently to the floor.  POD #1 BP: 122/66 ; Pulse: 84 ; Temp: 97.7 F (36.5 C) ; Resp: 18 Patient reports pain as mild, pain controlled.  No events throughout the night.  Looking forward to progressing with PT and getting the knee better.  Ready to be discharged home. Dorsiflexion/plantar flexion intact, incision: dressing C/D/I, no cellulitis present and compartment soft.   LABS  Basename    HGB     11.8  HCT  36.5    Discharge Exam: General appearance: alert, cooperative and no distress Extremities: Homans sign is negative, no sign of DVT, no edema, redness or tenderness in the calves or thighs and no ulcers, gangrene or trophic changes  Disposition:  Home with follow up in 2 weeks   Follow-up Information    [name], MD In 2 weeks.   Specialty:  Orthopedic Surgery Contact information: [address] [place] [address] [phone]           Discharge Instructions    Call MD / Call 911   Complete by:  As directed    If you experience chest pain or shortness  of breath, CALL 911 and be transported to the hospital emergency room.  If you develope a fever above 101 F, pus (white drainage) or increased drainage or redness at the wound, or calf pain, call your surgeon's office.   Change dressing   Complete by:  As directed    Maintain surgical dressing until follow up in the clinic. If the edges start to pull up, may reinforce with tape. If the dressing is no longer working, may remove and cover with gauze and tape, but must keep the area dry and clean.  Call with any questions or concerns.   Constipation Prevention   Complete by:  As directed    Drink plenty of fluids.  Prune juice may be helpful.  You may use a stool softener, such as Colace (over the counter) 100 mg twice a day.  Use MiraLax (over the counter) for constipation as needed.   Diet - low sodium heart healthy   Complete by:  As directed    Discharge instructions   Complete by:  As directed    Maintain surgical dressing until follow up in the clinic. If the edges start to pull up, may reinforce with tape. If the dressing is no longer working, may remove and cover with gauze and tape, but must keep the area dry and clean.  Follow up in 2 weeks at [facility]. Call with any questions or concerns.   Increase activity slowly as tolerated   Complete by:  As directed    Weight bearing as tolerated with assist device (walker, cane, etc) as directed, use it as long as suggested by your surgeon or therapist, typically at least 4-6 weeks.   TED hose   Complete by:  As directed    Use stockings (TED hose) for 2 weeks on both leg(s).  You may remove them at night for sleeping.      Allergies as of 04/04/2019      Reactions   Contrast Media [iodinated Diagnostic Agents]    Contrast-induced nephropathy requiring dialysis in 02/2007.   Iodine-131 Other (See Comments)   Kidneys shut down   Other Other (See Comments)   Beta blockers cause bradycardia   Promethazine Other (See Comments)    Pt became drowsy and mild altered mental status      Medication List    STOP taking these medications   aspirin EC 81 MG tablet Replaced by:  aspirin 81 MG chewable tablet     TAKE these medications   aspirin 81 MG chewable tablet Commonly known as:  Aspirin Childrens Chew 1 tablet (81 mg total) by mouth 2 (two) times daily for 30 days. Take for 4 weeks, then resume regular dose. Replaces:  aspirin EC 81 MG tablet   atorvastatin 40 MG tablet Commonly known as:  LIPITOR TAKE  1 TABLET (40 MG TOTAL) BY MOUTH EVERY EVENING. What changed:  when to take this   calcitRIOL 0.5 MCG capsule Commonly known as:  ROCALTROL Take 0.5 mcg by mouth daily.   docusate sodium 100 MG capsule Commonly known as:  Colace Take 1 capsule (100 mg total) by mouth 2 (two) times daily.   ferrous sulfate 325 (65 FE) MG tablet Commonly known as:  FerrouSul Take 1 tablet (325 mg total) by mouth 3 (three) times daily with meals.   Fish Oil 1200 MG Caps Take 1,200 mg by mouth daily.   furosemide 40 MG tablet Commonly known as:  LASIX TAKE 1 TABLET EVERY DAY   HYDROcodone-acetaminophen 5-325 MG tablet Commonly known as:  NORCO/VICODIN Take 1-2 tablets by mouth every 4 (four) hours as needed. What changed:    when to take this  reasons to take this   methocarbamol 500 MG tablet Commonly known as:  Robaxin Take 1 tablet (500 mg total) by mouth every 6 (six) hours as needed for muscle spasms.   Niacin Flush Free 500 MG Caps Generic drug:  Inositol Niacinate Take 500 mg by mouth daily.   Nitrostat 0.4 MG SL tablet Generic drug:  nitroGLYCERIN Place 1 tablet (0.4 mg total) under the tongue every 5 (five) minutes as needed for chest pain.   polyethylene glycol 17 g packet Commonly known as:  MIRALAX / GLYCOLAX Take 17 g by mouth 2 (two) times daily.   vitamin B-12 500 MCG tablet Commonly known as:  CYANOCOBALAMIN Take 500 mcg by mouth daily.            Discharge Care Instructions   (From admission, onward)         Start     Ordered   04/04/19 0000  Change dressing    Comments:  Maintain surgical dressing until follow up in the clinic. If the edges start to pull up, may reinforce with tape. If the dressing is no longer working, may remove and cover with gauze and tape, but must keep the area dry and clean.  Call with any questions or concerns.   04/04/19 0809           Signed: West Pugh. Jaquanna Ballentine   PA-C  04/05/2019, 8:07 AM

## 2019-04-06 DIAGNOSIS — M25561 Pain in right knee: Secondary | ICD-10-CM | POA: Diagnosis not present

## 2019-04-06 DIAGNOSIS — M25461 Effusion, right knee: Secondary | ICD-10-CM | POA: Diagnosis not present

## 2019-04-06 DIAGNOSIS — R262 Difficulty in walking, not elsewhere classified: Secondary | ICD-10-CM | POA: Diagnosis not present

## 2019-04-06 DIAGNOSIS — M6281 Muscle weakness (generalized): Secondary | ICD-10-CM | POA: Diagnosis not present

## 2019-04-06 DIAGNOSIS — M25661 Stiffness of right knee, not elsewhere classified: Secondary | ICD-10-CM | POA: Diagnosis not present

## 2019-04-10 DIAGNOSIS — M6281 Muscle weakness (generalized): Secondary | ICD-10-CM | POA: Diagnosis not present

## 2019-04-10 DIAGNOSIS — R262 Difficulty in walking, not elsewhere classified: Secondary | ICD-10-CM | POA: Diagnosis not present

## 2019-04-10 DIAGNOSIS — M25561 Pain in right knee: Secondary | ICD-10-CM | POA: Diagnosis not present

## 2019-04-10 DIAGNOSIS — M25661 Stiffness of right knee, not elsewhere classified: Secondary | ICD-10-CM | POA: Diagnosis not present

## 2019-04-10 DIAGNOSIS — M25461 Effusion, right knee: Secondary | ICD-10-CM | POA: Diagnosis not present

## 2019-04-12 DIAGNOSIS — M6281 Muscle weakness (generalized): Secondary | ICD-10-CM | POA: Diagnosis not present

## 2019-04-12 DIAGNOSIS — M25561 Pain in right knee: Secondary | ICD-10-CM | POA: Diagnosis not present

## 2019-04-12 DIAGNOSIS — M25461 Effusion, right knee: Secondary | ICD-10-CM | POA: Diagnosis not present

## 2019-04-12 DIAGNOSIS — M25661 Stiffness of right knee, not elsewhere classified: Secondary | ICD-10-CM | POA: Diagnosis not present

## 2019-04-12 DIAGNOSIS — R262 Difficulty in walking, not elsewhere classified: Secondary | ICD-10-CM | POA: Diagnosis not present

## 2019-04-16 DIAGNOSIS — M6281 Muscle weakness (generalized): Secondary | ICD-10-CM | POA: Diagnosis not present

## 2019-04-16 DIAGNOSIS — M25461 Effusion, right knee: Secondary | ICD-10-CM | POA: Diagnosis not present

## 2019-04-16 DIAGNOSIS — M25561 Pain in right knee: Secondary | ICD-10-CM | POA: Diagnosis not present

## 2019-04-16 DIAGNOSIS — M25661 Stiffness of right knee, not elsewhere classified: Secondary | ICD-10-CM | POA: Diagnosis not present

## 2019-04-16 DIAGNOSIS — R262 Difficulty in walking, not elsewhere classified: Secondary | ICD-10-CM | POA: Diagnosis not present

## 2019-04-17 DIAGNOSIS — M25461 Effusion, right knee: Secondary | ICD-10-CM | POA: Diagnosis not present

## 2019-04-17 DIAGNOSIS — M25561 Pain in right knee: Secondary | ICD-10-CM | POA: Diagnosis not present

## 2019-04-17 DIAGNOSIS — M25661 Stiffness of right knee, not elsewhere classified: Secondary | ICD-10-CM | POA: Diagnosis not present

## 2019-04-17 DIAGNOSIS — M6281 Muscle weakness (generalized): Secondary | ICD-10-CM | POA: Diagnosis not present

## 2019-04-17 DIAGNOSIS — R262 Difficulty in walking, not elsewhere classified: Secondary | ICD-10-CM | POA: Diagnosis not present

## 2019-04-19 DIAGNOSIS — M25561 Pain in right knee: Secondary | ICD-10-CM | POA: Diagnosis not present

## 2019-04-19 DIAGNOSIS — M25461 Effusion, right knee: Secondary | ICD-10-CM | POA: Diagnosis not present

## 2019-04-19 DIAGNOSIS — M6281 Muscle weakness (generalized): Secondary | ICD-10-CM | POA: Diagnosis not present

## 2019-04-19 DIAGNOSIS — M25661 Stiffness of right knee, not elsewhere classified: Secondary | ICD-10-CM | POA: Diagnosis not present

## 2019-04-19 DIAGNOSIS — R262 Difficulty in walking, not elsewhere classified: Secondary | ICD-10-CM | POA: Diagnosis not present

## 2019-04-24 DIAGNOSIS — R262 Difficulty in walking, not elsewhere classified: Secondary | ICD-10-CM | POA: Diagnosis not present

## 2019-04-24 DIAGNOSIS — M6281 Muscle weakness (generalized): Secondary | ICD-10-CM | POA: Diagnosis not present

## 2019-04-24 DIAGNOSIS — M25461 Effusion, right knee: Secondary | ICD-10-CM | POA: Diagnosis not present

## 2019-04-24 DIAGNOSIS — M25561 Pain in right knee: Secondary | ICD-10-CM | POA: Diagnosis not present

## 2019-04-24 DIAGNOSIS — M25661 Stiffness of right knee, not elsewhere classified: Secondary | ICD-10-CM | POA: Diagnosis not present

## 2019-04-26 DIAGNOSIS — M25661 Stiffness of right knee, not elsewhere classified: Secondary | ICD-10-CM | POA: Diagnosis not present

## 2019-04-26 DIAGNOSIS — R262 Difficulty in walking, not elsewhere classified: Secondary | ICD-10-CM | POA: Diagnosis not present

## 2019-04-26 DIAGNOSIS — M25461 Effusion, right knee: Secondary | ICD-10-CM | POA: Diagnosis not present

## 2019-04-26 DIAGNOSIS — M25561 Pain in right knee: Secondary | ICD-10-CM | POA: Diagnosis not present

## 2019-04-26 DIAGNOSIS — M6281 Muscle weakness (generalized): Secondary | ICD-10-CM | POA: Diagnosis not present

## 2019-04-27 DIAGNOSIS — R262 Difficulty in walking, not elsewhere classified: Secondary | ICD-10-CM | POA: Diagnosis not present

## 2019-04-27 DIAGNOSIS — M25461 Effusion, right knee: Secondary | ICD-10-CM | POA: Diagnosis not present

## 2019-04-27 DIAGNOSIS — M6281 Muscle weakness (generalized): Secondary | ICD-10-CM | POA: Diagnosis not present

## 2019-04-27 DIAGNOSIS — M25561 Pain in right knee: Secondary | ICD-10-CM | POA: Diagnosis not present

## 2019-04-27 DIAGNOSIS — M25661 Stiffness of right knee, not elsewhere classified: Secondary | ICD-10-CM | POA: Diagnosis not present

## 2019-05-01 DIAGNOSIS — R262 Difficulty in walking, not elsewhere classified: Secondary | ICD-10-CM | POA: Diagnosis not present

## 2019-05-01 DIAGNOSIS — M25561 Pain in right knee: Secondary | ICD-10-CM | POA: Diagnosis not present

## 2019-05-01 DIAGNOSIS — M6281 Muscle weakness (generalized): Secondary | ICD-10-CM | POA: Diagnosis not present

## 2019-05-01 DIAGNOSIS — M25661 Stiffness of right knee, not elsewhere classified: Secondary | ICD-10-CM | POA: Diagnosis not present

## 2019-05-01 DIAGNOSIS — M25461 Effusion, right knee: Secondary | ICD-10-CM | POA: Diagnosis not present

## 2019-05-04 ENCOUNTER — Encounter: Payer: Self-pay | Admitting: Family Medicine

## 2019-05-04 ENCOUNTER — Other Ambulatory Visit: Payer: Self-pay

## 2019-05-04 ENCOUNTER — Ambulatory Visit (INDEPENDENT_AMBULATORY_CARE_PROVIDER_SITE_OTHER): Payer: Medicare HMO | Admitting: Family Medicine

## 2019-05-04 VITALS — BP 134/78 | HR 70 | Temp 98.3°F | Resp 16 | Ht 72.0 in | Wt 194.0 lb

## 2019-05-04 DIAGNOSIS — L309 Dermatitis, unspecified: Secondary | ICD-10-CM

## 2019-05-04 MED ORDER — CLOBETASOL PROPIONATE 0.05 % EX CREA: 1.0000 "application " | TOPICAL_CREAM | Freq: Two times a day (BID) | CUTANEOUS | 0 refills | Status: DC

## 2019-05-04 NOTE — Patient Instructions (Addendum)
Apply cream twice a day  F/U as previous

## 2019-05-04 NOTE — Progress Notes (Signed)
   Subjective:    Patient ID: [name] [name], male    DOB: 1941/04/28, 78 y.o.   MRN: [id number]  Patient presents for Rash (x1 week- itchy red patch to L forearm)   Rash on left forearm for past week, was outside grilling, didn't notice a bite, has severe itching no swelling or drainage from rash, has used hydrocortisone with minimal relief.   Had recent Right knee replacement in May, slowly recuperating, taking norco as needed   Review Of Systems:  GEN- denies fatigue, fever, weight loss,weakness, recent illness HEENT- denies eye drainage, change in vision, nasal discharge, CVS- denies chest pain, palpitations RESP- denies SOB, cough, wheeze ABD- denies N/V, change in stools, abd pain GU- denies dysuria, hematuria, dribbling, incontinence Neuro- denies headache, dizziness, syncope, seizure activity       Objective:    BP 134/78   Pulse 70   Temp 98.3 F (36.8 C) (Oral)   Resp 16   Ht 6' (1.829 m)   Wt 194 lb (88 kg)   SpO2 97%   BMI 26.31 kg/m  GEN- NAD, alert and oriented x3 Skin- left forearm small 2x3 circular erythematous patch, no vesicles, no papules, NT, no warmth,  MSK- FROM forearm/wrist, elbow        Assessment & Plan:      Problem List Items Addressed This Visit    None    Visit Diagnoses    Dermatitis    -  Primary   unknown contact dermatitis, was out grilling, treat with clobetasol, no sign of bacterial superinfection      Note: This dictation was prepared with Dragon dictation along with smaller phrase technology. Any transcriptional errors that result from this process are unintentional.

## 2019-05-08 DIAGNOSIS — M25661 Stiffness of right knee, not elsewhere classified: Secondary | ICD-10-CM | POA: Diagnosis not present

## 2019-05-08 DIAGNOSIS — M25561 Pain in right knee: Secondary | ICD-10-CM | POA: Diagnosis not present

## 2019-05-08 DIAGNOSIS — M6281 Muscle weakness (generalized): Secondary | ICD-10-CM | POA: Diagnosis not present

## 2019-05-08 DIAGNOSIS — R262 Difficulty in walking, not elsewhere classified: Secondary | ICD-10-CM | POA: Diagnosis not present

## 2019-05-08 DIAGNOSIS — M25461 Effusion, right knee: Secondary | ICD-10-CM | POA: Diagnosis not present

## 2019-05-10 DIAGNOSIS — M6281 Muscle weakness (generalized): Secondary | ICD-10-CM | POA: Diagnosis not present

## 2019-05-10 DIAGNOSIS — M25561 Pain in right knee: Secondary | ICD-10-CM | POA: Diagnosis not present

## 2019-05-10 DIAGNOSIS — M25461 Effusion, right knee: Secondary | ICD-10-CM | POA: Diagnosis not present

## 2019-05-10 DIAGNOSIS — R262 Difficulty in walking, not elsewhere classified: Secondary | ICD-10-CM | POA: Diagnosis not present

## 2019-05-10 DIAGNOSIS — M25661 Stiffness of right knee, not elsewhere classified: Secondary | ICD-10-CM | POA: Diagnosis not present

## 2019-05-14 DIAGNOSIS — M6281 Muscle weakness (generalized): Secondary | ICD-10-CM | POA: Diagnosis not present

## 2019-05-14 DIAGNOSIS — M25461 Effusion, right knee: Secondary | ICD-10-CM | POA: Diagnosis not present

## 2019-05-14 DIAGNOSIS — M25661 Stiffness of right knee, not elsewhere classified: Secondary | ICD-10-CM | POA: Diagnosis not present

## 2019-05-14 DIAGNOSIS — M25561 Pain in right knee: Secondary | ICD-10-CM | POA: Diagnosis not present

## 2019-05-14 DIAGNOSIS — R262 Difficulty in walking, not elsewhere classified: Secondary | ICD-10-CM | POA: Diagnosis not present

## 2019-05-16 DIAGNOSIS — R262 Difficulty in walking, not elsewhere classified: Secondary | ICD-10-CM | POA: Diagnosis not present

## 2019-05-16 DIAGNOSIS — M25461 Effusion, right knee: Secondary | ICD-10-CM | POA: Diagnosis not present

## 2019-05-16 DIAGNOSIS — M25561 Pain in right knee: Secondary | ICD-10-CM | POA: Diagnosis not present

## 2019-05-16 DIAGNOSIS — M6281 Muscle weakness (generalized): Secondary | ICD-10-CM | POA: Diagnosis not present

## 2019-05-16 DIAGNOSIS — M25661 Stiffness of right knee, not elsewhere classified: Secondary | ICD-10-CM | POA: Diagnosis not present

## 2019-05-17 DIAGNOSIS — Z471 Aftercare following joint replacement surgery: Secondary | ICD-10-CM | POA: Diagnosis not present

## 2019-05-17 DIAGNOSIS — M170 Bilateral primary osteoarthritis of knee: Secondary | ICD-10-CM | POA: Diagnosis not present

## 2019-05-17 DIAGNOSIS — Z96651 Presence of right artificial knee joint: Secondary | ICD-10-CM | POA: Diagnosis not present

## 2019-05-21 DIAGNOSIS — M25661 Stiffness of right knee, not elsewhere classified: Secondary | ICD-10-CM | POA: Diagnosis not present

## 2019-05-21 DIAGNOSIS — R262 Difficulty in walking, not elsewhere classified: Secondary | ICD-10-CM | POA: Diagnosis not present

## 2019-05-21 DIAGNOSIS — M25561 Pain in right knee: Secondary | ICD-10-CM | POA: Diagnosis not present

## 2019-05-21 DIAGNOSIS — M6281 Muscle weakness (generalized): Secondary | ICD-10-CM | POA: Diagnosis not present

## 2019-05-21 DIAGNOSIS — M25461 Effusion, right knee: Secondary | ICD-10-CM | POA: Diagnosis not present

## 2019-05-23 DIAGNOSIS — M25661 Stiffness of right knee, not elsewhere classified: Secondary | ICD-10-CM | POA: Diagnosis not present

## 2019-05-23 DIAGNOSIS — M25561 Pain in right knee: Secondary | ICD-10-CM | POA: Diagnosis not present

## 2019-05-23 DIAGNOSIS — R262 Difficulty in walking, not elsewhere classified: Secondary | ICD-10-CM | POA: Diagnosis not present

## 2019-05-23 DIAGNOSIS — M25461 Effusion, right knee: Secondary | ICD-10-CM | POA: Diagnosis not present

## 2019-05-23 DIAGNOSIS — M6281 Muscle weakness (generalized): Secondary | ICD-10-CM | POA: Diagnosis not present

## 2019-05-27 ENCOUNTER — Emergency Department (HOSPITAL_COMMUNITY)
Admission: EM | Admit: 2019-05-27 | Discharge: 2019-05-27 | Disposition: A | Discharge status: Home / Self Care | Payer: Medicare HMO | Attending: Emergency Medicine | Admitting: Emergency Medicine

## 2019-05-27 ENCOUNTER — Encounter (HOSPITAL_COMMUNITY): Payer: Self-pay | Admitting: Emergency Medicine

## 2019-05-27 ENCOUNTER — Other Ambulatory Visit: Payer: Self-pay

## 2019-05-27 ENCOUNTER — Emergency Department (HOSPITAL_COMMUNITY): Payer: Medicare HMO

## 2019-05-27 DIAGNOSIS — Y999 Unspecified external cause status: Secondary | ICD-10-CM | POA: Insufficient documentation

## 2019-05-27 DIAGNOSIS — N183 Chronic kidney disease, stage 3 (moderate): Secondary | ICD-10-CM | POA: Insufficient documentation

## 2019-05-27 DIAGNOSIS — M546 Pain in thoracic spine: Secondary | ICD-10-CM | POA: Diagnosis not present

## 2019-05-27 DIAGNOSIS — I252 Old myocardial infarction: Secondary | ICD-10-CM | POA: Insufficient documentation

## 2019-05-27 DIAGNOSIS — M5187 Other intervertebral disc disorders, lumbosacral region: Secondary | ICD-10-CM | POA: Diagnosis not present

## 2019-05-27 DIAGNOSIS — Y929 Unspecified place or not applicable: Secondary | ICD-10-CM | POA: Diagnosis not present

## 2019-05-27 DIAGNOSIS — S32038A Other fracture of third lumbar vertebra, initial encounter for closed fracture: Secondary | ICD-10-CM | POA: Diagnosis not present

## 2019-05-27 DIAGNOSIS — W19XXXA Unspecified fall, initial encounter: Secondary | ICD-10-CM

## 2019-05-27 DIAGNOSIS — I4891 Unspecified atrial fibrillation: Secondary | ICD-10-CM | POA: Diagnosis not present

## 2019-05-27 DIAGNOSIS — Y9301 Activity, walking, marching and hiking: Secondary | ICD-10-CM | POA: Insufficient documentation

## 2019-05-27 DIAGNOSIS — W01190A Fall on same level from slipping, tripping and stumbling with subsequent striking against furniture, initial encounter: Secondary | ICD-10-CM | POA: Diagnosis not present

## 2019-05-27 DIAGNOSIS — I700 Atherosclerosis of aorta: Secondary | ICD-10-CM | POA: Diagnosis not present

## 2019-05-27 DIAGNOSIS — S32030A Wedge compression fracture of third lumbar vertebra, initial encounter for closed fracture: Secondary | ICD-10-CM | POA: Diagnosis not present

## 2019-05-27 DIAGNOSIS — Z87891 Personal history of nicotine dependence: Secondary | ICD-10-CM | POA: Diagnosis not present

## 2019-05-27 DIAGNOSIS — M47816 Spondylosis without myelopathy or radiculopathy, lumbar region: Secondary | ICD-10-CM | POA: Diagnosis not present

## 2019-05-27 DIAGNOSIS — M545 Low back pain, unspecified: Secondary | ICD-10-CM

## 2019-05-27 DIAGNOSIS — S3992XA Unspecified injury of lower back, initial encounter: Secondary | ICD-10-CM | POA: Diagnosis present

## 2019-05-27 DIAGNOSIS — I2510 Atherosclerotic heart disease of native coronary artery without angina pectoris: Secondary | ICD-10-CM | POA: Diagnosis not present

## 2019-05-27 DIAGNOSIS — M47817 Spondylosis without myelopathy or radiculopathy, lumbosacral region: Secondary | ICD-10-CM | POA: Diagnosis not present

## 2019-05-27 DIAGNOSIS — M2578 Osteophyte, vertebrae: Secondary | ICD-10-CM | POA: Diagnosis not present

## 2019-05-27 DIAGNOSIS — Z96651 Presence of right artificial knee joint: Secondary | ICD-10-CM | POA: Diagnosis not present

## 2019-05-27 DIAGNOSIS — M4317 Spondylolisthesis, lumbosacral region: Secondary | ICD-10-CM | POA: Diagnosis not present

## 2019-05-27 MED ORDER — LIDOCAINE 5 % EX OINT: 1.0000 "application " | TOPICAL_OINTMENT | Freq: Three times a day (TID) | CUTANEOUS | 0 refills | Status: DC | PRN

## 2019-05-27 NOTE — ED Provider Notes (Signed)
Emergency Department Provider Note   I have reviewed the triage vital signs and the nursing notes.   HISTORY  Chief Complaint Fall   HPI [name] [name] is a 78 y.o. male with PMH of CAD and A-fib presents to the emergency department for evaluation after mechanical fall.  Patient states that he was walking after recent knee replacement and felt like his legs gave out.  He denies any loss of consciousness or presyncope symptoms.  No chest pain or shortness of breath.  He fell backwards against some furniture and struck the lower part of his back during the fall.  He denies any weakness or numbness in the lower extremities.  No bowel or bladder incontinence.  He describes bilateral soreness in the lower back.  No significant/severe midline tenderness. Pain worse with movement.    Past Medical History:  Diagnosis Date  . Arthritis    shoulders and ribs   . Atrial fibrillation ([place])    atrial fib/ LOV [name] PA 04/11/12 EPIC, - CHEST X RAY, EKG 5/13 EPIC  . CAD (coronary artery disease)     s/p NSTEMI 04/08;  [place] 02/2007: Proximal LAD 75%, mid LAD 99%, proximal RCA 25%.  PCI:  Cypher DES to the proximal and mid LAD.  Last nuclear study 11/2011 (after a trip to the ED with CP): EF 58%, low risk study with small inferior wall infarct at the mid and basal level, no ischemia.  Last echo 01/2010: Mild LVH, EF 55-60%, mild AI, mild MR, severely dilated LA and RA  . Contrast dye induced nephropathy    Hx ARF secondary to contrast nephropathy  . Dyslipidemia   . Dysrhythmia   . Epididymitis   . History of blood transfusion   . Myocardial infarction ([place]) 2008  . Pleurisy    2012  . Pneumonia    hx of   . 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 Spinal stenosis   . Urothelial cancer ([place])    Dr. [name],  skin cancer non melanoma    Patient Active Problem List   Diagnosis Date Noted  . S/P right total knee arthroplasty 04/03/2019  . Osteoarthritis of right knee 04/03/2019  . Anemia 01/10/2019  .  Chest pain 09/12/2018  . Constipation 09/10/2018  . Overweight (BMI 25.0-29.9) 09/06/2018  . S/P left TKA 09/05/2018  . 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 Elevated PSA 09/14/2017  . Primary osteoarthritis of left knee 01/27/2017  . OA (osteoarthritis) of knee 01/26/2017  . Spinal stenosis of lumbar region 12/08/2016  . GERD (gastroesophageal reflux disease) 11/26/2011  . Mixed hyperlipidemia 10/05/2008  . CAD (coronary artery disease) 10/05/2008  . Atrial fibrillation ([place]) 10/05/2008  . CKD (chronic kidney disease), stage III ([place]) 10/05/2008    Past Surgical History:  Procedure Laterality Date  . arm surgery     orif right elbow  . CARDIAC CATHETERIZATION  2008  . coronary stents     . CYSTECTOMY  07/15/08  . CYSTOSCOPY WITH BIOPSY  03/23/2012   Procedure: CYSTOSCOPY WITH BIOPSY;  Surgeon: [name], MD;  Location: [facility];  Service: Urology;  Laterality: Right;   BRUSH BIOPSY RIGHT URETERAL STENT  . CYSTOSCOPY WITH URETEROSCOPY  03/23/2012   Procedure: CYSTOSCOPY WITH URETEROSCOPY;  Surgeon: [name], MD;  Location: [facility];  Service: Urology;  Laterality: Right;    **OR Room #8 requested**  C-ARM   . CYSTOSCOPY/RETROGRADE/URETEROSCOPY  02/15/2012   Procedure: CYSTOSCOPY/RETROGRADE/URETEROSCOPY;  Surgeon: [name], MD;  Location: [facility];  Service: Urology;  Laterality: Right;  C-ARM  . JOINT REPLACEMENT     Dr. [name] 09-05-18   . left pinky finger      tip removed from accident  . right kidney removed      2013  . TOTAL KNEE ARTHROPLASTY Left 09/05/2018   Procedure: LEFT TOTAL KNEE ARTHROPLASTY;  Surgeon: [name], MD;  Location: [facility];  Service: Orthopedics;  Laterality: Left;  70 mins with abductor block  . TOTAL KNEE ARTHROPLASTY Right 04/03/2019   Procedure: RIGHT TOTAL KNEE ARTHROPLASTY;  Surgeon: [name], MD;  Location: [facility];  Service: Orthopedics;  Laterality: Right;  70 mins  . URETER SURGERY      Allergies Contrast media [iodinated  diagnostic agents], Iodine-131, Other, and Promethazine  Family History  Problem Relation Age of Onset  . Cancer Mother   . Diabetes type II Mother   . Hyperlipidemia Mother   . Coronary artery disease Father   . Heart attack Father     Social History Social History   Tobacco Use  . Smoking status: Former Smoker    Packs/day: 2.00    Years: 10.00    Pack years: 20.00    Types: Cigarettes, Cigars    Quit date: 11/15/1981    Years since quitting: 37.5  . Smokeless tobacco: Never Used  Substance Use Topics  . Alcohol use: Yes    Comment: SOCIAL  . Drug use: No    Review of Systems  Constitutional: No fever/chills Eyes: No visual changes. ENT: No sore throat. Cardiovascular: Denies chest pain. Respiratory: Denies shortness of breath. Gastrointestinal: No abdominal pain.  No nausea, no vomiting.  No diarrhea.  No constipation. Genitourinary: Negative for dysuria. Musculoskeletal: Positive for back pain. Skin: Negative for rash. Neurological: Negative for headaches, focal weakness or numbness.  10-point ROS otherwise negative.  ____________________________________________   PHYSICAL EXAM:  VITAL SIGNS: ED Triage Vitals  Enc Vitals Group     BP 05/27/19 0744 (!) 146/72     Pulse Rate 05/27/19 0744 80     Resp --      Temp 05/27/19 0744 98.6 F (37 C)     Temp Source 05/27/19 0744 Oral     SpO2 05/27/19 0744 97 %     Weight 05/27/19 0753 188 lb (85.3 kg)     Height 05/27/19 0753 6' (1.829 m)   Constitutional: Alert and oriented. Well appearing and in no acute distress. Eyes: Conjunctivae are normal.  Head: Atraumatic. Nose: No congestion/rhinnorhea. Mouth/Throat: Mucous membranes are moist. Neck: No stridor.  Cardiovascular: Normal rate, regular rhythm. Respiratory: Normal respiratory effort.  Gastrointestinal: No distention.  Musculoskeletal: No lower extremity tenderness nor edema. No gross deformities of extremities. No midline cervical, thoracic, or  lumbar spine tenderness to palpation. Mild paraspinal tenderness in the lumbar region bilaterally.  Neurologic:  Normal speech and language. No gross focal neurologic deficits are appreciated. Normal strength and sensation in the bilateral LEs.  Skin:  Skin is warm, dry and intact. No rash noted. ____________________________________________  EKG   EKG Interpretation  Date/Time:  Sunday May 27 2019 07:52:39 EDT Ventricular Rate:  74 PR Interval:    QRS Duration: 97 QT Interval:  393 QTC Calculation: 436 R Axis:   12 Text Interpretation:  Atrial fibrillation Probable anterior infarct, age indeterminate No STEMI  Confirmed by Nanda Quinton [id number]) on 05/27/2019 7:54:46 AM       ____________________________________________  RADIOLOGY  Dg Lumbar Spine 2-3 Views  Result  Date: 05/27/2019 CLINICAL DATA:  Pain following fall EXAM: LUMBAR SPINE - 2-3 VIEW COMPARISON:  September 07, 2016 FINDINGS: Frontal, lateral, and spot lumbosacral lateral images were obtained. There are 5 non-rib-bearing lumbar type vertebral bodies. There is anterior wedging of the L3 vertebral body which appears acute. There is chronic anterior wedging of the T12 vertebral body. There is slight anterior wedging of the L2 vertebral body which appears chronic. No other evident fracture. There is slight retrolisthesis of L5 on S1. No other spondylolisthesis evident. There is moderate disc space narrowing at L5-S1 and L1-2. Other disc spaces appear unremarkable. There are anterior osteophytes at all levels. There are apparent laminated gallstones in the right upper quadrant. There is aortic atherosclerosis. IMPRESSION: Acute appearing anterior wedge fracture of the L3 vertebral body. Chronic appearing wedge fractures at T12 and to a much lesser degree at L2. Slight spondylolisthesis at L5-S1 is felt to be due to underlying spondylosis. Osteoarthritic change, most notable at L1-2 and L5-S1. Apparent noted gallstones right upper  quadrant, best seen on lateral view. Aortic Atherosclerosis (ICD10-I70.0). Electronically Signed   By: Lowella Grip III M.D.   On: 05/27/2019 08:33    ____________________________________________   PROCEDURES  Procedure(s) performed:   Procedures  None  ____________________________________________   INITIAL IMPRESSION / ASSESSMENT AND PLAN / ED COURSE  Pertinent labs & imaging results that were available during my care of the patient were reviewed by me and considered in my medical decision making (see chart for details).   Patient presents to the emergency department for evaluation after mechanical fall with lower back pain.  No red flag signs or symptoms concerning for cord compression or clinically significant fracture.  He does have some focal tenderness in the lumbar spine mostly in the paraspinal regions bilaterally.  Plan for plain films of the lower back given the patient's age.   Patient's plain films reviewed which show a possible acute L3 wedge fracture.  Patient without focal tenderness in this area so question subacute nature of this finding.  No indication at this time for emergency MRI or CT imaging.  Patient's pain is well controlled.  Will provide topical lidocaine ointment and contact information both for his PCP and neurosurgery.  Plan to call next week if symptoms not improved. No RUQ pain or gallbladder type symptoms.  ____________________________________________  FINAL CLINICAL IMPRESSION(S) / ED DIAGNOSES  Final diagnoses:  Fall, initial encounter  Acute midline low back pain without sciatica  Closed compression fracture of third lumbar vertebra, initial encounter ([place])     NEW OUTPATIENT MEDICATIONS STARTED DURING THIS VISIT:  New Prescriptions   LIDOCAINE (XYLOCAINE) 5 % OINTMENT    Apply 1 application topically 3 (three) times daily as needed.    Note:  This document was prepared using Dragon voice recognition software and may include  unintentional dictation errors.  Nanda Quinton, MD Emergency Medicine    Santana Gosdin, [facility], MD 05/27/19 [phone]

## 2019-05-27 NOTE — ED Triage Notes (Signed)
Patient c/o lower back pain after slipping and falling this morning. Per patient fell back hitting wall. Denies hitting head or LOC. Per patient difficulty bending over. Denies any complications with urination or BM.

## 2019-05-27 NOTE — Discharge Instructions (Signed)
You were seen in the emergency department today with lower back pain.  We did find a small compression fracture in the lumbar spine.  This could to be related to the fall but could also be from a prior fall.  I am treating your pain symptoms with topical medications.  Please take as directed.  Please follow-up with your primary care doctor by phone tomorrow and consider spine surgery follow-up in the coming week if your pain continues/worsens.  Return to the emergency department immediately with any worsening pain, weakness in the legs, numbness, or issues controlling your bowels or bladder function.

## 2019-05-30 ENCOUNTER — Ambulatory Visit (INDEPENDENT_AMBULATORY_CARE_PROVIDER_SITE_OTHER): Payer: Medicare HMO | Admitting: Family Medicine

## 2019-05-30 ENCOUNTER — Other Ambulatory Visit: Payer: Self-pay

## 2019-05-30 ENCOUNTER — Encounter: Payer: Self-pay | Admitting: Family Medicine

## 2019-05-30 VITALS — BP 138/82 | HR 76 | Temp 98.8°F | Resp 14 | Ht 72.0 in | Wt 195.0 lb

## 2019-05-30 DIAGNOSIS — S32030A Wedge compression fracture of third lumbar vertebra, initial encounter for closed fracture: Secondary | ICD-10-CM

## 2019-05-30 DIAGNOSIS — K8020 Calculus of gallbladder without cholecystitis without obstruction: Secondary | ICD-10-CM

## 2019-05-30 DIAGNOSIS — K219 Gastro-esophageal reflux disease without esophagitis: Secondary | ICD-10-CM | POA: Diagnosis not present

## 2019-05-30 DIAGNOSIS — S32030D Wedge compression fracture of third lumbar vertebra, subsequent encounter for fracture with routine healing: Secondary | ICD-10-CM

## 2019-05-30 HISTORY — DX: Wedge compression fracture of third lumbar vertebra, subsequent encounter for fracture with routine healing: S32.030D

## 2019-05-30 HISTORY — DX: Calculus of gallbladder without cholecystitis without obstruction: K80.20

## 2019-05-30 MED ORDER — NITROSTAT 0.4 MG SL SUBL: 0.4000 mg | SUBLINGUAL_TABLET | SUBLINGUAL | 4 refills | Status: DC | PRN

## 2019-05-30 MED ORDER — PANTOPRAZOLE SODIUM 40 MG PO TBEC: 40.0000 mg | DELAYED_RELEASE_TABLET | Freq: Every day | ORAL | 3 refills | Status: DC

## 2019-05-30 MED ORDER — HYDROCODONE-ACETAMINOPHEN 5-325 MG PO TABS: 1.0000 | ORAL_TABLET | ORAL | 0 refills | Status: DC | PRN

## 2019-05-30 NOTE — Patient Instructions (Addendum)
Continue pain medications lidoderm okay  Start protonix for your acid reflux F/U as previous   Spinal Compression Fracture  A spinal compression fracture is a collapse of the bones that form the spine (vertebrae). With this type of fracture, the vertebrae become pushed (compressed) into a wedge shape. Most compression fractures happen in the middle or lower part of the spine. What are the causes? This condition may be caused by:  Thinning and loss of density in the bones (osteoporosis). This is the most common cause.  A fall.  A car or motorcycle accident.  Cancer.  Trauma, such as a heavy, direct hit to the head or back. What increases the risk? You are more likely to develop this condition if:  You are 24 years or older.  You have osteoporosis.  You have certain types of cancer, including: ? Multiple myeloma. ? Lymphoma. ? Prostate cancer. ? Lung cancer. ? Breast cancer. What are the signs or symptoms? Symptoms of this condition include:  Severe pain.  Pain that gets worse over time.  Pain that is worse when you stand, walk, sit, or bend.  Sudden pain that is so bad that it is hard for you to move.  Bending or humping of the spine.  Gradual loss of height.  Numbness, tingling, or weakness in the back and legs.  Trouble walking. Your symptoms will depend on the cause of the fracture and how quickly it develops. How is this diagnosed? This condition may be diagnosed based on symptoms, medical history, and a physical exam. During the physical exam, your health care provider may tap along the length of your spine to check for tenderness. Tests may be done to confirm the diagnosis. They may include:  A bone mineral density test to check for osteoporosis.  Imaging tests, such as a spine X-ray, CT scan, or MRI. How is this treated? Treatment for this condition depends on the cause and severity of the condition. Some fractures may heal on their own with supportive  care. Treatment may include:  Pain medicine.  Rest.  A back brace.  Physical therapy exercises.  Medicine to strengthen bone.  Calcium and vitamin D supplements. Fractures that cause the back to become misshapen, cause nerve pain or weakness, or do not respond to other treatment may be treated with surgery. This may include:  Vertebroplasty. Bone cement is injected into the collapsed vertebrae to stabilize them.  Balloon kyphoplasty. The collapsed vertebrae are expanded with a balloon and then bone cement is injected into them.  Spinal fusion. The collapsed vertebrae are connected (fused) to normal vertebrae. Follow these instructions at home: Medicines  Take over-the-counter and prescription medicines only as told by your health care provider.  Do not drive or operate heavy machinery while taking prescription pain medicine.  If you are taking prescription pain medicine, take actions to prevent or treat constipation. Your health care provider may recommend that you: ? Drink enough fluid to keep your urine pale yellow. ? Eat foods that are high in fiber, such as fresh fruits and vegetables, whole grains, and beans. ? Limit foods that are high in fat and processed sugars, such as fried or sweet foods. ? Take an over-the-counter or prescription medicine for constipation. If you have a brace:  Wear the brace as told by your health care provider. Remove it only as told by your health care provider.  Loosen the brace if your fingers or toes tingle, become numb, or turn cold and blue.  Keep  the brace clean.  If the brace is not waterproof: ? Do not let it get wet. ? Cover it with a watertight covering when you take a bath or a shower. Managing pain, stiffness, and swelling   If directed, apply ice to the injured area: ? If you have a removable brace, remove it as told by your health care provider. ? Put ice in a plastic bag. ? Place a towel between your skin and the  bag. ? Leave the ice on for 30 minutes every two hours at first. Then apply the ice as needed. Activity  Rest as told by your health care provider. ? Avoid sitting for a long time without moving. Get up to take short walks every 1-2 hours. This is important to improve blood flow and breathing. Ask for help if you feel weak or unsteady.  Return to your normal activities as directed by your health care provider. Ask what activities are safe for you.  Do exercises to improve motion and strength in your back (physical therapy), as recommended by your health care provider.  Exercise regularly as directed by your health care provider. General instructions   Do not drink alcohol. Alcohol can interfere with your treatment.  Do not use any products that contain nicotine or tobacco, such as cigarettes and e-cigarettes. These can delay bone healing. If you need help quitting, ask your health care provider.  Keep all follow-up visits as told by your health care provider. This is important. It can help to prevent permanent injury, disability, and long-lasting (chronic) pain. Contact a health care provider if:  You have a fever.  You develop a cough that makes your pain worse.  Your pain medicine is not helping.  Your pain does not get better over time.  You cannot return to your normal activities as planned or expected. Get help right away if:  Your pain is very bad and it suddenly gets worse.  You are unable to move any body part (paralysis) that is below the level of your injury.  You have numbness, tingling, or weakness in any body part that is below the level of your injury.  You cannot control your bladder or bowels. Summary  A spinal compression fracture is a collapse of the bones that form the spine (vertebrae).  With this type of fracture, the vertebrae become pushed (compressed) into a wedge shape.  Your symptoms and treatment will depend on the cause and severity of the  fracture and how quickly it develops.  Some fractures may heal on their own with supportive care. Fractures that cause the back to become misshapen, cause nerve pain or weakness, or do not respond to other treatment may be treated with surgery. This information is not intended to replace advice given to you by your health care provider. Make sure you discuss any questions you have with your health care provider. Document Released: 11/01/2005 Document Revised: 12/28/2018 Document Reviewed: 12/13/2017 Elsevier Patient Education  2020 [employer].

## 2019-05-30 NOTE — Progress Notes (Signed)
   Subjective:    Patient ID: [name] [name], male    DOB: [birth year], 78 y.o.   MRN: [id number]  Patient presents for ER F/U (S/P fall)   Pt here for ER follow up, seen on 7/12, he was asleep in a recliner and he go tup and fell. He had recent knee surgery in May  Does not recall hitting the spine, but his shoulder Xrays done showed Acute ant wedge at L3  Takes norco 5-325mg  as needed for pain for knee and back  Using salonpasa lidocaine ointment   Belching more and feels acid coming up. Started last week, was on protonix in the past . No abd pain  No change in bowels   Needs refill on NTG   Review Of Systems:  GEN- denies fatigue, fever, weight loss,weakness, recent illness HEENT- denies eye drainage, change in vision, nasal discharge, CVS- denies chest pain, palpitations RESP- denies SOB, cough, wheeze ABD- denies N/V, change in stools, +abd pain GU- denies dysuria, hematuria, dribbling, incontinence MSK-+ joint pain, muscle aches, injury Neuro- denies headache, dizziness, syncope, seizure activity       Objective:    BP 138/82   Pulse 76   Temp 98.8 F (37.1 C) (Oral)   Resp 14   Ht 6' (1.829 m)   Wt 195 lb (88.5 kg)   SpO2 99%   BMI 26.45 kg/m  GEN- NAD, alert and oriented x3 HEENT- PERRL, EOMI, non injected sclera, pink conjunctiva, MMM, oropharynx clear CVS- RRR, no murmur RESP-CTAB ABD-NABS,soft,NT,ND MSK- Spine mild TTP lumbar spine, decreased ROM, neg SLR,  EXT- No edema Pulses- Radial, DP- 2+        Assessment & Plan:   Pain meds refilled   Problem List Items Addressed This Visit      Unprioritized   Compression fracture of L3 lumbar vertebra, with routine healing, subsequent encounter    He prefers to try oral pain management and no other intervention at this time Given handout on compression fractures Had old MRI showing the old T12 fracture  Able to ambulate at baesline since 2nd knee surgery No new paresthesia in lower ext      Gallstones    Discussed how this may present I do not think current symptoms are gallbladder related      GERD (gastroesophageal reflux disease) - Primary    Restart protonix      Relevant Medications   pantoprazole (PROTONIX) 40 MG tablet      Note: This dictation was prepared with Dragon dictation along with smaller phrase technology. Any transcriptional errors that result from this process are unintentional.

## 2019-05-30 NOTE — Assessment & Plan Note (Signed)
He prefers to try oral pain management and no other intervention at this time Given handout on compression fractures Had old MRI showing the old T12 fracture  Able to ambulate at baesline since 2nd knee surgery No new paresthesia in lower ext

## 2019-05-30 NOTE — Assessment & Plan Note (Signed)
Discussed how this may present I do not think current symptoms are gallbladder related

## 2019-05-30 NOTE — Assessment & Plan Note (Signed)
Restart protonix

## 2019-05-31 ENCOUNTER — Other Ambulatory Visit: Payer: Self-pay | Admitting: *Deleted

## 2019-05-31 MED ORDER — NITROGLYCERIN 0.4 MG SL SUBL: 0.4000 mg | SUBLINGUAL_TABLET | SUBLINGUAL | 4 refills | Status: DC | PRN

## 2019-06-07 DIAGNOSIS — M545 Low back pain: Secondary | ICD-10-CM | POA: Diagnosis not present

## 2019-06-07 DIAGNOSIS — S32030A Wedge compression fracture of third lumbar vertebra, initial encounter for closed fracture: Secondary | ICD-10-CM | POA: Diagnosis not present

## 2019-06-14 DIAGNOSIS — S32030A Wedge compression fracture of third lumbar vertebra, initial encounter for closed fracture: Secondary | ICD-10-CM | POA: Diagnosis not present

## 2019-06-18 DIAGNOSIS — S32030A Wedge compression fracture of third lumbar vertebra, initial encounter for closed fracture: Secondary | ICD-10-CM | POA: Diagnosis not present

## 2019-06-20 ENCOUNTER — Other Ambulatory Visit: Payer: Self-pay

## 2019-06-20 ENCOUNTER — Ambulatory Visit (INDEPENDENT_AMBULATORY_CARE_PROVIDER_SITE_OTHER): Payer: Medicare HMO | Admitting: Family Medicine

## 2019-06-20 ENCOUNTER — Encounter: Payer: Self-pay | Admitting: Family Medicine

## 2019-06-20 VITALS — BP 130/68 | HR 96 | Temp 99.1°F | Resp 14 | Ht 72.0 in | Wt 187.0 lb

## 2019-06-20 DIAGNOSIS — N183 Chronic kidney disease, stage 3 unspecified: Secondary | ICD-10-CM

## 2019-06-20 DIAGNOSIS — R4182 Altered mental status, unspecified: Secondary | ICD-10-CM

## 2019-06-20 DIAGNOSIS — I2510 Atherosclerotic heart disease of native coronary artery without angina pectoris: Secondary | ICD-10-CM | POA: Diagnosis not present

## 2019-06-20 DIAGNOSIS — G4701 Insomnia due to medical condition: Secondary | ICD-10-CM | POA: Diagnosis not present

## 2019-06-20 LAB — URINALYSIS, ROUTINE W REFLEX MICROSCOPIC
Bilirubin Urine: NEGATIVE
Glucose, UA: NEGATIVE
Hgb urine dipstick: NEGATIVE
Ketones, ur: NEGATIVE
Leukocytes,Ua: NEGATIVE
Nitrite: NEGATIVE
Protein, ur: NEGATIVE
Specific Gravity, Urine: 1.02 (ref 1.001–1.03)
pH: 6.5 (ref 5.0–8.0)

## 2019-06-20 MED ORDER — TRAZODONE HCL 50 MG PO TABS: 25.0000 mg | ORAL_TABLET | Freq: Every evening | ORAL | 3 refills | Status: DC | PRN

## 2019-06-20 NOTE — Patient Instructions (Addendum)
No more than 2 pain pills in a day Do not take pain medication at bedtime Take trazodone 25mg  between 9-10pm F/U 2  WEEKS For recheck - Telephone visit
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
## 2019-06-21 ENCOUNTER — Encounter: Payer: Self-pay | Admitting: Family Medicine

## 2019-06-21 DIAGNOSIS — N5082 Scrotal pain: Secondary | ICD-10-CM | POA: Diagnosis not present

## 2019-06-21 LAB — CBC WITH DIFFERENTIAL/PLATELET
Absolute Monocytes: 779 cells/uL (ref 200–950)
Basophils Absolute: 57 cells/uL (ref 0–200)
Basophils Relative: 0.7 %
Eosinophils Absolute: 107 cells/uL (ref 15–500)
Eosinophils Relative: 1.3 %
HCT: 41.7 % (ref 38.5–50.0)
Hemoglobin: 13.6 g/dL (ref 13.2–17.1)
Lymphs Abs: 1189 cells/uL (ref 850–3900)
MCH: 30 pg (ref 27.0–33.0)
MCHC: 32.6 g/dL (ref 32.0–36.0)
MCV: 91.9 fL (ref 80.0–100.0)
MPV: 11.1 fL (ref 7.5–12.5)
Monocytes Relative: 9.5 %
Neutro Abs: 6068 cells/uL (ref 1500–7800)
Neutrophils Relative %: 74 %
Platelets: 209 10*3/uL (ref 140–400)
RBC: 4.54 10*6/uL (ref 4.20–5.80)
RDW: 13.7 % (ref 11.0–15.0)
Total Lymphocyte: 14.5 %
WBC: 8.2 10*3/uL (ref 3.8–10.8)

## 2019-06-21 LAB — COMPREHENSIVE METABOLIC PANEL
AG Ratio: 1.6 (calc) (ref 1.0–2.5)
ALT: 11 U/L (ref 9–46)
AST: 18 U/L (ref 10–35)
Albumin: 4.2 g/dL (ref 3.6–5.1)
Alkaline phosphatase (APISO): 123 U/L (ref 35–144)
BUN/Creatinine Ratio: 16 (calc) (ref 6–22)
BUN: 21 mg/dL (ref 7–25)
CO2: 28 mmol/L (ref 20–32)
Calcium: 9.5 mg/dL (ref 8.6–10.3)
Chloride: 98 mmol/L (ref 98–110)
Creat: 1.34 mg/dL — ABNORMAL HIGH (ref 0.70–1.18)
Globulin: 2.7 g/dL (calc) (ref 1.9–3.7)
Glucose, Bld: 98 mg/dL (ref 65–99)
Potassium: 4.4 mmol/L (ref 3.5–5.3)
Sodium: 138 mmol/L (ref 135–146)
Total Bilirubin: 0.7 mg/dL (ref 0.2–1.2)
Total Protein: 6.9 g/dL (ref 6.1–8.1)

## 2019-06-21 LAB — TSH: TSH: 2.67 mIU/L (ref 0.40–4.50)

## 2019-06-21 NOTE — Assessment & Plan Note (Signed)
BP controlled, no chest pain , has CKD which he worries about as well  Will check labs and Hb from recent surgery  UA negative for AMS , no other sign of infection  He doesn't seem altered but fatigued, poor sleep, he also admits he has felt depressed with all the pain. At his age this with fatigue can cause more memory deficits short term Will try trazodone 25mg  at bedtime for sleep Will try to reduce use of pain pill at bedtime to help with sleep which he had been doing some nights

## 2019-07-02 DIAGNOSIS — M25661 Stiffness of right knee, not elsewhere classified: Secondary | ICD-10-CM | POA: Diagnosis not present

## 2019-07-02 DIAGNOSIS — R262 Difficulty in walking, not elsewhere classified: Secondary | ICD-10-CM | POA: Diagnosis not present

## 2019-07-02 DIAGNOSIS — M6281 Muscle weakness (generalized): Secondary | ICD-10-CM | POA: Diagnosis not present

## 2019-07-02 DIAGNOSIS — M25461 Effusion, right knee: Secondary | ICD-10-CM | POA: Diagnosis not present

## 2019-07-02 DIAGNOSIS — M25561 Pain in right knee: Secondary | ICD-10-CM | POA: Diagnosis not present

## 2019-07-04 ENCOUNTER — Other Ambulatory Visit: Payer: Self-pay

## 2019-07-04 ENCOUNTER — Encounter: Payer: Self-pay | Admitting: Family Medicine

## 2019-07-04 ENCOUNTER — Ambulatory Visit (INDEPENDENT_AMBULATORY_CARE_PROVIDER_SITE_OTHER): Payer: Medicare HMO | Admitting: Family Medicine

## 2019-07-04 DIAGNOSIS — G4701 Insomnia due to medical condition: Secondary | ICD-10-CM

## 2019-07-04 DIAGNOSIS — G4700 Insomnia, unspecified: Secondary | ICD-10-CM | POA: Insufficient documentation

## 2019-07-04 NOTE — Progress Notes (Signed)
Virtual Visit via Telephone Note  I connected with [name] [name] on 07/04/19 at 10:31am by telephone and verified that I am speaking with the correct person using two identifiers.       Pt location: at home   Physician location:  In office, [facility], Vic Blackbird MD     On call: patient and physician   I discussed the limitations, risks, security and privacy concerns of performing an evaluation and management service by telephone and the availability of in person appointments. I also discussed with the patient that there may be a patient responsible charge related to this service. The patient expressed understanding and agreed to proceed.   History of Present Illness:  Telephone visit to f/u Trazodone. He does not take every night, but when he does take it helps, taking full dose of trazodone   still having a lot pain in his knee and his back.  He was seen by his orthopedic surgeon they are putting him back in physical therapy.  He states that he still fatigue during the day he has not had any other episodes where he has been very confused or altered that his wife is noted.  He does continue to state however that he slept better he would feel better but he does not want to take the trazodone every night at this time.  He is taking all his other medications as prescribed he is trying to also cut back on his pain medication to not overdo this.  He does not have any new concerns otherwise     Observations/Objective: NAD noted over phone, normal speech   Assessment and Plan: Insomnia/fatigue- he can continue trazodone as needed, he does not want to take ever day , once he gets better pain control that his sleep will also improve.  No further episodes of confusion   Follow Up Instructions:    I discussed the assessment and treatment plan with the patient. The patient was provided an opportunity to ask questions and all were answered. The patient agreed with the plan and  demonstrated an understanding of the instructions.   The patient was advised to call back or seek an in-person evaluation if the symptoms worsen or if the condition fails to improve as anticipated.  I provided  6  minutes of non-face-to-face time during this encounter. End TIME: 10:37am   Vic Blackbird, MD

## 2019-07-05 DIAGNOSIS — M25461 Effusion, right knee: Secondary | ICD-10-CM | POA: Diagnosis not present

## 2019-07-05 DIAGNOSIS — M25661 Stiffness of right knee, not elsewhere classified: Secondary | ICD-10-CM | POA: Diagnosis not present

## 2019-07-05 DIAGNOSIS — M6281 Muscle weakness (generalized): Secondary | ICD-10-CM | POA: Diagnosis not present

## 2019-07-05 DIAGNOSIS — M25561 Pain in right knee: Secondary | ICD-10-CM | POA: Diagnosis not present

## 2019-07-05 DIAGNOSIS — R262 Difficulty in walking, not elsewhere classified: Secondary | ICD-10-CM | POA: Diagnosis not present

## 2019-07-09 DIAGNOSIS — M25461 Effusion, right knee: Secondary | ICD-10-CM | POA: Diagnosis not present

## 2019-07-09 DIAGNOSIS — M25661 Stiffness of right knee, not elsewhere classified: Secondary | ICD-10-CM | POA: Diagnosis not present

## 2019-07-09 DIAGNOSIS — R262 Difficulty in walking, not elsewhere classified: Secondary | ICD-10-CM | POA: Diagnosis not present

## 2019-07-09 DIAGNOSIS — M25561 Pain in right knee: Secondary | ICD-10-CM | POA: Diagnosis not present

## 2019-07-09 DIAGNOSIS — M6281 Muscle weakness (generalized): Secondary | ICD-10-CM | POA: Diagnosis not present

## 2019-07-12 DIAGNOSIS — M25661 Stiffness of right knee, not elsewhere classified: Secondary | ICD-10-CM | POA: Diagnosis not present

## 2019-07-12 DIAGNOSIS — M6281 Muscle weakness (generalized): Secondary | ICD-10-CM | POA: Diagnosis not present

## 2019-07-12 DIAGNOSIS — M25561 Pain in right knee: Secondary | ICD-10-CM | POA: Diagnosis not present

## 2019-07-12 DIAGNOSIS — M25461 Effusion, right knee: Secondary | ICD-10-CM | POA: Diagnosis not present

## 2019-07-12 DIAGNOSIS — R262 Difficulty in walking, not elsewhere classified: Secondary | ICD-10-CM | POA: Diagnosis not present

## 2019-07-16 DIAGNOSIS — D631 Anemia in chronic kidney disease: Secondary | ICD-10-CM | POA: Diagnosis not present

## 2019-07-16 DIAGNOSIS — N189 Chronic kidney disease, unspecified: Secondary | ICD-10-CM | POA: Diagnosis not present

## 2019-07-16 DIAGNOSIS — N2581 Secondary hyperparathyroidism of renal origin: Secondary | ICD-10-CM | POA: Diagnosis not present

## 2019-07-16 DIAGNOSIS — N183 Chronic kidney disease, stage 3 (moderate): Secondary | ICD-10-CM | POA: Diagnosis not present

## 2019-07-16 DIAGNOSIS — I129 Hypertensive chronic kidney disease with stage 1 through stage 4 chronic kidney disease, or unspecified chronic kidney disease: Secondary | ICD-10-CM | POA: Diagnosis not present

## 2019-07-17 DIAGNOSIS — M25461 Effusion, right knee: Secondary | ICD-10-CM | POA: Diagnosis not present

## 2019-07-17 DIAGNOSIS — M25661 Stiffness of right knee, not elsewhere classified: Secondary | ICD-10-CM | POA: Diagnosis not present

## 2019-07-17 DIAGNOSIS — M6281 Muscle weakness (generalized): Secondary | ICD-10-CM | POA: Diagnosis not present

## 2019-07-17 DIAGNOSIS — M25561 Pain in right knee: Secondary | ICD-10-CM | POA: Diagnosis not present

## 2019-07-17 DIAGNOSIS — R262 Difficulty in walking, not elsewhere classified: Secondary | ICD-10-CM | POA: Diagnosis not present

## 2019-07-20 DIAGNOSIS — R262 Difficulty in walking, not elsewhere classified: Secondary | ICD-10-CM | POA: Diagnosis not present

## 2019-07-20 DIAGNOSIS — M6281 Muscle weakness (generalized): Secondary | ICD-10-CM | POA: Diagnosis not present

## 2019-07-20 DIAGNOSIS — M25461 Effusion, right knee: Secondary | ICD-10-CM | POA: Diagnosis not present

## 2019-07-20 DIAGNOSIS — M25561 Pain in right knee: Secondary | ICD-10-CM | POA: Diagnosis not present

## 2019-07-20 DIAGNOSIS — M25661 Stiffness of right knee, not elsewhere classified: Secondary | ICD-10-CM | POA: Diagnosis not present

## 2019-07-24 ENCOUNTER — Encounter (HOSPITAL_COMMUNITY): Payer: Self-pay

## 2019-07-24 ENCOUNTER — Emergency Department (HOSPITAL_COMMUNITY): Payer: Medicare HMO

## 2019-07-24 ENCOUNTER — Other Ambulatory Visit: Payer: Self-pay

## 2019-07-24 ENCOUNTER — Observation Stay (HOSPITAL_COMMUNITY)
Admission: EM | Admit: 2019-07-24 | Discharge: 2019-07-27 | Disposition: A | Discharge status: Home / Self Care | Payer: Medicare HMO | Attending: Internal Medicine | Admitting: Internal Medicine

## 2019-07-24 DIAGNOSIS — N281 Cyst of kidney, acquired: Secondary | ICD-10-CM | POA: Diagnosis not present

## 2019-07-24 DIAGNOSIS — I4891 Unspecified atrial fibrillation: Secondary | ICD-10-CM

## 2019-07-24 DIAGNOSIS — Z7982 Long term (current) use of aspirin: Secondary | ICD-10-CM | POA: Diagnosis not present

## 2019-07-24 DIAGNOSIS — Z87891 Personal history of nicotine dependence: Secondary | ICD-10-CM | POA: Diagnosis not present

## 2019-07-24 DIAGNOSIS — I4821 Permanent atrial fibrillation: Secondary | ICD-10-CM | POA: Diagnosis not present

## 2019-07-24 DIAGNOSIS — Z96653 Presence of artificial knee joint, bilateral: Secondary | ICD-10-CM | POA: Insufficient documentation

## 2019-07-24 DIAGNOSIS — S2232XA Fracture of one rib, left side, initial encounter for closed fracture: Secondary | ICD-10-CM

## 2019-07-24 DIAGNOSIS — K8020 Calculus of gallbladder without cholecystitis without obstruction: Secondary | ICD-10-CM | POA: Insufficient documentation

## 2019-07-24 DIAGNOSIS — S3991XA Unspecified injury of abdomen, initial encounter: Secondary | ICD-10-CM | POA: Diagnosis not present

## 2019-07-24 DIAGNOSIS — M19012 Primary osteoarthritis, left shoulder: Secondary | ICD-10-CM | POA: Insufficient documentation

## 2019-07-24 DIAGNOSIS — R0789 Other chest pain: Principal | ICD-10-CM | POA: Insufficient documentation

## 2019-07-24 DIAGNOSIS — R079 Chest pain, unspecified: Secondary | ICD-10-CM | POA: Diagnosis present

## 2019-07-24 DIAGNOSIS — N183 Chronic kidney disease, stage 3 unspecified: Secondary | ICD-10-CM | POA: Diagnosis present

## 2019-07-24 DIAGNOSIS — R0602 Shortness of breath: Secondary | ICD-10-CM | POA: Diagnosis not present

## 2019-07-24 DIAGNOSIS — R1012 Left upper quadrant pain: Secondary | ICD-10-CM | POA: Diagnosis not present

## 2019-07-24 DIAGNOSIS — Z20828 Contact with and (suspected) exposure to other viral communicable diseases: Secondary | ICD-10-CM | POA: Insufficient documentation

## 2019-07-24 DIAGNOSIS — J90 Pleural effusion, not elsewhere classified: Secondary | ICD-10-CM | POA: Diagnosis not present

## 2019-07-24 DIAGNOSIS — M6281 Muscle weakness (generalized): Secondary | ICD-10-CM | POA: Insufficient documentation

## 2019-07-24 DIAGNOSIS — M549 Dorsalgia, unspecified: Secondary | ICD-10-CM | POA: Insufficient documentation

## 2019-07-24 DIAGNOSIS — Y939 Activity, unspecified: Secondary | ICD-10-CM | POA: Insufficient documentation

## 2019-07-24 DIAGNOSIS — J9601 Acute respiratory failure with hypoxia: Secondary | ICD-10-CM | POA: Insufficient documentation

## 2019-07-24 DIAGNOSIS — M4856XA Collapsed vertebra, not elsewhere classified, lumbar region, initial encounter for fracture: Secondary | ICD-10-CM | POA: Diagnosis not present

## 2019-07-24 DIAGNOSIS — Z955 Presence of coronary angioplasty implant and graft: Secondary | ICD-10-CM | POA: Insufficient documentation

## 2019-07-24 DIAGNOSIS — R2689 Other abnormalities of gait and mobility: Secondary | ICD-10-CM | POA: Insufficient documentation

## 2019-07-24 DIAGNOSIS — R030 Elevated blood-pressure reading, without diagnosis of hypertension: Secondary | ICD-10-CM | POA: Diagnosis not present

## 2019-07-24 DIAGNOSIS — S2242XA Multiple fractures of ribs, left side, initial encounter for closed fracture: Secondary | ICD-10-CM | POA: Diagnosis not present

## 2019-07-24 DIAGNOSIS — I2510 Atherosclerotic heart disease of native coronary artery without angina pectoris: Secondary | ICD-10-CM | POA: Insufficient documentation

## 2019-07-24 DIAGNOSIS — S2232XG Fracture of one rib, left side, subsequent encounter for fracture with delayed healing: Secondary | ICD-10-CM

## 2019-07-24 DIAGNOSIS — Z79899 Other long term (current) drug therapy: Secondary | ICD-10-CM | POA: Insufficient documentation

## 2019-07-24 DIAGNOSIS — R0600 Dyspnea, unspecified: Secondary | ICD-10-CM

## 2019-07-24 DIAGNOSIS — I252 Old myocardial infarction: Secondary | ICD-10-CM | POA: Insufficient documentation

## 2019-07-24 DIAGNOSIS — R0781 Pleurodynia: Secondary | ICD-10-CM | POA: Diagnosis not present

## 2019-07-24 DIAGNOSIS — Y929 Unspecified place or not applicable: Secondary | ICD-10-CM | POA: Insufficient documentation

## 2019-07-24 DIAGNOSIS — Y999 Unspecified external cause status: Secondary | ICD-10-CM | POA: Insufficient documentation

## 2019-07-24 DIAGNOSIS — R2681 Unsteadiness on feet: Secondary | ICD-10-CM | POA: Insufficient documentation

## 2019-07-24 LAB — CBC
HCT: 41.5 % (ref 39.0–52.0)
Hemoglobin: 12.9 g/dL — ABNORMAL LOW (ref 13.0–17.0)
MCH: 29.4 pg (ref 26.0–34.0)
MCHC: 31.1 g/dL (ref 30.0–36.0)
MCV: 94.5 fL (ref 80.0–100.0)
Platelets: 181 10*3/uL (ref 150–400)
RBC: 4.39 MIL/uL (ref 4.22–5.81)
RDW: 14.3 % (ref 11.5–15.5)
WBC: 11.1 10*3/uL — ABNORMAL HIGH (ref 4.0–10.5)
nRBC: 0 % (ref 0.0–0.2)

## 2019-07-24 LAB — COMPREHENSIVE METABOLIC PANEL
ALT: 22 U/L (ref 0–44)
AST: 41 U/L (ref 15–41)
Albumin: 4.1 g/dL (ref 3.5–5.0)
Alkaline Phosphatase: 68 U/L (ref 38–126)
Anion gap: 11 (ref 5–15)
BUN: 20 mg/dL (ref 8–23)
CO2: 26 mmol/L (ref 22–32)
Calcium: 8.7 mg/dL — ABNORMAL LOW (ref 8.9–10.3)
Chloride: 101 mmol/L (ref 98–111)
Creatinine, Ser: 1.29 mg/dL — ABNORMAL HIGH (ref 0.61–1.24)
GFR calc Af Amer: >60 mL/min (ref >60)
GFR calc non Af Amer: 52 mL/min — ABNORMAL LOW (ref >60)
Glucose, Bld: 109 mg/dL — ABNORMAL HIGH (ref 70–99)
Potassium: 3.9 mmol/L (ref 3.5–5.1)
Sodium: 138 mmol/L (ref 135–145)
Total Bilirubin: 1.3 mg/dL — ABNORMAL HIGH (ref 0.3–1.2)
Total Protein: 6.9 g/dL (ref 6.5–8.1)

## 2019-07-24 MED ORDER — ONDANSETRON HCL 4 MG/2ML IJ SOLN: 4.0000 mg | Freq: Four times a day (QID) | INTRAMUSCULAR | Status: DC | PRN

## 2019-07-24 MED ORDER — ENOXAPARIN SODIUM 40 MG/0.4ML ~~LOC~~ SOLN
40.0000 mg | SUBCUTANEOUS | Status: DC
  Administered 2019-07-24 – 2019-07-26 (×3): 40 mg via SUBCUTANEOUS
  Filled 2019-07-24 (×3): qty 0.4

## 2019-07-24 MED ORDER — ONDANSETRON HCL 4 MG/2ML IJ SOLN
4.0000 mg | Freq: Once | INTRAMUSCULAR | Status: AC
  Administered 2019-07-24: 4 mg via INTRAVENOUS
  Filled 2019-07-24: qty 2

## 2019-07-24 MED ORDER — MORPHINE SULFATE (PF) 4 MG/ML IV SOLN
4.0000 mg | Freq: Once | INTRAVENOUS | Status: AC
  Administered 2019-07-24: 4 mg via INTRAVENOUS
  Filled 2019-07-24: qty 1

## 2019-07-24 MED ORDER — ATORVASTATIN CALCIUM 40 MG PO TABS
40.0000 mg | ORAL_TABLET | Freq: Every evening | ORAL | Status: DC
  Administered 2019-07-24 – 2019-07-26 (×3): 40 mg via ORAL
  Filled 2019-07-24 (×3): qty 1

## 2019-07-24 MED ORDER — NITROGLYCERIN 0.4 MG SL SUBL: 0.4000 mg | SUBLINGUAL_TABLET | SUBLINGUAL | Status: DC | PRN

## 2019-07-24 MED ORDER — MORPHINE SULFATE (PF) 2 MG/ML IV SOLN: 2.0000 mg | INTRAVENOUS | Status: DC | PRN

## 2019-07-24 MED ORDER — HYDROCODONE-ACETAMINOPHEN 5-325 MG PO TABS
1.0000 | ORAL_TABLET | ORAL | Status: DC | PRN
  Administered 2019-07-25 – 2019-07-27 (×6): 2 via ORAL
  Filled 2019-07-24 (×6): qty 2

## 2019-07-24 MED ORDER — CALCITRIOL 0.25 MCG PO CAPS
0.5000 ug | ORAL_CAPSULE | Freq: Every day | ORAL | Status: DC
  Administered 2019-07-25 – 2019-07-27 (×3): 0.5 ug via ORAL
  Filled 2019-07-24 (×3): qty 2

## 2019-07-24 MED ORDER — PANTOPRAZOLE SODIUM 40 MG PO TBEC
40.0000 mg | DELAYED_RELEASE_TABLET | Freq: Every day | ORAL | Status: DC
  Administered 2019-07-25 – 2019-07-27 (×3): 40 mg via ORAL
  Filled 2019-07-24 (×3): qty 1

## 2019-07-24 MED ORDER — ONDANSETRON HCL 4 MG PO TABS: 4.0000 mg | ORAL_TABLET | Freq: Four times a day (QID) | ORAL | Status: DC | PRN

## 2019-07-24 MED ORDER — POLYETHYLENE GLYCOL 3350 17 G PO PACK
17.0000 g | PACK | Freq: Two times a day (BID) | ORAL | Status: DC
  Administered 2019-07-24 – 2019-07-27 (×5): 17 g via ORAL
  Filled 2019-07-24 (×6): qty 1

## 2019-07-24 MED ORDER — DOCUSATE SODIUM 100 MG PO CAPS
100.0000 mg | ORAL_CAPSULE | Freq: Two times a day (BID) | ORAL | Status: DC
  Administered 2019-07-24 – 2019-07-27 (×6): 100 mg via ORAL
  Filled 2019-07-24 (×6): qty 1

## 2019-07-24 MED ORDER — FUROSEMIDE 40 MG PO TABS
40.0000 mg | ORAL_TABLET | Freq: Every day | ORAL | Status: DC
  Administered 2019-07-25 – 2019-07-27 (×3): 40 mg via ORAL
  Filled 2019-07-24 (×3): qty 1

## 2019-07-24 MED ORDER — TRAZODONE HCL 50 MG PO TABS
25.0000 mg | ORAL_TABLET | Freq: Every evening | ORAL | Status: DC | PRN
  Filled 2019-07-24: qty 1

## 2019-07-24 MED ORDER — VITAMIN B-12 1000 MCG PO TABS
500.0000 ug | ORAL_TABLET | Freq: Every day | ORAL | Status: DC
  Administered 2019-07-25 – 2019-07-27 (×3): 500 ug via ORAL
  Filled 2019-07-24 (×3): qty 1

## 2019-07-24 MED ORDER — ACETAMINOPHEN 650 MG RE SUPP: 650.0000 mg | Freq: Four times a day (QID) | RECTAL | Status: DC | PRN

## 2019-07-24 MED ORDER — HYDROMORPHONE HCL 1 MG/ML IJ SOLN
1.0000 mg | Freq: Once | INTRAMUSCULAR | Status: AC
  Administered 2019-07-24: 1 mg via INTRAVENOUS
  Filled 2019-07-24: qty 1

## 2019-07-24 MED ORDER — FERROUS SULFATE 325 (65 FE) MG PO TABS
325.0000 mg | ORAL_TABLET | Freq: Three times a day (TID) | ORAL | Status: DC
  Administered 2019-07-25 (×2): 325 mg via ORAL
  Filled 2019-07-24 (×4): qty 1

## 2019-07-24 MED ORDER — ACETAMINOPHEN 325 MG PO TABS: 650.0000 mg | ORAL_TABLET | Freq: Four times a day (QID) | ORAL | Status: DC | PRN

## 2019-07-24 NOTE — ED Notes (Signed)
Resp called for I.S.
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
## 2019-07-24 NOTE — ED Notes (Signed)
Patient was unable to walk and keep O2 sat levels above 90. O2 sat levels dropped to 88-89. Patient also had difficulty standing
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
## 2019-07-24 NOTE — ED Notes (Signed)
Pt given meal per PA approval
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
## 2019-07-25 ENCOUNTER — Other Ambulatory Visit: Payer: Self-pay

## 2019-07-25 ENCOUNTER — Ambulatory Visit: Payer: Medicare HMO | Admitting: Cardiology

## 2019-07-25 DIAGNOSIS — I4891 Unspecified atrial fibrillation: Secondary | ICD-10-CM

## 2019-07-25 DIAGNOSIS — S2232XG Fracture of one rib, left side, subsequent encounter for fracture with delayed healing: Secondary | ICD-10-CM

## 2019-07-25 DIAGNOSIS — I4821 Permanent atrial fibrillation: Secondary | ICD-10-CM | POA: Diagnosis not present

## 2019-07-25 DIAGNOSIS — S2242XG Multiple fractures of ribs, left side, subsequent encounter for fracture with delayed healing: Secondary | ICD-10-CM | POA: Diagnosis not present

## 2019-07-25 DIAGNOSIS — N183 Chronic kidney disease, stage 3 (moderate): Secondary | ICD-10-CM | POA: Diagnosis not present

## 2019-07-25 DIAGNOSIS — J9601 Acute respiratory failure with hypoxia: Secondary | ICD-10-CM | POA: Diagnosis not present

## 2019-07-25 HISTORY — DX: Fracture of one rib, left side, subsequent encounter for fracture with delayed healing: S22.32XG

## 2019-07-25 LAB — COMPREHENSIVE METABOLIC PANEL
ALT: 19 U/L (ref 0–44)
AST: 41 U/L (ref 15–41)
Albumin: 3.5 g/dL (ref 3.5–5.0)
Alkaline Phosphatase: 53 U/L (ref 38–126)
Anion gap: 8 (ref 5–15)
BUN: 21 mg/dL (ref 8–23)
CO2: 27 mmol/L (ref 22–32)
Calcium: 8.3 mg/dL — ABNORMAL LOW (ref 8.9–10.3)
Chloride: 101 mmol/L (ref 98–111)
Creatinine, Ser: 1.23 mg/dL (ref 0.61–1.24)
GFR calc Af Amer: >60 mL/min (ref >60)
GFR calc non Af Amer: 55 mL/min — ABNORMAL LOW (ref >60)
Glucose, Bld: 118 mg/dL — ABNORMAL HIGH (ref 70–99)
Potassium: 4 mmol/L (ref 3.5–5.1)
Sodium: 136 mmol/L (ref 135–145)
Total Bilirubin: 1.6 mg/dL — ABNORMAL HIGH (ref 0.3–1.2)
Total Protein: 6.1 g/dL — ABNORMAL LOW (ref 6.5–8.1)

## 2019-07-25 LAB — CBC
HCT: 36.3 % — ABNORMAL LOW (ref 39.0–52.0)
Hemoglobin: 11.4 g/dL — ABNORMAL LOW (ref 13.0–17.0)
MCH: 29.9 pg (ref 26.0–34.0)
MCHC: 31.4 g/dL (ref 30.0–36.0)
MCV: 95.3 fL (ref 80.0–100.0)
Platelets: 168 10*3/uL (ref 150–400)
RBC: 3.81 MIL/uL — ABNORMAL LOW (ref 4.22–5.81)
RDW: 14.4 % (ref 11.5–15.5)
WBC: 9.3 10*3/uL (ref 4.0–10.5)
nRBC: 0 % (ref 0.0–0.2)

## 2019-07-25 LAB — SARS CORONAVIRUS 2 (TAT 6-24 HRS): SARS Coronavirus 2: NEGATIVE

## 2019-07-25 NOTE — Care Management Obs Status (Signed)
[place] NOTIFICATION   Patient Details  Name: [name] [name] MRN: [id number] Date of Birth: 1941-05-24   Medicare Observation Status Notification Given:  Yes Letter was given, patient refused to sign   [name] 07/25/2019, 4:11 PM
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
## 2019-07-25 NOTE — Plan of Care (Signed)
  Problem: Acute Rehab PT Goals(only PT should resolve) Goal: Pt Will Go Supine/Side To Sit Outcome: Progressing Flowsheets (Taken 07/25/2019 1608) Pt will go Supine/Side to Sit: with modified independence Note: With bed flat Goal: Patient Will Transfer Sit To/From Stand Outcome: Progressing Flowsheets (Taken 07/25/2019 1608) Patient will transfer sit to/from stand: with supervision Goal: Pt Will Transfer Bed To Chair/Chair To Bed Outcome: Progressing Flowsheets (Taken 07/25/2019 1608) Pt will Transfer Bed to Chair/Chair to Bed: with supervision Goal: Pt Will Ambulate Outcome: Progressing Flowsheets (Taken 07/25/2019 1608) Pt will Ambulate:  75 feet  with supervision  with modified independence  with rolling walker  with cane   4:08 PM, 07/25/19 Lonell Grandchild, MPT Physical Therapist with [facility] [phone] office [phone] mobile phone
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
## 2019-07-25 NOTE — Clinical Social Work Note (Signed)
Patient was offered HHPT as a recommendation of PT evaluation. Patient is not agreeable to HHPT currently.  He declined HHPT services.    Sharol Croghan, Clydene Pugh, LCSW

## 2019-07-26 ENCOUNTER — Encounter (HOSPITAL_COMMUNITY): Payer: Self-pay | Admitting: Radiology

## 2019-07-26 ENCOUNTER — Observation Stay (HOSPITAL_COMMUNITY): Payer: Medicare HMO

## 2019-07-26 DIAGNOSIS — R030 Elevated blood-pressure reading, without diagnosis of hypertension: Secondary | ICD-10-CM | POA: Diagnosis not present

## 2019-07-26 DIAGNOSIS — Z7982 Long term (current) use of aspirin: Secondary | ICD-10-CM | POA: Diagnosis not present

## 2019-07-26 DIAGNOSIS — R1012 Left upper quadrant pain: Secondary | ICD-10-CM | POA: Diagnosis not present

## 2019-07-26 DIAGNOSIS — I4821 Permanent atrial fibrillation: Secondary | ICD-10-CM | POA: Diagnosis not present

## 2019-07-26 DIAGNOSIS — S2242XG Multiple fractures of ribs, left side, subsequent encounter for fracture with delayed healing: Secondary | ICD-10-CM | POA: Diagnosis not present

## 2019-07-26 DIAGNOSIS — S2242XA Multiple fractures of ribs, left side, initial encounter for closed fracture: Secondary | ICD-10-CM | POA: Diagnosis not present

## 2019-07-26 DIAGNOSIS — J9601 Acute respiratory failure with hypoxia: Secondary | ICD-10-CM | POA: Diagnosis not present

## 2019-07-26 DIAGNOSIS — I2510 Atherosclerotic heart disease of native coronary artery without angina pectoris: Secondary | ICD-10-CM | POA: Diagnosis not present

## 2019-07-26 DIAGNOSIS — N183 Chronic kidney disease, stage 3 (moderate): Secondary | ICD-10-CM | POA: Diagnosis not present

## 2019-07-26 DIAGNOSIS — J90 Pleural effusion, not elsewhere classified: Secondary | ICD-10-CM | POA: Diagnosis not present

## 2019-07-26 DIAGNOSIS — R0789 Other chest pain: Secondary | ICD-10-CM | POA: Diagnosis not present

## 2019-07-26 DIAGNOSIS — Z20828 Contact with and (suspected) exposure to other viral communicable diseases: Secondary | ICD-10-CM | POA: Diagnosis not present

## 2019-07-26 LAB — CBC WITH DIFFERENTIAL/PLATELET
Abs Immature Granulocytes: 0.04 10*3/uL (ref 0.00–0.07)
Basophils Absolute: 0 10*3/uL (ref 0.0–0.1)
Basophils Relative: 0 %
Eosinophils Absolute: 0.1 10*3/uL (ref 0.0–0.5)
Eosinophils Relative: 1 %
HCT: 36 % — ABNORMAL LOW (ref 39.0–52.0)
Hemoglobin: 11.7 g/dL — ABNORMAL LOW (ref 13.0–17.0)
Immature Granulocytes: 0 %
Lymphocytes Relative: 11 %
Lymphs Abs: 1 10*3/uL (ref 0.7–4.0)
MCH: 31.3 pg (ref 26.0–34.0)
MCHC: 32.5 g/dL (ref 30.0–36.0)
MCV: 96.3 fL (ref 80.0–100.0)
Monocytes Absolute: 1.2 10*3/uL — ABNORMAL HIGH (ref 0.1–1.0)
Monocytes Relative: 13 %
Neutro Abs: 6.7 10*3/uL (ref 1.7–7.7)
Neutrophils Relative %: 75 %
Platelets: 150 10*3/uL (ref 150–400)
RBC: 3.74 MIL/uL — ABNORMAL LOW (ref 4.22–5.81)
RDW: 14.2 % (ref 11.5–15.5)
WBC: 9 10*3/uL (ref 4.0–10.5)
nRBC: 0 % (ref 0.0–0.2)

## 2019-07-26 LAB — BASIC METABOLIC PANEL
Anion gap: 8 (ref 5–15)
BUN: 25 mg/dL — ABNORMAL HIGH (ref 8–23)
CO2: 26 mmol/L (ref 22–32)
Calcium: 8.2 mg/dL — ABNORMAL LOW (ref 8.9–10.3)
Chloride: 101 mmol/L (ref 98–111)
Creatinine, Ser: 1.38 mg/dL — ABNORMAL HIGH (ref 0.61–1.24)
GFR calc Af Amer: 56 mL/min — ABNORMAL LOW (ref >60)
GFR calc non Af Amer: 48 mL/min — ABNORMAL LOW (ref >60)
Glucose, Bld: 105 mg/dL — ABNORMAL HIGH (ref 70–99)
Potassium: 4 mmol/L (ref 3.5–5.1)
Sodium: 135 mmol/L (ref 135–145)

## 2019-07-26 LAB — BRAIN NATRIURETIC PEPTIDE: B Natriuretic Peptide: 351 pg/mL — ABNORMAL HIGH (ref 0.0–100.0)

## 2019-07-26 LAB — TROPONIN I (HIGH SENSITIVITY)
Troponin I (High Sensitivity): 19 ng/L — ABNORMAL HIGH (ref <18)
Troponin I (High Sensitivity): 20 ng/L — ABNORMAL HIGH (ref <18)

## 2019-07-26 LAB — MAGNESIUM: Magnesium: 2.1 mg/dL (ref 1.7–2.4)
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
## 2019-07-26 NOTE — Plan of Care (Signed)
  Problem: Education: Goal: Knowledge of General Education information will improve Description Including pain rating scale, medication(s)/side effects and non-pharmacologic comfort measures Outcome: Progressing   

## 2019-07-27 ENCOUNTER — Encounter (HOSPITAL_COMMUNITY): Payer: Self-pay | Admitting: Cardiology

## 2019-07-27 DIAGNOSIS — I4821 Permanent atrial fibrillation: Secondary | ICD-10-CM | POA: Diagnosis not present

## 2019-07-27 DIAGNOSIS — S2242XG Multiple fractures of ribs, left side, subsequent encounter for fracture with delayed healing: Secondary | ICD-10-CM

## 2019-07-27 DIAGNOSIS — J90 Pleural effusion, not elsewhere classified: Secondary | ICD-10-CM

## 2019-07-27 DIAGNOSIS — J9601 Acute respiratory failure with hypoxia: Secondary | ICD-10-CM | POA: Diagnosis not present

## 2019-07-27 DIAGNOSIS — N183 Chronic kidney disease, stage 3 (moderate): Secondary | ICD-10-CM | POA: Diagnosis not present

## 2019-07-27 DIAGNOSIS — R0602 Shortness of breath: Secondary | ICD-10-CM | POA: Diagnosis not present

## 2019-07-27 DIAGNOSIS — R079 Chest pain, unspecified: Secondary | ICD-10-CM

## 2019-07-27 DIAGNOSIS — I25118 Atherosclerotic heart disease of native coronary artery with other forms of angina pectoris: Secondary | ICD-10-CM | POA: Diagnosis not present

## 2019-07-27 LAB — BASIC METABOLIC PANEL
Anion gap: 9 (ref 5–15)
BUN: 23 mg/dL (ref 8–23)
CO2: 25 mmol/L (ref 22–32)
Calcium: 8.4 mg/dL — ABNORMAL LOW (ref 8.9–10.3)
Chloride: 100 mmol/L (ref 98–111)
Creatinine, Ser: 1.19 mg/dL (ref 0.61–1.24)
GFR calc Af Amer: >60 mL/min (ref >60)
GFR calc non Af Amer: 58 mL/min — ABNORMAL LOW (ref >60)
Glucose, Bld: 149 mg/dL — ABNORMAL HIGH (ref 70–99)
Potassium: 3.6 mmol/L (ref 3.5–5.1)
Sodium: 134 mmol/L — ABNORMAL LOW (ref 135–145)
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
## 2019-07-27 NOTE — Plan of Care (Signed)
  Problem: Education: Goal: Knowledge of General Education information will improve Description: Including pain rating scale, medication(s)/side effects and non-pharmacologic comfort measures 07/27/2019 1519 by Santa Lighter, RN Outcome: Adequate for Discharge 07/27/2019 1519 by Santa Lighter, RN Outcome: Adequate for Discharge 07/27/2019 0848 by Santa Lighter, RN Outcome: Progressing   Problem: Clinical Measurements: Goal: Ability to maintain clinical measurements within normal limits will improve 07/27/2019 1519 by Santa Lighter, RN Outcome: Adequate for Discharge 07/27/2019 1519 by Santa Lighter, RN Outcome: Adequate for Discharge 07/27/2019 0848 by Santa Lighter, RN Outcome: Progressing Goal: Will remain free from infection 07/27/2019 1519 by Santa Lighter, RN Outcome: Adequate for Discharge 07/27/2019 1519 by Santa Lighter, RN Outcome: Adequate for Discharge 07/27/2019 0848 by Santa Lighter, RN Outcome: Progressing Goal: Diagnostic test results will improve 07/27/2019 1519 by Santa Lighter, RN Outcome: Adequate for Discharge 07/27/2019 1519 by Santa Lighter, RN Outcome: Adequate for Discharge Goal: Respiratory complications will improve 07/27/2019 1519 by Santa Lighter, RN Outcome: Adequate for Discharge 07/27/2019 1519 by Santa Lighter, RN Outcome: Adequate for Discharge Goal: Cardiovascular complication will be avoided 07/27/2019 1519 by Santa Lighter, RN Outcome: Adequate for Discharge 07/27/2019 1519 by Santa Lighter, RN Outcome: Adequate for Discharge   Problem: Activity: Goal: Risk for activity intolerance will decrease 07/27/2019 1519 by Santa Lighter, RN Outcome: Adequate for Discharge 07/27/2019 1519 by Santa Lighter, RN Outcome: Adequate for Discharge   Problem: Nutrition: Goal: Adequate nutrition will be maintained 07/27/2019 1519 by Santa Lighter, RN Outcome: Adequate for Discharge 07/27/2019 1519 by Santa Lighter, RN Outcome: Adequate for  Discharge   Problem: Coping: Goal: Level of anxiety will decrease 07/27/2019 1519 by Santa Lighter, RN Outcome: Adequate for Discharge 07/27/2019 1519 by Santa Lighter, RN Outcome: Adequate for Discharge   Problem: Elimination: Goal: Will not experience complications related to bowel motility 07/27/2019 1519 by Santa Lighter, RN Outcome: Adequate for Discharge 07/27/2019 1519 by Santa Lighter, RN Outcome: Adequate for Discharge Goal: Will not experience complications related to urinary retention 07/27/2019 1519 by Santa Lighter, RN Outcome: Adequate for Discharge 07/27/2019 1519 by Santa Lighter, RN Outcome: Adequate for Discharge   Problem: Pain Managment: Goal: General experience of comfort will improve 07/27/2019 1519 by Santa Lighter, RN Outcome: Adequate for Discharge 07/27/2019 1519 by Santa Lighter, RN Outcome: Adequate for Discharge   Problem: Safety: Goal: Ability to remain free from injury will improve 07/27/2019 1519 by Santa Lighter, RN Outcome: Adequate for Discharge 07/27/2019 1519 by Santa Lighter, RN Outcome: Adequate for Discharge   Problem: Skin Integrity: Goal: Risk for impaired skin integrity will decrease 07/27/2019 1519 by Santa Lighter, RN Outcome: Adequate for Discharge 07/27/2019 1519 by Santa Lighter, RN Outcome: Adequate for Discharge

## 2019-07-27 NOTE — Progress Notes (Signed)
Nsg Discharge Note  Admit Date:  07/24/2019 Discharge date: 07/27/2019   [name] [name] to be D/C'd Home per MD order.  AVS completed.  Copy for chart, and copy for patient signed, and dated. Removed IV-clean, dry, intact. Reviewed d/c paperwork. Answered all questions. Josh wheeled stable patient and belongs to main entrance where he was picked up his wife. Patient/caregiver able to verbalize understanding.  Discharge Medication: Allergies as of 07/27/2019      Reactions   Contrast Media [iodinated Diagnostic Agents]    Contrast-induced nephropathy requiring dialysis in 02/2007.   Iodine-131 Other (See Comments)   Kidneys shut down   Other Other (See Comments)   Beta blockers cause bradycardia   Promethazine Other (See Comments)   Pt became drowsy and mild altered mental status      Medication List    TAKE these medications   atorvastatin 40 MG tablet Commonly known as: LIPITOR TAKE 1 TABLET (40 MG TOTAL) BY MOUTH EVERY EVENING.   calcitRIOL 0.5 MCG capsule Commonly known as: ROCALTROL Take 0.5 mcg by mouth daily.   docusate sodium 100 MG capsule Commonly known as: Colace Take 1 capsule (100 mg total) by mouth 2 (two) times daily.   Fish Oil 1200 MG Caps Take 1,200 mg by mouth daily.   furosemide 40 MG tablet Commonly known as: LASIX TAKE 1 TABLET EVERY DAY   HYDROcodone-acetaminophen 5-325 MG tablet Commonly known as: NORCO/VICODIN Take 1-2 tablets by mouth every 4 (four) hours as needed. What changed: reasons to take this   Niacin Flush Free 500 MG Caps Generic drug: Inositol Niacinate Take 500 mg by mouth daily.   nitroGLYCERIN 0.4 MG SL tablet Commonly known as: Nitrostat Place 1 tablet (0.4 mg total) under the tongue every 5 (five) minutes as needed for chest pain.   pantoprazole 40 MG tablet Commonly known as: PROTONIX Take 1 tablet (40 mg total) by mouth daily.   polyethylene glycol 17 g packet Commonly known as: MIRALAX / GLYCOLAX Take 17 g by mouth 2  (two) times daily.   traZODone 50 MG tablet Commonly known as: DESYREL Take 0.5-1 tablets (25-50 mg total) by mouth at bedtime as needed for sleep. What changed: when to take this   vitamin B-12 500 MCG tablet Commonly known as: CYANOCOBALAMIN Take 500 mcg by mouth daily.       Discharge Assessment: Vitals:   07/26/19 2105 07/27/19 0503  BP: 131/87 (!) 169/85  Pulse: 90 67  Resp: 16 16  Temp: 98.9 F (37.2 C) 98.4 F (36.9 C)  SpO2: 97% 95%   Skin clean, dry and intact without evidence of skin break down, no evidence of skin tears noted. IV catheter discontinued intact. Site without signs and symptoms of complications - no redness or edema noted at insertion site, patient denies c/o pain - only slight tenderness at site.  Dressing with slight pressure applied.  D/c Instructions-Education: Discharge instructions given to patient/family with verbalized understanding. D/c education completed with patient/family including follow up instructions, medication list, d/c activities limitations if indicated, with other d/c instructions as indicated by MD - patient able to verbalize understanding, all questions fully answered. Patient instructed to return to ED, call 911, or call MD for any changes in condition.  Patient escorted via [place], and D/C home via private auto.  Santa Lighter, RN 07/27/2019 4:04 PM

## 2019-07-27 NOTE — Discharge Summary (Signed)
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 1.6*  PROT 6.9 6.1*  ALBUMIN 4.1 3.5   No results for input(s): LIPASE, AMYLASE in the last 168 hours. No results for input(s): AMMONIA in the last 168 hours. CBC: Recent Labs  Lab 07/24/19 1606 07/25/19 0507 07/26/19 0506  WBC 11.1* 9.3 9.0  NEUTROABS  --   --  6.7  HGB 12.9* 11.4* 11.7*  HCT 41.5 36.3* 36.0*  MCV 94.5 95.3 96.3  PLT 181 168 150   Cardiac Enzymes: No results for input(s): CKTOTAL, CKMB, CKMBINDEX, TROPONINI in the last 168 hours. BNP: Invalid input(s): POCBNP CBG: No results for input(s): GLUCAP in the last 168 hours.  Time coordinating discharge:  36 minutes  Signed:  Orson Eva, DO [facility] Pager: [phone] 07/27/2019, 11:58 AM

## 2019-07-27 NOTE — Plan of Care (Signed)

## 2019-08-01 ENCOUNTER — Other Ambulatory Visit: Payer: Self-pay | Admitting: Family Medicine

## 2019-08-01 MED ORDER — HYDROCODONE-ACETAMINOPHEN 5-325 MG PO TABS: 1.0000 | ORAL_TABLET | ORAL | 0 refills | Status: DC | PRN

## 2019-08-01 NOTE — Telephone Encounter (Signed)
Patient calling to get refill on hydrocodone  [facility]  

## 2019-08-01 NOTE — Telephone Encounter (Signed)
Ok to refill??  Last office visit 07/04/2019.  Last refill 05/30/2019.

## 2019-08-23 ENCOUNTER — Ambulatory Visit (INDEPENDENT_AMBULATORY_CARE_PROVIDER_SITE_OTHER): Payer: Medicare HMO

## 2019-08-23 ENCOUNTER — Other Ambulatory Visit: Payer: Self-pay

## 2019-08-23 DIAGNOSIS — Z23 Encounter for immunization: Secondary | ICD-10-CM | POA: Diagnosis not present

## 2019-08-24 ENCOUNTER — Ambulatory Visit: Payer: Medicare HMO

## 2019-08-29 DIAGNOSIS — L218 Other seborrheic dermatitis: Secondary | ICD-10-CM | POA: Diagnosis not present

## 2019-08-29 DIAGNOSIS — D225 Melanocytic nevi of trunk: Secondary | ICD-10-CM | POA: Diagnosis not present

## 2019-08-29 DIAGNOSIS — D2271 Melanocytic nevi of right lower limb, including hip: Secondary | ICD-10-CM | POA: Diagnosis not present

## 2019-08-29 DIAGNOSIS — D2272 Melanocytic nevi of left lower limb, including hip: Secondary | ICD-10-CM | POA: Diagnosis not present

## 2019-08-29 DIAGNOSIS — L821 Other seborrheic keratosis: Secondary | ICD-10-CM | POA: Diagnosis not present

## 2019-08-29 DIAGNOSIS — L812 Freckles: Secondary | ICD-10-CM | POA: Diagnosis not present

## 2019-08-29 DIAGNOSIS — Z85828 Personal history of other malignant neoplasm of skin: Secondary | ICD-10-CM | POA: Diagnosis not present

## 2019-08-29 DIAGNOSIS — L820 Inflamed seborrheic keratosis: Secondary | ICD-10-CM | POA: Diagnosis not present

## 2019-08-29 DIAGNOSIS — D1801 Hemangioma of skin and subcutaneous tissue: Secondary | ICD-10-CM | POA: Diagnosis not present

## 2019-10-03 DIAGNOSIS — C661 Malignant neoplasm of right ureter: Secondary | ICD-10-CM | POA: Diagnosis not present

## 2019-10-12 ENCOUNTER — Encounter: Payer: Medicare HMO | Admitting: Family Medicine

## 2019-10-15 ENCOUNTER — Other Ambulatory Visit: Payer: Self-pay

## 2019-10-15 ENCOUNTER — Encounter: Payer: Self-pay | Admitting: Family Medicine

## 2019-10-15 ENCOUNTER — Ambulatory Visit (INDEPENDENT_AMBULATORY_CARE_PROVIDER_SITE_OTHER): Payer: Medicare HMO | Admitting: Family Medicine

## 2019-10-15 VITALS — BP 132/74 | HR 72 | Temp 98.7°F | Resp 14 | Ht 72.0 in | Wt 188.0 lb

## 2019-10-15 DIAGNOSIS — I2510 Atherosclerotic heart disease of native coronary artery without angina pectoris: Secondary | ICD-10-CM | POA: Diagnosis not present

## 2019-10-15 DIAGNOSIS — E782 Mixed hyperlipidemia: Secondary | ICD-10-CM | POA: Diagnosis not present

## 2019-10-15 DIAGNOSIS — Z0000 Encounter for general adult medical examination without abnormal findings: Secondary | ICD-10-CM

## 2019-10-15 DIAGNOSIS — Z0001 Encounter for general adult medical examination with abnormal findings: Secondary | ICD-10-CM

## 2019-10-15 DIAGNOSIS — N183 Chronic kidney disease, stage 3 unspecified: Secondary | ICD-10-CM | POA: Diagnosis not present

## 2019-10-15 DIAGNOSIS — M542 Cervicalgia: Secondary | ICD-10-CM

## 2019-10-15 DIAGNOSIS — N1831 Chronic kidney disease, stage 3a: Secondary | ICD-10-CM

## 2019-10-15 DIAGNOSIS — Z905 Acquired absence of kidney: Secondary | ICD-10-CM

## 2019-10-15 DIAGNOSIS — G4701 Insomnia due to medical condition: Secondary | ICD-10-CM

## 2019-10-15 DIAGNOSIS — I4891 Unspecified atrial fibrillation: Secondary | ICD-10-CM | POA: Diagnosis not present

## 2019-10-15 DIAGNOSIS — I4821 Permanent atrial fibrillation: Secondary | ICD-10-CM

## 2019-10-15 MED ORDER — HYDROCODONE-ACETAMINOPHEN 5-325 MG PO TABS: 1.0000 | ORAL_TABLET | ORAL | 0 refills | Status: DC | PRN
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
## 2019-10-15 NOTE — Patient Instructions (Addendum)
F/U for lab visit  Get xray of neck  [facility]  [place]  [address]   pain medication refilled F/U 6 months

## 2019-10-16 ENCOUNTER — Ambulatory Visit
Admission: RE | Admit: 2019-10-16 | Discharge: 2019-10-16 | Disposition: A | Discharge status: Home / Self Care | Payer: Medicare HMO | Source: Ambulatory Visit | Attending: Family Medicine | Admitting: Family Medicine

## 2019-10-16 ENCOUNTER — Other Ambulatory Visit: Payer: Medicare HMO

## 2019-10-16 DIAGNOSIS — I2510 Atherosclerotic heart disease of native coronary artery without angina pectoris: Secondary | ICD-10-CM | POA: Diagnosis not present

## 2019-10-16 DIAGNOSIS — E782 Mixed hyperlipidemia: Secondary | ICD-10-CM | POA: Diagnosis not present

## 2019-10-16 DIAGNOSIS — M542 Cervicalgia: Secondary | ICD-10-CM

## 2019-10-16 DIAGNOSIS — Z125 Encounter for screening for malignant neoplasm of prostate: Secondary | ICD-10-CM | POA: Diagnosis not present

## 2019-10-17 LAB — CBC WITH DIFFERENTIAL/PLATELET
Absolute Monocytes: 606 cells/uL (ref 200–950)
Basophils Absolute: 37 cells/uL (ref 0–200)
Basophils Relative: 0.5 %
Eosinophils Absolute: 153 cells/uL (ref 15–500)
Eosinophils Relative: 2.1 %
HCT: 39.1 % (ref 38.5–50.0)
Hemoglobin: 12.9 g/dL — ABNORMAL LOW (ref 13.2–17.1)
Lymphs Abs: 1190 cells/uL (ref 850–3900)
MCH: 30.3 pg (ref 27.0–33.0)
MCHC: 33 g/dL (ref 32.0–36.0)
MCV: 91.8 fL (ref 80.0–100.0)
MPV: 10.3 fL (ref 7.5–12.5)
Monocytes Relative: 8.3 %
Neutro Abs: 5314 cells/uL (ref 1500–7800)
Neutrophils Relative %: 72.8 %
Platelets: 217 10*3/uL (ref 140–400)
RBC: 4.26 10*6/uL (ref 4.20–5.80)
RDW: 12.2 % (ref 11.0–15.0)
Total Lymphocyte: 16.3 %
WBC: 7.3 10*3/uL (ref 3.8–10.8)

## 2019-10-17 LAB — COMPREHENSIVE METABOLIC PANEL
AG Ratio: 1.5 (calc) (ref 1.0–2.5)
ALT: 12 U/L (ref 9–46)
AST: 19 U/L (ref 10–35)
Albumin: 3.7 g/dL (ref 3.6–5.1)
Alkaline phosphatase (APISO): 71 U/L (ref 35–144)
BUN/Creatinine Ratio: 15 (calc) (ref 6–22)
BUN: 20 mg/dL (ref 7–25)
CO2: 26 mmol/L (ref 20–32)
Calcium: 9.1 mg/dL (ref 8.6–10.3)
Chloride: 104 mmol/L (ref 98–110)
Creat: 1.37 mg/dL — ABNORMAL HIGH (ref 0.70–1.11)
Globulin: 2.5 g/dL (calc) (ref 1.9–3.7)
Glucose, Bld: 97 mg/dL (ref 65–99)
Potassium: 4.3 mmol/L (ref 3.5–5.3)
Sodium: 141 mmol/L (ref 135–146)
Total Bilirubin: 0.7 mg/dL (ref 0.2–1.2)
Total Protein: 6.2 g/dL (ref 6.1–8.1)

## 2019-10-17 LAB — LIPID PANEL
Cholesterol: 126 mg/dL (ref <200)
HDL: 38 mg/dL — ABNORMAL LOW (ref >=40)
LDL Cholesterol (Calc): 71 mg/dL (calc)
Non-HDL Cholesterol (Calc): 88 mg/dL (calc) (ref <130)
Total CHOL/HDL Ratio: 3.3 (calc) (ref <5.0)
Triglycerides: 89 mg/dL (ref <150)

## 2019-10-17 LAB — PSA: PSA: 6.8 ng/mL — ABNORMAL HIGH (ref <=4.0)

## 2019-10-22 ENCOUNTER — Encounter

## 2019-10-22 ENCOUNTER — Encounter: Payer: Self-pay | Admitting: Cardiovascular Disease

## 2019-10-22 ENCOUNTER — Ambulatory Visit: Payer: Medicare HMO | Admitting: Cardiovascular Disease

## 2019-10-22 ENCOUNTER — Other Ambulatory Visit: Payer: Self-pay

## 2019-10-22 VITALS — BP 140/82 | HR 77 | Ht 72.0 in | Wt 195.8 lb

## 2019-10-22 DIAGNOSIS — E782 Mixed hyperlipidemia: Secondary | ICD-10-CM | POA: Diagnosis not present

## 2019-10-22 DIAGNOSIS — N183 Chronic kidney disease, stage 3 unspecified: Secondary | ICD-10-CM | POA: Diagnosis not present

## 2019-10-22 DIAGNOSIS — I2510 Atherosclerotic heart disease of native coronary artery without angina pectoris: Secondary | ICD-10-CM

## 2019-10-22 DIAGNOSIS — I4821 Permanent atrial fibrillation: Secondary | ICD-10-CM | POA: Diagnosis not present
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
## 2019-10-22 NOTE — Patient Instructions (Signed)

## 2019-10-23 ENCOUNTER — Other Ambulatory Visit: Payer: Self-pay | Admitting: Family Medicine

## 2019-10-23 MED ORDER — TRAZODONE HCL 50 MG PO TABS: 25.0000 mg | ORAL_TABLET | Freq: Every evening | ORAL | 3 refills | Status: DC | PRN

## 2019-11-07 DIAGNOSIS — R030 Elevated blood-pressure reading, without diagnosis of hypertension: Secondary | ICD-10-CM | POA: Insufficient documentation

## 2019-11-07 DIAGNOSIS — M5416 Radiculopathy, lumbar region: Secondary | ICD-10-CM | POA: Diagnosis not present

## 2019-11-07 DIAGNOSIS — M5136 Other intervertebral disc degeneration, lumbar region: Secondary | ICD-10-CM | POA: Insufficient documentation

## 2019-11-07 DIAGNOSIS — M545 Low back pain: Secondary | ICD-10-CM | POA: Diagnosis not present

## 2019-11-07 DIAGNOSIS — M47816 Spondylosis without myelopathy or radiculopathy, lumbar region: Secondary | ICD-10-CM | POA: Diagnosis not present

## 2019-11-07 HISTORY — DX: Elevated blood-pressure reading, without diagnosis of hypertension: R03.0

## 2019-11-14 ENCOUNTER — Encounter

## 2019-11-20 DIAGNOSIS — L820 Inflamed seborrheic keratosis: Secondary | ICD-10-CM | POA: Diagnosis not present

## 2019-11-20 DIAGNOSIS — D485 Neoplasm of uncertain behavior of skin: Secondary | ICD-10-CM | POA: Diagnosis not present

## 2019-11-20 DIAGNOSIS — Z85828 Personal history of other malignant neoplasm of skin: Secondary | ICD-10-CM | POA: Diagnosis not present

## 2019-11-26 ENCOUNTER — Other Ambulatory Visit: Payer: Self-pay | Admitting: Family Medicine

## 2019-12-06 DIAGNOSIS — M4726 Other spondylosis with radiculopathy, lumbar region: Secondary | ICD-10-CM | POA: Diagnosis not present

## 2019-12-06 DIAGNOSIS — M48061 Spinal stenosis, lumbar region without neurogenic claudication: Secondary | ICD-10-CM | POA: Diagnosis not present

## 2019-12-06 DIAGNOSIS — M5136 Other intervertebral disc degeneration, lumbar region: Secondary | ICD-10-CM | POA: Diagnosis not present

## 2019-12-24 DIAGNOSIS — H5203 Hypermetropia, bilateral: Secondary | ICD-10-CM | POA: Diagnosis not present

## 2019-12-24 DIAGNOSIS — H52223 Regular astigmatism, bilateral: Secondary | ICD-10-CM | POA: Diagnosis not present

## 2019-12-24 DIAGNOSIS — H524 Presbyopia: Secondary | ICD-10-CM | POA: Diagnosis not present

## 2019-12-24 DIAGNOSIS — H43813 Vitreous degeneration, bilateral: Secondary | ICD-10-CM | POA: Diagnosis not present

## 2019-12-24 DIAGNOSIS — H25813 Combined forms of age-related cataract, bilateral: Secondary | ICD-10-CM | POA: Diagnosis not present

## 2020-01-07 ENCOUNTER — Other Ambulatory Visit: Payer: Self-pay | Admitting: Family Medicine

## 2020-01-29 DIAGNOSIS — M48062 Spinal stenosis, lumbar region with neurogenic claudication: Secondary | ICD-10-CM | POA: Diagnosis not present

## 2020-01-29 DIAGNOSIS — M5416 Radiculopathy, lumbar region: Secondary | ICD-10-CM | POA: Diagnosis not present

## 2020-01-29 DIAGNOSIS — M47816 Spondylosis without myelopathy or radiculopathy, lumbar region: Secondary | ICD-10-CM | POA: Diagnosis not present

## 2020-01-29 DIAGNOSIS — M5136 Other intervertebral disc degeneration, lumbar region: Secondary | ICD-10-CM | POA: Diagnosis not present

## 2020-02-28 DIAGNOSIS — Z20828 Contact with and (suspected) exposure to other viral communicable diseases: Secondary | ICD-10-CM | POA: Diagnosis not present

## 2020-03-03 DIAGNOSIS — N183 Chronic kidney disease, stage 3 unspecified: Secondary | ICD-10-CM | POA: Diagnosis not present

## 2020-03-20 DIAGNOSIS — N2581 Secondary hyperparathyroidism of renal origin: Secondary | ICD-10-CM | POA: Diagnosis not present

## 2020-03-20 DIAGNOSIS — I129 Hypertensive chronic kidney disease with stage 1 through stage 4 chronic kidney disease, or unspecified chronic kidney disease: Secondary | ICD-10-CM | POA: Diagnosis not present

## 2020-03-20 DIAGNOSIS — N183 Chronic kidney disease, stage 3 unspecified: Secondary | ICD-10-CM | POA: Diagnosis not present

## 2020-03-20 DIAGNOSIS — D631 Anemia in chronic kidney disease: Secondary | ICD-10-CM | POA: Diagnosis not present

## 2020-03-26 ENCOUNTER — Other Ambulatory Visit: Payer: Self-pay | Admitting: Family Medicine

## 2020-03-26 DIAGNOSIS — R972 Elevated prostate specific antigen [PSA]: Secondary | ICD-10-CM | POA: Diagnosis not present

## 2020-04-02 DIAGNOSIS — R972 Elevated prostate specific antigen [PSA]: Secondary | ICD-10-CM | POA: Diagnosis not present

## 2020-04-11 ENCOUNTER — Ambulatory Visit (INDEPENDENT_AMBULATORY_CARE_PROVIDER_SITE_OTHER): Payer: Medicare HMO | Admitting: Family Medicine

## 2020-04-11 ENCOUNTER — Encounter: Payer: Self-pay | Admitting: Family Medicine

## 2020-04-11 ENCOUNTER — Other Ambulatory Visit: Payer: Self-pay

## 2020-04-11 VITALS — BP 132/80 | HR 68 | Temp 97.7°F | Resp 14 | Ht 72.0 in | Wt 188.0 lb

## 2020-04-11 DIAGNOSIS — E782 Mixed hyperlipidemia: Secondary | ICD-10-CM | POA: Diagnosis not present

## 2020-04-11 DIAGNOSIS — G4701 Insomnia due to medical condition: Secondary | ICD-10-CM | POA: Diagnosis not present

## 2020-04-11 DIAGNOSIS — Z905 Acquired absence of kidney: Secondary | ICD-10-CM

## 2020-04-11 DIAGNOSIS — I2510 Atherosclerotic heart disease of native coronary artery without angina pectoris: Secondary | ICD-10-CM | POA: Diagnosis not present

## 2020-04-11 DIAGNOSIS — I4811 Longstanding persistent atrial fibrillation: Secondary | ICD-10-CM

## 2020-04-11 MED ORDER — TRAZODONE HCL 50 MG PO TABS: ORAL_TABLET | ORAL | 3 refills | Status: DC

## 2020-04-11 MED ORDER — NIACIN ER (ANTIHYPERLIPIDEMIC) 500 MG PO TBCR: 500.0000 mg | EXTENDED_RELEASE_TABLET | Freq: Every day | ORAL | Status: AC

## 2020-04-11 MED ORDER — HYDROCODONE-ACETAMINOPHEN 5-325 MG PO TABS: 1.0000 | ORAL_TABLET | Freq: Four times a day (QID) | ORAL | 0 refills | Status: DC | PRN

## 2020-04-11 NOTE — Patient Instructions (Signed)
Increase trazodone to 1.5 tablets ( 75mg ) at bedtime We will call with results F/U 6 months for Physical

## 2020-04-11 NOTE — Progress Notes (Signed)
   Subjective:    Patient ID: [name] [name], male    DOB: 1941-07-18, 79 y.o.   MRN: [id number]  Patient presents for Follow-up (is fasting) Here to follow-up chronic medical problems.  Medications reviewed.  CAD/A fib-plan for fasting labs.  Follows with cardiology. Last visit in December  Continue Lipitor. He declines anticoagulation   .  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  He has hydrocodone at home but has not taken in past 2 months, uses tylenol    Dr.Nuedelman has retired he will be set up with a new spinal specilaist, he had injection in his spine a few months ago  Chronic insomnia - he takes trazodone most days only sleeps about 3 hours, would like to try a higher dose, no SE with the meds  CKD- he is followed by nephrology - Dr. Elmarie Shiley, has , history of right nephrectomy   Elevated PSA per urology- PSA was below <4 , urinating normally    Still goes to the gym  Review Of Systems:  GEN- denies fatigue, fever, weight loss,weakness, recent illness HEENT- denies eye drainage, change in vision, nasal discharge, CVS- denies chest pain, palpitations RESP- denies SOB, cough, wheeze ABD- denies N/V, change in stools, abd pain GU- denies dysuria, hematuria, dribbling, incontinence MSK- +joint pain, muscle aches, injury Neuro- denies headache, dizziness, syncope, seizure activity       Objective:    BP 132/80   Pulse 68   Temp 97.7 F (36.5 C) (Temporal)   Resp 14   Ht 6' (1.829 m)   Wt 188 lb (85.3 kg)   SpO2 97%   BMI 25.50 kg/m  GEN- NAD, alert and oriented x3 HEENT- PERRL, EOMI, non injected sclera, pink conjunctiva, MMM, oropharynx clear Neck- Supple, no thyromegaly, decreased ROM CVS- irregularly rhythem, normal rate , no murmur RESP-CTAB ABD-NABS,soft,NT,ND Psych- normal affect and mood  EXT- No edema Pulses- Radial 2+, DP- 1+        Assessment & Plan:       Problem List Items Addressed This Visit      Unprioritized   Atrial fibrillation (HCC)    Rate controlled       Relevant Medications   niacin (NIASPAN) 500 MG CR tablet   CAD (coronary artery disease) - Primary    Blood pressure controlled Pt asymptomatic Followed by cardiology, on station drug       Relevant Medications   niacin (NIASPAN) 500 MG CR tablet   Other Relevant Orders   Comprehensive metabolic panel (Completed)   H/O right nephrectomy    Obtain recent renal note and labs       Insomnia    Increase trazodone to 75mg  at bedtime He will let us know if this is not helping       Mixed hyperlipidemia   Relevant Medications   niacin (NIASPAN) 500 MG CR tablet   Other Relevant Orders   Lipid panel (Completed)      Note: This dictation was prepared with Dragon dictation along with smaller phrase technology. Any transcriptional errors that result from this process are unintentional.

## 2020-04-12 ENCOUNTER — Encounter: Payer: Self-pay | Admitting: Family Medicine

## 2020-04-12 LAB — COMPREHENSIVE METABOLIC PANEL
AG Ratio: 1.4 (calc) (ref 1.0–2.5)
ALT: 9 U/L (ref 9–46)
AST: 16 U/L (ref 10–35)
Albumin: 3.9 g/dL (ref 3.6–5.1)
Alkaline phosphatase (APISO): 72 U/L (ref 35–144)
BUN/Creatinine Ratio: 18 (calc) (ref 6–22)
BUN: 26 mg/dL — ABNORMAL HIGH (ref 7–25)
CO2: 26 mmol/L (ref 20–32)
Calcium: 9.2 mg/dL (ref 8.6–10.3)
Chloride: 104 mmol/L (ref 98–110)
Creat: 1.46 mg/dL — ABNORMAL HIGH (ref 0.70–1.11)
Globulin: 2.8 g/dL (calc) (ref 1.9–3.7)
Glucose, Bld: 100 mg/dL — ABNORMAL HIGH (ref 65–99)
Potassium: 4.2 mmol/L (ref 3.5–5.3)
Sodium: 141 mmol/L (ref 135–146)
Total Bilirubin: 0.9 mg/dL (ref 0.2–1.2)
Total Protein: 6.7 g/dL (ref 6.1–8.1)

## 2020-04-12 LAB — LIPID PANEL
Cholesterol: 129 mg/dL (ref <200)
HDL: 38 mg/dL — ABNORMAL LOW (ref >=40)
LDL Cholesterol (Calc): 76 mg/dL (calc)
Non-HDL Cholesterol (Calc): 91 mg/dL (calc) (ref <130)
Total CHOL/HDL Ratio: 3.4 (calc) (ref <5.0)
Triglycerides: 72 mg/dL (ref <150)

## 2020-04-12 NOTE — Assessment & Plan Note (Signed)
Increase trazodone to 75mg  at bedtime He will let us know if this is not helping

## 2020-04-12 NOTE — Assessment & Plan Note (Signed)
Obtain recent renal note and labs

## 2020-04-12 NOTE — Assessment & Plan Note (Signed)
Rate controlled 

## 2020-04-12 NOTE — Assessment & Plan Note (Signed)
Blood pressure controlled Pt asymptomatic Followed by cardiology, on station drug

## 2020-04-15 ENCOUNTER — Encounter: Payer: Self-pay | Admitting: *Deleted

## 2020-05-02 ENCOUNTER — Other Ambulatory Visit: Payer: Self-pay | Admitting: Family Medicine

## 2020-05-05 ENCOUNTER — Other Ambulatory Visit: Payer: Self-pay | Admitting: Family Medicine

## 2020-05-06 DIAGNOSIS — H524 Presbyopia: Secondary | ICD-10-CM | POA: Diagnosis not present

## 2020-05-06 DIAGNOSIS — H52223 Regular astigmatism, bilateral: Secondary | ICD-10-CM | POA: Diagnosis not present

## 2020-06-26 ENCOUNTER — Other Ambulatory Visit: Payer: Self-pay | Admitting: *Deleted

## 2020-06-26 MED ORDER — TRAZODONE HCL 50 MG PO TABS: 25.0000 mg | ORAL_TABLET | Freq: Every evening | ORAL | 3 refills | Status: DC | PRN

## 2020-07-14 DIAGNOSIS — H43393 Other vitreous opacities, bilateral: Secondary | ICD-10-CM | POA: Diagnosis not present

## 2020-07-14 DIAGNOSIS — H35363 Drusen (degenerative) of macula, bilateral: Secondary | ICD-10-CM | POA: Diagnosis not present

## 2020-07-14 DIAGNOSIS — H43822 Vitreomacular adhesion, left eye: Secondary | ICD-10-CM | POA: Diagnosis not present

## 2020-07-14 DIAGNOSIS — H33323 Round hole, bilateral: Secondary | ICD-10-CM | POA: Diagnosis not present

## 2020-07-14 DIAGNOSIS — H43813 Vitreous degeneration, bilateral: Secondary | ICD-10-CM | POA: Diagnosis not present

## 2020-07-14 DIAGNOSIS — H25813 Combined forms of age-related cataract, bilateral: Secondary | ICD-10-CM | POA: Diagnosis not present

## 2020-07-14 DIAGNOSIS — H35412 Lattice degeneration of retina, left eye: Secondary | ICD-10-CM | POA: Diagnosis not present

## 2020-07-25 IMAGING — CR DG ABD PORTABLE 2V
1 series · 2 of 2 positions shown · non-contrast
Comparison: CT of the abdomen pelvis dated 09/10/2016

CLINICAL DATA: 77-year-old male with nausea vomiting.

EXAM:
PORTABLE ABDOMEN - 2 VIEW

[Series 1: supine ap · 0.17mm/px · 2 of 2 slices shown]
[im 1/2]
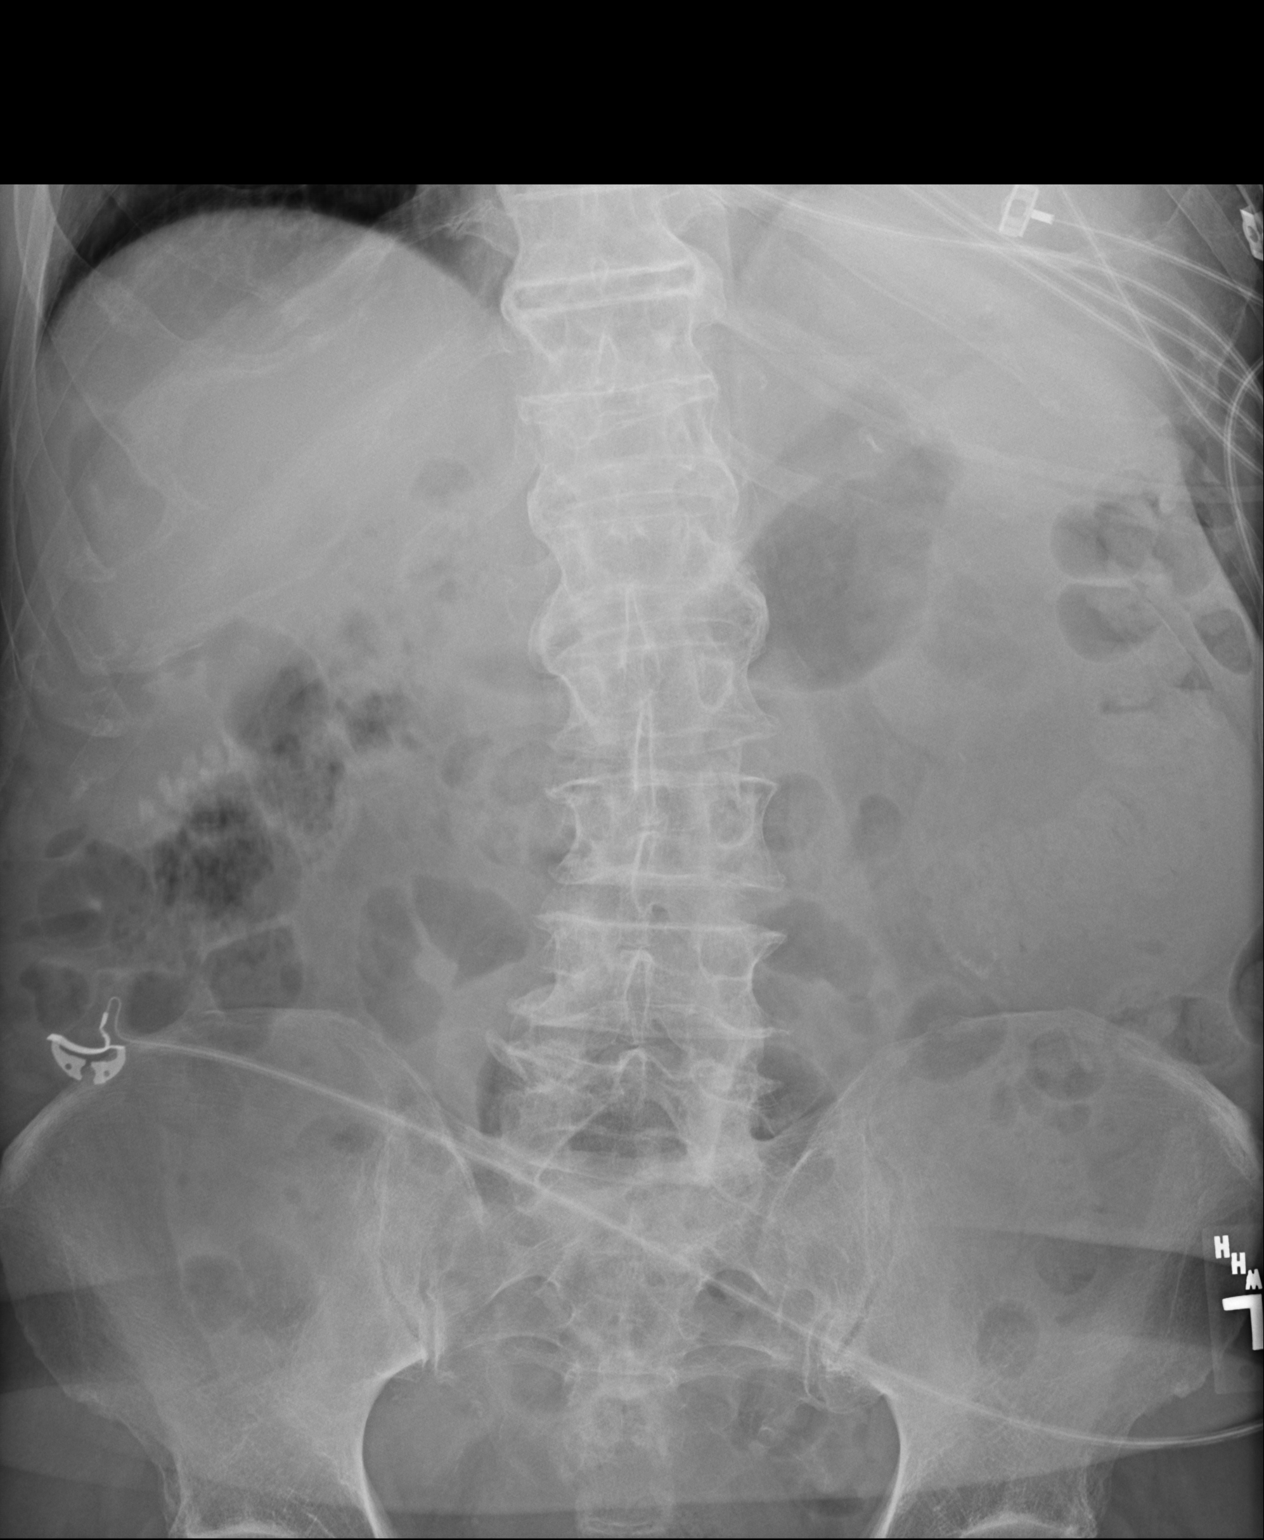
[im 2/2]
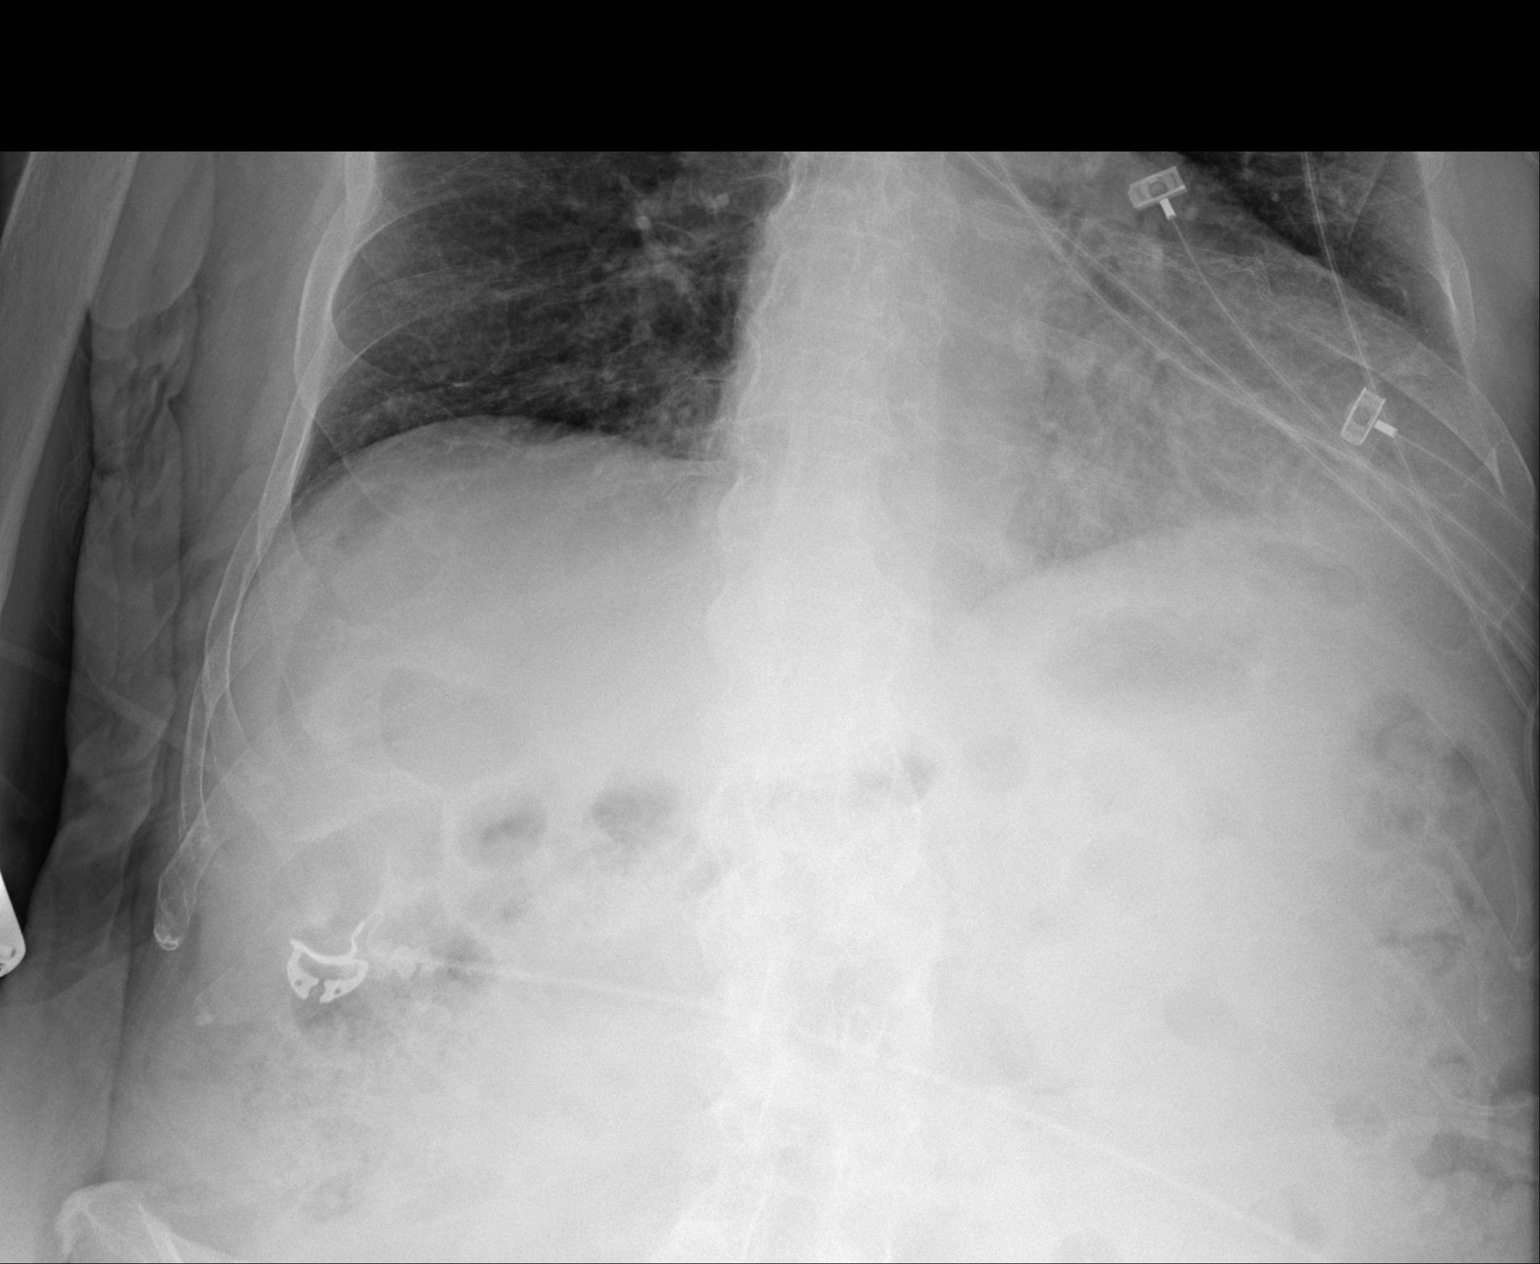

[2 of 2 positions shown; findings below may reference images not displayed]

FINDINGS: There is moderate stool throughout the colon. No bowel dilatation or
evidence of obstruction. Multiple gallstones noted. No free air.
Degenerative changes of the spine.
IMPRESSION: 1. Constipation.  No evidence of bowel obstruction.
2. Cholelithiasis.

## 2020-07-27 IMAGING — DX DG CHEST 2V
2 series · 2 of 2 positions shown · non-contrast
Comparison: 04/05/2012

CLINICAL DATA: Chest pressure

EXAM:
CHEST - 2 VIEW

[chest pa]
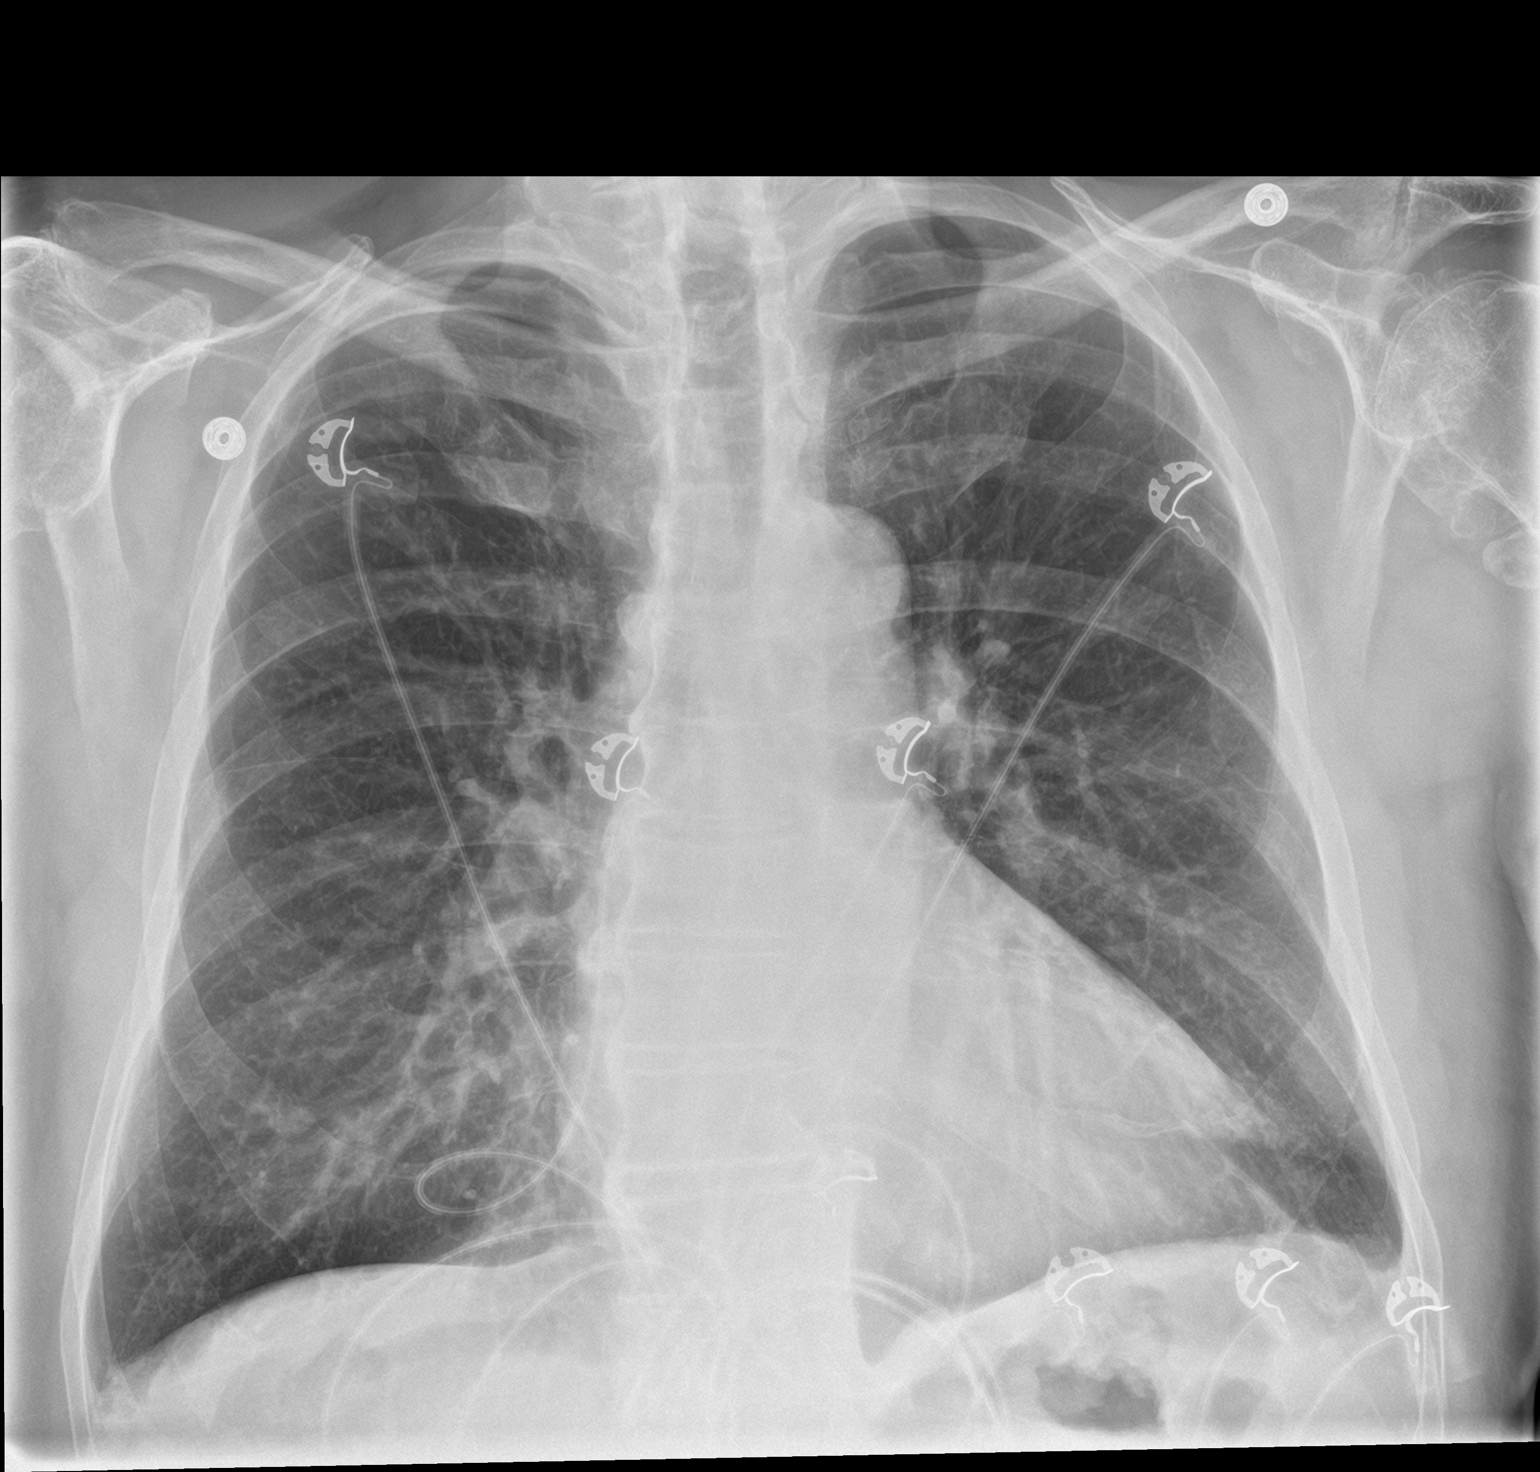

[chest lat]
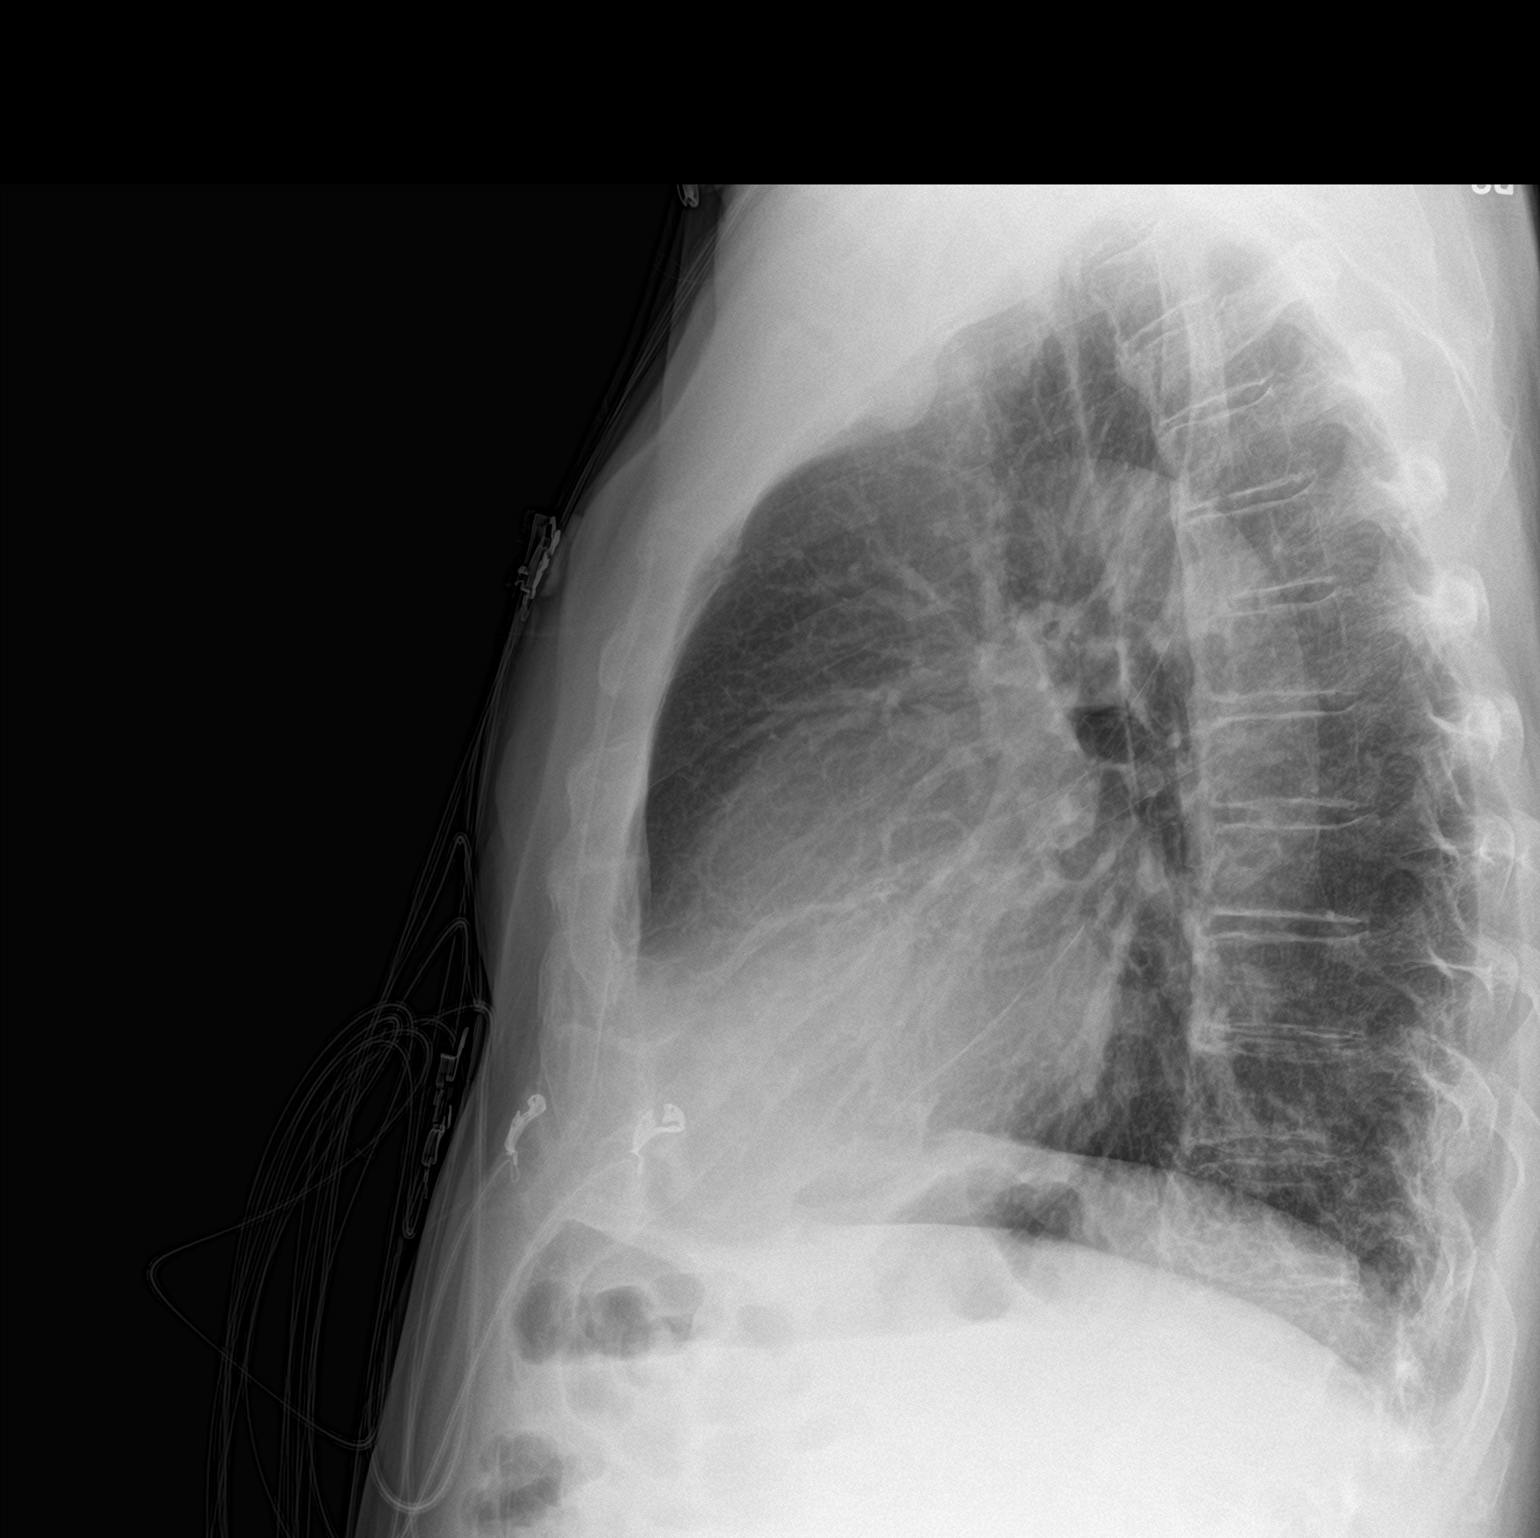

[2 of 2 positions shown; findings below may reference images not displayed]

FINDINGS: There is hyperinflation of the lungs compatible with COPD. Mild
cardiomegaly. No confluent opacities or effusions. No acute bony
abnormality. Advanced degenerative changes in the shoulders.
IMPRESSION: COPD.  Borderline cardiomegaly.  No active disease.

## 2020-07-29 DIAGNOSIS — M5136 Other intervertebral disc degeneration, lumbar region: Secondary | ICD-10-CM | POA: Diagnosis not present

## 2020-08-05 DIAGNOSIS — H33321 Round hole, right eye: Secondary | ICD-10-CM | POA: Diagnosis not present

## 2020-08-11 DIAGNOSIS — H33322 Round hole, left eye: Secondary | ICD-10-CM | POA: Diagnosis not present

## 2020-08-26 ENCOUNTER — Ambulatory Visit (INDEPENDENT_AMBULATORY_CARE_PROVIDER_SITE_OTHER): Payer: Medicare HMO | Admitting: *Deleted

## 2020-08-26 ENCOUNTER — Other Ambulatory Visit: Payer: Self-pay

## 2020-08-26 DIAGNOSIS — Z23 Encounter for immunization: Secondary | ICD-10-CM | POA: Diagnosis not present

## 2020-08-28 DIAGNOSIS — D1801 Hemangioma of skin and subcutaneous tissue: Secondary | ICD-10-CM | POA: Diagnosis not present

## 2020-08-28 DIAGNOSIS — D2272 Melanocytic nevi of left lower limb, including hip: Secondary | ICD-10-CM | POA: Diagnosis not present

## 2020-08-28 DIAGNOSIS — L812 Freckles: Secondary | ICD-10-CM | POA: Diagnosis not present

## 2020-08-28 DIAGNOSIS — Z85828 Personal history of other malignant neoplasm of skin: Secondary | ICD-10-CM | POA: Diagnosis not present

## 2020-08-28 DIAGNOSIS — D2262 Melanocytic nevi of left upper limb, including shoulder: Secondary | ICD-10-CM | POA: Diagnosis not present

## 2020-08-28 DIAGNOSIS — C44519 Basal cell carcinoma of skin of other part of trunk: Secondary | ICD-10-CM | POA: Diagnosis not present

## 2020-08-28 DIAGNOSIS — L821 Other seborrheic keratosis: Secondary | ICD-10-CM | POA: Diagnosis not present

## 2020-08-28 DIAGNOSIS — D485 Neoplasm of uncertain behavior of skin: Secondary | ICD-10-CM | POA: Diagnosis not present

## 2020-08-28 DIAGNOSIS — D225 Melanocytic nevi of trunk: Secondary | ICD-10-CM | POA: Diagnosis not present

## 2020-08-28 DIAGNOSIS — D2261 Melanocytic nevi of right upper limb, including shoulder: Secondary | ICD-10-CM | POA: Diagnosis not present

## 2020-09-12 DIAGNOSIS — E113599 Type 2 diabetes mellitus with proliferative diabetic retinopathy without macular edema, unspecified eye: Secondary | ICD-10-CM | POA: Diagnosis not present

## 2020-09-12 DIAGNOSIS — H04123 Dry eye syndrome of bilateral lacrimal glands: Secondary | ICD-10-CM | POA: Diagnosis not present

## 2020-09-12 DIAGNOSIS — H5203 Hypermetropia, bilateral: Secondary | ICD-10-CM | POA: Diagnosis not present

## 2020-09-22 DIAGNOSIS — N183 Chronic kidney disease, stage 3 unspecified: Secondary | ICD-10-CM | POA: Diagnosis not present

## 2020-09-29 ENCOUNTER — Encounter (HOSPITAL_COMMUNITY): Payer: Self-pay | Admitting: Emergency Medicine

## 2020-09-29 ENCOUNTER — Inpatient Hospital Stay (HOSPITAL_COMMUNITY)
Admission: EM | Admit: 2020-09-29 | Discharge: 2020-10-01 | DRG: 065 | Disposition: A | Discharge status: Home / Self Care | Payer: Medicare HMO | Attending: Neurology | Admitting: Neurology

## 2020-09-29 ENCOUNTER — Other Ambulatory Visit: Payer: Self-pay

## 2020-09-29 ENCOUNTER — Emergency Department (HOSPITAL_COMMUNITY): Payer: Medicare HMO

## 2020-09-29 ENCOUNTER — Inpatient Hospital Stay (HOSPITAL_COMMUNITY): Payer: Medicare HMO

## 2020-09-29 DIAGNOSIS — J90 Pleural effusion, not elsewhere classified: Secondary | ICD-10-CM | POA: Diagnosis not present

## 2020-09-29 DIAGNOSIS — Z91041 Radiographic dye allergy status: Secondary | ICD-10-CM | POA: Diagnosis not present

## 2020-09-29 DIAGNOSIS — I4891 Unspecified atrial fibrillation: Secondary | ICD-10-CM | POA: Diagnosis not present

## 2020-09-29 DIAGNOSIS — G459 Transient cerebral ischemic attack, unspecified: Secondary | ICD-10-CM

## 2020-09-29 DIAGNOSIS — I4821 Permanent atrial fibrillation: Secondary | ICD-10-CM | POA: Diagnosis not present

## 2020-09-29 DIAGNOSIS — I749 Embolism and thrombosis of unspecified artery: Secondary | ICD-10-CM

## 2020-09-29 DIAGNOSIS — Z85528 Personal history of other malignant neoplasm of kidney: Secondary | ICD-10-CM

## 2020-09-29 DIAGNOSIS — I340 Nonrheumatic mitral (valve) insufficiency: Secondary | ICD-10-CM | POA: Diagnosis not present

## 2020-09-29 DIAGNOSIS — I10 Essential (primary) hypertension: Secondary | ICD-10-CM | POA: Diagnosis not present

## 2020-09-29 DIAGNOSIS — R29701 NIHSS score 1: Secondary | ICD-10-CM | POA: Diagnosis not present

## 2020-09-29 DIAGNOSIS — Z85828 Personal history of other malignant neoplasm of skin: Secondary | ICD-10-CM | POA: Diagnosis not present

## 2020-09-29 DIAGNOSIS — N1831 Chronic kidney disease, stage 3a: Secondary | ICD-10-CM | POA: Diagnosis not present

## 2020-09-29 DIAGNOSIS — Z905 Acquired absence of kidney: Secondary | ICD-10-CM | POA: Diagnosis not present

## 2020-09-29 DIAGNOSIS — E782 Mixed hyperlipidemia: Secondary | ICD-10-CM | POA: Diagnosis not present

## 2020-09-29 DIAGNOSIS — M19012 Primary osteoarthritis, left shoulder: Secondary | ICD-10-CM | POA: Diagnosis present

## 2020-09-29 DIAGNOSIS — R29810 Facial weakness: Secondary | ICD-10-CM | POA: Diagnosis not present

## 2020-09-29 DIAGNOSIS — I672 Cerebral atherosclerosis: Secondary | ICD-10-CM | POA: Diagnosis not present

## 2020-09-29 DIAGNOSIS — M1909 Primary osteoarthritis, other specified site: Secondary | ICD-10-CM | POA: Diagnosis present

## 2020-09-29 DIAGNOSIS — I639 Cerebral infarction, unspecified: Secondary | ICD-10-CM

## 2020-09-29 DIAGNOSIS — Z87891 Personal history of nicotine dependence: Secondary | ICD-10-CM | POA: Diagnosis not present

## 2020-09-29 DIAGNOSIS — G4700 Insomnia, unspecified: Secondary | ICD-10-CM | POA: Diagnosis present

## 2020-09-29 DIAGNOSIS — M19011 Primary osteoarthritis, right shoulder: Secondary | ICD-10-CM | POA: Diagnosis present

## 2020-09-29 DIAGNOSIS — R410 Disorientation, unspecified: Secondary | ICD-10-CM | POA: Diagnosis not present

## 2020-09-29 DIAGNOSIS — G8191 Hemiplegia, unspecified affecting right dominant side: Secondary | ICD-10-CM | POA: Diagnosis present

## 2020-09-29 DIAGNOSIS — Z79899 Other long term (current) drug therapy: Secondary | ICD-10-CM

## 2020-09-29 DIAGNOSIS — Z96653 Presence of artificial knee joint, bilateral: Secondary | ICD-10-CM | POA: Diagnosis present

## 2020-09-29 DIAGNOSIS — I63512 Cerebral infarction due to unspecified occlusion or stenosis of left middle cerebral artery: Secondary | ICD-10-CM | POA: Diagnosis not present

## 2020-09-29 DIAGNOSIS — I6340 Cerebral infarction due to embolism of unspecified cerebral artery: Secondary | ICD-10-CM | POA: Diagnosis not present

## 2020-09-29 DIAGNOSIS — R29700 NIHSS score 0: Secondary | ICD-10-CM | POA: Diagnosis not present

## 2020-09-29 DIAGNOSIS — K219 Gastro-esophageal reflux disease without esophagitis: Secondary | ICD-10-CM | POA: Diagnosis not present

## 2020-09-29 DIAGNOSIS — I2510 Atherosclerotic heart disease of native coronary artery without angina pectoris: Secondary | ICD-10-CM | POA: Diagnosis present

## 2020-09-29 DIAGNOSIS — G319 Degenerative disease of nervous system, unspecified: Secondary | ICD-10-CM | POA: Diagnosis not present

## 2020-09-29 DIAGNOSIS — I252 Old myocardial infarction: Secondary | ICD-10-CM

## 2020-09-29 DIAGNOSIS — I517 Cardiomegaly: Secondary | ICD-10-CM | POA: Diagnosis not present

## 2020-09-29 DIAGNOSIS — Z20822 Contact with and (suspected) exposure to covid-19: Secondary | ICD-10-CM | POA: Diagnosis not present

## 2020-09-29 DIAGNOSIS — Z955 Presence of coronary angioplasty implant and graft: Secondary | ICD-10-CM | POA: Diagnosis not present

## 2020-09-29 DIAGNOSIS — I482 Chronic atrial fibrillation, unspecified: Secondary | ICD-10-CM | POA: Diagnosis not present

## 2020-09-29 DIAGNOSIS — I129 Hypertensive chronic kidney disease with stage 1 through stage 4 chronic kidney disease, or unspecified chronic kidney disease: Secondary | ICD-10-CM | POA: Diagnosis present

## 2020-09-29 DIAGNOSIS — I6502 Occlusion and stenosis of left vertebral artery: Secondary | ICD-10-CM | POA: Diagnosis not present

## 2020-09-29 DIAGNOSIS — I495 Sick sinus syndrome: Secondary | ICD-10-CM | POA: Diagnosis present

## 2020-09-29 DIAGNOSIS — I6389 Other cerebral infarction: Secondary | ICD-10-CM | POA: Diagnosis not present

## 2020-09-29 DIAGNOSIS — R4701 Aphasia: Secondary | ICD-10-CM | POA: Diagnosis present

## 2020-09-29 DIAGNOSIS — R471 Dysarthria and anarthria: Secondary | ICD-10-CM | POA: Diagnosis not present

## 2020-09-29 DIAGNOSIS — Z7982 Long term (current) use of aspirin: Secondary | ICD-10-CM

## 2020-09-29 DIAGNOSIS — R404 Transient alteration of awareness: Secondary | ICD-10-CM | POA: Diagnosis not present

## 2020-09-29 DIAGNOSIS — R059 Cough, unspecified: Secondary | ICD-10-CM

## 2020-09-29 DIAGNOSIS — Z83438 Family history of other disorder of lipoprotein metabolism and other lipidemia: Secondary | ICD-10-CM

## 2020-09-29 DIAGNOSIS — Z8673 Personal history of transient ischemic attack (TIA), and cerebral infarction without residual deficits: Secondary | ICD-10-CM | POA: Diagnosis present

## 2020-09-29 DIAGNOSIS — R29818 Other symptoms and signs involving the nervous system: Secondary | ICD-10-CM | POA: Diagnosis not present

## 2020-09-29 DIAGNOSIS — Z8249 Family history of ischemic heart disease and other diseases of the circulatory system: Secondary | ICD-10-CM

## 2020-09-29 DIAGNOSIS — Z888 Allergy status to other drugs, medicaments and biological substances status: Secondary | ICD-10-CM

## 2020-09-29 DIAGNOSIS — N183 Chronic kidney disease, stage 3 unspecified: Secondary | ICD-10-CM | POA: Diagnosis present

## 2020-09-29 HISTORY — DX: Cerebral infarction, unspecified: I63.9

## 2020-09-29 LAB — RAPID URINE DRUG SCREEN, HOSP PERFORMED
Amphetamines: NOT DETECTED
Barbiturates: NOT DETECTED
Benzodiazepines: NOT DETECTED
Cocaine: NOT DETECTED
Opiates: NOT DETECTED
Tetrahydrocannabinol: NOT DETECTED

## 2020-09-29 LAB — URINALYSIS, ROUTINE W REFLEX MICROSCOPIC
Bilirubin Urine: NEGATIVE
Glucose, UA: NEGATIVE mg/dL
Hgb urine dipstick: NEGATIVE
Ketones, ur: NEGATIVE mg/dL
Leukocytes,Ua: NEGATIVE
Nitrite: NEGATIVE
Protein, ur: NEGATIVE mg/dL
Specific Gravity, Urine: 1.006 (ref 1.005–1.030)
pH: 7 (ref 5.0–8.0)

## 2020-09-29 LAB — COMPREHENSIVE METABOLIC PANEL
ALT: 13 U/L (ref 0–44)
AST: 17 U/L (ref 15–41)
Albumin: 3.5 g/dL (ref 3.5–5.0)
Alkaline Phosphatase: 71 U/L (ref 38–126)
Anion gap: 10 (ref 5–15)
BUN: 29 mg/dL — ABNORMAL HIGH (ref 8–23)
CO2: 26 mmol/L (ref 22–32)
Calcium: 8.9 mg/dL (ref 8.9–10.3)
Chloride: 103 mmol/L (ref 98–111)
Creatinine, Ser: 1.54 mg/dL — ABNORMAL HIGH (ref 0.61–1.24)
GFR, Estimated: 45 mL/min — ABNORMAL LOW (ref >60)
Glucose, Bld: 105 mg/dL — ABNORMAL HIGH (ref 70–99)
Potassium: 4.3 mmol/L (ref 3.5–5.1)
Sodium: 139 mmol/L (ref 135–145)
Total Bilirubin: 0.9 mg/dL (ref 0.3–1.2)
Total Protein: 6.9 g/dL (ref 6.5–8.1)

## 2020-09-29 LAB — CBC
HCT: 38 % — ABNORMAL LOW (ref 39.0–52.0)
Hemoglobin: 12 g/dL — ABNORMAL LOW (ref 13.0–17.0)
MCH: 28.8 pg (ref 26.0–34.0)
MCHC: 31.6 g/dL (ref 30.0–36.0)
MCV: 91.3 fL (ref 80.0–100.0)
Platelets: 217 K/uL (ref 150–400)
RBC: 4.16 MIL/uL — ABNORMAL LOW (ref 4.22–5.81)
RDW: 14.3 % (ref 11.5–15.5)
WBC: 6 K/uL (ref 4.0–10.5)
nRBC: 0 % (ref 0.0–0.2)

## 2020-09-29 LAB — DIFFERENTIAL
Abs Immature Granulocytes: 0.02 10*3/uL (ref 0.00–0.07)
Basophils Absolute: 0.1 10*3/uL (ref 0.0–0.1)
Basophils Relative: 1 %
Eosinophils Absolute: 0.1 10*3/uL (ref 0.0–0.5)
Eosinophils Relative: 2 %
Immature Granulocytes: 0 %
Lymphocytes Relative: 13 %
Lymphs Abs: 0.8 10*3/uL (ref 0.7–4.0)
Monocytes Absolute: 0.7 10*3/uL (ref 0.1–1.0)
Monocytes Relative: 11 %
Neutro Abs: 4.4 10*3/uL (ref 1.7–7.7)
Neutrophils Relative %: 73 %

## 2020-09-29 LAB — VITAMIN B12: Vitamin B-12: 578 pg/mL (ref 180–914)

## 2020-09-29 LAB — PROTIME-INR
INR: 1.2 (ref 0.8–1.2)
Prothrombin Time: 14.3 seconds (ref 11.4–15.2)

## 2020-09-29 LAB — APTT: aPTT: 32 seconds (ref 24–36)

## 2020-09-29 LAB — CBG MONITORING, ED: Glucose-Capillary: 93 mg/dL (ref 70–99)

## 2020-09-29 LAB — RESPIRATORY PANEL BY RT PCR (FLU A&B, COVID)
Influenza A by PCR: NEGATIVE
Influenza B by PCR: NEGATIVE
SARS Coronavirus 2 by RT PCR: NEGATIVE

## 2020-09-29 LAB — TSH: TSH: 5.196 u[IU]/mL — ABNORMAL HIGH (ref 0.350–4.500)

## 2020-09-29 LAB — MRSA PCR SCREENING: MRSA by PCR: NEGATIVE

## 2020-09-29 LAB — ETHANOL: Alcohol, Ethyl (B): <10 mg/dL (ref <10)

## 2020-09-29 MED ORDER — ACETAMINOPHEN 160 MG/5ML PO SOLN: 650.0000 mg | ORAL | Status: DC | PRN

## 2020-09-29 MED ORDER — STROKE: EARLY STAGES OF RECOVERY BOOK: Freq: Once | Status: AC

## 2020-09-29 MED ORDER — HEPARIN SODIUM (PORCINE) 5000 UNIT/ML IJ SOLN
5000.0000 [IU] | Freq: Three times a day (TID) | INTRAMUSCULAR | Status: DC
  Administered 2020-09-29: 5000 [IU] via SUBCUTANEOUS
  Filled 2020-09-29: qty 1

## 2020-09-29 MED ORDER — SENNOSIDES-DOCUSATE SODIUM 8.6-50 MG PO TABS: 1.0000 | ORAL_TABLET | Freq: Every evening | ORAL | Status: DC | PRN

## 2020-09-29 MED ORDER — NITROGLYCERIN 0.4 MG SL SUBL: 0.4000 mg | SUBLINGUAL_TABLET | SUBLINGUAL | Status: DC | PRN

## 2020-09-29 MED ORDER — GADOBUTROL 1 MMOL/ML IV SOLN
10.0000 mL | Freq: Once | INTRAVENOUS | Status: AC | PRN
  Administered 2020-09-29: 10 mL via INTRAVENOUS

## 2020-09-29 MED ORDER — FUROSEMIDE 40 MG PO TABS
40.0000 mg | ORAL_TABLET | Freq: Every day | ORAL | Status: DC
  Administered 2020-09-30 – 2020-10-01 (×2): 40 mg via ORAL
  Filled 2020-09-29 (×2): qty 1

## 2020-09-29 MED ORDER — ACETAMINOPHEN 650 MG RE SUPP: 650.0000 mg | RECTAL | Status: DC | PRN

## 2020-09-29 MED ORDER — ASPIRIN 325 MG PO TABS
325.0000 mg | ORAL_TABLET | Freq: Every day | ORAL | Status: DC
  Administered 2020-09-30: 325 mg via ORAL
  Filled 2020-09-29: qty 1

## 2020-09-29 MED ORDER — ACETAMINOPHEN 325 MG PO TABS: 650.0000 mg | ORAL_TABLET | Freq: Three times a day (TID) | ORAL | Status: DC | PRN

## 2020-09-29 MED ORDER — CALCITRIOL 0.25 MCG PO CAPS
0.5000 ug | ORAL_CAPSULE | Freq: Every day | ORAL | Status: DC
  Administered 2020-09-30 – 2020-10-01 (×2): 0.5 ug via ORAL
  Filled 2020-09-29: qty 2
  Filled 2020-09-29 (×2): qty 1
  Filled 2020-09-29: qty 2

## 2020-09-29 MED ORDER — NIACIN ER (ANTIHYPERLIPIDEMIC) 500 MG PO TBCR
500.0000 mg | EXTENDED_RELEASE_TABLET | Freq: Every day | ORAL | Status: DC
  Administered 2020-09-30: 500 mg via ORAL
  Filled 2020-09-29 (×3): qty 1

## 2020-09-29 MED ORDER — SODIUM CHLORIDE 0.9 % IV SOLN: INTRAVENOUS | Status: DC

## 2020-09-29 MED ORDER — HYDROCODONE-ACETAMINOPHEN 5-325 MG PO TABS: 1.0000 | ORAL_TABLET | Freq: Four times a day (QID) | ORAL | Status: DC | PRN

## 2020-09-29 MED ORDER — TRAZODONE HCL 50 MG PO TABS: 25.0000 mg | ORAL_TABLET | Freq: Every evening | ORAL | Status: DC | PRN

## 2020-09-29 MED ORDER — ATORVASTATIN CALCIUM 40 MG PO TABS
40.0000 mg | ORAL_TABLET | Freq: Every evening | ORAL | Status: DC
  Administered 2020-09-29 – 2020-09-30 (×2): 40 mg via ORAL
  Filled 2020-09-29 (×2): qty 1

## 2020-09-29 MED ORDER — STROKE: EARLY STAGES OF RECOVERY BOOK
Freq: Once | Status: DC
  Filled 2020-09-29: qty 1

## 2020-09-29 MED ORDER — ASPIRIN 81 MG PO CHEW: 81.0000 mg | CHEWABLE_TABLET | Freq: Every day | ORAL | Status: DC

## 2020-09-29 MED ORDER — OMEGA-3-ACID ETHYL ESTERS 1 G PO CAPS
1.0000 g | ORAL_CAPSULE | Freq: Two times a day (BID) | ORAL | Status: DC
  Administered 2020-09-30 – 2020-10-01 (×3): 1 g via ORAL
  Filled 2020-09-29 (×7): qty 1

## 2020-09-29 MED ORDER — VITAMIN B-12 1000 MCG PO TABS
500.0000 ug | ORAL_TABLET | Freq: Every day | ORAL | Status: DC
  Administered 2020-09-30 – 2020-10-01 (×2): 500 ug via ORAL
  Filled 2020-09-29 (×2): qty 1

## 2020-09-29 MED ORDER — PANTOPRAZOLE SODIUM 40 MG PO TBEC
40.0000 mg | DELAYED_RELEASE_TABLET | Freq: Every day | ORAL | Status: DC
  Administered 2020-09-30 – 2020-10-01 (×2): 40 mg via ORAL
  Filled 2020-09-29 (×2): qty 1

## 2020-09-29 MED ORDER — ACETAMINOPHEN 325 MG PO TABS: 650.0000 mg | ORAL_TABLET | ORAL | Status: DC | PRN

## 2020-09-29 MED ORDER — CHLORHEXIDINE GLUCONATE CLOTH 2 % EX PADS
6.0000 | MEDICATED_PAD | Freq: Every day | CUTANEOUS | Status: DC
  Administered 2020-09-29 – 2020-09-30 (×2): 6 via TOPICAL

## 2020-09-29 MED ORDER — HEPARIN (PORCINE) 25000 UT/250ML-% IV SOLN
1150.0000 [IU]/h | INTRAVENOUS | Status: DC
  Administered 2020-09-29: 1050 [IU]/h via INTRAVENOUS
  Filled 2020-09-29: qty 250

## 2020-09-29 MED ORDER — POLYETHYLENE GLYCOL 3350 17 G PO PACK
17.0000 g | PACK | Freq: Two times a day (BID) | ORAL | Status: DC
  Administered 2020-09-30 – 2020-10-01 (×2): 17 g via ORAL
  Filled 2020-09-29 (×3): qty 1

## 2020-09-29 NOTE — ED Notes (Signed)
Called [facility] for transport to [facility]. 

## 2020-09-29 NOTE — ED Provider Notes (Signed)
[facility] EMERGENCY DEPARTMENT Provider Note   CSN: [id number] Arrival date & time: 09/29/20  1205     History Chief Complaint  Patient presents with  . Code Stroke    [name] [name] is a 79 y.o. male.  Pt presents to the ED today as a code stroke.  The pt went to the [facility] this am and was fine.  He drove to [employer] then acutely (at 1150) developed right sided paralysis, slurred speech, and right facial droop.  EMS was called who brought him straight here.  Pt's sx have improved en route.  Pt taken directly to the CT scanner.  Pt does have a hx of afib, but is not on anticoagulation due to significant skin bruising and cost concerns.  CHA2DS2/VAS Stroke Risk Points  Current as of 26 minutes ago     4 >= 2 Points: High Risk  1 - 1.99 Points: Medium Risk  0 Points: Low Risk    Last Change: N/A      Details    This score determines the patient's risk of having a stroke if the  patient has atrial fibrillation.       Points Metrics  0 Has Congestive Heart Failure:  No    Current as of 26 minutes ago  1 Has Vascular Disease:  Yes    Current as of 26 minutes ago  1 Has Hypertension:  Yes    Current as of 26 minutes ago  2 Age:  31    Current as of 26 minutes ago  0 Has Diabetes:  No    Current as of 26 minutes ago  0 Had Stroke:  No  Had TIA:  No  Had thromboembolism:  No    Current as of 26 minutes ago  0 Male:  No    Current as of 26 minutes ago                 Past Medical History:  Diagnosis Date  . Arthritis    shoulders and ribs   . Atrial fibrillation ([place])    atrial fib/ LOV [name] PA 04/11/12 EPIC, - CHEST X RAY, EKG 5/13 EPIC  . CAD (coronary artery disease)     s/p NSTEMI 04/08;  [place] 02/2007: Proximal LAD 75%, mid LAD 99%, proximal RCA 25%.  PCI:  Cypher DES to the proximal and mid LAD.  Last nuclear study 11/2011 (after a trip to the ED with CP): EF 58%, low risk study with small inferior wall infarct at the mid and basal level, no ischemia.  Last  echo 01/2010: Mild LVH, EF 55-60%, mild AI, mild MR, severely dilated LA and RA  . Contrast dye induced nephropathy    Hx ARF secondary to contrast nephropathy  . Dyslipidemia   . Dysrhythmia   . Epididymitis   . History of blood transfusion   . Myocardial infarction ([place]) 2008  . Pleurisy    2012  . Pneumonia    hx of   . 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 Spinal stenosis   . Urothelial cancer ([place])    Dr. [name],  skin cancer non melanoma    Patient Active Problem List   Diagnosis Date Noted  . Closed fracture of rib of left side with delayed healing 07/25/2019  . Insomnia 07/04/2019  . Gallstones 05/30/2019  . Compression fracture of L3 lumbar vertebra, with routine healing, subsequent encounter 05/30/2019  . S/P right total knee arthroplasty 04/03/2019  .  Osteoarthritis of right knee 04/03/2019  . Anemia 01/10/2019  . Chest pain 09/12/2018  . Constipation 09/10/2018  . Overweight (BMI 25.0-29.9) 09/06/2018  . S/P left TKA 09/05/2018  . 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 Elevated PSA 09/14/2017  . Primary osteoarthritis of left knee 01/27/2017  . OA (osteoarthritis) of knee 01/26/2017  . Spinal stenosis of lumbar region 12/08/2016  . GERD (gastroesophageal reflux disease) 11/26/2011  . Mixed hyperlipidemia 10/05/2008  . CAD (coronary artery disease) 10/05/2008  . Atrial fibrillation ([place]) 10/05/2008  . CKD (chronic kidney disease), stage III ([place]) 10/05/2008    Past Surgical History:  Procedure Laterality Date  . arm surgery     orif right elbow  . CARDIAC CATHETERIZATION  2008  . coronary stents     . CYSTECTOMY  07/15/08  . CYSTOSCOPY WITH BIOPSY  03/23/2012   Procedure: CYSTOSCOPY WITH BIOPSY;  Surgeon: [name], MD;  Location: [facility];  Service: Urology;  Laterality: Right;   BRUSH BIOPSY RIGHT URETERAL STENT  . CYSTOSCOPY WITH URETEROSCOPY  03/23/2012   Procedure: CYSTOSCOPY WITH URETEROSCOPY;  Surgeon: [name], MD;  Location: [facility];  Service: Urology;   Laterality: Right;    **OR Room #8 requested**  C-ARM   . CYSTOSCOPY/RETROGRADE/URETEROSCOPY  02/15/2012   Procedure: CYSTOSCOPY/RETROGRADE/URETEROSCOPY;  Surgeon: [name], MD;  Location: [facility];  Service: Urology;  Laterality: Right;  C-ARM  . JOINT REPLACEMENT     Dr. [name] 09-05-18   . left pinky finger      tip removed from accident  . right kidney removed      2013  . TOTAL KNEE ARTHROPLASTY Left 09/05/2018   Procedure: LEFT TOTAL KNEE ARTHROPLASTY;  Surgeon: [name], MD;  Location: [facility];  Service: Orthopedics;  Laterality: Left;  70 mins with abductor block  . TOTAL KNEE ARTHROPLASTY Right 04/03/2019   Procedure: RIGHT TOTAL KNEE ARTHROPLASTY;  Surgeon: [name], MD;  Location: [facility];  Service: Orthopedics;  Laterality: Right;  70 mins  . URETER SURGERY         Family History  Problem Relation Age of Onset  . Cancer Mother   . Diabetes type II Mother   . Hyperlipidemia Mother   . Coronary artery disease Father   . Heart attack Father     Social History   Tobacco Use  . Smoking status: Former Smoker    Packs/day: 2.00    Years: 10.00    Pack years: 20.00    Types: Cigarettes, Cigars    Quit date: 11/15/1981    Years since quitting: 38.8  . Smokeless tobacco: Never Used  Vaping Use  . Vaping Use: Never used  Substance Use Topics  . Alcohol use: Not Currently    Comment: SOCIAL  . Drug use: No    Home Medications Prior to Admission medications   Medication Sig Start Date End Date Taking? Authorizing Provider  atorvastatin (LIPITOR) 40 MG tablet TAKE 1 TABLET EVERY EVENING 01/07/20   [place], [name], MD  calcitRIOL (ROCALTROL) 0.5 MCG capsule Take 0.5 mcg by mouth daily.    [provider]  furosemide (LASIX) 40 MG tablet TAKE 1 TABLET EVERY DAY 01/07/20   [place], [name], MD  HYDROcodone-acetaminophen [facility]) 5-325 MG tablet Take 1 tablet by mouth every 6 (six) hours as needed for moderate pain. 04/11/20   Alycia Rossetti, MD   Inositol Niacinate (NIACIN FLUSH FREE) 500 MG CAPS Take 500 mg by mouth daily.     [provider]  niacin (NIASPAN) 500 MG CR tablet Take 1 tablet (500 mg total) by mouth at bedtime. 04/11/20   [place], [name], MD  nitroGLYCERIN (NITROSTAT) 0.4 MG SL tablet Place 1 tablet (0.4 mg total) under the tongue every 5 (five) minutes as needed for chest pain. 05/31/19   [place], [name], MD  Omega-3 Fatty Acids (FISH OIL) 1200 MG CAPS Take 1,200 mg by mouth daily.     [provider]  polyethylene glycol (MIRALAX / GLYCOLAX) 17 g packet Take 17 g by mouth 2 (two) times daily. 04/04/19   Danae Orleans, PA-C  traZODone (DESYREL) 50 MG tablet Take 0.5-1 tablets (25-50 mg total) by mouth at bedtime as needed for sleep. 06/26/20   [place], [name], MD  vitamin B-12 (CYANOCOBALAMIN) 500 MCG tablet Take 500 mcg by mouth daily.    [provider]    Allergies    Contrast media [iodinated diagnostic agents], Iodine-131, Other, and Promethazine  Review of Systems   Review of Systems  Neurological:       Right arm, leg, and facial weakness.  Slurred speech.  All other systems reviewed and are negative.   Physical Exam Updated Vital Signs BP (!) 171/85   Pulse 68   Temp 98.5 F (36.9 C) (Oral)   Resp 15   Ht 6' (1.829 m)   Wt 85.7 kg   SpO2 98%   BMI 25.62 kg/m   Physical Exam Vitals and nursing note reviewed.  Constitutional:      Appearance: Normal appearance.  HENT:     Head: Atraumatic.     Comments: Mild right eyelid droop    Right Ear: External ear normal.     Left Ear: External ear normal.     Nose: Nose normal.     Mouth/Throat:     Mouth: Mucous membranes are moist.  Eyes:     Extraocular Movements: Extraocular movements intact.     Conjunctiva/sclera: Conjunctivae normal.     Pupils: Pupils are equal, round, and reactive to light.  Cardiovascular:     Rate and Rhythm: Normal rate. Rhythm irregular.     Pulses: Normal pulses.     Heart sounds:  Normal heart sounds.  Pulmonary:     Effort: Pulmonary effort is normal.     Breath sounds: Normal breath sounds.  Abdominal:     General: Abdomen is flat. Bowel sounds are normal.     Palpations: Abdomen is soft.  Musculoskeletal:        General: Normal range of motion.     Cervical back: Normal range of motion and neck supple.  Skin:    General: Skin is warm.     Capillary Refill: Capillary refill takes less than 2 seconds.  Neurological:     Mental Status: He is alert and oriented to person, place, and time.     Comments: Mild weakness to right arm and right leg  Psychiatric:        Mood and Affect: Mood normal.        Behavior: Behavior normal.     ED Results / Procedures / Treatments   Labs (all labs ordered are listed, but only abnormal results are displayed) Labs Reviewed  CBC - Abnormal; Notable for the following components:      Result Value   RBC 4.16 (*)    Hemoglobin 12.0 (*)    HCT 38.0 (*)    All other components within normal limits  COMPREHENSIVE METABOLIC PANEL - Abnormal;  Notable for the following components:   Glucose, Bld 105 (*)    BUN 29 (*)    Creatinine, Ser 1.54 (*)    GFR, Estimated 45 (*)    All other components within normal limits  ETHANOL  PROTIME-INR  APTT  DIFFERENTIAL  RAPID URINE DRUG SCREEN, HOSP PERFORMED  URINALYSIS, ROUTINE W REFLEX MICROSCOPIC  CBG MONITORING, ED  I-STAT CHEM 8, ED    EKG EKG Interpretation  Date/Time:  Monday September 29 2020 12:41:51 EST Ventricular Rate:  67 PR Interval:    QRS Duration: 92 QT Interval:  427 QTC Calculation: 451 R Axis:   28 Text Interpretation: Atrial fibrillation Ventricular premature complex Low voltage, extremity leads Probable anteroseptal infarct, old No significant change since last tracing Confirmed by Isla Pence [phone]) on 09/29/2020 1:33:10 PM   Radiology CT HEAD CODE STROKE WO CONTRAST  Result Date: 09/29/2020 CLINICAL DATA:  Code stroke. Neuro deficit, acute  stroke suspected. Right-sided weakness. EXAM: CT HEAD WITHOUT CONTRAST TECHNIQUE: Contiguous axial images were obtained from the base of the skull through the vertex without intravenous contrast. COMPARISON:  None. FINDINGS: Brain: No evidence of acute large vascular territory infarction, hemorrhage, hydrocephalus, extra-axial collection or mass lesion/mass effect. Patchy white matter hypoattenuation is nonspecific, but most likely secondary to chronic microvascular ischemic disease. Mild generalized atrophy with ex vacuo ventricular dilation. Vascular: No hyperdense vessel or unexpected calcification. Calcific atherosclerosis. Skull: No acute fracture. Sinuses/Orbits: No acute finding. Other: No mastoid effusions. ASPECTS [facility] Stroke Program Early CT Score) total score (0-10 with 10 being normal): 10 IMPRESSION: 1. No evidence of acute intracranial abnormality.   ASPECTS is 10. 2. 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 Code stroke imaging results were communicated on 09/29/2020 at 12:27 pm to provider Dr. Gilford Raid via telephone, who verbally acknowledged these results. Electronically Signed   By: Margaretha Sheffield MD   On: 09/29/2020 12:30    Procedures Procedures (including critical care time)  Medications Ordered in ED Medications  gadobutrol (GADAVIST) 1 MMOL/ML injection 10 mL (10 mLs Intravenous Contrast Given 09/29/20 1317)    ED Course  I have reviewed the triage vital signs and the nursing notes.  Pertinent labs & imaging results that were available during my care of the patient were reviewed by me and considered in my medical decision making (see chart for details).    MDM Rules/Calculators/A&P                          Teleneurology (Dr. Curly Shores) saw pt.  She recommends admission for TIA and MRI of brain and MRI brain/neck.    Pt is not a candidate for tpa as his sx have resolved.  Pt d/w Dr. Dyann Kief (triad) for admission.  CRITICAL CARE Performed by:  Isla Pence   Total critical care time: 30 minutes  Critical care time was exclusive of separately billable procedures and treating other patients.  Critical care was necessary to treat or prevent imminent or life-threatening deterioration.  Critical care was time spent personally by me on the following activities: development of treatment plan with patient and/or surrogate as well as nursing, discussions with consultants, evaluation of patient's response to treatment, examination of patient, obtaining history from patient or surrogate, ordering and performing treatments and interventions, ordering and review of laboratory studies, ordering and review of radiographic studies, pulse oximetry and re-evaluation of patient's condition.  Final Clinical Impression(s) / ED Diagnoses Final diagnoses:  TIA (transient ischemic attack)  Chronic  atrial fibrillation [facility])    Rx / DC Orders ED Discharge Orders    None       Isla Pence, MD 10/09/20 [phone]

## 2020-09-29 NOTE — H&P (Addendum)
History and Physical    [name] DXI:[id number] DOB: 01-22-1941 DOA: 09/29/2020  PCP: Alycia Rossetti, MD   Patient coming from: Home  I have personally briefly reviewed patient's old medical records in [place]  Chief Complaint: Right-sided weakness, dysarthria and facial droop.  HPI: [name] [name] is a 79 y.o. male with medical history significant of hypertension, hyperlipidemia, chronic atrial fibrillation, gastroesophageal reflux disease and chronic osteoarthritis; who presented to the hospital secondary to acute sudden onset of dysarthria, right-sided weakness and facial droop.  Symptoms rapidly improved by the time he was in the emergency department.  No focal deficits has remained.  Patient expressed no Protonix symptoms.  Denies chest pain, fever, nausea, vomiting, abdominal pain, palpitations, dysuria, hematuria, sick contacts or any other complaints.  ED Course: CT head negative for acute intracranial normalities; due to rapid resolution of patient's symptoms decision was made to not pursuit TPA.  Neurology was consulted and recommended admission for stroke work-up.  TR[facility] has been contacted to facilitate hospital placement for further evaluation and management.  Of note, patient has passed swallowing evaluation by the time of my interview.  Review of Systems: As per HPI otherwise all other systems reviewed and are negative.   Past Medical History:  Diagnosis Date  . Arthritis    shoulders and ribs   . Atrial fibrillation ([place])    atrial fib/ LOV [name] PA 04/11/12 EPIC, - CHEST X RAY, EKG 5/13 EPIC  . CAD (coronary artery disease)     s/p NSTEMI 04/08;  [place] 02/2007: Proximal LAD 75%, mid LAD 99%, proximal RCA 25%.  PCI:  Cypher DES to the proximal and mid LAD.  Last nuclear study 11/2011 (after a trip to the ED with CP): EF 58%, low risk study with small inferior wall infarct at the mid and basal level, no ischemia.  Last echo 01/2010: Mild LVH, EF 55-60%, mild AI, mild  MR, severely dilated LA and RA  . Contrast dye induced nephropathy    Hx ARF secondary to contrast nephropathy  . Dyslipidemia   . Dysrhythmia   . Epididymitis   . History of blood transfusion   . Myocardial infarction ([place]) 2008  . Pleurisy    2012  . Pneumonia    hx of   . 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 Spinal stenosis   . 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 [name],  skin cancer non melanoma    Past Surgical History:  Procedure Laterality Date  . arm surgery     orif right elbow  . CARDIAC CATHETERIZATION  2008  . coronary stents     . CYSTECTOMY  07/15/08  . CYSTOSCOPY WITH BIOPSY  03/23/2012   Procedure: CYSTOSCOPY WITH BIOPSY;  Surgeon: [name], MD;  Location: [facility];  Service: Urology;  Laterality: Right;   BRUSH BIOPSY RIGHT URETERAL STENT  . CYSTOSCOPY WITH URETEROSCOPY  03/23/2012   Procedure: CYSTOSCOPY WITH URETEROSCOPY;  Surgeon: [name], MD;  Location: [facility];  Service: Urology;  Laterality: Right;    **OR Room #8 requested**  C-ARM   . CYSTOSCOPY/RETROGRADE/URETEROSCOPY  02/15/2012   Procedure: CYSTOSCOPY/RETROGRADE/URETEROSCOPY;  Surgeon: [name], MD;  Location: [facility];  Service: Urology;  Laterality: Right;  C-ARM  . JOINT REPLACEMENT     Dr. [name] 09-05-18   . left pinky finger      tip removed from accident  . right kidney removed      2013  . TOTAL  KNEE ARTHROPLASTY Left 09/05/2018   Procedure: LEFT TOTAL KNEE ARTHROPLASTY;  Surgeon: [name], MD;  Location: [facility];  Service: Orthopedics;  Laterality: Left;  70 mins with abductor block  . TOTAL KNEE ARTHROPLASTY Right 04/03/2019   Procedure: RIGHT TOTAL KNEE ARTHROPLASTY;  Surgeon: [name], MD;  Location: [facility];  Service: Orthopedics;  Laterality: Right;  70 mins  . URETER SURGERY      Social History  reports that he quit smoking about 38 years ago. His smoking use included cigarettes and cigars. He has a 20.00 pack-year smoking history. He has never used smokeless tobacco. He reports  previous alcohol use. He reports that he does not use drugs.  Allergies  Allergen Reactions  . Contrast Media [Iodinated Diagnostic Agents]     Contrast-induced nephropathy requiring dialysis in 02/2007.  . Iodine-131 Other (See Comments)    Kidneys shut down  . Other Other (See Comments)    Beta blockers cause bradycardia  . Promethazine Other (See Comments)    Pt became drowsy and mild altered mental status    Family History  Problem Relation Age of Onset  . Cancer Mother   . Diabetes type II Mother   . Hyperlipidemia Mother   . Coronary artery disease Father   . Heart attack Father     Prior to Admission medications   Medication Sig Start Date End Date Taking? Authorizing Provider  acetaminophen (TYLENOL) 650 MG CR tablet Take 650 mg by mouth every 8 (eight) hours as needed for pain.   Yes [provider]  aspirin 81 MG chewable tablet Chew 81 mg by mouth daily.   Yes [provider]  atorvastatin (LIPITOR) 40 MG tablet TAKE 1 TABLET EVERY EVENING 01/07/20  Yes [place], [name], MD  calcitRIOL (ROCALTROL) 0.5 MCG capsule Take 0.5 mcg by mouth daily.   Yes [provider]  furosemide (LASIX) 40 MG tablet TAKE 1 TABLET EVERY DAY 01/07/20  Yes [place], [name], MD  HYDROcodone-acetaminophen (NORCO) 5-325 MG tablet Take 1 tablet by mouth every 6 (six) hours as needed for moderate pain. 04/11/20  Yes [place], [name], MD  niacin (NIASPAN) 500 MG CR tablet Take 1 tablet (500 mg total) by mouth at bedtime. 04/11/20  Yes [place], [name], MD  nitroGLYCERIN (NITROSTAT) 0.4 MG SL tablet Place 1 tablet (0.4 mg total) under the tongue every 5 (five) minutes as needed for chest pain. 05/31/19  Yes [place], [name], MD  Omega-3 Fatty Acids (FISH OIL) 1200 MG CAPS Take 1,200 mg by mouth daily.    Yes [provider]  polyethylene glycol (MIRALAX / GLYCOLAX) 17 g packet Take 17 g by mouth 2 (two) times daily. 04/04/19  Yes Babish, Rodman Key, PA-C  traZODone  (DESYREL) 50 MG tablet Take 0.5-1 tablets (25-50 mg total) by mouth at bedtime as needed for sleep. 06/26/20  Yes [place], [name], MD  vitamin B-12 (CYANOCOBALAMIN) 500 MCG tablet Take 500 mcg by mouth daily.   Yes [provider]    Physical Exam: Vitals:   09/29/20 1300 09/29/20 1356 09/29/20 1415 09/29/20 1500  BP: (!) 158/82 (!) 175/93 (!) 164/84 (!) 165/92  Pulse: 62 61 63 62  Resp: (!) 21 16 12 15   Temp:  (!) 97.5 F (36.4 C)    TempSrc:  Oral    SpO2: 100% 100% 100% 100%  Weight:      Height:        Constitutional: In no major distress; following commands appropriately  and denying chest pain, shortness of breath or any deficits at time of my evaluation. Vitals:   09/29/20 1300 09/29/20 1356 09/29/20 1415 09/29/20 1500  BP: (!) 158/82 (!) 175/93 (!) 164/84 (!) 165/92  Pulse: 62 61 63 62  Resp: (!) 21 16 12 15   Temp:  (!) 97.5 F (36.4 C)    TempSrc:  Oral    SpO2: 100% 100% 100% 100%  Weight:      Height:       Eyes: PERRL, lids and conjunctivae normal ENMT: Mucous membranes are moist. Posterior pharynx clear of any exudate or lesions. Neck: normal, supple, no masses, no thyromegaly Respiratory: clear to auscultation bilaterally, no wheezing, no crackles. Normal respiratory effort. No accessory muscle use.  Good oxygen saturation on room air. Cardiovascular: Regular rate; irregular rhythm.  No rubs, no gallops, no JVD.  Trace edema appreciated bilaterally. Abdomen: no tenderness, no masses palpated. No hepatosplenomegaly. Bowel sounds positive.  Musculoskeletal: no clubbing / cyanosis. No joint deformity upper and lower extremities. Good ROM, no contractures. Normal muscle tone.  Skin: no rashes, lesions, ulcers. No induration Neurologic: CN 2-12 grossly intact. Sensation intact, DTR normal. Strength 5/5 in all 4.  Psychiatric: Normal judgment and insight. Alert and oriented x 3. Normal mood.   Labs on Admission: I have personally reviewed following labs and  imaging studies  CBC: Recent Labs  Lab 09/29/20 1231  WBC 6.0  NEUTROABS 4.4  HGB 12.0*  HCT 38.0*  MCV 91.3  PLT 665    Basic Metabolic Panel: Recent Labs  Lab 09/29/20 1231  NA 139  K 4.3  CL 103  CO2 26  GLUCOSE 105*  BUN 29*  CREATININE 1.54*  CALCIUM 8.9    GFR: Estimated Creatinine Clearance: 41.3 mL/min (A) (by C-G formula based on SCr of 1.54 mg/dL (H)).  Liver Function Tests: Recent Labs  Lab 09/29/20 1231  AST 17  ALT 13  ALKPHOS 71  BILITOT 0.9  PROT 6.9  ALBUMIN 3.5    Urine analysis:    Component Value Date/Time   COLORURINE STRAW (A) 09/29/2020 1454   APPEARANCEUR CLEAR 09/29/2020 1454   LABSPEC 1.006 09/29/2020 1454   PHURINE 7.0 09/29/2020 1454   GLUCOSEU NEGATIVE 09/29/2020 1454   HGBUR NEGATIVE 09/29/2020 1454   BILIRUBINUR NEGATIVE 09/29/2020 1454   KETONESUR NEGATIVE 09/29/2020 1454   PROTEINUR NEGATIVE 09/29/2020 1454   NITRITE NEGATIVE 09/29/2020 1454   LEUKOCYTESUR NEGATIVE 09/29/2020 1454    Radiological Exams on Admission: CT HEAD CODE STROKE WO CONTRAST  Result Date: 09/29/2020 CLINICAL DATA:  Code stroke. Neuro deficit, acute stroke suspected. Right-sided weakness. EXAM: CT HEAD WITHOUT CONTRAST TECHNIQUE: Contiguous axial images were obtained from the base of the skull through the vertex without intravenous contrast. COMPARISON:  None. FINDINGS: Brain: No evidence of acute large vascular territory infarction, hemorrhage, hydrocephalus, extra-axial collection or mass lesion/mass effect. Patchy white matter hypoattenuation is nonspecific, but most likely secondary to chronic microvascular ischemic disease. Mild generalized atrophy with ex vacuo ventricular dilation. Vascular: No hyperdense vessel or unexpected calcification. Calcific atherosclerosis. Skull: No acute fracture. Sinuses/Orbits: No acute finding. Other: No mastoid effusions. ASPECTS [facility] Stroke Program Early CT Score) total score (0-10 with 10 being normal): 10  IMPRESSION: 1. No evidence of acute intracranial abnormality.   ASPECTS is 10. 2. 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 Code stroke imaging results were communicated on 09/29/2020 at 12:27 pm to provider Dr. Gilford Raid via telephone, who verbally acknowledged these results. Electronically Signed  By: Margaretha Sheffield MD   On: 09/29/2020 12:30    EKG: Independently reviewed.  No acute ischemic changes appreciated; rate controlled.  Atrial fibrillation appreciated.  Assessment/Plan 1-strokelike symptoms and/TIA (transient ischemic attack) -On presentation patient with dysarthria, facial droop and right-sided upper and lower extremity weakness. -Symptoms completely resolved by the time of my evaluation. -CT scan negative for acute intracranial abnormality -Patient with risk factors including hypertension, hyperlipidemia, age and atrial fibrillation without secondary prevention. -he was using aspirin prior to admission. -Neurology was consulted and recommended admission to neuro ICU for close neurologic exam/monitoring; abnormal MRI of the brain and need for further stroke work-up. -Will check 2-d echo, A1c, lipid panel, TSH and B12. -Patient passed swallowing screen and heart healthy diet has been ordered. -per discussion with neurology plan is for him to be started on heparin drip after arrival to ICU.  2-Mixed hyperlipidemia -Will check lipid panel -Continue niacin, Lovaza and Lipitor.  3-Atrial fibrillation (HCC) -Chronic and permanent -Rate controlled -Asymptomatic and denying palpitations. -Not on anticoagulation prior to admission. -Monitor on telemetry.  4-CKD (chronic kidney disease), stage III ([place]) -Stage III a at baseline -Appears to be stable -Will follow renal function electrolytes trend.  5-GERD (gastroesophageal reflux disease) -Continue PPI.  6-hypertension -Allow permissive hypertension in the setting of ischemic work-up.  7-chronic  osteoarthritis -Continue as needed Tylenol and hydrocodone.  8-insomnia -Continue trazodone as needed.   DVT prophylaxis:  Code Status:   Full code. Family Communication:  Wife at bedside. Disposition Plan:   Patient is from:  Home  Anticipated DC to:  Home most likely.  Anticipated DC date:  09/30/20  Anticipated DC barriers: Stroke work-up completion.  Consults called:  Neurology service (Dr. Cheral Marker) Admission status:  Observation, telemetry, length of stay less than 2 midnights.  Severity of Illness: moderate severity; patient presented with what we thought was just a TIA.  MRI positive for multiple acute ischemic strokes and per neurology high concerns for deterioration; will admit to [facility] neuro-ICU under neurology service for further evaluation and management.      Barton Dubois MD [facility]  How to contact the [facility] Attending or Consulting provider [place] or covering provider during after hours [place], for this patient?   1. Check the care team in [facility] and look for a) attending/consulting [facility] provider listed and b) the [facility] team listed 2. Log into [email or web address] and use [place]'s universal password to access. If you do not have the password, please contact the hospital operator. 3. Locate the [facility] provider you are looking for under [facility] and page to a number that you can be directly reached. 4. If you still have difficulty reaching the provider, please page the [facility] (Director on Call) for the Hospitalists listed on amion for assistance.  09/29/2020, 4:14 PM

## 2020-09-29 NOTE — ED Notes (Signed)
Pt not in room, pt is currently in MRI, wife at bedside.

## 2020-09-29 NOTE — Progress Notes (Signed)
Received patient information and details in signout. Seen by telemetry neurology.  Left MCA stroke.  Left M2-mid M2 signal loss likely reflecting intraluminal thrombus.  High-grade proximal separate M2 left branch stenosis. History of A. fib-on heparin drip. Accepted by neuro hospitalist service to be admitted to the ICU under the stroke service for closer monitoring should he worsen and require intervention. 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  Telemedicine neurologist had detailed discussion about requiring administration of contrast material for doing a CT perfusion and CT angiogram should he worsen to be able to make a decision for intervention. I will evaluate the patient when he arrives.  I have placed the order for admission to the ICU as had been discussed by my team earlier. My recommendations will follow once I get to see the patient.  -- Amie Portland, MD [facility] Pager: [phone] If 7pm to 7am, please call on call as listed on AMION.

## 2020-09-29 NOTE — ED Triage Notes (Signed)
EMS reports pt's wife says pt was lkw at 1145 at the [facility].  Reports went to [facility] and suddenly at 1150 had r side paralysis, r sided facial droop, and unable to speak.

## 2020-09-29 NOTE — Progress Notes (Addendum)
ANTICOAGULATION CONSULT NOTE - Initial Consult  Pharmacy Consult for IV Heparin Indication: Stroke, Atrial Fibrillation  Allergies  Allergen Reactions  . Contrast Media [Iodinated Diagnostic Agents]     Contrast-induced nephropathy requiring dialysis in 02/2007.  . Iodine-131 Other (See Comments)    Kidneys shut down  . Other Other (See Comments)    Beta blockers cause bradycardia  . Promethazine Other (See Comments)    Pt became drowsy and mild altered mental status    Patient Measurements: Height: 6' (182.9 cm) Weight: 85.7 kg (188 lb 15 oz) IBW/kg (Calculated) : 77.6 Heparin Dosing Weight: 85.7 kg  Vital Signs: Temp: 97.5 F (36.4 C) (11/15 1356) Temp Source: Oral (11/15 1356) BP: 168/88 (11/15 1800) Pulse Rate: 62 (11/15 1800)  Labs: Recent Labs    09/29/20 1231  HGB 12.0*  HCT 38.0*  PLT 217  APTT 32  LABPROT 14.3  INR 1.2  CREATININE 1.54*    Estimated Creatinine Clearance: 41.3 mL/min (A) (by C-G formula based on SCr of 1.54 mg/dL (H)).   Medical History: Past Medical History:  Diagnosis Date  . Arthritis    shoulders and ribs   . Atrial fibrillation ([place])    atrial fib/ LOV [name] PA 04/11/12 EPIC, - CHEST X RAY, EKG 5/13 EPIC  . CAD (coronary artery disease)     s/p NSTEMI 04/08;  [place] 02/2007: Proximal LAD 75%, mid LAD 99%, proximal RCA 25%.  PCI:  Cypher DES to the proximal and mid LAD.  Last nuclear study 11/2011 (after a trip to the ED with CP): EF 58%, low risk study with small inferior wall infarct at the mid and basal level, no ischemia.  Last echo 01/2010: Mild LVH, EF 55-60%, mild AI, mild MR, severely dilated LA and RA  . Contrast dye induced nephropathy    Hx ARF secondary to contrast nephropathy  . Dyslipidemia   . Dysrhythmia   . Epididymitis   . History of blood transfusion   . Myocardial infarction ([place]) 2008  . Pleurisy    2012  . Pneumonia    hx of   . 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 Spinal stenosis   . Urothelial cancer ([place])     Dr. [name],  skin cancer non melanoma    Assessment: 79 yr old man presented to [facility] ED as a code stroke. Head CT was negative for acute intracranial abnormalities; pt's sx resolved rapidly. MRI showed small acute infarcts in posterior L frontal lobe white matter and L insular cortex; MRA with concerns of endoluminal thrombus. Pt has hx of atrial fibrillation, but was no on anticoagulant PTA due to hx of significant skin bruising and cost concerns. Pharmacy was consulted to dose IV heparin (no bolus).  Pt rec'd dose of heparin 5000 units [place] at ~1500 PM this afternoon.  H/H 12,0/38.0, platelets 217, INR 1.2, aPTT 32 sec; Scr 1.54  Goal of Therapy:  Heparin level 0.3-0.5 units/ml Monitor platelets by anticoagulation protocol: Yes   Plan:  Start heparin infusion at 1050 units/hr, per stroke protocol Check 8-hr heparin level Monitor daily heparin level, CBC Monitor for signs/symptoms of bleeding  Gillermina Hu, PharmD, BCPS, [facility] Clinical Pharmacist 09/29/2020,6:14 PM

## 2020-09-29 NOTE — H&P (Signed)
Neurology Consultation  CC: Word finding difficulty, right-sided weakness  History is obtained from: Chart review, patient  HPI: [name] [name] is a 79 y.o. male atrial fibrillation not on anticoagulation, dyslipidemia, coronary artery disease, history of myocardial infarction, dyslipidemia, presenting to the emergency room at [facility] this afternoon and was seen by telemedicine neurology-see Dr. Lyn Records note from earlier in the day. Patient given with complaints of difficulty communicating and severe right face arm and leg weakness.  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  He was given his usual order by the server but he could barely walk or hold his coffee because of severe right-sided weakness.  When he was seen by telemedicine neurology, symptoms are completely resolved-NIH 0. MR brain was done and showed small acute infarct within the posterior left frontal lobe and left insular cortex.  Small chronic left basal ganglia lacunar infarct seen. MRA head showed occlusion of proximal to mid M2 left MCA branch with T2 star signal loss Likely reflecting intraluminal thrombus.  High-grade stenosis proximally within a separate left MCA M2 branch.  High-grade stenosis of bilateral PCAs at P3 P4 junctions-might be artifactual. 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 Patient started on heparin drip due to fluctuating symptoms and presence of a possible intraluminal thrombus in the M2 and transferred to [facility]-accepted by neuro hospitalist to be admitted to the stroke service in the neurological ICU. Currently remains asymptomatic.  No complaints.  Was able to provide history. See detailed exam below.  Has a history of contrast-induced nephropathy when he got contrast for cardiac cath.  Has been listed as allergy in his chart.  Confirmed that it was nephropathy.  Amenable to proceed with CT perfusion showed symptoms worsen  and consideration for endovascular thrombectomy be made.  Detailed discussion with the telemedicine neurologist and family-documented in the consult note from earlier this afternoon.  LKW: 11:50 AM 09/29/2020 tpa given?: no, NIH 0 Premorbid modified Rankin scale (mRS):0  ROS: Performed and negative except as noted in HPI Past Medical History:  Diagnosis Date  . Arthritis    shoulders and ribs   . Atrial fibrillation ([place])    atrial fib/ LOV [name] PA 04/11/12 EPIC, - CHEST X RAY, EKG 5/13 EPIC  . CAD (coronary artery disease)     s/p NSTEMI 04/08;  [place] 02/2007: Proximal LAD 75%, mid LAD 99%, proximal RCA 25%.  PCI:  Cypher DES to the proximal and mid LAD.  Last nuclear study 11/2011 (after a trip to the ED with CP): EF 58%, low risk study with small inferior wall infarct at the mid and basal level, no ischemia.  Last echo 01/2010: Mild LVH, EF 55-60%, mild AI, mild MR, severely dilated LA and RA  . Contrast dye induced nephropathy    Hx ARF secondary to contrast nephropathy  . Dyslipidemia   . Dysrhythmia   . Epididymitis   . History of blood transfusion   . Myocardial infarction ([place]) 2008  . Pleurisy    2012  . Pneumonia    hx of   . 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 Spinal stenosis   . 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 [name],  skin cancer non melanoma    Family History  Problem Relation Age of Onset  . Cancer Mother   . Diabetes type II Mother   . Hyperlipidemia Mother   . Coronary artery  disease Father   . Heart attack Father     Social History:   reports that he quit smoking about 38 years ago. His smoking use included cigarettes and cigars. He has a 20.00 pack-year smoking history. He has never used smokeless tobacco. He reports previous alcohol use. He reports that he does not use drugs.  Medications  Current Facility-Administered Medications:  .   stroke: mapping our early stages of recovery book, , Does not apply, Once, Barton Dubois, MD .   stroke: mapping our early  stages of recovery book, , Does not apply, Once, Amie Portland, MD .  0.9 %  sodium chloride infusion, , Intravenous, Continuous, Barton Dubois, MD, Last Rate: 50 mL/hr at 09/29/20 1809, New Bag at 09/29/20 1809 .  0.9 %  sodium chloride infusion, , Intravenous, Continuous, Amie Portland, MD .  acetaminophen (TYLENOL) tablet 650 mg, 650 mg, Oral, Q4H PRN **OR** acetaminophen (TYLENOL) 160 MG/5ML solution 650 mg, 650 mg, Per Tube, Q4H PRN **OR** acetaminophen (TYLENOL) suppository 650 mg, 650 mg, Rectal, Q4H PRN, Amie Portland, MD .  acetaminophen (TYLENOL) tablet 650 mg, 650 mg, Oral, Q8H PRN, Barton Dubois, MD .  aspirin tablet 325 mg, 325 mg, Oral, Daily, Barton Dubois, MD .  atorvastatin (LIPITOR) tablet 40 mg, 40 mg, Oral, QPM, Barton Dubois, MD, 40 mg at 09/29/20 1808 .  [START ON 09/30/2020] calcitRIOL (ROCALTROL) capsule 0.5 mcg, 0.5 mcg, Oral, Daily, Barton Dubois, MD .  Chlorhexidine Gluconate Cloth 2 % PADS 6 each, 6 each, Topical, Daily, Amie Portland, MD .  Derrill Memo ON 09/30/2020] furosemide (LASIX) tablet 40 mg, 40 mg, Oral, Daily, Barton Dubois, MD .  heparin ADULT infusion 100 units/mL (25000 units/224mL sodium chloride 0.45%), 1,050 Units/hr, Intravenous, Continuous, Barton Dubois, MD, Last Rate: 10.5 mL/hr at 09/29/20 1851, 1,050 Units/hr at 09/29/20 1851 .  HYDROcodone-acetaminophen (NORCO/VICODIN) 5-325 MG per tablet 1 tablet, 1 tablet, Oral, Q6H PRN, Barton Dubois, MD .  Derrill Memo ON 09/30/2020] niacin (NIASPAN) CR tablet 500 mg, 500 mg, Oral, QHS, Barton Dubois, MD .  nitroGLYCERIN (NITROSTAT) SL tablet 0.4 mg, 0.4 mg, Sublingual, Q5 min PRN, Barton Dubois, MD .  omega-3 acid ethyl esters (LOVAZA) capsule 1 g, 1 g, Oral, BID, Barton Dubois, MD .  pantoprazole (PROTONIX) EC tablet 40 mg, 40 mg, Oral, Daily, Barton Dubois, MD .  polyethylene glycol (MIRALAX / GLYCOLAX) packet 17 g, 17 g, Oral, BID, Barton Dubois, MD .  senna-docusate (Senokot-S) tablet 1 tablet, 1 tablet,  Oral, QHS PRN, Amie Portland, MD .  traZODone (DESYREL) tablet 25-50 mg, 25-50 mg, Oral, QHS PRN, Barton Dubois, MD .  Derrill Memo ON 09/30/2020] vitamin B-12 (CYANOCOBALAMIN) tablet 500 mcg, 500 mcg, Oral, Daily, Barton Dubois, MD   Exam: Current vital signs: BP (!) 157/83   Pulse 62   Temp 98.3 F (36.8 C) (Oral)   Resp 19   Ht 6' (1.829 m)   Wt 85.7 kg   SpO2 100%   BMI 25.62 kg/m  Vital signs in last 24 hours: Temp:  [97.5 F (36.4 C)-98.5 F (36.9 C)] 98.3 F (36.8 C) (11/15 2056) Pulse Rate:  [49-75] 62 (11/15 2100) Resp:  [11-21] 19 (11/15 2100) BP: (146-175)/(73-116) 157/83 (11/15 2103) SpO2:  [97 %-100 %] 100 % (11/15 2100) Weight:  [85.7 kg] 85.7 kg (11/15 1255)  NIH stroke scale 0 General: Awake alert in no distress HEENT: Normocephalic atraumatic Lungs: Clear to auscultation Cardiovascular: S1-S2 heard regular rate rhythm Abdomen nondistended nontender Extremities warm well perfused Neurological exam Awake  alert oriented x3 Mildly reduced attention concentration No dysarthria No evidence of gross aphasia Cranial nerves: Pupils equal round react light, extraocular wounds intact, visual fields are full, facial sensation is intact, face is symmetric, auditory acuity is mildly reduced bilaterally, tongue and palate are midline, shoulder shrug intact. Motor exam: No drift in any of the 4 extremities. Sensation intact to touch all over No extinction on double simultaneous stimulation Coordination: No dysmetria in upper or lower extremities Gait testing deferred    Labs I have reviewed labs in epic and the results pertinent to this consultation are:  CBC    Component Value Date/Time   WBC 6.0 09/29/2020 1231   RBC 4.16 (L) 09/29/2020 1231   HGB 12.0 (L) 09/29/2020 1231   HCT 38.0 (L) 09/29/2020 1231   PLT 217 09/29/2020 1231   MCV 91.3 09/29/2020 1231   MCH 28.8 09/29/2020 1231   MCHC 31.6 09/29/2020 1231   RDW 14.3 09/29/2020 1231   LYMPHSABS 0.8  09/29/2020 1231   MONOABS 0.7 09/29/2020 1231   EOSABS 0.1 09/29/2020 1231   BASOSABS 0.1 09/29/2020 1231    CMP     Component Value Date/Time   NA 139 09/29/2020 1231   K 4.3 09/29/2020 1231   CL 103 09/29/2020 1231   CO2 26 09/29/2020 1231   GLUCOSE 105 (H) 09/29/2020 1231   BUN 29 (H) 09/29/2020 1231   CREATININE 1.54 (H) 09/29/2020 1231   CREATININE 1.46 (H) 04/11/2020 0833   CALCIUM 8.9 09/29/2020 1231   PROT 6.9 09/29/2020 1231   ALBUMIN 3.5 09/29/2020 1231   AST 17 09/29/2020 1231   ALT 13 09/29/2020 1231   ALKPHOS 71 09/29/2020 1231   BILITOT 0.9 09/29/2020 1231   GFRNONAA 45 (L) 09/29/2020 1231   GFRNONAA 53 (L) 09/21/2018 0956   GFRAA >60 07/27/2019 0837   GFRAA 62 09/21/2018 0956   Imaging I have reviewed the images obtained: CT head with no acute changes MRI brain with small acute infarcts in the left frontal lobe and left insular cortex.  MRA with occlusion of proximal to mid M2 left MCA branch.  Assessment: 79 year old with sudden onset of aphasia and right-sided weakness-symptoms completely resolved by the time he was evaluated by telemedicine neurology but noted to have permanent atrial fibrillation as well as left M2 occlusion for which she was started on a heparin drip by telemedicine neurology as well as discussion with neuro hospitalist on call during daytime. Accepted to stroke service in the ICU for closer monitoring given the acute clot and symptoms that were very severe and then resolved with the fear of acute decompensation once collaterals failed. MRI reveals left insular and small left frontal infarct.  Impression: Acute ischemic stroke likely cardioembolic from underlying atrial fibrillation not on anticoagulation.  Recommendations: Closer monitoring Stroke labs 2D echo Frequent neurochecks Telemetry Started on heparin drip-continue for now-stroke protocol. Aspirin and statin Low threshold for obtaining CTA and CT perfusion if neurological  deterioration happens Permissive hypertension-allow for systolic blood pressure to be high-given that he is on heparin drip, I would allow permissive hypertension up to systolic 259.  I would recommend systolic goal blood pressure between 140-180. PT OT speech therapy Gentle hydration-75 cc normal saline Chest x-ray Morning labs Has mild AKI-repeat labs in the morning. History of contrast-induced nephropathy but agreeable to doing CT and CT perfusion showed deterioration in his neurological exam happened because that would help determine further course of action in terms of intervention.  Being on heparin  drip right now, will preclude him from TPA but he would still be a interventional candidate.  He is a DNR/DNI but will reverse this periprocedurally, if need be for a procedure, and has confirmed with telemedicine neurology that his wife is the decision-maker.  -- Amie Portland, MD [facility] Pager: [phone] If 7pm to 7am, please call on call as listed on AMION.  CRITICAL CARE ATTESTATION Performed by: Amie Portland, MD Total additional critical care time: 50 minutes Critical care time was exclusive of separately billable procedures and treating other patients and/or supervising APPs/Residents/Students Critical care was necessary to treat or prevent imminent or life-threatening deterioration due to acute ischemic stroke This patient is critically ill and at significant risk for neurological worsening and/or death and care requires constant monitoring. Critical care was time spent personally by me on the following activities: development of treatment plan with patient and/or surrogate as well as nursing, discussions with consultants, evaluation of patient's response to treatment, examination of patient, obtaining history from patient or surrogate, ordering and performing treatments and interventions, ordering and review of laboratory studies, ordering and review of radiographic  studies, pulse oximetry, re-evaluation of patient's condition, participation in multidisciplinary rounds and medical decision making of high complexity in the care of this patient.

## 2020-09-29 NOTE — Consult Note (Addendum)
[facility] Telemedicine Consult   Requesting Provider: Isla Pence Consult Participants: Santiago Glad Stroke nurse, Jerene Pitch bedside nurse, patient, wife  Location of the provider: [facility] Location of the patient: [place] [facility]  This consult was provided via telemedicine with 2-way video and audio communication. The patient/family was informed that care would be provided in this way and agreed to receive care in this manner.   Chief Complaint: Right sided weakness and aphasia  HPI: This is a 79 year old male with a past medical history significant for atrial fibrillation (not on anticoagulation), coronary artery disease, hyperlipidemia, right nephrectomy, presenting with acute onset right-sided weakness and aphasia that rapidly resolved.  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  He additionally had severe right sided face arm and leg weakness.  His vital signs are notable for a blood pressure of 160/78 Leukos 93 at the time of my evaluation.  His symptoms had fully resolved as confirmed by my examination, as well as patient and his wife and bedside nurse.  Of note, patient reports that he did not tolerate anticoagulation due to severe bruising as well as cost concerns.  He is willing to reconsider resuming anticoagulation at this time though he remains concerned about cost.  LKW: 11:50 AM tpa given?: No, due to symptoms fully resolved IR Thrombectomy? No, due to no evidence of large vessel occlusion Modified Rankin scale: 0 Time of teleneurologist evaluation: 12:50 PM  Past Medical History:  Diagnosis Date  . Arthritis    shoulders and ribs   . Atrial fibrillation ([place])    atrial fib/ LOV [name] PA 04/11/12 EPIC, - CHEST X RAY, EKG 5/13 EPIC  . CAD (coronary artery disease)     s/p NSTEMI 04/08;  [place] 02/2007: Proximal LAD 75%, mid LAD 99%, proximal RCA 25%.  PCI:  Cypher DES to the  proximal and mid LAD.  Last nuclear study 11/2011 (after a trip to the ED with CP): EF 58%, low risk study with small inferior wall infarct at the mid and basal level, no ischemia.  Last echo 01/2010: Mild LVH, EF 55-60%, mild AI, mild MR, severely dilated LA and RA  . Contrast dye induced nephropathy    Hx ARF secondary to contrast nephropathy  . Dyslipidemia   . Dysrhythmia   . Epididymitis   . History of blood transfusion   . Myocardial infarction ([place]) 2008  . Pleurisy    2012  . Pneumonia    hx of   . 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 Spinal stenosis   . 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 [name],  skin cancer non melanoma   Past Surgical History:  Procedure Laterality Date  . arm surgery     orif right elbow  . CARDIAC CATHETERIZATION  2008  . coronary stents     . CYSTECTOMY  07/15/08  . CYSTOSCOPY WITH BIOPSY  03/23/2012   Procedure: CYSTOSCOPY WITH BIOPSY;  Surgeon: [name], MD;  Location: [facility];  Service: Urology;  Laterality: Right;   BRUSH BIOPSY RIGHT URETERAL STENT  . CYSTOSCOPY WITH URETEROSCOPY  03/23/2012   Procedure: CYSTOSCOPY WITH URETEROSCOPY;  Surgeon: [name], MD;  Location: [facility];  Service: Urology;  Laterality: Right;    **OR Room #8 requested**  C-ARM   . CYSTOSCOPY/RETROGRADE/URETEROSCOPY  02/15/2012   Procedure: CYSTOSCOPY/RETROGRADE/URETEROSCOPY;  Surgeon: [name], MD;  Location: [facility];  Service:  Urology;  Laterality: Right;  C-ARM  . JOINT REPLACEMENT     Dr. [name] 09-05-18   . left pinky finger      tip removed from accident  . right kidney removed      2013  . TOTAL KNEE ARTHROPLASTY Left 09/05/2018   Procedure: LEFT TOTAL KNEE ARTHROPLASTY;  Surgeon: [name], MD;  Location: [facility];  Service: Orthopedics;  Laterality: Left;  70 mins with abductor block  . TOTAL KNEE ARTHROPLASTY Right 04/03/2019   Procedure: RIGHT TOTAL KNEE ARTHROPLASTY;  Surgeon: [name], MD;  Location: [facility];  Service: Orthopedics;  Laterality: Right;  70  mins  . URETER SURGERY     No current facility-administered medications for this encounter.  Current Outpatient Medications:  .  atorvastatin (LIPITOR) 40 MG tablet, TAKE 1 TABLET EVERY EVENING, Disp: 90 tablet, Rfl: 3 .  calcitRIOL (ROCALTROL) 0.5 MCG capsule, Take 0.5 mcg by mouth daily., Disp: , Rfl:  .  furosemide (LASIX) 40 MG tablet, TAKE 1 TABLET EVERY DAY, Disp: 90 tablet, Rfl: 3 .  HYDROcodone-acetaminophen (NORCO) 5-325 MG tablet, Take 1 tablet by mouth every 6 (six) hours as needed for moderate pain., Disp: 20 tablet, Rfl: 0 .  Inositol Niacinate (NIACIN FLUSH FREE) 500 MG CAPS, Take 500 mg by mouth daily. , Disp: , Rfl:  .  niacin (NIASPAN) 500 MG CR tablet, Take 1 tablet (500 mg total) by mouth at bedtime., Disp:  , Rfl:  .  nitroGLYCERIN (NITROSTAT) 0.4 MG SL tablet, Place 1 tablet (0.4 mg total) under the tongue every 5 (five) minutes as needed for chest pain., Disp: 25 tablet, Rfl: 4 .  Omega-3 Fatty Acids (FISH OIL) 1200 MG CAPS, Take 1,200 mg by mouth daily. , Disp: , Rfl:  .  polyethylene glycol (MIRALAX / GLYCOLAX) 17 g packet, Take 17 g by mouth 2 (two) times daily., Disp: 14 each, Rfl: 0 .  traZODone (DESYREL) 50 MG tablet, Take 0.5-1 tablets (25-50 mg total) by mouth at bedtime as needed for sleep., Disp: 90 tablet, Rfl: 3 .  vitamin B-12 (CYANOCOBALAMIN) 500 MCG tablet, Take 500 mcg by mouth daily., Disp: , Rfl:       Exam: Vitals:   09/29/20 1230  BP: (!) 161/73  Pulse: 65  Resp: 18  Temp: 98.5 F (36.9 C)  SpO2: 98%    General:  Comfortable, no acute distress HR irregularly irregular on monitor Breathing comfortable  Abdomen non-distended   NIHSS 0  1A: Level of Consciousness - 0 1B: Ask Month and Age - 0 1C: 'Blink Eyes' & 'Squeeze Hands' - 0 2: Test Horizontal Extraocular Movements - 0 3: Test Visual Fields - 0 4: Test Facial Palsy - 0 5A: Test Left Arm Motor Drift - 0 5B: Test Right Arm Motor Drift - 0 6A: Test Left Leg Motor Drift - 0 6B:  Test Right Leg Motor Drift - 0 7: Test Limb Ataxia - 0 8: Test Sensation - 0 9: Test Language/Aphasia- 0 10: Test Dysarthria - 0 11: Test Extinction/Inattention - 0 NIHSS score: 0  Imaging Reviewed: Dry head CT personally reviewed, some chronic microvascular change, BG calcification L > R, bilateral vert athero   Labs reviewed in epic and pertinent values follow: Creatinine 1.54, GFR 45, White blood cell count 6   Assessment: This is an 79 year old gentleman who experienced likely a left MCA territory TIA in the setting of atrial fibrillation not on anticoagulation.  Will complete a TIA work-up; patient will need  further discussion to determine which anticoagulant to start for his atrial fibrillation.  Echo and MRI brain will help determine ideal timing of anticoagulation.  Recommendations:   # L MCA territory TIA vs. subclinical stroke - Stroke labs TSH, HgbA1c, fasting lipid panel - MRI brain  - MRA of the brain without contrast and MRA neck w/wo - Frequent neuro checks, please reactivate code stroke if patient has any neurological decline - Echocardiogram - Continue home aspirin, switch to anticoagulation with start date to be determined after MRI brain - Risk factor modification - Telemetry monitoring; no event monitor indicated on discharge as patient has a known diagnosis of atrial fibrillation - Blood pressure goal   - Permissive hypertension to 220/120 pending MRI brain, if negative for stroke, slowly start normalizing blood pressure per standard hypertension protocols - PT consult, OT consult, Speech consult, not indicated unless patient has a significant decline - Stroke team to follow  This patient is receiving care for possible acute neurological changes. There was 45 minutes of care by this provider at the time of service, including time for direct evaluation via telemedicine, review of medical records, imaging studies and discussion of findings with providers, the  patient and/or family.  Plan communicated to ED provider Dr. Gilford Raid by epic secure message  Lesleigh Noe MD-PhD [facility] [phone]   [facility] coverage for teleneurology is 8 AM to 8 PM by telephone/video. 8 PM to 8 AM emergent questions or overnight urgent questions should be addressed to Teleneurology On-call or [facility] neurohospitalist; contact information can be found on AMION  Addendum:  Reviewed MRI imaging with Neuroradiology, concern for M2 occlusion though exam slightly motion limited on ToF sequences, hemosiderin staining and FLAIR are consistent with occlusive clot. Patient re-examined, remains at baseline. Language fully intact on all NIH stroke cards. Confrontational strength testing by bedside nurse symmetric. Perhaps very mild pronation of right hand without drift. NIHSS remains 0. Considered CTA/CTP to confirm no significant tissue at risk from clot on MRI but this is contraindicated given patient's iodine allergy leading to renal failure in 2008. Per Neuroradiology MRI ASL imaging cannot be preformed here and cannot give patient additional gadolinium for MR perfusion (. At this point he is not a candidate for intervention as risks outweigh benefits given NIH of 0; suspect potential acute on chronic occlusion with compensating collaterals. Recommend transfer to [facility] Neuro ICU for close q1hr neurochecks.   This patient was evaluated for a critical condition -- left MCA M2 occlusion, presenting significant risk of permanent neurological injury and death.   The patient and wife clarified that his contrast allergy was renal failure after his heart attack and cardiac cath in 2008, which was also when he developed "artificial fibrillation" which is what he calls "atrial fibrillation" (not a new aphasia, his long-standing normal terminology per his wife). 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 There was no issue with hives or difficulty breathing.   In extended discussion with patient, wife and Dr. Cheral Marker (60 minutes additional critical care time), decision was made to transfer patient to [facility] for close neurological monitoring with plan for low-goal no-bolus heparin drip and potential intervention in case of acute substantial decline. Patient accepts risk of renal failure in case interventional is needed and is aware of risk of hemorrhage / worsening (10% chance of ICH typically) as well as death, with potential of improvement (should  he worsen and require procedure) of approximately 50%.   He is DNR/DNI code status but wants to reverse this peri-procedurally and confirms wife is his Media planner.

## 2020-09-30 ENCOUNTER — Inpatient Hospital Stay (HOSPITAL_COMMUNITY): Payer: Medicare HMO

## 2020-09-30 ENCOUNTER — Other Ambulatory Visit: Payer: Self-pay

## 2020-09-30 DIAGNOSIS — I6389 Other cerebral infarction: Secondary | ICD-10-CM | POA: Diagnosis not present

## 2020-09-30 DIAGNOSIS — I639 Cerebral infarction, unspecified: Secondary | ICD-10-CM | POA: Diagnosis not present

## 2020-09-30 DIAGNOSIS — I340 Nonrheumatic mitral (valve) insufficiency: Secondary | ICD-10-CM

## 2020-09-30 LAB — LIPID PANEL
Cholesterol: 110 mg/dL (ref 0–200)
HDL: 30 mg/dL — ABNORMAL LOW (ref >40)
LDL Cholesterol: 69 mg/dL (ref 0–99)
Total CHOL/HDL Ratio: 3.7 RATIO
Triglycerides: 54 mg/dL (ref <150)
VLDL: 11 mg/dL (ref 0–40)

## 2020-09-30 LAB — ECHOCARDIOGRAM COMPLETE
Calc EF: 46.5 %
Height: 72 in
MV M vel: 3.68 m/s
MV Peak grad: 54.2 mmHg
S' Lateral: 4 cm
Single Plane A2C EF: 48 %
Single Plane A4C EF: 40.6 %
Weight: 3022.95 oz

## 2020-09-30 LAB — HEMOGLOBIN A1C
Hgb A1c MFr Bld: 5.9 % — ABNORMAL HIGH (ref 4.8–5.6)
Mean Plasma Glucose: 122.63 mg/dL

## 2020-09-30 LAB — GLUCOSE, CAPILLARY: Glucose-Capillary: 122 mg/dL — ABNORMAL HIGH (ref 70–99)

## 2020-09-30 LAB — HEPARIN LEVEL (UNFRACTIONATED): Heparin Unfractionated: 0.25 IU/mL — ABNORMAL LOW (ref 0.30–0.70)

## 2020-09-30 MED ORDER — ASPIRIN EC 81 MG PO TBEC
81.0000 mg | DELAYED_RELEASE_TABLET | Freq: Every day | ORAL | Status: DC
  Administered 2020-10-01: 81 mg via ORAL
  Filled 2020-09-30: qty 1

## 2020-09-30 MED ORDER — APIXABAN 2.5 MG PO TABS
2.5000 mg | ORAL_TABLET | Freq: Two times a day (BID) | ORAL | Status: DC
  Administered 2020-09-30 – 2020-10-01 (×3): 2.5 mg via ORAL
  Filled 2020-09-30 (×4): qty 1

## 2020-09-30 NOTE — Progress Notes (Signed)
Benefits check submitted for Eliquis.  

## 2020-09-30 NOTE — Evaluation (Signed)
Physical Therapy Evaluation Patient Details Name: [name] [name] MRN: [id number] DOB: 06/01/1941 Today's Date: 09/30/2020   History of Present Illness  Patient is a 79 y/o male who presents with right sided weakness, facial droop and aphasia. 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 PMH includes uroepithelial cancer, spinal stenosis, MI, dyslipidemia, CAD, A-fib.  Clinical Impression  Patient presents with generalized weakness, impaired balance and impaired mobility s/p above. Pt reports being Mod I with SPC PRN and doing own ADLs. Lives with wife and drives. Reports hx of falls. Today, pt tolerated bed mobility with supervision, Min guard-Min A for transfers and ambulation for safety due to first time being up. Question possible STM deficits. Would likely do better with DME (SPC vs RW) for support. Will follow acutely to maximize independence and mobility prior to return home.    Follow Up Recommendations No PT follow up;Supervision for mobility/OOB    Equipment Recommendations  None recommended by PT    Recommendations for Other Services       Precautions / Restrictions Precautions Precautions: Fall Restrictions Weight Bearing Restrictions: No      Mobility  Bed Mobility Overal bed mobility: Needs Assistance Bed Mobility: Supine to Sit     Supine to sit: Supervision;HOB elevated     General bed mobility comments: use of rail, no physical assist needed but assist with managing lines.    Transfers Overall transfer level: Needs assistance Equipment used: None Transfers: Sit to/from Stand Sit to Stand: Min guard         General transfer comment: Min guard to steady in standing and manage lines. Stood from [employer], from toilet x1.  Ambulation/Gait Ambulation/Gait assistance: Min assist;Min guard Gait Distance (Feet): 15 Feet (x2  bouts) Assistive device: IV Pole;None Gait Pattern/deviations: Step-through pattern;Decreased stride length Gait velocity: decreased Gait velocity interpretation: <1.31 ft/sec, indicative of household ambulator General Gait Details: Slow, mildly unsteady gait holding IV pole initially. Min A at times progressing to Min guard. 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  Stairs            Wheelchair Mobility    Modified Rankin (Stroke Patients Only) Modified Rankin (Stroke Patients Only) Pre-Morbid Rankin Score: Moderate disability Modified Rankin: Moderately severe disability     Balance Overall balance assessment: Needs assistance;History of Falls Sitting-balance support: Feet supported;No upper extremity supported Sitting balance-Leahy Scale: Good Sitting balance - Comments: total A to donn socks, "I need help with these, my knees are bad and there are too many lines."   Standing balance support: During functional activity Standing balance-Leahy Scale: Fair Standing balance comment: CLose Min guard-Min A for balance.                             Pertinent Vitals/Pain Pain Assessment: No/denies pain    Home Living Family/patient expects to be discharged to:: Private residence Living Arrangements: Spouse/significant other Available Help at Discharge: Family;Available 24 hours/day Type of Home: House Home Access: Stairs to enter Entrance Stairs-Rails: Psychiatric nurse of Steps: 4-5 Home Layout: One level Home Equipment: Walker - 2 wheels;Cane - single point;Bedside commode;Walker - 4 wheels;Shower seat - built in      Prior Function Level of Independence: Independent with assistive device(s)         Comments: Uses SPC as needed,  drives. Does own ADLs.     Hand Dominance   Dominant Hand: Right    Extremity/Trunk Assessment   Upper Extremity Assessment Upper Extremity Assessment: Defer to OT  evaluation    Lower Extremity Assessment Lower Extremity Assessment: Generalized weakness (hx of bilateral knee replacements, functional strength)       Communication   Communication: HOH  Cognition Arousal/Alertness: Awake/alert Behavior During Therapy: Flat affect Overall Cognitive Status: No family/caregiver present to determine baseline cognitive functioning                                 General Comments: Appears WFL for basic mobility tasks. HOH so needs repetition at times. Questions STM deficits as pt asked to let therapist know when he was standing up from toilet and forgot.      General Comments General comments (skin integrity, edema, etc.): VSS on RA.    Exercises     Assessment/Plan    PT Assessment Patient needs continued PT services  PT Problem List Decreased strength;Decreased mobility;Decreased safety awareness;Decreased cognition;Decreased balance       PT Treatment Interventions Therapeutic activities;Gait training;Therapeutic exercise;Patient/family education;Balance training;Stair training;Functional mobility training;Neuromuscular re-education;DME instruction    PT Goals (Current goals can be found in the Care Plan section)  Acute Rehab PT Goals Patient Stated Goal: to get better and go home PT Goal Formulation: With patient Time For Goal Achievement: 10/14/20 Potential to Achieve Goals: Good    Frequency Min 4X/week   Barriers to discharge Inaccessible home environment stairs    Co-evaluation               AM-PAC PT "6 Clicks" Mobility  Outcome Measure Help needed turning from your back to your side while in a flat bed without using bedrails?: None Help needed moving from lying on your back to sitting on the side of a flat bed without using bedrails?: None Help needed moving to and from a bed to a chair (including a wheelchair)?: A Little Help needed standing up from a chair using your arms (e.g., wheelchair or bedside  chair)?: A Little Help needed to walk in hospital room?: A Little Help needed climbing 3-5 steps with a railing? : A Little 6 Click Score: 20    End of Session Equipment Utilized During Treatment: Gait belt Activity Tolerance: Patient tolerated treatment well Patient left: in chair;with call bell/phone within reach (no chair alarm pads left, RN aware) Nurse Communication: Mobility status PT Visit Diagnosis: Difficulty in walking, not elsewhere classified (R26.2);Muscle weakness (generalized) (M62.81);Unsteadiness on feet (R26.81)    Time: 4081-4481 PT Time Calculation (min) (ACUTE ONLY): 23 min   Charges:   PT Evaluation $PT Eval Moderate Complexity: 1 Mod PT Treatments $Therapeutic Activity: 8-22 mins        Marisa Severin, PT, DPT Acute Rehabilitation Services Pager [phone] Office [phone]      Marguarite Arbour A Ramsey Guadamuz 09/30/2020, 11:09 AM

## 2020-09-30 NOTE — Progress Notes (Signed)
STROKE TEAM PROGRESS NOTE   SUBJECTIVE (INTERVAL HISTORY) No family is at the bedside.  Overall his condition is stable. Pt has hard of hearing and stated that he was on Xarelto before but stopped as he can not afford that medication. But willing to try any [facility] again as long as he is able to afford.    OBJECTIVE Temp:  [97.5 F (36.4 C)-98.5 F (36.9 C)] 98.1 F (36.7 C) (11/16 0800) Pulse Rate:  [33-75] 57 (11/16 0900) Resp:  [10-26] 16 (11/16 0900) BP: (113-175)/(59-116) 144/78 (11/16 0900) SpO2:  [93 %-100 %] 100 % (11/16 0900) Weight:  [85.7 kg] 85.7 kg (11/16 0500)  Recent Labs  Lab 09/29/20 1226  GLUCAP 93   Recent Labs  Lab 09/29/20 1231  NA 139  K 4.3  CL 103  CO2 26  GLUCOSE 105*  BUN 29*  CREATININE 1.54*  CALCIUM 8.9   Recent Labs  Lab 09/29/20 1231  AST 17  ALT 13  ALKPHOS 71  BILITOT 0.9  PROT 6.9  ALBUMIN 3.5   Recent Labs  Lab 09/29/20 1231  WBC 6.0  NEUTROABS 4.4  HGB 12.0*  HCT 38.0*  MCV 91.3  PLT 217   No results for input(s): CKTOTAL, CKMB, CKMBINDEX, TROPONINI in the last 168 hours. Recent Labs    09/29/20 1231  LABPROT 14.3  INR 1.2   Recent Labs    09/29/20 1454  COLORURINE STRAW*  LABSPEC 1.006  PHURINE 7.0  GLUCOSEU NEGATIVE  HGBUR NEGATIVE  BILIRUBINUR NEGATIVE  KETONESUR NEGATIVE  PROTEINUR NEGATIVE  NITRITE NEGATIVE  LEUKOCYTESUR NEGATIVE       Component Value Date/Time   CHOL 110 09/30/2020 0334   TRIG 54 09/30/2020 0334   HDL 30 (L) 09/30/2020 0334   CHOLHDL 3.7 09/30/2020 0334   VLDL 11 09/30/2020 0334   LDLCALC 69 09/30/2020 0334   LDLCALC 76 04/11/2020 0833   Lab Results  Component Value Date   HGBA1C 5.9 (H) 09/30/2020      Component Value Date/Time   LABOPIA NONE DETECTED 09/29/2020 1454   COCAINSCRNUR NONE DETECTED 09/29/2020 1454   LABBENZ NONE DETECTED 09/29/2020 1454   AMPHETMU NONE DETECTED 09/29/2020 1454   THCU NONE DETECTED 09/29/2020 1454   LABBARB NONE DETECTED 09/29/2020 1454     Recent Labs  Lab 09/29/20 1231  ETH <10    I have personally reviewed the radiological images below and agree with the radiology interpretations.  DG Chest 1 View  Result Date: 09/29/2020 CLINICAL DATA:  Cough EXAM: CHEST  1 VIEW COMPARISON:  Radiograph 07/26/2019 FINDINGS: Increasing size of a left pleural effusion now moderate with adjacent opacity likely reflecting some passive atelectatic changes. Some increased interstitial markings, vascular congestion and cardiomegaly are similar to slightly diminished from prior though could suggest some interstitial edematous changes. No pneumothorax. No right effusion. No acute osseous or soft tissue abnormality. Degenerative changes are present in the imaged spine and shoulders. A large mineralization in the inferior recess of left shoulder is similar prior. Telemetry leads overlie the chest. IMPRESSION: 1. Increasing size of a left pleural effusion now moderate with adjacent opacity likely reflecting some passive atelectatic changes though underlying airspace disease is difficult to exclude. 2. Suspect some mild interstitial edema with appearance of cardiomegaly and vascular congestion. Electronically Signed   By: Lovena Le M.D.   On: 09/29/2020 22:12   MR ANGIO HEAD WO CONTRAST  Result Date: 09/29/2020 CLINICAL DATA:  Neuro deficit, acute, stroke suspected. Additional history provided by  scanning technologist: Right-sided weakness and slurred speech for 1 day. EXAM: MRI HEAD WITHOUT CONTRAST MRA HEAD WITHOUT CONTRAST TECHNIQUE: Multiplanar, multiecho pulse sequences of the brain and surrounding structures were obtained without intravenous contrast. Angiographic images of the head were obtained using MRA technique without contrast. COMPARISON:  Noncontrast head CT performed earlier the same day 09/29/2020. brain MRI 08/09/2014 FINDINGS: MRI HEAD FINDINGS Brain: Mild intermittent motion degradation. The axial DWI sequence is most notably affected.  Mild generalized cerebral atrophy. 7 mm focus of restricted diffusion within the posterior left frontal lobe white matter consistent with acute infarction (series 5, image 29). Additional small cortical infarct within the posterior left insula. Moderate multifocal T2/FLAIR hyperintensity within the cerebral white matter is nonspecific, but compatible with chronic small vessel ischemic disease. Small chronic left basal ganglia lacunar infarct, new from the prior MRI (series 19, image 18). No evidence of intracranial mass. No chronic intracranial blood products. No extra-axial fluid collection. No midline shift. Vascular: Focus of T2* signal loss within the posterior aspect of the left sylvian fissure likely reflecting endoluminal thrombus within a left M2 MCA branch vessel (series 16, image 14) Skull and upper cervical spine: No focal marrow lesion. Sinuses/Orbits: Visualized orbits show no acute finding. Trace ethmoid sinus mucosal thickening. MRA HEAD FINDINGS The intracranial internal carotid arteries are patent. The M1 middle cerebral arteries are patent without significant stenosis. There is occlusion of a proximal to mid M2 left MCA branch vessel (for instance as seen on series 10, images 143-146). There was corresponding T2* signal loss at this site on the concurrently performed brain MRI likely reflecting endoluminal thrombus. There is only irregular and intermittent flow related signal seen more distally within this vessel. Apparent high-grade stenosis of a separate proximal M2 left MCA branch vessel (series 1064, image 6). No right M2 proximal branch occlusion or high-grade proximal stenosis. The anterior cerebral arteries are patent. 2 mm inferiorly projecting vascular protrusion arising from the supraclinoid right ICA which may reflect a small aneurysm or infundibulum (series 10, image 105). The intracranial vertebral arteries are patent. The basilar artery is patent. The posterior cerebral arteries are  patent. High-grade stenoses within the bilateral posterior cerebral arteries at the P3/P4 junctions. These results were called by telephone at the time of interpretation on 09/29/2020 at 2:40 pm to provider [facility] , who verbally acknowledged these results. IMPRESSION: MRI brain: 1. Mildly motion degraded exam. 2. Small acute infarcts within the posterior left frontal lobe white matter and left insular cortex. 3. Mild cerebral atrophy with moderate chronic small vessel ischemic disease, progressed as compared to the MRI of 08/09/2014. 4. A small chronic left basal ganglia lacunar infarct is new from this prior MRI. MRA head: 1. 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 T2* signal loss also present at this site, likely reflecting endoluminal thrombus. 2. High-grade proximal stenosis within a separate M2 left MCA branch vessel. 3. Apparent high-grade stenoses within the bilateral posterior cerebral arteries at the P3/P4 junctions. However, these apparent stenoses may be accentuated by artifact on the current exam. 4. 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 Electronically Signed   By: Kellie Simmering DO   On: 09/29/2020 14:47   MR ANGIO NECK W WO CONTRAST  Result Date: 09/29/2020 CLINICAL DATA:  Neuro deficit, acute, stroke suspected. Additional history provided by scanning technologist: Right-sided weakness and slurred speech for 1 day. EXAM: MRA NECK WITHOUT AND WITH CONTRAST TECHNIQUE: Multiplanar and multiecho pulse  sequences of the neck were obtained without and with intravenous contrast. Angiographic images of the neck were obtained using MRA technique without and with intravenous contrast. CONTRAST:  4mL GADAVIST GADOBUTROL 1 MMOL/ML IV SOLN COMPARISON:  Noncontrast head CT 09/29/2020.  Brain MRI 08/09/2014. FINDINGS: The left vertebral artery appears to arise directly from the aortic arch. The visualized aortic arch is otherwise unremarkable. No  hemodynamically significant innominate or proximal subclavian artery stenosis. The bilateral common and internal carotid arteries are patent within the neck without hemodynamically significant stenosis. The vertebral arteries are patent within the neck with antegrade flow bilaterally. The right vertebral artery is dominant. There is apparent at least moderate stenosis within the V1 left vertebral artery. Moderate stenosis within the left vertebral artery more distally at the V3/V4 junction. IMPRESSION: The common and internal carotid arteries are patent within the neck without hemodynamically significant stenosis. The vertebral arteries are patent within the neck with antegrade flow. At least moderate stenosis of the non-dominant V1 left vertebral artery (which arises directly from the aortic arch). A moderate stenosis is also present more distally within this vessel at the V3/V4 junction. Electronically Signed   By: Kellie Simmering DO   On: 09/29/2020 15:02   MR BRAIN WO CONTRAST  Result Date: 09/29/2020 CLINICAL DATA:  Neuro deficit, acute, stroke suspected. Additional history provided by scanning technologist: Right-sided weakness and slurred speech for 1 day. EXAM: MRI HEAD WITHOUT CONTRAST MRA HEAD WITHOUT CONTRAST TECHNIQUE: Multiplanar, multiecho pulse sequences of the brain and surrounding structures were obtained without intravenous contrast. Angiographic images of the head were obtained using MRA technique without contrast. COMPARISON:  Noncontrast head CT performed earlier the same day 09/29/2020. brain MRI 08/09/2014 FINDINGS: MRI HEAD FINDINGS Brain: Mild intermittent motion degradation. The axial DWI sequence is most notably affected. Mild generalized cerebral atrophy. 7 mm focus of restricted diffusion within the posterior left frontal lobe white matter consistent with acute infarction (series 5, image 29). Additional small cortical infarct within the posterior left insula. Moderate multifocal  T2/FLAIR hyperintensity within the cerebral white matter is nonspecific, but compatible with chronic small vessel ischemic disease. Small chronic left basal ganglia lacunar infarct, new from the prior MRI (series 19, image 18). No evidence of intracranial mass. No chronic intracranial blood products. No extra-axial fluid collection. No midline shift. Vascular: Focus of T2* signal loss within the posterior aspect of the left sylvian fissure likely reflecting endoluminal thrombus within a left M2 MCA branch vessel (series 16, image 14) Skull and upper cervical spine: No focal marrow lesion. Sinuses/Orbits: Visualized orbits show no acute finding. Trace ethmoid sinus mucosal thickening. MRA HEAD FINDINGS The intracranial internal carotid arteries are patent. The M1 middle cerebral arteries are patent without significant stenosis. There is occlusion of a proximal to mid M2 left MCA branch vessel (for instance as seen on series 10, images 143-146). There was corresponding T2* signal loss at this site on the concurrently performed brain MRI likely reflecting endoluminal thrombus. There is only irregular and intermittent flow related signal seen more distally within this vessel. Apparent high-grade stenosis of a separate proximal M2 left MCA branch vessel (series 1064, image 6). No right M2 proximal branch occlusion or high-grade proximal stenosis. The anterior cerebral arteries are patent. 2 mm inferiorly projecting vascular protrusion arising from the supraclinoid right ICA which may reflect a small aneurysm or infundibulum (series 10, image 105). The intracranial vertebral arteries are patent. The basilar artery is patent. The posterior cerebral arteries are patent. High-grade stenoses within  the bilateral posterior cerebral arteries at the P3/P4 junctions. These results were called by telephone at the time of interpretation on 09/29/2020 at 2:40 pm to provider [facility] , who verbally acknowledged these results.  IMPRESSION: MRI brain: 1. Mildly motion degraded exam. 2. Small acute infarcts within the posterior left frontal lobe white matter and left insular cortex. 3. Mild cerebral atrophy with moderate chronic small vessel ischemic disease, progressed as compared to the MRI of 08/09/2014. 4. A small chronic left basal ganglia lacunar infarct is new from this prior MRI. MRA head: 1. 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 T2* signal loss also present at this site, likely reflecting endoluminal thrombus. 2. High-grade proximal stenosis within a separate M2 left MCA branch vessel. 3. Apparent high-grade stenoses within the bilateral posterior cerebral arteries at the P3/P4 junctions. However, these apparent stenoses may be accentuated by artifact on the current exam. 4. 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 Electronically Signed   By: Kellie Simmering DO   On: 09/29/2020 14:47   CT HEAD CODE STROKE WO CONTRAST  Result Date: 09/29/2020 CLINICAL DATA:  Code stroke. Neuro deficit, acute stroke suspected. Right-sided weakness. EXAM: CT HEAD WITHOUT CONTRAST TECHNIQUE: Contiguous axial images were obtained from the base of the skull through the vertex without intravenous contrast. COMPARISON:  None. FINDINGS: Brain: No evidence of acute large vascular territory infarction, hemorrhage, hydrocephalus, extra-axial collection or mass lesion/mass effect. Patchy white matter hypoattenuation is nonspecific, but most likely secondary to chronic microvascular ischemic disease. Mild generalized atrophy with ex vacuo ventricular dilation. Vascular: No hyperdense vessel or unexpected calcification. Calcific atherosclerosis. Skull: No acute fracture. Sinuses/Orbits: No acute finding. Other: No mastoid effusions. ASPECTS [facility] Stroke Program Early CT Score) total score (0-10 with 10 being normal): 10 IMPRESSION: 1. No evidence of acute intracranial abnormality.   ASPECTS is 10. 2.  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 Code stroke imaging results were communicated on 09/29/2020 at 12:27 pm to provider Dr. Gilford Raid via telephone, who verbally acknowledged these results. Electronically Signed   By: Margaretha Sheffield MD   On: 09/29/2020 12:30    PHYSICAL EXAM  Temp:  [97.5 F (36.4 C)-98.5 F (36.9 C)] 98.1 F (36.7 C) (11/16 0800) Pulse Rate:  [33-75] 57 (11/16 0900) Resp:  [10-26] 16 (11/16 0900) BP: (113-175)/(59-116) 144/78 (11/16 0900) SpO2:  [93 %-100 %] 100 % (11/16 0900) Weight:  [85.7 kg] 85.7 kg (11/16 0500)  General - Well nourished, well developed, in no apparent distress.  Ophthalmologic - fundi not visualized due to noncooperation.  Cardiovascular - irregularly irregular heart rate and rhythm.  Mental Status -  Level of arousal and orientation to time, place, and person were intact. Language including expression, naming, repetition, comprehension was assessed and found intact. Fund of Knowledge was assessed and was intact.  Cranial Nerves II - XII - II - Visual field intact OU. III, IV, VI - Extraocular movements intact. V - Facial sensation intact bilaterally. VII - Facial movement intact bilaterally. VIII - Hearing & vestibular intact bilaterally. Hard of hearing.  X - Palate elevates symmetrically. XI - Chin turning & shoulder shrug intact bilaterally. XII - Tongue protrusion intact.  Motor Strength - The patient's strength was normal in all extremities and pronator drift was absent.  Bulk was normal and fasciculations were absent.   Motor Tone - Muscle tone was assessed at the neck and appendages and was normal.  Reflexes - The patient's  reflexes were symmetrical in all extremities and he had no pathological reflexes.  Sensory - Light touch, temperature/pinprick were assessed and were symmetrical.    Coordination - The patient had normal movements in the hands with no ataxia or dysmetria.  Tremor was  absent.  Gait and Station - deferred.   ASSESSMENT/PLAN [name] [name] is a 79 y.o. male with history of afib, CAD, HLD, right nephrectomy admitted for right side weakness and aphasia. No tPA given due to resolved symptoms.    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 chronic left BG infarct  MRA head and neck left M2 occlusion and another left M2 branch high grade stenosis. B/l P3/4 stenosis  2D Echo  EF 55%.  There is a small independent mobile echodensity on the ventricular aspect of aortic valve.  LDL 69  HgbA1c 5.9  Heparin IV for VTE prophylaxis  aspirin 81 mg daily prior to admission, now on heparin IV.  Given small infarct size, will switch to Eliquis. Continue ASA 81 due to CAD s/p LAD stenting  Patient counseled to be compliant with his antithrombotic medications  Ongoing aggressive stroke risk factor management  Therapy recommendations: None  Disposition: Likely home tomorrow  Permanent afib not on [facility]  Per patient, he was on Xarelto for brief time, however not able to continue due to cost   He has declined DOAC in his recent visit with Dr. Burt Knack in 10/2019  Currently rate controlled  Patient waiting to restart anticoagulation  Switch heparin IV to Eliquis  TTE showed small independent mobile echodensity and ventricular aspect of aortic valve  We will curbside cardiology Dr. Johnsie Cancel to see if TEE needed.  Hypertension . Stable now . BP goal < 180/105 on heparin IV for now  Long term BP goal normotensive  Hyperlipidemia  Home meds:  lipitor 40   LDL 69, goal < 70  Now on lipitor 40  Continue statin at discharge  Other Stroke Risk Factors  Advanced age  former cigarette smoker - quit 38 years ago  ETOH use, recommend no more than 1 drink per day  Hx stroke/TIA by imaging  Coronary artery disease s/p  LAD stenting  Other Active Problems  AKI on CKD - Cre 1.54->pending  Hospital day # 1  This patient is critically ill due to stroke, afib, CAD, MCA occlusion, on heparin IV and at significant risk of neurological worsening, death form recurrent stroke, heart failure, seizure, heart attack or hemorrhage from anticoagulation. This patient's care requires constant monitoring of vital signs, hemodynamics, respiratory and cardiac monitoring, review of multiple databases, neurological assessment, discussion with family, other specialists and medical decision making of high complexity. I spent 35 minutes of neurocritical care time in the care of this patient.  Rosalin Hawking, MD PhD Stroke Neurology 09/30/2020 10:45 AM    To contact Stroke Continuity provider, please refer to [email or web address] After hours, contact General Neurology

## 2020-09-30 NOTE — Progress Notes (Signed)
  Echocardiogram 2D Echocardiogram has been performed.  [name] [name] 09/30/2020, 9:39 AM

## 2020-09-30 NOTE — TOC Benefit Eligibility Note (Signed)
Transition of Care [facility]) Benefit Eligibility Note    Patient Details  Name: [name] [name] MRN: [id number] Date of Birth: [birth year]   Medication/Dose: ELIQUIS  5 MG BID  Covered?: Yes  Tier: 3 Drug  Prescription Coverage Preferred Pharmacy: [facility]  and  Roseanne Kaufman with Person/Company/Phone Number:: TAJA  @  [employer] # [phone]  Co-Pay: $45.00  Prior Approval: No  Deductible: Met       Memory Argue Phone Number: 09/30/2020, 3:45 PM

## 2020-09-30 NOTE — Evaluation (Addendum)
Occupational Therapy Evaluation Patient Details Name: [name] [name] MRN: [id number] DOB: [birth year] Today's Date: 09/30/2020    History of Present Illness Patient is a 79 y/o male who presents with right sided weakness, facial droop and aphasia. 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 PMH includes uroepithelial cancer, spinal stenosis, MI, dyslipidemia, CAD, A-fib.   Clinical Impression   This 79 y/o male presents with the above. PTA pt reports independence with ADL, iADL and functional mobility with intermittent use of SPC. Today pt performing mobility tasks without AD at minA level, requiring up to [facility] for LB ADL. Pt very HOH and often requiring repetition/increased time for command follow. He may benefit from higher level cognitive assessment. VSS throughout. Pt to benefit from continued acute OT services to maximize his safety and independence with ADL and mobility. Do not anticipate he will require follow up OT services after discharge.     Follow Up Recommendations  No OT follow up;Supervision/Assistance - 24 hour (24hr initially)    Equipment Recommendations  None recommended by OT           Precautions / Restrictions Precautions Precautions: Fall Restrictions Weight Bearing Restrictions: No      Mobility Bed Mobility Overal bed mobility: Needs Assistance Bed Mobility: Supine to Sit     Supine to sit: Supervision;HOB elevated     General bed mobility comments: OOB in recliner upon arrival     Transfers Overall transfer level: Needs assistance Equipment used: None Transfers: Sit to/from Stand Sit to Stand: Min guard         General transfer comment: for balance and safety    Balance Overall balance assessment: Needs assistance;History of Falls Sitting-balance support: Feet supported;No upper extremity supported Sitting  balance-Leahy Scale: Good Sitting balance - Comments: total A to donn socks, "I need help with these, my knees are bad and there are too many lines."   Standing balance support: No upper extremity supported;During functional activity Standing balance-Leahy Scale: Fair Standing balance comment: CLose Min guard-Min A for balance.                           ADL either performed or assessed with clinical judgement   ADL Overall ADL's : Needs assistance/impaired Eating/Feeding: Modified independent;Sitting   Grooming: Min guard;Standing;Wash/dry face;Wash/dry hands;Oral care   Upper Body Bathing: Min guard;Sitting   Lower Body Bathing: Minimal assistance;Sit to/from stand   Upper Body Dressing : Set up;Sitting   Lower Body Dressing: Moderate assistance Lower Body Dressing Details (indicate cue type and reason): pt unable to reach his feet for socks  Toilet Transfer: Minimal assistance;Ambulation   Toileting- Clothing Manipulation and Hygiene: Minimal assistance;Sit to/from stand Toileting - Clothing Manipulation Details (indicate cue type and reason):  standing to void bladder in bathroom     Functional mobility during ADLs: Minimal assistance       Vision Baseline Vision/History: Wears glasses Wears Glasses: At all times Patient Visual Report: No change from baseline Vision Assessment?: No apparent visual deficits     Perception     Praxis      Pertinent Vitals/Pain Pain Assessment: No/denies pain     Hand Dominance Right   Extremity/Trunk Assessment Upper Extremity Assessment Upper Extremity Assessment: Overall WFL for tasks assessed (symmetrical strength (4/5), decreased fine motor bil )   Lower Extremity Assessment Lower Extremity  Assessment: Defer to PT evaluation       Communication Communication Communication: HOH   Cognition Arousal/Alertness: Awake/alert Behavior During Therapy: Flat affect Overall Cognitive Status: No family/caregiver  present to determine baseline cognitive functioning                                 General Comments: pt very HOH and often requires repetition of instruction, flat affect, may benefit from higher level asessment    General Comments  VSS on RA    Exercises     Shoulder Instructions      Home Living Family/patient expects to be discharged to:: Private residence Living Arrangements: Spouse/significant other Available Help at Discharge: Family;Available 24 hours/day Type of Home: House Home Access: Stairs to enter [employer] of Steps: 4-5 Entrance Stairs-Rails: Right;Left Home Layout: One level     Bathroom Shower/Tub: Occupational psychologist: Standard     Home Equipment: Environmental consultant - 2 wheels;Cane - single point;Bedside commode;Walker - 4 wheels;Shower seat - built in          Prior Functioning/Environment Level of Independence: Independent with assistive device(s)        Comments: Uses SPC as needed, drives. Does own ADLs.        OT Problem List: Decreased strength;Decreased range of motion;Decreased activity tolerance;Impaired balance (sitting and/or standing);Decreased cognition;Impaired UE functional use;Decreased knowledge of use of DME or AE      OT Treatment/Interventions: Self-care/ADL training;Therapeutic exercise;Energy conservation;DME and/or AE instruction;Therapeutic activities;Cognitive remediation/compensation;Patient/family education;Balance training    OT Goals(Current goals can be found in the care plan section) Acute Rehab OT Goals Patient Stated Goal: to get better and go home OT Goal Formulation: With patient Time For Goal Achievement: 10/14/20 Potential to Achieve Goals: Good  OT Frequency: Min 2X/week   Barriers to D/C:            Co-evaluation              AM-PAC OT "6 Clicks" Daily Activity     Outcome Measure Help from another person eating meals?: None Help from another person taking care of  personal grooming?: A Little Help from another person toileting, which includes using toliet, bedpan, or urinal?: A Little Help from another person bathing (including washing, rinsing, drying)?: A Little Help from another person to put on and taking off regular upper body clothing?: A Little Help from another person to put on and taking off regular lower body clothing?: A Lot 6 Click Score: 18   End of Session Equipment Utilized During Treatment: Gait belt Nurse Communication: Mobility status  Activity Tolerance: Patient tolerated treatment well Patient left: in chair;with call bell/phone within reach;Other (comment) (in transport chair as pt soon to trasnfer units )  OT Visit Diagnosis: Other symptoms and signs involving cognitive function;Other symptoms and signs involving the nervous system (R29.898);Unsteadiness on feet (R26.81)                Time: 2951-8841 OT Time Calculation (min): 27 min Charges:  OT General Charges $OT Visit: 1 Visit OT Evaluation $OT Eval Moderate Complexity: 1 Mod OT Treatments $Self Care/Home Management : 8-22 mins  Lou Cal, OT Acute Rehabilitation Services Pager [phone] Office [place] 09/30/2020, 1:06 PM

## 2020-09-30 NOTE — Progress Notes (Addendum)
ANTICOAGULATION CONSULT NOTE - Follow Up Consult  Pharmacy Consult for IV Heparin > apixaban  Indication: Stroke, Atrial Fibrillation  Allergies  Allergen Reactions  . Contrast Media [Iodinated Diagnostic Agents]     Contrast-induced nephropathy requiring dialysis in 02/2007.  . Iodine-131 Other (See Comments)    Kidneys shut down  . Other Other (See Comments)    Beta blockers cause bradycardia  . Promethazine Other (See Comments)    Pt became drowsy and mild altered mental status    Patient Measurements: Height: 6' (182.9 cm) Weight: 85.7 kg (188 lb 15 oz) IBW/kg (Calculated) : 77.6 Heparin Dosing Weight: 85.7 kg  Vital Signs: Temp: 98.1 F (36.7 C) (11/16 0800) Temp Source: Oral (11/16 0800) BP: 160/83 (11/16 0700) Pulse Rate: 53 (11/16 0700)  Labs: Recent Labs    09/29/20 1231 09/30/20 0716  HGB 12.0*  --   HCT 38.0*  --   PLT 217  --   APTT 32  --   LABPROT 14.3  --   INR 1.2  --   HEPARINUNFRC  --  0.25*  CREATININE 1.54*  --     Estimated Creatinine Clearance: 41.3 mL/min (A) (by C-G formula based on SCr of 1.54 mg/dL (H)).   Medical History: Past Medical History:  Diagnosis Date  . Arthritis    shoulders and ribs   . Atrial fibrillation ([place])    atrial fib/ LOV [name] PA 04/11/12 EPIC, - CHEST X RAY, EKG 5/13 EPIC  . CAD (coronary artery disease)     s/p NSTEMI 04/08;  [place] 02/2007: Proximal LAD 75%, mid LAD 99%, proximal RCA 25%.  PCI:  Cypher DES to the proximal and mid LAD.  Last nuclear study 11/2011 (after a trip to the ED with CP): EF 58%, low risk study with small inferior wall infarct at the mid and basal level, no ischemia.  Last echo 01/2010: Mild LVH, EF 55-60%, mild AI, mild MR, severely dilated LA and RA  . Contrast dye induced nephropathy    Hx ARF secondary to contrast nephropathy  . Dyslipidemia   . Dysrhythmia   . Epididymitis   . History of blood transfusion   . Myocardial infarction ([place]) 2008  . Pleurisy    2012  . Pneumonia     hx of   . 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 Spinal stenosis   . Urothelial cancer ([place])    Dr. [name],  skin cancer non melanoma    Assessment: 79 yr old man presented to [facility] ED as a code stroke. Head CT was negative for acute intracranial abnormalities; pt's sx resolved rapidly. MRI showed small acute infarcts in posterior L frontal lobe white matter and L insular cortex; MRA with concerns of endoluminal thrombus. Pt has hx of atrial fibrillation, but was no on anticoagulant PTA due to hx of significant skin bruising and cost concerns. Pharmacy was consulted to dose IV heparin (no bolus).  Patient is currently on IV heparin at 1050 units/hr and initial heparin level this AM is slightly subtherapeutic at 0.25. No s/s of overt bleeding noted per RN  Goal of Therapy:  Heparin level 0.3-0.5 units/ml Monitor platelets by anticoagulation protocol: Yes   Plan:  -Increase heparin infusion to 1150 units/hr, per stroke protocol. No bolus  -Check 8-hr heparin level -Monitor daily heparin level, CBC -Monitor for signs/symptoms of bleeding  Albertina Parr, PharmD., BCPS, BCCCP Clinical Pharmacist Please refer to [facility] for unit-specific pharmacist   Addendum:  Now switching patient to apixaban. Will start apixaban 2.5 mg twice daily given age > 2 yo and SCr > 1.5. Stop IV heparin when apixaban dose is given. Will reorder Bmet to monitor SCr trend and need to adjust dose.   Albertina Parr, PharmD., BCPS, BCCCP Clinical Pharmacist Please refer to [facility] for unit-specific pharmacist

## 2020-09-30 NOTE — Discharge Instructions (Addendum)
Continue eliquis 5mg  twice a day and aspirin 81 daily Continue other medication as prescribed Follow up with cardiology Richardson Dopp on 10/21/20 Follow up with family doctor Dr. [name] on 10/15/20 Follow up with Neurology at [facility] in 4 weeks, office will call for appointment.  Information on my medicine - ELIQUIS (apixaban)  Why was Eliquis prescribed for you? Eliquis was prescribed for you to reduce the risk of a blood clot forming that can cause a stroke if you have a medical condition called atrial fibrillation (a type of irregular heartbeat).  What do You need to know about Eliquis ? Take your Eliquis TWICE DAILY - one tablet in the morning and one tablet in the evening with or without food. If you have difficulty swallowing the tablet whole please discuss with your pharmacist how to take the medication safely.  Take Eliquis exactly as prescribed by your doctor and DO NOT stop taking Eliquis without talking to the doctor who prescribed the medication.  Stopping may increase your risk of developing a stroke.  Refill your prescription before you run out.  After discharge, you should have regular check-up appointments with your healthcare provider that is prescribing your Eliquis.  In the future your dose may need to be changed if your kidney function or weight changes by a significant amount or as you get older.  What do you do if you miss a dose? If you miss a dose, take it as soon as you remember on the same day and resume taking twice daily.  Do not take more than one dose of ELIQUIS at the same time to make up a missed dose.  Important Safety Information A possible side effect of Eliquis is bleeding. You should call your healthcare provider right away if you experience any of the following: ? Bleeding from an injury or your nose that does not stop. ? Unusual colored urine (red or dark brown) or unusual colored stools (red or black). ? Unusual bruising for  unknown reasons. ? A serious fall or if you hit your head (even if there is no bleeding).  Some medicines may interact with Eliquis and might increase your risk of bleeding or clotting while on Eliquis. To help avoid this, consult your healthcare provider or pharmacist prior to using any new prescription or non-prescription medications, including herbals, vitamins, non-steroidal anti-inflammatory drugs (NSAIDs) and supplements.  This website has more information on Eliquis (apixaban): http://www.eliquis.com/eliquis/home    Atrial Fibrillation  Atrial fibrillation is a type of heartbeat that is irregular or fast. If you have this condition, your heart beats without any order. This makes it hard for your heart to pump blood in a normal way. Atrial fibrillation may come and go, or it may become a long-lasting problem. If this condition is not treated, it can put you at higher risk for stroke, heart failure, and other heart problems. What are the causes? This condition may be caused by diseases that damage the heart. They include:  High blood pressure.  Heart failure.  Heart valve disease.  Heart surgery. Other causes include:  Diabetes.  Thyroid disease.  Being overweight.  Kidney disease. Sometimes the cause is not known. What increases the risk? You are more likely to develop this condition if:  You are older.  You smoke.  You exercise often and very hard.  You have a family history of this condition.  You are a man.  You use drugs.  You drink a lot of alcohol.  You  have lung conditions, such as emphysema, pneumonia, or COPD.  You have sleep apnea. What are the signs or symptoms? Common symptoms of this condition include:  A feeling that your heart is beating very fast.  Chest pain or discomfort.  Feeling short of breath.  Suddenly feeling light-headed or weak.  Getting tired easily during activity.  Fainting.  Sweating. In some cases, there are  no symptoms. How is this treated? Treatment for this condition depends on underlying conditions and how you feel when you have atrial fibrillation. They include:  Medicines to: ? Prevent blood clots. ? Treat heart rate or heart rhythm problems.  Using devices, such as a pacemaker, to correct heart rhythm problems.  Doing surgery to remove the part of the heart that sends bad signals.  Closing an area where clots can form in the heart (left atrial appendage). In some cases, your doctor will treat other underlying conditions. Follow these instructions at home: Medicines  Take over-the-counter and prescription medicines only as told by your doctor.  Do not take any new medicines without first talking to your doctor.  If you are taking blood thinners: ? Talk with your doctor before you take any medicines that have aspirin or NSAIDs, such as ibuprofen, in them. ? Take your medicine exactly as told by your doctor. Take it at the same time each day. ? Avoid activities that could hurt or bruise you. Follow instructions about how to prevent falls. ? Wear a bracelet that says you are taking blood thinners. Or, carry a card that lists what medicines you take. Lifestyle      Do not use any products that have nicotine or tobacco in them. These include cigarettes, e-cigarettes, and chewing tobacco. If you need help quitting, ask your doctor.  Eat heart-healthy foods. Talk with your doctor about the right eating plan for you.  Exercise regularly as told by your doctor.  Do not drink alcohol.  Lose weight if you are overweight.  Do not use drugs, including cannabis. General instructions  If you have a condition that causes breathing to stop for a short period of time (apnea), treat it as told by your doctor.  Keep a healthy weight. Do not use diet pills unless your doctor says they are safe for you. Diet pills may make heart problems worse.  Keep all follow-up visits as told by your  doctor. This is important. Contact a doctor if:  You notice a change in the speed, rhythm, or strength of your heartbeat.  You are taking a blood-thinning medicine and you get more bruising.  You get tired more easily when you move or exercise.  You have a sudden change in weight. Get help right away if:   You have pain in your chest or your belly (abdomen).  You have trouble breathing.  You have side effects of blood thinners, such as blood in your vomit, poop (stool), or pee (urine), or bleeding that cannot stop.  You have any signs of a stroke. "BE FAST" is an easy way to remember the main warning signs: ? B - Balance. Signs are dizziness, sudden trouble walking, or loss of balance. ? E - Eyes. Signs are trouble seeing or a change in how you see. ? F - Face. Signs are sudden weakness or loss of feeling in the face, or the face or eyelid drooping on one side. ? A - Arms. Signs are weakness or loss of feeling in an arm. This happens suddenly and usually  on one side of the body. ? S - Speech. Signs are sudden trouble speaking, slurred speech, or trouble understanding what people say. ? T - Time. Time to call emergency services. Write down what time symptoms started.  You have other signs of a stroke, such as: ? A sudden, very bad headache with no known cause. ? Feeling like you may vomit (nausea). ? Vomiting. ? A seizure. These symptoms may be an emergency. Do not wait to see if the symptoms will go away. Get medical help right away. Call your local emergency services (911 in the [place] Do not drive yourself to the hospital. Summary  Atrial fibrillation is a type of heartbeat that is irregular or fast.  You are at higher risk of this condition if you smoke, are older, have diabetes, or are overweight.  Follow your doctor's instructions about medicines, diet, exercise, and follow-up visits.  Get help right away if you have signs or symptoms of a stroke.  Get help right away  if you cannot catch your breath, or you have chest pain or discomfort. This information is not intended to replace advice given to you by your health care provider. Make sure you discuss any questions you have with your health care provider. Document Revised: 04/25/2019 Document Reviewed: 04/25/2019 Elsevier Patient Education  [place].

## 2020-09-30 NOTE — Progress Notes (Signed)
Code Stroke CT Times 1205 call time 1215 beeper 1215 exam started 1216 exam finished 1222 exam completed in [facility] called

## 2020-10-01 ENCOUNTER — Other Ambulatory Visit (HOSPITAL_COMMUNITY): Payer: Self-pay | Admitting: Neurology

## 2020-10-01 DIAGNOSIS — I639 Cerebral infarction, unspecified: Secondary | ICD-10-CM | POA: Diagnosis not present

## 2020-10-01 LAB — BASIC METABOLIC PANEL
Anion gap: 8 (ref 5–15)
BUN: 26 mg/dL — ABNORMAL HIGH (ref 8–23)
CO2: 25 mmol/L (ref 22–32)
Calcium: 8.5 mg/dL — ABNORMAL LOW (ref 8.9–10.3)
Chloride: 106 mmol/L (ref 98–111)
Creatinine, Ser: 1.4 mg/dL — ABNORMAL HIGH (ref 0.61–1.24)
GFR, Estimated: 50 mL/min — ABNORMAL LOW (ref >60)
Glucose, Bld: 102 mg/dL — ABNORMAL HIGH (ref 70–99)
Potassium: 3.9 mmol/L (ref 3.5–5.1)
Sodium: 139 mmol/L (ref 135–145)

## 2020-10-01 LAB — CBC
HCT: 35.2 % — ABNORMAL LOW (ref 39.0–52.0)
Hemoglobin: 11 g/dL — ABNORMAL LOW (ref 13.0–17.0)
MCH: 28.6 pg (ref 26.0–34.0)
MCHC: 31.3 g/dL (ref 30.0–36.0)
MCV: 91.4 fL (ref 80.0–100.0)
Platelets: 201 10*3/uL (ref 150–400)
RBC: 3.85 MIL/uL — ABNORMAL LOW (ref 4.22–5.81)
RDW: 14.6 % (ref 11.5–15.5)
WBC: 5.7 10*3/uL (ref 4.0–10.5)
nRBC: 0 % (ref 0.0–0.2)

## 2020-10-01 LAB — T4, FREE: Free T4: 0.96 ng/dL (ref 0.61–1.12)

## 2020-10-01 MED ORDER — APIXABAN 5 MG PO TABS: 5.0000 mg | ORAL_TABLET | Freq: Two times a day (BID) | ORAL | Status: DC

## 2020-10-01 MED ORDER — APIXABAN 5 MG PO TABS
5.0000 mg | ORAL_TABLET | Freq: Two times a day (BID) | ORAL | 3 refills | Status: DC
Start: 2020-10-01 — End: 2020-10-15

## 2020-10-01 MED FILL — ELIQUIS 5 MG TABLET: 5 | 30 days supply | Qty: 60 | Fill #0

## 2020-10-01 NOTE — Discharge Summary (Signed)
Stroke Discharge Summary  Patient ID: [name] [name]   MRN: [id number]      DOB: [birth year]  Date of Admission: 09/29/2020 Date of Discharge: 10/01/2020  Attending Physician:  Rosalin Hawking, MD, Stroke MD Consultant(s):  Curbside consult Dr. Johnsie Cancel cardiology Patient's PCP:  Alycia Rossetti, MD  DISCHARGE DIAGNOSIS:  Principal Problem:   Stroke:  left frontal WM and left insular infarcts embolic secondary to AF not in [facility] Active Problems:   Mixed hyperlipidemia   Atrial fibrillation (HCC)   HTN   CKD (chronic kidney disease), stage III ([place])   CAD s/p stent   GERD (gastroesophageal reflux disease)     Allergies as of 10/01/2020      Reactions   Contrast Media [iodinated Diagnostic Agents]    Contrast-induced nephropathy requiring dialysis in 02/2007.   Iodine-131 Other (See Comments)   Kidneys shut down   Other Other (See Comments)   Beta blockers cause bradycardia   Promethazine Other (See Comments)   Pt became drowsy and mild altered mental status      Medication List    TAKE these medications   acetaminophen 650 MG CR tablet Commonly known as: TYLENOL Take 650 mg by mouth every 8 (eight) hours as needed for pain.   apixaban 5 MG Tabs tablet Commonly known as: ELIQUIS Take 1 tablet (5 mg total) by mouth 2 (two) times daily.   aspirin 81 MG chewable tablet Chew 81 mg by mouth daily.   atorvastatin 40 MG tablet Commonly known as: LIPITOR TAKE 1 TABLET EVERY EVENING   calcitRIOL 0.5 MCG capsule Commonly known as: ROCALTROL Take 0.5 mcg by mouth daily.   Fish Oil 1200 MG Caps Take 1,200 mg by mouth daily.   furosemide 40 MG tablet Commonly known as: LASIX TAKE 1 TABLET EVERY DAY   HYDROcodone-acetaminophen 5-325 MG tablet Commonly known as: Norco Take 1 tablet by mouth every 6 (six) hours as needed for moderate pain.   niacin 500 MG CR tablet Commonly known as: Niaspan Take 1 tablet (500 mg total) by mouth at bedtime.   nitroGLYCERIN 0.4 MG SL  tablet Commonly known as: Nitrostat Place 1 tablet (0.4 mg total) under the tongue every 5 (five) minutes as needed for chest pain.   polyethylene glycol 17 g packet Commonly known as: MIRALAX / GLYCOLAX Take 17 g by mouth 2 (two) times daily.   traZODone 50 MG tablet Commonly known as: DESYREL Take 0.5-1 tablets (25-50 mg total) by mouth at bedtime as needed for sleep.   vitamin B-12 500 MCG tablet Commonly known as: CYANOCOBALAMIN Take 500 mcg by mouth daily.       LABORATORY STUDIES CBC    Component Value Date/Time   WBC 5.7 10/01/2020 0322   RBC 3.85 (L) 10/01/2020 0322   HGB 11.0 (L) 10/01/2020 0322   HCT 35.2 (L) 10/01/2020 0322   PLT 201 10/01/2020 0322   MCV 91.4 10/01/2020 0322   MCH 28.6 10/01/2020 0322   MCHC 31.3 10/01/2020 0322   RDW 14.6 10/01/2020 0322   LYMPHSABS 0.8 09/29/2020 1231   MONOABS 0.7 09/29/2020 1231   EOSABS 0.1 09/29/2020 1231   BASOSABS 0.1 09/29/2020 1231   CMP    Component Value Date/Time   NA 139 10/01/2020 0322   K 3.9 10/01/2020 0322   CL 106 10/01/2020 0322   CO2 25 10/01/2020 0322   GLUCOSE 102 (H) 10/01/2020 0322   BUN 26 (H) 10/01/2020 0322   CREATININE 1.40 (H)  10/01/2020 0322   CREATININE 1.46 (H) 04/11/2020 0833   CALCIUM 8.5 (L) 10/01/2020 0322   PROT 6.9 09/29/2020 1231   ALBUMIN 3.5 09/29/2020 1231   AST 17 09/29/2020 1231   ALT 13 09/29/2020 1231   ALKPHOS 71 09/29/2020 1231   BILITOT 0.9 09/29/2020 1231   GFRNONAA 50 (L) 10/01/2020 0322   GFRNONAA 53 (L) 09/21/2018 0956   GFRAA >60 07/27/2019 0837   GFRAA 62 09/21/2018 0956   COAGS Lab Results  Component Value Date   INR 1.2 09/29/2020   INR 1.13 11/26/2011   INR 15.2 RATIO (HH) 04/19/2007   Lipid Panel    Component Value Date/Time   CHOL 110 09/30/2020 0334   TRIG 54 09/30/2020 0334   HDL 30 (L) 09/30/2020 0334   CHOLHDL 3.7 09/30/2020 0334   VLDL 11 09/30/2020 0334   LDLCALC 69 09/30/2020 0334   LDLCALC 76 04/11/2020 0833   HgbA1C  Lab  Results  Component Value Date   HGBA1C 5.9 (H) 09/30/2020   Urinalysis    Component Value Date/Time   COLORURINE STRAW (A) 09/29/2020 1454   APPEARANCEUR CLEAR 09/29/2020 1454   LABSPEC 1.006 09/29/2020 1454   PHURINE 7.0 09/29/2020 1454   GLUCOSEU NEGATIVE 09/29/2020 1454   HGBUR NEGATIVE 09/29/2020 1454   BILIRUBINUR NEGATIVE 09/29/2020 1454   KETONESUR NEGATIVE 09/29/2020 1454   PROTEINUR NEGATIVE 09/29/2020 1454   NITRITE NEGATIVE 09/29/2020 1454   LEUKOCYTESUR NEGATIVE 09/29/2020 1454   Urine Drug Screen     Component Value Date/Time   LABOPIA NONE DETECTED 09/29/2020 1454   COCAINSCRNUR NONE DETECTED 09/29/2020 1454   LABBENZ NONE DETECTED 09/29/2020 1454   AMPHETMU NONE DETECTED 09/29/2020 1454   THCU NONE DETECTED 09/29/2020 1454   LABBARB NONE DETECTED 09/29/2020 1454    Alcohol Level    Component Value Date/Time   ETH <10 09/29/2020 1231     SIGNIFICANT DIAGNOSTIC STUDIES DG Chest 1 View  Result Date: 09/29/2020 CLINICAL DATA:  Cough EXAM: CHEST  1 VIEW COMPARISON:  Radiograph 07/26/2019 FINDINGS: Increasing size of a left pleural effusion now moderate with adjacent opacity likely reflecting some passive atelectatic changes. Some increased interstitial markings, vascular congestion and cardiomegaly are similar to slightly diminished from prior though could suggest some interstitial edematous changes. No pneumothorax. No right effusion. No acute osseous or soft tissue abnormality. Degenerative changes are present in the imaged spine and shoulders. A large mineralization in the inferior recess of left shoulder is similar prior. Telemetry leads overlie the chest. IMPRESSION: 1. Increasing size of a left pleural effusion now moderate with adjacent opacity likely reflecting some passive atelectatic changes though underlying airspace disease is difficult to exclude. 2. Suspect some mild interstitial edema with appearance of cardiomegaly and vascular congestion.  Electronically Signed   By: Lovena Le M.D.   On: 09/29/2020 22:12   MR ANGIO HEAD WO CONTRAST  Result Date: 09/29/2020 CLINICAL DATA:  Neuro deficit, acute, stroke suspected. Additional history provided by scanning technologist: Right-sided weakness and slurred speech for 1 day. EXAM: MRI HEAD WITHOUT CONTRAST MRA HEAD WITHOUT CONTRAST TECHNIQUE: Multiplanar, multiecho pulse sequences of the brain and surrounding structures were obtained without intravenous contrast. Angiographic images of the head were obtained using MRA technique without contrast. COMPARISON:  Noncontrast head CT performed earlier the same day 09/29/2020. brain MRI 08/09/2014 FINDINGS: MRI HEAD FINDINGS Brain: Mild intermittent motion degradation. The axial DWI sequence is most notably affected. Mild generalized cerebral atrophy. 7 mm focus of restricted diffusion within the posterior  left frontal lobe white matter consistent with acute infarction (series 5, image 29). Additional small cortical infarct within the posterior left insula. Moderate multifocal T2/FLAIR hyperintensity within the cerebral white matter is nonspecific, but compatible with chronic small vessel ischemic disease. Small chronic left basal ganglia lacunar infarct, new from the prior MRI (series 19, image 18). No evidence of intracranial mass. No chronic intracranial blood products. No extra-axial fluid collection. No midline shift. Vascular: Focus of T2* signal loss within the posterior aspect of the left sylvian fissure likely reflecting endoluminal thrombus within a left M2 MCA branch vessel (series 16, image 14) Skull and upper cervical spine: No focal marrow lesion. Sinuses/Orbits: Visualized orbits show no acute finding. Trace ethmoid sinus mucosal thickening. MRA HEAD FINDINGS The intracranial internal carotid arteries are patent. The M1 middle cerebral arteries are patent without significant stenosis. There is occlusion of a proximal to mid M2 left MCA branch  vessel (for instance as seen on series 10, images 143-146). There was corresponding T2* signal loss at this site on the concurrently performed brain MRI likely reflecting endoluminal thrombus. There is only irregular and intermittent flow related signal seen more distally within this vessel. Apparent high-grade stenosis of a separate proximal M2 left MCA branch vessel (series 1064, image 6). No right M2 proximal branch occlusion or high-grade proximal stenosis. The anterior cerebral arteries are patent. 2 mm inferiorly projecting vascular protrusion arising from the supraclinoid right ICA which may reflect a small aneurysm or infundibulum (series 10, image 105). The intracranial vertebral arteries are patent. The basilar artery is patent. The posterior cerebral arteries are patent. High-grade stenoses within the bilateral posterior cerebral arteries at the P3/P4 junctions. These results were called by telephone at the time of interpretation on 09/29/2020 at 2:40 pm to provider [facility] , who verbally acknowledged these results. IMPRESSION: MRI brain: 1. Mildly motion degraded exam. 2. Small acute infarcts within the posterior left frontal lobe white matter and left insular cortex. 3. Mild cerebral atrophy with moderate chronic small vessel ischemic disease, progressed as compared to the MRI of 08/09/2014. 4. A small chronic left basal ganglia lacunar infarct is new from this prior MRI. MRA head: 1. 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 T2* signal loss also present at this site, likely reflecting endoluminal thrombus. 2. High-grade proximal stenosis within a separate M2 left MCA branch vessel. 3. Apparent high-grade stenoses within the bilateral posterior cerebral arteries at the P3/P4 junctions. However, these apparent stenoses may be accentuated by artifact on the current exam. 4. 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 Electronically Signed   By: Kellie Simmering DO   On: 09/29/2020 14:47   MR ANGIO NECK W WO CONTRAST  Result Date: 09/29/2020 CLINICAL DATA:  Neuro deficit, acute, stroke suspected. Additional history provided by scanning technologist: Right-sided weakness and slurred speech for 1 day. EXAM: MRA NECK WITHOUT AND WITH CONTRAST TECHNIQUE: Multiplanar and multiecho pulse sequences of the neck were obtained without and with intravenous contrast. Angiographic images of the neck were obtained using MRA technique without and with intravenous contrast. CONTRAST:  41mL GADAVIST GADOBUTROL 1 MMOL/ML IV SOLN COMPARISON:  Noncontrast head CT 09/29/2020.  Brain MRI 08/09/2014. FINDINGS: The left vertebral artery appears to arise directly from the aortic arch. The visualized aortic arch is otherwise unremarkable. No hemodynamically significant innominate or proximal subclavian artery stenosis. The bilateral common and internal carotid arteries are patent within the neck without hemodynamically significant stenosis. The  vertebral arteries are patent within the neck with antegrade flow bilaterally. The right vertebral artery is dominant. There is apparent at least moderate stenosis within the V1 left vertebral artery. Moderate stenosis within the left vertebral artery more distally at the V3/V4 junction. IMPRESSION: The common and internal carotid arteries are patent within the neck without hemodynamically significant stenosis. The vertebral arteries are patent within the neck with antegrade flow. At least moderate stenosis of the non-dominant V1 left vertebral artery (which arises directly from the aortic arch). A moderate stenosis is also present more distally within this vessel at the V3/V4 junction. Electronically Signed   By: Kellie Simmering DO   On: 09/29/2020 15:02   MR BRAIN WO CONTRAST  Result Date: 09/29/2020 CLINICAL DATA:  Neuro deficit, acute, stroke suspected. Additional history provided by scanning technologist: Right-sided weakness and slurred  speech for 1 day. EXAM: MRI HEAD WITHOUT CONTRAST MRA HEAD WITHOUT CONTRAST TECHNIQUE: Multiplanar, multiecho pulse sequences of the brain and surrounding structures were obtained without intravenous contrast. Angiographic images of the head were obtained using MRA technique without contrast. COMPARISON:  Noncontrast head CT performed earlier the same day 09/29/2020. brain MRI 08/09/2014 FINDINGS: MRI HEAD FINDINGS Brain: Mild intermittent motion degradation. The axial DWI sequence is most notably affected. Mild generalized cerebral atrophy. 7 mm focus of restricted diffusion within the posterior left frontal lobe white matter consistent with acute infarction (series 5, image 29). Additional small cortical infarct within the posterior left insula. Moderate multifocal T2/FLAIR hyperintensity within the cerebral white matter is nonspecific, but compatible with chronic small vessel ischemic disease. Small chronic left basal ganglia lacunar infarct, new from the prior MRI (series 19, image 18). No evidence of intracranial mass. No chronic intracranial blood products. No extra-axial fluid collection. No midline shift. Vascular: Focus of T2* signal loss within the posterior aspect of the left sylvian fissure likely reflecting endoluminal thrombus within a left M2 MCA branch vessel (series 16, image 14) Skull and upper cervical spine: No focal marrow lesion. Sinuses/Orbits: Visualized orbits show no acute finding. Trace ethmoid sinus mucosal thickening. MRA HEAD FINDINGS The intracranial internal carotid arteries are patent. The M1 middle cerebral arteries are patent without significant stenosis. There is occlusion of a proximal to mid M2 left MCA branch vessel (for instance as seen on series 10, images 143-146). There was corresponding T2* signal loss at this site on the concurrently performed brain MRI likely reflecting endoluminal thrombus. There is only irregular and intermittent flow related signal seen more distally  within this vessel. Apparent high-grade stenosis of a separate proximal M2 left MCA branch vessel (series 1064, image 6). No right M2 proximal branch occlusion or high-grade proximal stenosis. The anterior cerebral arteries are patent. 2 mm inferiorly projecting vascular protrusion arising from the supraclinoid right ICA which may reflect a small aneurysm or infundibulum (series 10, image 105). The intracranial vertebral arteries are patent. The basilar artery is patent. The posterior cerebral arteries are patent. High-grade stenoses within the bilateral posterior cerebral arteries at the P3/P4 junctions. These results were called by telephone at the time of interpretation on 09/29/2020 at 2:40 pm to provider [facility] , who verbally acknowledged these results. IMPRESSION: MRI brain: 1. Mildly motion degraded exam. 2. Small acute infarcts within the posterior left frontal lobe white matter and left insular cortex. 3. Mild cerebral atrophy with moderate chronic small vessel ischemic disease, progressed as compared to the MRI of 08/09/2014. 4. A small chronic left basal ganglia lacunar infarct is new from this  prior MRI. MRA head: 1. 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 T2* signal loss also present at this site, likely reflecting endoluminal thrombus. 2. High-grade proximal stenosis within a separate M2 left MCA branch vessel. 3. Apparent high-grade stenoses within the bilateral posterior cerebral arteries at the P3/P4 junctions. However, these apparent stenoses may be accentuated by artifact on the current exam. 4. 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 Electronically Signed   By: Kellie Simmering DO   On: 09/29/2020 14:47   ECHOCARDIOGRAM COMPLETE  Result Date: 09/30/2020    ECHOCARDIOGRAM REPORT   Patient Name:   DONTARIOUS SCHAUM Date of Exam: 09/30/2020 Medical Rec #:  [id number]  Height:       72.0 in Accession #:    [id number] Weight:       188.9 lb Date of  Birth:  July 18, 1941 BSA:          2.080 m Patient Age:    59 years   BP:           160/83 mmHg Patient Gender: M          HR:           51 bpm. Exam Location:  Inpatient Procedure: 2D Echo, Cardiac Doppler and Color Doppler Indications:    Stroke 434.91 / I163.9  History:        Patient has prior history of Echocardiogram examinations, most                 recent 02/11/2010. Previous Myocardial Infarction and CAD, TIA,                 Arrythmias:Atrial Fibrillation; Risk Factors:Dyslipidemia and                 Former Smoker.  Sonographer:    Vickie Epley RDCS Referring Phys: [id number] ASHISH ARORA IMPRESSIONS  1. There is a small, independently mobile echodensity on the ventricular aspect aortic valve posteriorly. Not well visualized in other echo views. In setting of stroke, this may need to be further evaluated (clip 7).. The aortic valve is tricuspid. There is mild calcification of the aortic valve. Aortic valve regurgitation is trivial. No aortic stenosis is present.  2. Left ventricular ejection fraction, by estimation, is 55%. The left ventricle has normal function. The left ventricle has no regional wall motion abnormalities. Left ventricular diastolic parameters are indeterminate.  3. Right ventricular systolic function is mildly reduced. The right ventricular size is moderately enlarged. There is mildly elevated pulmonary artery systolic pressure. The estimated right ventricular systolic pressure is 63.0 mmHg.  4. Left atrial size was moderately dilated.  5. Right atrial size was severely dilated.  6. The mitral valve is normal in structure. Mild mitral valve regurgitation. No evidence of mitral stenosis.  7. There is mild dilatation of the ascending aorta, measuring 43 mm.  8. The inferior vena cava is dilated in size with <50% respiratory variability, suggesting right atrial pressure of 15 mmHg. FINDINGS  Left Ventricle: Left ventricular ejection fraction, by estimation, is 55%. The left ventricle has normal  function. The left ventricle has no regional wall motion abnormalities. The left ventricular internal cavity size was normal in size. There is no left ventricular hypertrophy. Left ventricular diastolic parameters are indeterminate. Right Ventricle: The right ventricular size is moderately enlarged. No increase in right ventricular wall thickness. Right ventricular systolic function is mildly reduced. There is mildly elevated pulmonary artery systolic pressure. The  tricuspid regurgitant velocity is 2.40 m/s, and with an assumed right atrial pressure of 15 mmHg, the estimated right ventricular systolic pressure is 85.4 mmHg. Left Atrium: Left atrial size was moderately dilated. Right Atrium: Right atrial size was severely dilated. Pericardium: There is no evidence of pericardial effusion. Mitral Valve: The mitral valve is normal in structure. Mild mitral valve regurgitation. No evidence of mitral valve stenosis. Tricuspid Valve: The tricuspid valve is normal in structure. Tricuspid valve regurgitation is not demonstrated. No evidence of tricuspid stenosis. Aortic Valve: There is a small, independently mobile echodensity on the ventricular aspect aortic valve posteriorly. Not well visualized in other echo views. In setting of stroke, this may need to be further evaluated (clip 7). The aortic valve is tricuspid. There is mild calcification of the aortic valve. Aortic valve regurgitation is trivial. No aortic stenosis is present. Pulmonic Valve: The pulmonic valve was not well visualized. Pulmonic valve regurgitation is not visualized. No evidence of pulmonic stenosis. Aorta: The aortic root is normal in size and structure. There is mild dilatation of the ascending aorta, measuring 43 mm. Venous: The inferior vena cava is dilated in size with less than 50% respiratory variability, suggesting right atrial pressure of 15 mmHg. IAS/Shunts: No atrial level shunt detected by color flow Doppler.  LEFT VENTRICLE PLAX 2D LVIDd:          4.90 cm LVIDs:         4.00 cm LV PW:         1.00 cm LV IVS:        1.00 cm LVOT diam:     2.30 cm LV SV:         74 LV SV Index:   35 LVOT Area:     4.15 cm  RIGHT VENTRICLE TAPSE (M-mode): 1.7 cm LEFT ATRIUM             Index       RIGHT ATRIUM           Index LA diam:        4.90 cm 2.36 cm/m  RA Area:     31.70 cm LA Vol (A2C):   72.7 ml 34.96 ml/m RA Volume:   124.00 ml 59.63 ml/m LA Vol (A4C):   71.0 ml 34.14 ml/m LA Biplane Vol: 72.5 ml 34.86 ml/m  AORTIC VALVE LVOT Vmax:   77.70 cm/s LVOT Vmean:  55.900 cm/s LVOT VTI:    0.177 m  AORTA Ao Root diam: 3.90 cm Ao Asc diam:  4.30 cm MR Peak grad: 54.2 mmHg   TRICUSPID VALVE MR Vmax:      368.00 cm/s TR Peak grad:   23.0 mmHg                           TR Vmax:        240.00 cm/s                            SHUNTS                           Systemic VTI:  0.18 m                           Systemic Diam: 2.30 cm Cherlynn Kaiser MD Electronically signed by Cherlynn Kaiser MD Signature Date/Time: 09/30/2020/11:03:36 AM  Final    CT HEAD CODE STROKE WO CONTRAST  Result Date: 09/29/2020 CLINICAL DATA:  Code stroke. Neuro deficit, acute stroke suspected. Right-sided weakness. EXAM: CT HEAD WITHOUT CONTRAST TECHNIQUE: Contiguous axial images were obtained from the base of the skull through the vertex without intravenous contrast. COMPARISON:  None. FINDINGS: Brain: No evidence of acute large vascular territory infarction, hemorrhage, hydrocephalus, extra-axial collection or mass lesion/mass effect. Patchy white matter hypoattenuation is nonspecific, but most likely secondary to chronic microvascular ischemic disease. Mild generalized atrophy with ex vacuo ventricular dilation. Vascular: No hyperdense vessel or unexpected calcification. Calcific atherosclerosis. Skull: No acute fracture. Sinuses/Orbits: No acute finding. Other: No mastoid effusions. ASPECTS [facility] Stroke Program Early CT Score) total score (0-10 with 10 being normal): 10 IMPRESSION:  1. No evidence of acute intracranial abnormality.   ASPECTS is 10. 2. 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 Code stroke imaging results were communicated on 09/29/2020 at 12:27 pm to provider Dr. Gilford Raid via telephone, who verbally acknowledged these results. Electronically Signed   By: Margaretha Sheffield MD   On: 09/29/2020 12:30      HISTORY OF PRESENT ILLNESS Oliverio Cho is a 79 y.o. male atrial fibrillation not on anticoagulation, dyslipidemia, coronary artery disease, history of myocardial infarction, dyslipidemia, presenting to the emergency room at [facility] this afternoon and was seen by telemedicine neurology-see Dr. Lyn Records note from earlier in the day. Patient given with complaints of difficulty communicating and severe right face arm and leg weakness.  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  He was given his usual order by the server but he could barely walk or hold his coffee because of severe right-sided weakness.  When he was seen by telemedicine neurology, symptoms are completely resolved-NIH 0. MR brain was done and showed small acute infarct within the posterior left frontal lobe and left insular cortex.  Small chronic left basal ganglia lacunar infarct seen. MRA head showed occlusion of proximal to mid M2 left MCA branch with T2 star signal loss Likely reflecting intraluminal thrombus.  High-grade stenosis proximally within a separate left MCA M2 branch.  High-grade stenosis of bilateral PCAs at P3 P4 junctions-might be artifactual. 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 Patient started on heparin drip due to fluctuating symptoms and presence of a possible intraluminal thrombus in the M2 and transferred to [facility]-accepted by neuro hospitalist to be admitted to the stroke service in the neurological ICU. Currently remains asymptomatic.  No complaints.  Was able to provide  history. See detailed exam below.  Has a history of contrast-induced nephropathy when he got contrast for cardiac cath.  Has been listed as allergy in his chart.  Confirmed that it was nephropathy.  Amenable to proceed with CT perfusion showed symptoms worsen and consideration for endovascular thrombectomy be made.  Detailed discussion with the telemedicine neurologist and family-documented in the consult note from earlier this afternoon.  LKW: 11:50 AM 09/29/2020 tpa given?: no, NIH 0 Premorbid modified Rankin scale (mRS):0  HOSPITAL COURSE Mr. Rondey Fallen is a 79 y.o. male with history of afib, CAD, HLD, right nephrectomy admitted for right side weakness and aphasia. No tPA given due to resolved symptoms.    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 chronic left BG infarct  MRA head and neck left M2 occlusion and another left M2 branch high grade stenosis. B/l P3/4 stenosis  2D Echo  EF 55%.  There is a small independent mobile echodensity on the ventricular aspect of aortic valve.  LDL 69  HgbA1c 5.9  Heparin IV for VTE prophylaxis  aspirin 81 mg daily prior to admission, now on Eliquis. Continue ASA 81 due to CAD s/p LAD stenting  Patient counseled to be compliant with his antithrombotic medications  Ongoing aggressive stroke risk factor management  Therapy recommendations: None  Disposition: Likely home tomorrow  Permanent afib not on [facility]  Per patient, he was on Xarelto for brief time, however not able to continue due to cost   He has declined DOAC in his recent visit with Dr. Burt Knack in 10/2019  Currently rate controlled  Patient waiting to restart anticoagulation  Switch heparin IV to Eliquis  TTE showed small independent mobile echodensity and ventricular aspect of aortic valve  Curbside cardiology Dr. Johnsie Cancel, no TEE  needed.  Has appointment with Richardson Dopp PA on 10/21/20  Hypertension  Stable  Resume home meds  Long term BP goal normotensive  Hyperlipidemia  Home meds:  lipitor 40   LDL 69, goal < 70  Now on lipitor 40  Continue statin at discharge  Other Stroke Risk Factors  Advanced age  former cigarette smoker - quit 38 years ago  ETOH use, recommend no more than 1 drink per day  Hx stroke/TIA by imaging  Coronary artery disease s/p LAD stenting  Other Active Problems  AKI on CKD IIIa - Cre 1.54->1.40   DISCHARGE EXAM Blood pressure (!) 156/91, pulse 63, temperature 98.1 F (36.7 C), temperature source Oral, resp. rate 18, height 6' (1.829 m), weight 85.3 kg, SpO2 99 %.  General - Well nourished, well developed, in no apparent distress.  Ophthalmologic - fundi not visualized due to noncooperation.  Cardiovascular - irregularly irregular heart rate and rhythm.  Mental Status -  Level of arousal and orientation to time, place, and person were intact. Language including expression, naming, repetition, comprehension was assessed and found intact. Fund of Knowledge was assessed and was intact.  Cranial Nerves II - XII - II - Visual field intact OU. III, IV, VI - Extraocular movements intact. V - Facial sensation intact bilaterally. VII - Facial movement intact bilaterally. VIII - Hearing & vestibular intact bilaterally. Hard of hearing.  X - Palate elevates symmetrically. XI - Chin turning & shoulder shrug intact bilaterally. XII - Tongue protrusion intact.  Motor Strength - The patient's strength was normal in all extremities and pronator drift was absent.  Bulk was normal and fasciculations were absent.   Motor Tone - Muscle tone was assessed at the neck and appendages and was normal.  Reflexes - The patient's reflexes were symmetrical in all extremities and he had no pathological reflexes.  Sensory - Light touch, temperature/pinprick were assessed  and were symmetrical.    Coordination - The patient had normal movements in the hands with no ataxia or dysmetria.  Tremor was absent.  Gait and Station - deferred.   Discharge Diet       Diet   Diet heart healthy/carb modified Room service appropriate? Yes; Fluid consistency: Thin   liquids  DISCHARGE PLAN  Disposition: home  Eliquis (apixaban) daily for secondary stroke prevention   Ongoing stroke risk factor control by Primary Care Physician at time of discharge  Follow-up PCP Alycia Rossetti, MD on 10/15/20.  Follow-up in [facility] in 4 weeks, office to schedule an appointment.   Follow up with Richardson Dopp on 10/21/20  35 minutes were spent preparing discharge.  Rosalin Hawking, MD PhD Stroke Neurology 10/01/2020 1:40 PM

## 2020-10-01 NOTE — Evaluation (Signed)
Speech Language Pathology Evaluation Patient Details Name: [name] [name] MRN: [id number] DOB: [birth year] Today's Date: 10/01/2020 Time: 4098-1191 SLP Time Calculation (min) (ACUTE ONLY): 22 min  Problem List:  Patient Active Problem List   Diagnosis Date Noted  . TIA (transient ischemic attack) 09/29/2020  . Acute ischemic stroke ([place]) 09/29/2020  . Stroke (cerebrum) ([place]) 09/29/2020  . Closed fracture of rib of left side with delayed healing 07/25/2019  . Insomnia 07/04/2019  . Gallstones 05/30/2019  . Compression fracture of L3 lumbar vertebra, with routine healing, subsequent encounter 05/30/2019  . S/P right total knee arthroplasty 04/03/2019  . Osteoarthritis of right knee 04/03/2019  . Anemia 01/10/2019  . Chest pain 09/12/2018  . Constipation 09/10/2018  . Overweight (BMI 25.0-29.9) 09/06/2018  . S/P left TKA 09/05/2018  . 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 Elevated PSA 09/14/2017  . Primary osteoarthritis of left knee 01/27/2017  . OA (osteoarthritis) of knee 01/26/2017  . Spinal stenosis of lumbar region 12/08/2016  . GERD (gastroesophageal reflux disease) 11/26/2011  . Mixed hyperlipidemia 10/05/2008  . CAD (coronary artery disease) 10/05/2008  . Atrial fibrillation ([place]) 10/05/2008  . CKD (chronic kidney disease), stage III () 10/05/2008   Past Medical History:  Past Medical History:  Diagnosis Date  . Arthritis    shoulders and ribs   . Atrial fibrillation ([place])    atrial fib/ LOV [name] PA 04/11/12 EPIC, - CHEST X RAY, EKG 5/13 EPIC  . CAD (coronary artery disease)     s/p NSTEMI 04/08;  [place] 02/2007: Proximal LAD 75%, mid LAD 99%, proximal RCA 25%.  PCI:  Cypher DES to the proximal and mid LAD.  Last nuclear study 11/2011 (after a trip to the ED with CP): EF 58%, low risk study with small inferior wall infarct at the mid and basal level, no ischemia.  Last echo 01/2010: Mild LVH, EF 55-60%, mild AI, mild MR, severely dilated LA and RA  . Contrast dye induced  nephropathy    Hx ARF secondary to contrast nephropathy  . Dyslipidemia   . Dysrhythmia   . Epididymitis   . History of blood transfusion   . Myocardial infarction ([place]) 2008  . Pleurisy    2012  . Pneumonia    hx of   . 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 Spinal stenosis   . 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 [name],  skin cancer non melanoma   Past Surgical History:  Past Surgical History:  Procedure Laterality Date  . arm surgery     orif right elbow  . CARDIAC CATHETERIZATION  2008  . coronary stents     . CYSTECTOMY  07/15/08  . CYSTOSCOPY WITH BIOPSY  03/23/2012   Procedure: CYSTOSCOPY WITH BIOPSY;  Surgeon: [name], MD;  Location: [facility];  Service: Urology;  Laterality: Right;   BRUSH BIOPSY RIGHT URETERAL STENT  . CYSTOSCOPY WITH URETEROSCOPY  03/23/2012   Procedure: CYSTOSCOPY WITH URETEROSCOPY;  Surgeon: [name], MD;  Location: [facility];  Service: Urology;  Laterality: Right;    **OR Room #8 requested**  C-ARM   . CYSTOSCOPY/RETROGRADE/URETEROSCOPY  02/15/2012   Procedure: CYSTOSCOPY/RETROGRADE/URETEROSCOPY;  Surgeon: [name], MD;  Location: [facility];  Service: Urology;  Laterality: Right;  C-ARM  . JOINT REPLACEMENT     Dr. [name] 09-05-18   . left pinky finger      tip removed from accident  . right kidney removed      2013  . TOTAL  KNEE ARTHROPLASTY Left 09/05/2018   Procedure: LEFT TOTAL KNEE ARTHROPLASTY;  Surgeon: [name], MD;  Location: [facility];  Service: Orthopedics;  Laterality: Left;  70 mins with abductor block  . TOTAL KNEE ARTHROPLASTY Right 04/03/2019   Procedure: RIGHT TOTAL KNEE ARTHROPLASTY;  Surgeon: [name], MD;  Location: [facility];  Service: Orthopedics;  Laterality: Right;  70 mins  . URETER SURGERY     HPI:  Patient is an 79 y/o male with PMH atrial fibrillation, dyslipidemia, coronary artery disease, myocardial infarction, dyslipidemia, who presented to the ED at [facility] secondary to complaints of difficulty communicating  and severe right face arm and leg weakness and was transferred to [facility] for workup. Brain MRI: Small acute infarcts within the posterior left frontal lobe white matter and left insular cortex. Brain MRA: Occlusion of a proximal to mid M2 left MCA branch vessel.    Assessment / Plan / Recommendation Clinical Impression  Pt participated in speech/language evaluation. He reported that he has baseline difficulty with memory and attention. Per the pt, he "has [his] good days and bad days but still has [his] wits about [him]". Per the pt he repeats tasks which he need to recall and writes down things which he deems to be important. Pt has a hearing impairment and a clear mask was used during this evaluation to assist with speech perception; however, repetition was still needed and the impact of his hearing on his performance is considered. His speech and language skills are currently within normal limits. His cognitive-linguistic skills were significant for difficulty with memory and higher-level cognitive-linguistic tasks. However, pt reported that this is at baseline and they appeared functional considering his level of support at home. Further skilled SLP services are not clinically indicated at this time. Pt and nursing were educated regarding results and recommendations; both parties verbalized understanding as well as agreement with plan of care.    SLP Assessment  SLP Recommendation/Assessment: Patient does not need any further Speech Lanaguage Pathology Services SLP Visit Diagnosis: Aphasia (R47.01)    Follow Up Recommendations  None    Frequency and Duration           SLP Evaluation Cognition  Overall Cognitive Status: No family/caregiver present to determine baseline cognitive functioning (Pt reports this is his baselien level of functioning) Arousal/Alertness: Awake/alert Orientation Level: Oriented X4 Attention: Focused;Sustained Focused Attention: Appears intact Sustained  Attention: Appears intact Memory: Impaired Memory Impairment: Retrieval deficit;Decreased recall of new information (Immediate: 3/3; delayed: 1/3; with: 2/2) Awareness: Appears intact       Comprehension  Auditory Comprehension Overall Auditory Comprehension: Appears within functional limits for tasks assessed Yes/No Questions: Within Functional Limits Basic Biographical Questions:  (5/53) Complex Questions:  (5/5) Paragraph Comprehension (via yes/no questions):  (2/4) Commands: Within Functional Limits Two Step Basic Commands:  (4/4) Multistep Basic Commands:  (3/3 with repetition) Conversation: Complex Interfering Components: Hearing    Expression Expression Primary Mode of Expression: Verbal Verbal Expression Overall Verbal Expression: Appears within functional limits for tasks assessed Initiation: No impairment Automatic Speech: Counting;Day of week;Month of year (WNL) Level of Generative/Spontaneous Verbalization: Conversation Repetition: No impairment (4/4 with minor errors d/t hearing ) Naming: No impairment Responsive:  (5/5) Confrontation: Within functional limits (10/10) Convergent:  (5/5) Pragmatics: Impairment Impairments: Abnormal affect   Oral / Motor  Oral Motor/Sensory Function Overall Oral Motor/Sensory Function: Within functional limits Motor Speech Overall Motor Speech: Appears within functional limits for tasks assessed Respiration: Within functional limits Phonation: Normal Resonance:  Within functional limits Articulation: Within functional limitis Intelligibility: Intelligible Motor Planning: Witnin functional limits   Jossalyn Forgione I. Hardin Negus, [place], [place] Office number [phone] Pager [place] 10/01/2020, 10:17 AM

## 2020-10-01 NOTE — Progress Notes (Signed)
Occupational Therapy Treatment Patient Details Name: [name] [name] MRN: [id number] DOB: 1941-06-20 Today's Date: 10/01/2020    History of present illness Patient is a 79 y/o male who presents with right sided weakness, facial droop and aphasia. 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 PMH includes uroepithelial cancer, spinal stenosis, MI, dyslipidemia, CAD, A-fib.   OT comments  Patient supine in bed when therapist entered the room. Patient demonstrates functional cognition though he does report some mild memory deficits. Patient reports managing his own medication at home - using his bottles and a written list. Patient able to state the number of medications he is on and what they are for - as well as the new medication the doctor is prescribing him - they not his medication names. Patient's wife able to assist with medication management if needed. Patient demonstrated ability to ambulate in room and hall and perform higher level balance tasks without loss of balance or use of device. Patient able to remember and  perform 2 step commands. Patient will have assistance of wife at home if needed. Patient appears to be near his baseline in regards to cognition and functional abilities. Recommend return home with spouse. Will continue to see acutely to continue to assess modified independence with ADLs.   Follow Up Recommendations  No OT follow up;Supervision/Assistance - 24 hour    Equipment Recommendations  None recommended by OT    Recommendations for Other Services      Precautions / Restrictions Precautions Precautions: None       Mobility Bed Mobility Overal bed mobility: Modified Independent Bed Mobility: Supine to Sit;Sit to Supine     Supine to sit: HOB elevated;Modified independent (Device/Increase time) Sit to supine: Modified independent  (Device/Increase time)   General bed mobility comments: use of bed rail for transfer out of bed.  Transfers Overall transfer level: Needs assistance Equipment used: None Transfers: Sit to/from Stand Sit to Stand: Min guard         General transfer comment: Min guard for ambulation in room and in hall with no device - for safety.    Balance Overall balance assessment: History of Falls Sitting-balance support: Feet supported;No upper extremity supported Sitting balance-Leahy Scale: Good     Standing balance support: No upper extremity supported;During functional activity Standing balance-Leahy Scale: Good Standing balance comment: Min guard for safety. Patient able to ambulate in hall without device, perform head turns with ambulation, walk fast and slow, perform step over with each leg - no loss of balance during each task.                           ADL either performed or assessed with clinical judgement   ADL Overall ADL's : Needs assistance/impaired                       Lower Body Dressing Details (indicate cue type and reason): Patient wearing pants and reports donning them himself.                     Vision Baseline Vision/History: Wears glasses Wears Glasses: Distance only Patient Visual Report: No change from baseline     Perception     Praxis      Cognition Arousal/Alertness: Awake/alert Behavior During Therapy: WFL for tasks assessed/performed Overall Cognitive Status:  Within Functional Limits for tasks assessed                                 General Comments: Patient reports some memory deficits - but states "that's what happens when you get older." Patient able to answer orientation questions, state how many medications he is on, report the new medications the doctor is prescribing him. Reports he manages his own medication at home using a written out list. Patient's wife in room at end of session and reports he  does have memory deficits at baseline and she is there to assist with medication if needed.        Exercises     Shoulder Instructions       General Comments      Pertinent Vitals/ Pain       Pain Assessment: No/denies pain  Home Living     Available Help at Discharge: Family;Available 24 hours/day Type of Home: House                              Lives With: Spouse    Prior Functioning/Environment              Frequency  Min 2X/week        Progress Toward Goals  OT Goals(current goals can now be found in the care plan section)  Progress towards OT goals: Progressing toward goals  Acute Rehab OT Goals Patient Stated Goal: to get better and go home OT Goal Formulation: With patient Time For Goal Achievement: 10/14/20 Potential to Achieve Goals: Good  Plan Discharge plan remains appropriate    Co-evaluation                 AM-PAC OT "6 Clicks" Daily Activity     Outcome Measure   Help from another person eating meals?: None Help from another person taking care of personal grooming?: None Help from another person toileting, which includes using toliet, bedpan, or urinal?: None Help from another person bathing (including washing, rinsing, drying)?: A Little Help from another person to put on and taking off regular upper body clothing?: None Help from another person to put on and taking off regular lower body clothing?: A Little 6 Click Score: 22    End of Session Equipment Utilized During Treatment: Gait belt  OT Visit Diagnosis: Other symptoms and signs involving cognitive function;Other symptoms and signs involving the nervous system (R29.898);Unsteadiness on feet (R26.81)   Activity Tolerance Patient tolerated treatment well   Patient Left with call bell/phone within reach;in chair;with bed alarm set;with family/visitor present   Nurse Communication Mobility status        Time: 1050-1111 OT Time Calculation (min): 21  min  Charges: OT General Charges $OT Visit: 1 Visit OT Treatments $Therapeutic Activity: 8-22 mins  Gina Leblond, OTR/L [place]  Office [phone] Pager: [place] 10/01/2020, 12:42 PM

## 2020-10-01 NOTE — TOC Transition Note (Addendum)
Transition of Care [facility]) - CM/SW Discharge Note   Patient Details  Name: [name] [name] MRN: [id number] Date of Birth: 1941/06/05  [facility] CM/SW Contact:  Pollie Friar, RN Phone Number: 10/01/2020, 10:53 AM   Clinical Narrative:    CM provided pt with co pays cost for Eliquis and he feels he can afford this but is interested in the patient assistance through the company. CM provided him the pt assistance forms.  CM asked MD to send d/c meds to [facility] where they will apply the 30 day free coupon for the Eliquis.  Wife to provide supervision at home and transportation to home when medically ready for d/c.   Final next level of care: Home/Self Care Barriers to Discharge: No Barriers Identified   Patient Goals and CMS Choice        Discharge Placement                       Discharge Plan and Services                                     Social Determinants of Health (SDOH) Interventions     Readmission Risk Interventions No flowsheet data found.

## 2020-10-01 NOTE — Plan of Care (Signed)
  Problem: Education: Goal: Knowledge of disease or condition will improve Outcome: Adequate for Discharge Goal: Knowledge of secondary prevention will improve Outcome: Adequate for Discharge Goal: Knowledge of patient specific risk factors addressed and post discharge goals established will improve Outcome: Adequate for Discharge Goal: Individualized Educational Video(s) Outcome: Adequate for Discharge   Problem: Coping: Goal: Will verbalize positive feelings about self Outcome: Adequate for Discharge Goal: Will identify appropriate support needs Outcome: Adequate for Discharge   Problem: Health Behavior/Discharge Planning: Goal: Ability to manage health-related needs will improve Outcome: Adequate for Discharge   Problem: Self-Care: Goal: Ability to participate in self-care as condition permits will improve Outcome: Adequate for Discharge Goal: Verbalization of feelings and concerns over difficulty with self-care will improve Outcome: Adequate for Discharge Goal: Ability to communicate needs accurately will improve Outcome: Adequate for Discharge   Problem: Nutrition: Goal: Risk of aspiration will decrease Outcome: Adequate for Discharge Goal: Dietary intake will improve Outcome: Adequate for Discharge   Problem: Ischemic Stroke/TIA Tissue Perfusion: Goal: Complications of ischemic stroke/TIA will be minimized Outcome: Adequate for Discharge   

## 2020-10-01 NOTE — Progress Notes (Signed)
Per telemetry HR in mid 30's nonsustained while pt sleeping. Dr. Rory Percy updated.

## 2020-10-01 NOTE — Progress Notes (Signed)
Physical Therapy Discharge Patient Details Name: [name] [name] MRN: [id number] DOB: 04-06-1941 Today's Date: 10/01/2020 Time:  -     Patient discharged from PT services secondary to Pt and pt's wife requested no treatment this date along with no further PT services as they report pt is at his baseline function and they feel comfortable and safe with functional mobility required at home. Followed up with OT that treated pt this date who reported pt did well with gait and felt he was at his baseline. Pt and pt's wife acknowledged understanding of request to be discharged from PT.Marland Kitchen  Please see latest therapy progress note for current level of functioning and progress toward goals.    Progress and discharge plan discussed with patient and/or caregiver: Patient/Caregiver agrees with plan  GP    Moishe Spice, PT, DPT Acute Rehabilitation Services  Pager: [phone] Office: [place] 10/01/2020, 2:56 PM

## 2020-10-02 DIAGNOSIS — N2581 Secondary hyperparathyroidism of renal origin: Secondary | ICD-10-CM | POA: Diagnosis not present

## 2020-10-02 DIAGNOSIS — N183 Chronic kidney disease, stage 3 unspecified: Secondary | ICD-10-CM | POA: Diagnosis not present

## 2020-10-02 DIAGNOSIS — D631 Anemia in chronic kidney disease: Secondary | ICD-10-CM | POA: Diagnosis not present

## 2020-10-02 DIAGNOSIS — I129 Hypertensive chronic kidney disease with stage 1 through stage 4 chronic kidney disease, or unspecified chronic kidney disease: Secondary | ICD-10-CM | POA: Diagnosis not present

## 2020-10-06 DIAGNOSIS — C661 Malignant neoplasm of right ureter: Secondary | ICD-10-CM | POA: Diagnosis not present

## 2020-10-15 ENCOUNTER — Other Ambulatory Visit: Payer: Self-pay

## 2020-10-15 ENCOUNTER — Ambulatory Visit (INDEPENDENT_AMBULATORY_CARE_PROVIDER_SITE_OTHER): Payer: Medicare HMO | Admitting: Family Medicine

## 2020-10-15 ENCOUNTER — Encounter: Payer: Self-pay | Admitting: Family Medicine

## 2020-10-15 VITALS — BP 136/82 | HR 74 | Temp 98.8°F | Resp 14 | Ht 72.0 in | Wt 191.0 lb

## 2020-10-15 DIAGNOSIS — I2510 Atherosclerotic heart disease of native coronary artery without angina pectoris: Secondary | ICD-10-CM

## 2020-10-15 DIAGNOSIS — Z0001 Encounter for general adult medical examination with abnormal findings: Secondary | ICD-10-CM | POA: Diagnosis not present

## 2020-10-15 DIAGNOSIS — G4701 Insomnia due to medical condition: Secondary | ICD-10-CM

## 2020-10-15 DIAGNOSIS — Z8673 Personal history of transient ischemic attack (TIA), and cerebral infarction without residual deficits: Secondary | ICD-10-CM | POA: Diagnosis not present

## 2020-10-15 DIAGNOSIS — Z905 Acquired absence of kidney: Secondary | ICD-10-CM | POA: Diagnosis not present

## 2020-10-15 DIAGNOSIS — N1831 Chronic kidney disease, stage 3a: Secondary | ICD-10-CM

## 2020-10-15 DIAGNOSIS — Z0000 Encounter for general adult medical examination without abnormal findings: Secondary | ICD-10-CM

## 2020-10-15 DIAGNOSIS — E782 Mixed hyperlipidemia: Secondary | ICD-10-CM

## 2020-10-15 MED ORDER — APIXABAN 5 MG PO TABS
5.0000 mg | ORAL_TABLET | Freq: Two times a day (BID) | ORAL | 1 refills | Status: DC
Start: 2020-10-15 — End: 2021-04-16

## 2020-10-15 MED ORDER — APIXABAN 5 MG PO TABS
5.0000 mg | ORAL_TABLET | Freq: Two times a day (BID) | ORAL | 3 refills | Status: DC
Start: 2020-10-15 — End: 2020-10-15

## 2020-10-15 MED ORDER — TRAZODONE HCL 50 MG PO TABS: 75.0000 mg | ORAL_TABLET | Freq: Every evening | ORAL | 3 refills | Status: DC | PRN

## 2020-10-15 NOTE — Patient Instructions (Addendum)
F/U 4  months  Eliquis sent to [facility] Turn in form to [facility] F/U 6 weeks for lab visit for thyroid

## 2020-10-15 NOTE — Progress Notes (Signed)
Subjective:   Patient presents for Medicare Annual/Subsequent preventive examination.   Since her last visit patient was admitted to the hospital for for stroke.  He was noted to have left frontal and left insular infarcts in the setting of his atrial fibrillation.  He was started on Eliquis and continued on aspirin but history of coronary artery disease and history of stent. Cardiology was also consulted as needed TTE showed a small indeterminate mobile echodensity but did not feel that TEE was needed at this time.  He has follow-up with cardiology on December 17 Neurology stroke follow-up- he has the number he is awaiting the f/u appointment   Hypertension his blood pressure was stable there is no change to his medications.  He is still on  Lipitor 40 mg once a day   Acute kidney injury his creatinine was 1.4 at discharge he does follow with nephrology history of right nephrectomy Dr. Jackquline Berlin was seen last week, told everything was stable   Chronic back pain with degenerative disc disease he is holding on surgical intervention.  He is using hydrocodone as needed he is also had spinal injections. Now followed by Dr. Newman Pies    Peripheral edema- taking lasix    Constipation- uses mirlaax and prune juice    Chronic insomnia- taking trazodone   Pre diabetse - A1C in hospital was 5.9%  Follows with Urology for elevated PSA , last visit in May 2021    Left pleural effusion noted on chest xray- states his breahting has been okay   Review Past Medical/Family/Social: Per EMR     Risk Factors  Current exercise habits: Tries to walk some Dietary issues discussed: No major concerns  Cardiac risk factors: CAD/Afib   Depression Screen  (Note: if answer to either of the following is "Yes", a more complete depression screening is indicated)  Over the past two weeks, have you felt down, depressed or hopeless? No Over the past two weeks, have you felt little interest  or pleasure in doing things? No Have you lost interest or pleasure in daily life? No Do you often feel hopeless? No Do you cry easily over simple problems? No   Activities of Daily Living  In your present state of health, do you have any difficulty performing the following activities?:  Driving? No  Managing money? No  Feeding yourself? No  Getting from bed to chair? No  Climbing a flight of stairs? No  Preparing food and eating?: No  Bathing or showering? No  Getting dressed: No  Getting to the toilet? No  Using the toilet:No  Moving around from place to place: Yes  In the past year have you fallen or had a near ?:Yes    Hearing Difficulties: No  Do you often ask people to speak up or repeat themselves? No  Do you experience ringing or noises in your ears? No Do you have difficulty understanding soft or whispered voices? No  Do you feel that you have a problem with memory? sometimes Do you often misplace items? No  Do you feel safe at home? Yes  Cognitive Testing  Alert? Yes Normal Appearance?Yes  Oriented to person? Yes Place? Yes  Time? Yes  Recall of three objects? Yes  Can perform simple calculations? Yes  Displays appropriate judgment?Yes  Can read the correct time from a watch face?Yes   List the Names of Other Physician/Practitioners you currently use: Urology- Dr. [name] Cardiology- DR. [facility]  Orthopedics Neurosurgery- Dr. Astrid Drafts  Screening Tests / Date Colonoscopyutd  Pneumonia uTD ZostavaxUTD COVID Vaccine UTD  Influenza VaccineUTD Tetanus/tdapUTD   ROS: GEN- denies fatigue, fever, weight loss,weakness, recent illness HEENT- denies eye drainage, change in vision, nasal discharge, CVS- denies chest pain, palpitations RESP- denies SOB, cough, wheeze ABD- denies N/V, change in stools, abd pain GU- denies dysuria, hematuria, dribbling, incontinence MSK-+joint pain, muscle aches, injury Neuro-  denies headache, dizziness, syncope, seizure activity  Physical: weight up 3lbs GEN- NAD, alert and oriented x3 HEENT- PERRL, EOMI, non injected sclera, pink conjunctiva, MMM, oropharynx clear, TM clear bilat no effusion Neck- Supple, no thryomegaly, no bruit CVS- RRR, no murmur RESP-CTAB ABD-NABS, soft,NT,ND  Psych- normal affect and mood NEURO- CNII-XII in tact, walks with cane EXT- trace ankle edema, varicose veins Pulses- Radial2+, DP-1+     Assessment:    Annual wellness medicare exam   Plan:    During the course of the visit the patient was educated and counseled about appropriate screening and preventive services including:   Audit C/ Depression screen negative    Immunizations up-to-date  H/o CVA - control BP, on anticoagulation , statin   CAD/A fib-plan for fasting labs.  Follow-up with cardiology.  Continue Lipitor , on Eliquis and tolerating   Back pain he is decided to hold off with neurosurgery at this time.  I have refilled his hydrocodone  Chronic insomnia continue trazodone  CKD- recheck renal function, history of right nephrectomy , continue f/u nephrology  Small Pleural effusion on CXR but no symptoms, normal respiratory exam,sat normal, no intervention at this time  DNR  Diet review for nutrition referral? Yes ____ Not Indicated __x__  Patient Instructions (the written plan) was given to the patient.  Medicare Attestation  I have personally reviewed:  The patient's medical and social history  Their use of alcohol, tobacco or illicit drugs  Their current medications and supplements  The patient's functional ability including ADLs,fall risks, home safety risks, cognitive, and hearing and visual impairment  Diet and physical activities  Evidence for depression or mood disorders  The patient's weight, height, BMI, and visual acuity have been recorded in the chart. I have made referrals, counseling, and provided education to the  patient based on review of the above and I have provided the patient with a written personalized care plan for preventive services.

## 2020-10-20 NOTE — Progress Notes (Signed)
Cardiology Office Note:    Date:  10/21/2020   ID:  [name] [name], DOB 05/06/1941, MRN [id number]  PCP:  Alycia Rossetti, MD  [facility] Cardiologist:  Sherren Mocha, MD  [facility] Electrophysiologist:  None   Referring MD: Alycia Rossetti, MD   Chief Complaint:  Hospitalization Follow-up (s/p CVA)    Patient Profile:    [name] [name] is a 79 y.o. 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   LHC 02/2007:  Proximal LAD 75%, mid LAD 99%, proximal RCA 25%.   PCI:  Cypher DES to the proximal and mid LAD.    Echocardiogram  01/2010:  Mild LVH, EF 55-60%, mild AI, mild MR, severely dilated LA and RA.   History of Present Illness:    Mr. [name] [name] was last seen in 10/2019 by Dr. Burt Knack.  He was admitted 00/8676 with an embolic CVA.  He was started on Apixaban.  An echocardiogram showed normal EF with a small mobile echodensity on the ventricular aspect of the AV posteriorly.  According to the notes, the case was d/w Dr. Johnsie Cancel and a f/u TEE was not felt to be needed.  He is seen for f/u.    He is here today with his wife.  He has been tolerating anticoagulation since discharge from the hospital.  He has not had chest discomfort.  He has chronic shortness of breath.  Overall, this is unchanged.  He has not had orthopnea, PND.  He has chronic leg edema.  This is overall well controlled with furosemide.  He  has not had syncope.  He has not had fever or cough.      Past Medical History:  Diagnosis Date  . Arthritis    shoulders and ribs   . Atrial fibrillation ([place])    atrial fib/ LOV [name] PA 04/11/12 EPIC, - CHEST X RAY, EKG 5/13 EPIC  . CAD (coronary artery disease)     s/p NSTEMI 04/08;  [place] 02/2007: Proximal LAD 75%, mid LAD 99%, proximal RCA 25%.  PCI:  Cypher DES to the proximal and mid LAD.  Last nuclear study 11/2011 (after a trip to the ED with CP): EF 58%, low risk study with small inferior wall infarct at the mid and basal level, no ischemia.  Last echo 01/2010: Mild LVH, EF 55-60%, mild AI, mild MR, severely dilated LA and RA  . Contrast dye induced nephropathy    Hx ARF secondary to contrast nephropathy  . Dyslipidemia   . Dysrhythmia   . Epididymitis   . History of blood transfusion   . Myocardial infarction ([name]) 2008  . Pleurisy    2012  . Pneumonia    hx of   . 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 Spinal stenosis   . Urothelial cancer ([place])    Dr. [name],  skin cancer non melanoma    Current Medications: Current Meds  Medication Sig  . acetaminophen (TYLENOL) 650 MG CR tablet Take 650 mg by mouth every 8 (eight) hours as needed for pain.  Marland Kitchen apixaban (ELIQUIS) 5 MG TABS tablet Take 1 tablet (5 mg total) by mouth 2 (two) times daily.  Marland Kitchen aspirin 81 MG chewable tablet Chew 81 mg by mouth daily.  Marland Kitchen atorvastatin (LIPITOR) 40 MG tablet TAKE 1 TABLET EVERY EVENING  . calcitRIOL (ROCALTROL) 0.5 MCG capsule Take 0.5 mcg by mouth daily.  . furosemide (LASIX) 40 MG tablet TAKE 1 TABLET EVERY DAY  . HYDROcodone-acetaminophen (NORCO) 5-325 MG tablet Take 1 tablet by mouth every 6 (six) hours as needed for moderate pain.  . niacin (NIASPAN) 500 MG CR tablet Take 1 tablet (500 mg total) by mouth at bedtime.  . nitroGLYCERIN (NITROSTAT) 0.4 MG SL tablet Place 1 tablet (0.4 mg total) under the tongue every 5 (five) minutes as needed for chest pain.  . Omega-3 Fatty Acids (FISH OIL) 1200 MG  CAPS Take 1,200 mg by mouth daily.   . polyethylene glycol (MIRALAX / GLYCOLAX) 17 g packet Take 17 g by mouth 2 (two) times daily.  . traZODone (DESYREL) 50 MG tablet Take 1.5 tablets (75 mg total) by mouth at bedtime as needed for sleep.  . vitamin B-12 (CYANOCOBALAMIN) 500 MCG tablet Take 500 mcg by mouth daily.  . [DISCONTINUED] nitroGLYCERIN (NITROSTAT) 0.4 MG SL tablet Place 1 tablet (0.4 mg total) under the tongue every 5 (five) minutes as needed for chest pain.     Allergies:   Contrast media [iodinated diagnostic agents], Iodine-131, Other, and Promethazine   Social History   Tobacco Use  . Smoking status: Former Smoker    Packs/day: 2.00    Years: 10.00    Pack years: 20.00    Types: Cigarettes, Cigars    Quit date: 11/15/1981    Years since quitting: 38.9  . Smokeless tobacco: Never Used  Vaping Use  . Vaping Use: Never used  Substance Use Topics  . Alcohol use: Not Currently    Comment: SOCIAL  . Drug use: No     Family Hx: The patient's family history includes Cancer in his mother; Coronary artery disease in his father; Diabetes type II in his mother; Heart attack in his father; Hyperlipidemia in his mother.  ROS   EKGs/Labs/Other Test Reviewed:    EKG:  EKG is  ordered today.  The ekg ordered today demonstrates atrial fibrillation, HR 63, normal axis, anteroseptal Q waves, QTC 417, no change from prior tracing  Recent Labs: 09/29/2020: ALT 13; TSH 5.196 10/01/2020: BUN 26; Creatinine, Ser 1.40; Hemoglobin 11.0; Platelets 201; Potassium 3.9; Sodium 139   Recent Lipid Panel Lab Results  Component Value Date/Time   CHOL 110 09/30/2020 03:34 AM   TRIG 54 09/30/2020 03:34 AM   HDL 30 (L) 09/30/2020 03:34 AM   CHOLHDL 3.7 09/30/2020 03:34 AM   LDLCALC 69 09/30/2020 03:34 AM   LDLCALC 76 04/11/2020 08:33 AM      Risk Assessment/Calculations:     CHA2DS2-VASc Score = 5  This indicates a 7.2% annual risk of stroke. The patient's score is based upon: CHF  History: 0 HTN History: 0 Diabetes History: 0 Stroke History: 2 Vascular Disease History: 1 Age Score: 2 Gender Score: 0     Physical Exam:    VS:  BP 138/78   Pulse 68   Ht 6' (1.829 m)   Wt 195  lb (88.5 kg)   SpO2 97%   BMI 26.45 kg/m     Wt Readings from Last 3 Encounters:  10/21/20 195 lb (88.5 kg)  10/15/20 191 lb (86.6 kg)  10/01/20 188 lb 0.8 oz (85.3 kg)     Constitutional:      Appearance: Healthy appearance. Not in distress.  Neck:     Thyroid: No thyromegaly.     Vascular: JVD normal.  Pulmonary:     Breath sounds: No wheezing. No rales.     Comments: Decreased breath sounds left base Cardiovascular:     Normal rate. Irregularly irregular rhythm. Normal S1. Normal S2.     Murmurs: There is no murmur.  Edema:    Pretibial: bilateral trace edema of the pretibial area. Abdominal:     Palpations: Abdomen is soft. There is no hepatomegaly.  Skin:    General: Skin is warm and dry.  Neurological:     General: No focal deficit present.     Mental Status: Alert and oriented to person, place and time.     Cranial Nerves: Cranial nerves are intact.       ASSESSMENT & PLAN:    1. Permanent atrial fibrillation (HCC) Overall rate controlled.  He was just admitted to the hospital in November with an embolic CVA.  He is now on Apixaban.  He will need to remain on this for stroke prevention.  He is currently tolerating anticoagulation without bleeding issues.  Of note, there was a small mobile echodensity on aortic valve on his echocardiogram in the hospital.  This was reviewed by one of our cardiologists and no further work-up was felt to be necessary.  I will follow up on this to make sure nothing else needs to be done.  Currently, his creatinine is less than 1.5 and his weight is greater than 60 kg.  He should remain on Apixaban 5 mg twice daily.  Arrange follow-up BMET, CBC in 6 weeks.  Follow-up with Dr. Burt Knack or me in 3 months.  2. Coronary artery disease  involving native coronary artery of native heart without angina pectoris History of Cypher DES to the LAD in 2008 in the setting of non-STEMI.  I will keep him on aspirin for now given history of first-generation stent.  He is currently not having anginal symptoms.  Continue atorvastatin, aspirin.  3. Mixed hyperlipidemia LDL optimal on most recent lab work.  Continue current Rx.    4. Stage 3a chronic kidney disease ([place]) Recent creatinine stable 1.4.  As noted, follow-up BMET in 6 weeks.  5. Abnormal echocardiogram I did review his echocardiogram with one of our non-invasive cardiologists.  This appears to be degenerative calcium on the aortic valve.  TEE is not needed.    6. Pleural effusion on left He notes an abnormal chest x-ray.  I reviewed his hospital records.  He has a left effusion.  He does not note significant shortness of breath, cough or fever.  I will obtain a repeat chest x-ray.  Further recommendations will be based upon the results of his repeat chest x-ray.    Dispo:  Return in about 3 months (around 01/19/2021) for Routine Follow Up, w/ Dr. Burt Knack, or Richardson Dopp, PA-C, in person.   Medication Adjustments/Labs and Tests Ordered: Current medicines are reviewed at length with the patient today.  Concerns regarding medicines are outlined above.  Tests Ordered: Orders Placed This Encounter  Procedures  . DG Chest 2 View  . Basic metabolic panel  .  CBC  . EKG 12-Lead   Medication Changes: Meds ordered this encounter  Medications  . nitroGLYCERIN (NITROSTAT) 0.4 MG SL tablet    Sig: Place 1 tablet (0.4 mg total) under the tongue every 5 (five) minutes as needed for chest pain.    Dispense:  25 tablet    Refill:  4    Signed, Richardson Dopp, PA-C  10/21/2020 4:42 PM    [facility] [address], [place], [place]  [address] Phone: [phone]; Fax: [phone]

## 2020-10-21 ENCOUNTER — Ambulatory Visit: Payer: Medicare HMO | Admitting: Physician Assistant

## 2020-10-21 ENCOUNTER — Other Ambulatory Visit: Payer: Self-pay

## 2020-10-21 ENCOUNTER — Encounter: Payer: Self-pay | Admitting: Physician Assistant

## 2020-10-21 VITALS — BP 138/78 | HR 68 | Ht 72.0 in | Wt 195.0 lb

## 2020-10-21 DIAGNOSIS — N1831 Chronic kidney disease, stage 3a: Secondary | ICD-10-CM | POA: Diagnosis not present

## 2020-10-21 DIAGNOSIS — E782 Mixed hyperlipidemia: Secondary | ICD-10-CM | POA: Diagnosis not present

## 2020-10-21 DIAGNOSIS — I2510 Atherosclerotic heart disease of native coronary artery without angina pectoris: Secondary | ICD-10-CM

## 2020-10-21 DIAGNOSIS — R931 Abnormal findings on diagnostic imaging of heart and coronary circulation: Secondary | ICD-10-CM

## 2020-10-21 DIAGNOSIS — J90 Pleural effusion, not elsewhere classified: Secondary | ICD-10-CM

## 2020-10-21 DIAGNOSIS — I4821 Permanent atrial fibrillation: Secondary | ICD-10-CM | POA: Diagnosis not present

## 2020-10-21 MED ORDER — NITROGLYCERIN 0.4 MG SL SUBL: 0.4000 mg | SUBLINGUAL_TABLET | SUBLINGUAL | 4 refills | Status: AC | PRN

## 2020-10-21 NOTE — Patient Instructions (Addendum)
Medication Instructions:  Your physician recommends that you continue on your current medications as directed. Please refer to the Current Medication list given to you today.  *If you need a refill on your cardiac medications before your next appointment, please call your pharmacy*  Lab Work: Your physician recommends that you return for lab work in 6 weeks on 12/02/2020 **The lab is open from 7:30AM-4:30PM, you may come anytime between these hours**  If you have labs (blood work) drawn today and your tests are completely normal, you will receive your results only by: Marland Kitchen MyChart Message (if you have MyChart) OR . A paper copy in the mail If you have any lab test that is abnormal or we need to change your treatment, we will call you to review the results.  Testing/Procedures: Chest X-ray Instructions:    1. You may have this done at the [facility], located in the [place] on the 1st floor.    2. You do no have to have an appointment.    3. [place], [place] [address]        [phone]        Monday - Friday  8:00 am - 5:00 pm  Follow-Up: On 01/21/2021 at 10:45AM with Richardson Dopp, PA-C  Other Instructions Check blood pressure 1-2 times a day for 2 weeks and send readings through my chart.

## 2020-10-22 ENCOUNTER — Other Ambulatory Visit: Payer: Self-pay | Admitting: Family Medicine

## 2020-10-22 DIAGNOSIS — R7989 Other specified abnormal findings of blood chemistry: Secondary | ICD-10-CM

## 2020-10-22 DIAGNOSIS — I2510 Atherosclerotic heart disease of native coronary artery without angina pectoris: Secondary | ICD-10-CM

## 2020-10-28 ENCOUNTER — Other Ambulatory Visit: Payer: Self-pay | Admitting: Family Medicine

## 2020-10-28 ENCOUNTER — Ambulatory Visit
Admission: RE | Admit: 2020-10-28 | Discharge: 2020-10-28 | Disposition: A | Discharge status: Home / Self Care | Payer: Medicare HMO | Source: Ambulatory Visit | Attending: Physician Assistant | Admitting: Physician Assistant

## 2020-10-28 DIAGNOSIS — Z955 Presence of coronary angioplasty implant and graft: Secondary | ICD-10-CM | POA: Diagnosis not present

## 2020-10-28 DIAGNOSIS — J90 Pleural effusion, not elsewhere classified: Secondary | ICD-10-CM | POA: Diagnosis not present

## 2020-10-28 DIAGNOSIS — S22080A Wedge compression fracture of T11-T12 vertebra, initial encounter for closed fracture: Secondary | ICD-10-CM | POA: Diagnosis not present

## 2020-10-31 ENCOUNTER — Other Ambulatory Visit: Payer: Self-pay

## 2020-10-31 DIAGNOSIS — J90 Pleural effusion, not elsewhere classified: Secondary | ICD-10-CM

## 2020-11-17 NOTE — Progress Notes (Signed)
[facility] [address] [place]. [place] [address] [phone]       HOSPITAL FOLLOW UP NOTE  Mr. [name] [name] Date of Birth:  02/06/1941 Medical Record Number:  [id number]   Reason for Referral:  hospital stroke follow up    SUBJECTIVE:   CHIEF COMPLAINT:  Chief Complaint  Patient presents with  . hospital f/u    STROKE    HPI:   [name] [name] a 80 y.o.malewith history of afib, CAD, HLD, right nephrectomypresented on 09/29/2020 for right side weakness and aphasia.  Personally reviewed hospitalization pertinent progress notes, lab work and imaging with summary provided.  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  TTE showed normal EF with small mobile echodensity on ventricular aspect of AV posteriorly and per notes, evaluated by Dr. Johnsie Cancel who felt TEE not needed at that time and advised outpatient follow-up with cardiology.  Per hospital notes, previously on Xarelto but not able to continue due to cost and recently declined DOAC and recent follow-up visit with cardiologist Dr. Burt Knack.  On aspirin PTA for hx of CAD s/p LAD stenting and recommend initiating Eliquis in addition for secondary stroke prevention. Hx of HTN stable with resuming home meds.  Hx of HLD on atorvastatin 40 mg daily with LDL 69.  Other stroke risk factors include advanced age, former tobacco use, EtOH use, hx of stroke by imaging and CAD s/p LAD stenting.  Evaluated by therapies and discharged home in stable condition without therapy needs.   Stroke:leftfrontal WM and left insularinfarctsembolic secondary to AF not in [facility]  CT no acute abnormality  MRIbrain left frontal WM and left insular cortex small infarcts. chronic left BG infarct  MRAhead and neck left M2 occlusion and another left M2 branch high grade stenosis. B/l P3/4 stenosis  2D EchoEF 55%.There is a small independent mobile echodensity on the  ventricular aspect of aortic valve.  LDL69  HgbA1c5.9  Heparin IVfor VTE prophylaxis  aspirin 81 mg dailyprior to admission, now on Eliquis. Continue ASA 81 due to CAD s/p LAD stenting  Patient counseled to be compliant withhisantithrombotic medications  Ongoing aggressive stroke risk factor management  Therapy recommendations:None  Disposition:Likely home tomorrow  Today, 11/18/2020, [name] [name] is being seen for hospital follow-up.  He has been doing well since discharge without residual deficits and denies new or reoccurring stroke/TIA symptoms.  Remains on aspirin and Eliquis without bleeding but does have mild bruising.  Remains on atorvastatin 40 mg daily without myalgias.  Blood pressure today 131/78.  Previously monitoring at home after discharge which was stable -not currently monitoring.  No concerns at this time.    ROS:   14 system review of systems performed and negative with exception of joint pain  PMH:  Past Medical History:  Diagnosis Date  . Arthritis    shoulders and ribs   . Atrial fibrillation ([place])    atrial fib/ LOV [name] PA 04/11/12 EPIC, - CHEST X RAY, EKG 5/13 EPIC  . CAD (coronary artery disease)     s/p NSTEMI 04/08;  [place] 02/2007: Proximal LAD 75%, mid LAD 99%, proximal RCA 25%.  PCI:  Cypher DES to the proximal and mid LAD.  Last nuclear study 11/2011 (after a trip to the ED with CP): EF 58%, low risk study with small inferior wall infarct at the mid and basal level, no ischemia.  Last echo 01/2010: Mild LVH, EF 55-60%, mild AI, mild  MR, severely dilated LA and RA  . Contrast dye induced nephropathy    Hx ARF secondary to contrast nephropathy  . Dyslipidemia   . Dysrhythmia   . Epididymitis   . History of blood transfusion   . Myocardial infarction (HCC) 2008  . Pleurisy    2012  . Pneumonia    hx of   . 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 Spinal stenosis   . 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 Laverle Patter,  skin cancer non melanoma    PSH:  Past  Surgical History:  Procedure Laterality Date  . arm surgery     orif right elbow  . CARDIAC CATHETERIZATION  2008  . coronary stents     . CYSTECTOMY  07/15/08  . CYSTOSCOPY WITH BIOPSY  03/23/2012   Procedure: CYSTOSCOPY WITH BIOPSY;  Surgeon: Crecencio Mc, MD;  Location: [facility];  Service: Urology;  Laterality: Right;   BRUSH BIOPSY RIGHT URETERAL STENT  . CYSTOSCOPY WITH URETEROSCOPY  03/23/2012   Procedure: CYSTOSCOPY WITH URETEROSCOPY;  Surgeon: Crecencio Mc, MD;  [facility]: [facility];  Service: Urology;  Laterality: Right;    **OR Room #8 requested**  C-ARM   . CYSTOSCOPY/RETROGRADE/URETEROSCOPY  02/15/2012   Procedure: CYSTOSCOPY/RETROGRADE/URETEROSCOPY;  Surgeon: Lindaann Slough, MD;  [facility]: [facility];  Service: Urology;  Laterality: Right;  C-ARM  . JOINT REPLACEMENT     Dr. Charlann Boxer 09-05-18   . left pinky finger      tip removed from accident  . right kidney removed      2013  . TOTAL KNEE ARTHROPLASTY Left 09/05/2018   Procedure: LEFT TOTAL KNEE ARTHROPLASTY;  Surgeon: Durene Romans, MD;  [facility]: [facility];  Service: Orthopedics;  Laterality: Left;  70 mins with abductor block  . TOTAL KNEE ARTHROPLASTY Right 04/03/2019   Procedure: RIGHT TOTAL KNEE ARTHROPLASTY;  Surgeon: Durene Romans, MD;  [facility];  Service: Orthopedics;  Laterality: Right;  70 mins  . URETER SURGERY      Social History:  Social History   Socioeconomic History  . Marital status: Married    Spouse name: Annice Pih  . Number of children: 3  . Years of education: 71  . Highest education level: Not on file  Occupational History  . Occupation: Retired  Tobacco Use  . Smoking status: Former Smoker    Packs/day: 2.00    Years: 10.00    Pack years: 20.00    Types: Cigarettes, Cigars    Quit date: 11/15/1981    Years since quitting: 39.0  . Smokeless tobacco: Never Used  Vaping Use  . Vaping Use: Never used  Substance and Sexual Activity  . Alcohol use: Not Currently    Comment: SOCIAL  . Drug use: No   . Sexual activity: Not Currently  Other Topics Concern  . Not on file  Social History Narrative   Lives in [place], [place] with wife. Exercising 3-4x/week.    Social Determinants of Health   Financial Resource Strain: Not on file  Food Insecurity: Not on file  Transportation Needs: Not on file  Physical Activity: Not on file  Stress: Not on file  Social Connections: Not on file  Intimate Partner Violence: Not on file    Family History:  Family History  Problem Relation Age of Onset  . Cancer Mother   . Diabetes type II Mother   . Hyperlipidemia Mother   . Coronary artery disease Father   . Heart attack Father  Medications:   Current Outpatient Medications on File Prior to Visit  Medication Sig Dispense Refill  . acetaminophen (TYLENOL) 650 MG CR tablet Take 650 mg by mouth every 8 (eight) hours as needed for pain.    Marland Kitchen apixaban (ELIQUIS) 5 MG TABS tablet Take 1 tablet (5 mg total) by mouth 2 (two) times daily. 180 tablet 1  . aspirin 81 MG chewable tablet Chew 81 mg by mouth daily.    Marland Kitchen atorvastatin (LIPITOR) 40 MG tablet TAKE 1 TABLET EVERY EVENING 90 tablet 3  . calcitRIOL (ROCALTROL) 0.5 MCG capsule Take 0.5 mcg by mouth daily.    . furosemide (LASIX) 40 MG tablet TAKE 1 TABLET EVERY DAY 90 tablet 3  . HYDROcodone-acetaminophen (NORCO) 5-325 MG tablet Take 1 tablet by mouth every 6 (six) hours as needed for moderate pain. 20 tablet 0  . niacin (NIASPAN) 500 MG CR tablet Take 1 tablet (500 mg total) by mouth at bedtime.    . nitroGLYCERIN (NITROSTAT) 0.4 MG SL tablet Place 1 tablet (0.4 mg total) under the tongue every 5 (five) minutes as needed for chest pain. 25 tablet 4  . Omega-3 Fatty Acids (FISH OIL) 1200 MG CAPS Take 1,200 mg by mouth daily.     . polyethylene glycol (MIRALAX / GLYCOLAX) 17 g packet Take 17 g by mouth 2 (two) times daily. 14 each 0  . traZODone (DESYREL) 50 MG tablet Take 1.5 tablets (75 mg total) by mouth at bedtime as needed for sleep. 135  tablet 3  . vitamin B-12 (CYANOCOBALAMIN) 500 MCG tablet Take 500 mcg by mouth daily.     No current facility-administered medications on file prior to visit.    Allergies:   Allergies  Allergen Reactions  . Contrast Media [Iodinated Diagnostic Agents]     Contrast-induced nephropathy requiring dialysis in 02/2007.  . Iodine-131 Other (See Comments)    Kidneys shut down  . Other Other (See Comments)    Beta blockers cause bradycardia  . Promethazine Other (See Comments)    Pt became drowsy and mild altered mental status      OBJECTIVE:  Physical Exam  Vitals:   11/18/20 0809  BP: 131/78  Pulse: 66  Weight: 189 lb 3.2 oz (85.8 kg)  Height: 6' (1.829 m)   Body mass index is 25.66 kg/m. No exam data present  Post stroke PHQ 2/9 Depression screen PHQ 2/9 11/18/2020  Decreased Interest 0  Down, Depressed, Hopeless 0  PHQ - 2 Score 0  Altered sleeping -  Tired, decreased energy -  Change in appetite -  Feeling bad or failure about yourself  -  Trouble concentrating -  Moving slowly or fidgety/restless -  Suicidal thoughts -  PHQ-9 Score -  Difficult doing work/chores -     General: well developed, well nourished,  pleasant elderly Caucasian male, seated, in no evident distress Head: head normocephalic and atraumatic.   Neck: supple with no carotid or supraclavicular bruits Cardiovascular: irregular rate and rhythm, no murmurs Musculoskeletal: no deformity Skin:  no rash/petichiae Vascular:  Normal pulses all extremities   Neurologic Exam Mental Status: Awake and fully alert.   Fluent speech and language.  Oriented to place and time. Recent and remote memory intact. Attention span, concentration and fund of knowledge appropriate. Mood and affect appropriate.  Cranial Nerves: Fundoscopic exam reveals sharp disc margins. Pupils equal, briskly reactive to light. Extraocular movements full without nystagmus. Visual fields full to confrontation.  HOH bilaterally.  Facial sensation intact. Face,  tongue, palate moves normally and symmetrically.  Motor: Normal bulk and tone. Normal strength in all tested extremity muscles Sensory.: intact to touch , pinprick , position and vibratory sensation.  Coordination: Rapid alternating movements normal in all extremities. Finger-to-nose and heel-to-shin performed accurately bilaterally. Gait and Station: Arises from chair without difficulty. Stance is normal. Gait demonstrates normal stride length and balance without use of assistive device. Reflexes: 1+ and symmetric. Toes downgoing.     NIHSS  0 Modified Rankin  0      ASSESSMENT: [name] [name] is a 80 y.o. 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 Vascular risk factors include permanent A. fib, HTN, HLD, advanced age, former tobacco use, hx of stroke by imaging and CAD s/p LAD stenting.      PLAN:  1. L frontal WM and insular infarct :  a. Recovered well without residual deficit.   b. Continue aspirin 81 mg daily and Eliquis (apixaban) daily for secondary stroke prevention, hx of CAD s/p LAD stenting and A. fib as well as continuation of atorvastatin 40 mg daily.   c. Discussed secondary stroke prevention measures and importance of close PCP follow up for aggressive stroke risk factor management  2. Persistent A. Fib: CHA2DS2-VASc score of at least 6.  Discussed importance of continuation of Eliquis for secondary stroke prevention with routine follow-up with cardiology for monitoring and management 3. HTN: BP goal <130/90.  Stable on furosemide per PCP/cardiology 4. HLD: LDL goal <70. Recent LDL 69 on atorvastatin 40 mg daily per PCP.     Follow up in 6 months or call earlier if needed   CC:  [facility] provider: Dr. Pamelia Hoit, [name], MD    I spent 45 minutes of face-to-face and non-face-to-face time  with patient.  This included previsit chart review including review of recent hospitalization pertinent progress notes, lab work and imaging, lab review, study review, order entry, electronic health record documentation, patient education regarding recent stroke and etiology, importance of managing stroke risk factors and answered all other questions to patient satisfaction   Frann Rider, [facility]  [facility] [address] [place], [place] [address]  Phone [phone] Fax [phone] Note: This document was prepared with digital dictation and possible smart phrase technology. Any transcriptional errors that result from this process are unintentional.

## 2020-11-18 ENCOUNTER — Ambulatory Visit: Payer: Medicare HMO | Admitting: Adult Health

## 2020-11-18 ENCOUNTER — Other Ambulatory Visit: Payer: Self-pay

## 2020-11-18 ENCOUNTER — Encounter: Payer: Self-pay | Admitting: Adult Health

## 2020-11-18 VITALS — BP 131/78 | HR 66 | Ht 72.0 in | Wt 189.2 lb

## 2020-11-18 DIAGNOSIS — I4821 Permanent atrial fibrillation: Secondary | ICD-10-CM | POA: Diagnosis not present

## 2020-11-18 DIAGNOSIS — I10 Essential (primary) hypertension: Secondary | ICD-10-CM | POA: Diagnosis not present

## 2020-11-18 DIAGNOSIS — I63412 Cerebral infarction due to embolism of left middle cerebral artery: Secondary | ICD-10-CM

## 2020-11-18 DIAGNOSIS — E785 Hyperlipidemia, unspecified: Secondary | ICD-10-CM | POA: Diagnosis not present

## 2020-11-18 NOTE — Patient Instructions (Signed)
Continue aspirin 81 mg daily and Eliquis (apixaban) daily  and atorvastatin  for secondary stroke prevention  Continue to follow with cardiology for atrial fibrillation and eliquis management   Continue to follow up with PCP regarding cholesterol and blood pressure management  Maintain strict control of hypertension with blood pressure goal below 130/90 and cholesterol with LDL cholesterol (bad cholesterol) goal below 70 mg/dL.      Followup in the future with me in 6 months or call earlier if needed      Thank you for coming to see [place] at [facility]. I hope we have been able to provide you high quality care today.  You may receive a patient satisfaction survey over the next few weeks. We would appreciate your feedback and comments so that we may continue to improve ourselves and the health of our patients.

## 2020-11-18 NOTE — Progress Notes (Signed)
I agree with the above plan 

## 2020-11-20 DIAGNOSIS — H5310 Unspecified subjective visual disturbances: Secondary | ICD-10-CM | POA: Diagnosis not present

## 2020-11-20 DIAGNOSIS — H04123 Dry eye syndrome of bilateral lacrimal glands: Secondary | ICD-10-CM | POA: Diagnosis not present

## 2020-11-26 ENCOUNTER — Other Ambulatory Visit: Payer: Medicare HMO

## 2020-12-01 ENCOUNTER — Other Ambulatory Visit: Payer: Medicare HMO

## 2020-12-02 ENCOUNTER — Other Ambulatory Visit: Payer: Medicare HMO

## 2020-12-03 ENCOUNTER — Other Ambulatory Visit: Payer: Medicare HMO

## 2020-12-03 ENCOUNTER — Ambulatory Visit
Admission: RE | Admit: 2020-12-03 | Discharge: 2020-12-03 | Disposition: A | Discharge status: Home / Self Care | Payer: Medicare HMO | Source: Ambulatory Visit | Attending: Physician Assistant | Admitting: Physician Assistant

## 2020-12-03 ENCOUNTER — Other Ambulatory Visit: Payer: Self-pay

## 2020-12-03 DIAGNOSIS — I2510 Atherosclerotic heart disease of native coronary artery without angina pectoris: Secondary | ICD-10-CM

## 2020-12-03 DIAGNOSIS — R918 Other nonspecific abnormal finding of lung field: Secondary | ICD-10-CM | POA: Diagnosis not present

## 2020-12-03 DIAGNOSIS — R7989 Other specified abnormal findings of blood chemistry: Secondary | ICD-10-CM

## 2020-12-03 DIAGNOSIS — R946 Abnormal results of thyroid function studies: Secondary | ICD-10-CM | POA: Diagnosis not present

## 2020-12-03 DIAGNOSIS — J90 Pleural effusion, not elsewhere classified: Secondary | ICD-10-CM | POA: Diagnosis not present

## 2020-12-05 ENCOUNTER — Other Ambulatory Visit: Payer: Self-pay

## 2020-12-05 ENCOUNTER — Telehealth: Payer: Self-pay | Admitting: Family Medicine

## 2020-12-05 ENCOUNTER — Telehealth: Payer: Self-pay

## 2020-12-05 DIAGNOSIS — J90 Pleural effusion, not elsewhere classified: Secondary | ICD-10-CM

## 2020-12-05 NOTE — Telephone Encounter (Signed)
  would like to speak with the nurse concerning his lab result should he reach out to his Kidney dr ?

## 2020-12-05 NOTE — Telephone Encounter (Signed)
-----   Message from Liliane Shi, [place] sent at 12/05/2020 12:05 PM EST ----- CXR with persistent mod L pleural effusion.  I reviewed with Dr. Burt Knack.   PLAN:   - Please refer to pulmonology for evaluation of persistent L pleural effusion.  Richardson Dopp, PA-C    12/05/2020 12:03 PM

## 2020-12-05 NOTE — Telephone Encounter (Signed)
The patient has been notified of the result and verbalized understanding.  All questions (if any) were answered. Patient was not very happy about being referred to a pulmonologist. He would like to know if it is absolutely necessary. He wants to know what would happen if he didn't see a pulmonologist. Patient aware that I will call him back Monday or Tuesday.  Mady Haagensen, [facility] 12/05/2020 3:49 PM

## 2020-12-05 NOTE — Telephone Encounter (Signed)
Spoke with family, labs improved, including renal function  FT3//FT4 still pending we will let them know when it comes in

## 2020-12-06 LAB — BASIC METABOLIC PANEL
BUN/Creatinine Ratio: 14 (calc) (ref 6–22)
BUN: 19 mg/dL (ref 7–25)
CO2: 25 mmol/L (ref 20–32)
Calcium: 8.9 mg/dL (ref 8.6–10.3)
Chloride: 105 mmol/L (ref 98–110)
Creat: 1.32 mg/dL — ABNORMAL HIGH (ref 0.70–1.11)
Glucose, Bld: 92 mg/dL (ref 65–99)
Potassium: 4.5 mmol/L (ref 3.5–5.3)
Sodium: 141 mmol/L (ref 135–146)

## 2020-12-06 LAB — CBC WITH DIFFERENTIAL/PLATELET
Absolute Monocytes: 596 cells/uL (ref 200–950)
Basophils Absolute: 59 cells/uL (ref 0–200)
Basophils Relative: 1 %
Eosinophils Absolute: 171 cells/uL (ref 15–500)
Eosinophils Relative: 2.9 %
HCT: 37.7 % — ABNORMAL LOW (ref 38.5–50.0)
Hemoglobin: 12.3 g/dL — ABNORMAL LOW (ref 13.2–17.1)
Lymphs Abs: 985 cells/uL (ref 850–3900)
MCH: 29.6 pg (ref 27.0–33.0)
MCHC: 32.6 g/dL (ref 32.0–36.0)
MCV: 90.6 fL (ref 80.0–100.0)
MPV: 10.2 fL (ref 7.5–12.5)
Monocytes Relative: 10.1 %
Neutro Abs: 4089 cells/uL (ref 1500–7800)
Neutrophils Relative %: 69.3 %
Platelets: 214 10*3/uL (ref 140–400)
RBC: 4.16 10*6/uL — ABNORMAL LOW (ref 4.20–5.80)
RDW: 13.5 % (ref 11.0–15.0)
Total Lymphocyte: 16.7 %
WBC: 5.9 10*3/uL (ref 3.8–10.8)

## 2020-12-06 LAB — TEST AUTHORIZATION

## 2020-12-06 LAB — T4, FREE: Free T4: 1.1 ng/dL (ref 0.8–1.8)

## 2020-12-06 LAB — TSH: TSH: 5.13 mIU/L — ABNORMAL HIGH (ref 0.40–4.50)

## 2020-12-06 LAB — T3, FREE: T3, Free: 2.9 pg/mL (ref 2.3–4.2)

## 2020-12-08 NOTE — Telephone Encounter (Signed)
I called the patient at home today.  We reviewed the findings on the CXR and the rationale for a referral to pulmonology. He is agreeable. PLAN: 1. Please refer to pulmonology for persistent L pleural effusion. Richardson Dopp, PA-C    12/08/2020 11:49 AM

## 2020-12-09 ENCOUNTER — Encounter: Payer: Self-pay | Admitting: Family Medicine

## 2020-12-09 ENCOUNTER — Other Ambulatory Visit: Payer: Self-pay

## 2020-12-09 ENCOUNTER — Ambulatory Visit (INDEPENDENT_AMBULATORY_CARE_PROVIDER_SITE_OTHER): Payer: Medicare HMO | Admitting: Family Medicine

## 2020-12-09 VITALS — BP 144/68 | HR 78 | Temp 98.3°F | Ht 72.0 in | Wt 191.0 lb

## 2020-12-09 DIAGNOSIS — J029 Acute pharyngitis, unspecified: Secondary | ICD-10-CM

## 2020-12-09 NOTE — Telephone Encounter (Signed)
Referral to pulmonology started

## 2020-12-09 NOTE — Progress Notes (Signed)
Subjective:    Patient ID: [name] [name], male    DOB: 1941/09/14, 80 y.o.   MRN: [id number]  HPI  Patient is fully vaccinated against Covid.  He states that over the last 4 to 5 days he has had a sore scratchy throat.  He states that whenever he swallows it feels like there is something hanging in the back of his throat.  He denies any fever.  He denies any chills.  He denies any dysphagia.  There is no lymphadenopathy in the neck.  He denies any sinus pain or cough or rhinorrhea or head congestion.  On examination today there is no erythema or exudate in the posterior oropharynx of note palpable lymphadenopathy. Past Medical History:  Diagnosis Date  . Arthritis    shoulders and ribs   . Atrial fibrillation ([place])    atrial fib/ LOV [name] PA 04/11/12 EPIC, - CHEST X RAY, EKG 5/13 EPIC  . CAD (coronary artery disease)     s/p NSTEMI 04/08;  [place] 02/2007: Proximal LAD 75%, mid LAD 99%, proximal RCA 25%.  PCI:  Cypher DES to the proximal and mid LAD.  Last nuclear study 11/2011 (after a trip to the ED with CP): EF 58%, low risk study with small inferior wall infarct at the mid and basal level, no ischemia.  Last echo 01/2010: Mild LVH, EF 55-60%, mild AI, mild MR, severely dilated LA and RA  . Contrast dye induced nephropathy    Hx ARF secondary to contrast nephropathy  . Dyslipidemia   . Dysrhythmia   . Epididymitis   . History of blood transfusion   . Myocardial infarction ([place]) 2008  . Pleurisy    2012  . Pneumonia    hx of   . 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 Spinal stenosis   . 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 [name],  skin cancer non melanoma   Past Surgical History:  Procedure Laterality Date  . arm surgery     orif right elbow  . CARDIAC CATHETERIZATION  2008  . coronary stents     . CYSTECTOMY  07/15/08  . CYSTOSCOPY WITH BIOPSY  03/23/2012   Procedure: CYSTOSCOPY WITH BIOPSY;  Surgeon: [name], MD;  [facility];  Service: Urology;  Laterality: Right;   BRUSH  BIOPSY RIGHT URETERAL STENT  . CYSTOSCOPY WITH URETEROSCOPY  03/23/2012   Procedure: CYSTOSCOPY WITH URETEROSCOPY;  Surgeon: [name], MD;  [facility];  Service: Urology;  Laterality: Right;    **OR Room #8 requested**  C-ARM   . CYSTOSCOPY/RETROGRADE/URETEROSCOPY  02/15/2012   Procedure: CYSTOSCOPY/RETROGRADE/URETEROSCOPY;  Surgeon: [name], MD;  [facility];  Service: Urology;  Laterality: Right;  C-ARM  . JOINT REPLACEMENT     Dr. [name] 09-05-18   . left pinky finger      tip removed from accident  . right kidney removed      2013  . TOTAL KNEE ARTHROPLASTY Left 09/05/2018   Procedure: LEFT TOTAL KNEE ARTHROPLASTY;  Surgeon: [name], MD;  [facility];  Service: Orthopedics;  Laterality: Left;  70 mins with abductor block  . TOTAL KNEE ARTHROPLASTY Right 04/03/2019   Procedure: RIGHT TOTAL KNEE ARTHROPLASTY;  Surgeon: [name], MD;  [facility];  Service: Orthopedics;  Laterality: Right;  70 mins  . URETER SURGERY     Current Outpatient Medications on File Prior to Visit  Medication Sig Dispense Refill  . acetaminophen (TYLENOL) 650 MG  CR tablet Take 650 mg by mouth every 8 (eight) hours as needed for pain.    Marland Kitchen apixaban (ELIQUIS) 5 MG TABS tablet Take 1 tablet (5 mg total) by mouth 2 (two) times daily. 180 tablet 1  . aspirin 81 MG chewable tablet Chew 81 mg by mouth daily.    Marland Kitchen atorvastatin (LIPITOR) 40 MG tablet TAKE 1 TABLET EVERY EVENING 90 tablet 3  . calcitRIOL (ROCALTROL) 0.5 MCG capsule Take 0.5 mcg by mouth daily.    . furosemide (LASIX) 40 MG tablet TAKE 1 TABLET EVERY DAY 90 tablet 3  . HYDROcodone-acetaminophen (NORCO) 5-325 MG tablet Take 1 tablet by mouth every 6 (six) hours as needed for moderate pain. 20 tablet 0  . niacin (NIASPAN) 500 MG CR tablet Take 1 tablet (500 mg total) by mouth at bedtime.    . nitroGLYCERIN (NITROSTAT) 0.4 MG SL tablet Place 1 tablet (0.4 mg total) under the tongue every 5 (five) minutes as needed for  chest pain. 25 tablet 4  . Omega-3 Fatty Acids (FISH OIL) 1200 MG CAPS Take 1,200 mg by mouth daily.     . polyethylene glycol (MIRALAX / GLYCOLAX) 17 g packet Take 17 g by mouth 2 (two) times daily. 14 each 0  . traZODone (DESYREL) 50 MG tablet Take 1.5 tablets (75 mg total) by mouth at bedtime as needed for sleep. 135 tablet 3  . vitamin B-12 (CYANOCOBALAMIN) 500 MCG tablet Take 500 mcg by mouth daily.     No current facility-administered medications on file prior to visit.   Allergies  Allergen Reactions  . Contrast Media [Iodinated Diagnostic Agents]     Contrast-induced nephropathy requiring dialysis in 02/2007.  . Iodine-131 Other (See Comments)    Kidneys shut down  . Other Other (See Comments)    Beta blockers cause bradycardia  . Promethazine Other (See Comments)    Pt became drowsy and mild altered mental status   Social History   Socioeconomic History  . Marital status: Married    Spouse name: [name]  . Number of children: 3  . Years of education: 3  . Highest education level: Not on file  Occupational History  . Occupation: Retired  Tobacco Use  . Smoking status: Former Smoker    Packs/day: 2.00    Years: 10.00    Pack years: 20.00    Types: Cigarettes, Cigars    Quit date: 11/15/1981    Years since quitting: 39.0  . Smokeless tobacco: Never Used  Vaping Use  . Vaping Use: Never used  Substance and Sexual Activity  . Alcohol use: Not Currently    Comment: SOCIAL  . Drug use: No  . Sexual activity: Not Currently  Other Topics Concern  . Not on file  Social History Narrative   Lives in [place], [place] with wife. Exercising 3-4x/week.    Social Determinants of Health   Financial Resource Strain: Not on file  Food Insecurity: Not on file  Transportation Needs: Not on file  Physical Activity: Not on file  Stress: Not on file  Social Connections: Not on file  Intimate Partner Violence: Not on file     Review of Systems  All other systems reviewed  and are negative.      Objective:   Physical Exam Vitals reviewed.  Constitutional:      General: He is not in acute distress.    Appearance: He is well-developed. He is not ill-appearing, toxic-appearing or diaphoretic.  HENT:  Right Ear: Tympanic membrane and ear canal normal.     Left Ear: Tympanic membrane and ear canal normal.     Nose: No congestion or rhinorrhea.     Mouth/Throat:     Pharynx: No pharyngeal swelling, oropharyngeal exudate or posterior oropharyngeal erythema.  Cardiovascular:     Rate and Rhythm: Normal rate. Rhythm irregular.     Heart sounds: No murmur heard. No friction rub.  Pulmonary:     Effort: Pulmonary effort is normal. No respiratory distress.     Breath sounds: Normal breath sounds. No stridor. No wheezing, rhonchi or rales.  Neurological:     Mental Status: He is alert.           Assessment & Plan:  Pharyngitis, unspecified etiology - Plan: SARS-COV-2 RNA,(COVID-19) QUAL NAAT  I suspect viral pharyngitis.  Recommended Dukes Magic mouthwash gargle and swallow every 4 hours as needed for sore throat.  Screen the patient for Covid.  Anticipate 3 to 4 days for symptom resolution.  Please recheck if no better in 3 to 4 days or sooner if worsening or symptoms change.

## 2020-12-11 LAB — SARS-COV-2 RNA,(COVID-19) QUALITATIVE NAAT: SARS CoV2 RNA: NOT DETECTED

## 2020-12-23 ENCOUNTER — Other Ambulatory Visit: Payer: Self-pay

## 2020-12-23 ENCOUNTER — Ambulatory Visit: Payer: Medicare HMO | Admitting: Internal Medicine

## 2020-12-23 ENCOUNTER — Encounter: Payer: Self-pay | Admitting: Internal Medicine

## 2020-12-23 VITALS — BP 140/70 | HR 79 | Temp 98.0°F | Ht 72.0 in | Wt 189.6 lb

## 2020-12-23 DIAGNOSIS — J90 Pleural effusion, not elsewhere classified: Secondary | ICD-10-CM

## 2020-12-23 NOTE — Addendum Note (Signed)
Addended by: Vanessa Barbara on: 12/23/2020 03:06 PM   Modules accepted: Orders

## 2020-12-23 NOTE — Addendum Note (Signed)
Addended by: Vanessa Barbara on: 12/23/2020 02:57 PM   Modules accepted: Orders
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
## 2020-12-23 NOTE — Patient Instructions (Addendum)
ICD-10-CM   1. Pleural effusion, left  J90     Cause is varied. Can be related to rib fracture, or potential infections or cancer or unknown reasons  Plan   The fluid removal will be done by Interventional radiology   - eliquis might have to be held. Radiology to decide this  - they will remove not more than 1.5L  - fluid labs to be sent: cell count, gram stain and culture, cytology for malignant cells, chemistries for LDH, albumin,  Protein, glucose, triglyceride and lipase    - blood labs to be sent on same day ideally if not today: cbc, ldh, protein, albumin glucose, and lipase  - Do blood test for quantiferon gold   Then withion 48-72h of thoracentesis do HRCT supine and prone without contrast  Talk t PCP about hoarse voice  Followup  - 3-4 weeks from now but > 1 week after thoracentesis with APP or Dr Chase Caller to review results

## 2020-12-24 ENCOUNTER — Other Ambulatory Visit: Payer: Self-pay | Admitting: Internal Medicine

## 2020-12-24 ENCOUNTER — Ambulatory Visit (HOSPITAL_COMMUNITY)
Admission: RE | Admit: 2020-12-24 | Discharge: 2020-12-24 | Disposition: A | Discharge status: Home / Self Care | Payer: Medicare HMO | Source: Ambulatory Visit | Attending: Internal Medicine | Admitting: Internal Medicine

## 2020-12-24 DIAGNOSIS — Z20822 Contact with and (suspected) exposure to covid-19: Secondary | ICD-10-CM | POA: Insufficient documentation

## 2020-12-24 DIAGNOSIS — Z01812 Encounter for preprocedural laboratory examination: Secondary | ICD-10-CM | POA: Diagnosis not present

## 2020-12-24 DIAGNOSIS — J90 Pleural effusion, not elsewhere classified: Secondary | ICD-10-CM | POA: Diagnosis not present

## 2020-12-24 LAB — SARS CORONAVIRUS 2 (TAT 6-24 HRS): SARS Coronavirus 2: NEGATIVE

## 2020-12-26 ENCOUNTER — Other Ambulatory Visit: Payer: Self-pay | Admitting: Internal Medicine

## 2020-12-26 ENCOUNTER — Ambulatory Visit (HOSPITAL_COMMUNITY)
Admission: RE | Admit: 2020-12-26 | Discharge: 2020-12-26 | Disposition: A | Discharge status: Home / Self Care | Payer: Medicare HMO | Source: Ambulatory Visit | Attending: Internal Medicine | Admitting: Internal Medicine

## 2020-12-26 ENCOUNTER — Other Ambulatory Visit: Payer: Self-pay

## 2020-12-26 DIAGNOSIS — J948 Other specified pleural conditions: Secondary | ICD-10-CM | POA: Diagnosis not present

## 2020-12-26 DIAGNOSIS — J90 Pleural effusion, not elsewhere classified: Secondary | ICD-10-CM | POA: Diagnosis not present

## 2020-12-26 HISTORY — PX: IR THORACENTESIS ASP PLEURAL SPACE W/IMG GUIDE: IMG5380

## 2020-12-26 LAB — CBC WITH DIFFERENTIAL/PLATELET
Basophils Absolute: 0 10*3/uL (ref 0.0–0.2)
Basos: 1 %
EOS (ABSOLUTE): 0.2 10*3/uL (ref 0.0–0.4)
Eos: 2 %
Hematocrit: 39.4 % (ref 37.5–51.0)
Hemoglobin: 12.7 g/dL — ABNORMAL LOW (ref 13.0–17.7)
Immature Grans (Abs): 0 10*3/uL (ref 0.0–0.1)
Immature Granulocytes: 1 %
Lymphocytes Absolute: 0.8 10*3/uL (ref 0.7–3.1)
Lymphs: 11 %
MCH: 29 pg (ref 26.6–33.0)
MCHC: 32.2 g/dL (ref 31.5–35.7)
MCV: 90 fL (ref 79–97)
Monocytes Absolute: 0.7 10*3/uL (ref 0.1–0.9)
Monocytes: 10 %
Neutrophils Absolute: 5.2 10*3/uL (ref 1.4–7.0)
Neutrophils: 75 %
Platelets: 252 10*3/uL (ref 150–450)
RBC: 4.38 x10E6/uL (ref 4.14–5.80)
RDW: 13.8 % (ref 11.6–15.4)
WBC: 6.8 10*3/uL (ref 3.4–10.8)

## 2020-12-26 LAB — QUANTIFERON-TB GOLD PLUS
QuantiFERON Mitogen Value: 0.66 IU/mL
QuantiFERON Nil Value: 0.02 IU/mL
QuantiFERON TB1 Ag Value: 0.02 IU/mL
QuantiFERON TB2 Ag Value: 0.02 IU/mL
QuantiFERON-TB Gold Plus: NEGATIVE

## 2020-12-26 LAB — GLUCOSE, PLEURAL OR PERITONEAL FLUID: Glucose, Fluid: 76 mg/dL

## 2020-12-26 LAB — BODY FLUID CELL COUNT WITH DIFFERENTIAL: Total Nucleated Cell Count, Fluid: 1620 cu mm — ABNORMAL HIGH (ref 0–1000)

## 2020-12-26 LAB — PATHOLOGIST SMEAR REVIEW: Path Review: INCREASED

## 2020-12-26 LAB — GRAM STAIN

## 2020-12-26 LAB — ALBUMIN, PLEURAL OR PERITONEAL FLUID: Albumin, Fluid: 1.4 g/dL

## 2020-12-26 LAB — LACTATE DEHYDROGENASE, PLEURAL OR PERITONEAL FLUID: LD, Fluid: 128 U/L — ABNORMAL HIGH (ref 3–23)

## 2020-12-26 LAB — ALBUMIN
Albumin/Globulin Ratio: 1.4 (ref 1.2–2.2)
Albumin: 4 g/dL (ref 3.6–4.6)
Globulin, Total: 2.8 g/dL (ref 1.5–4.5)

## 2020-12-26 LAB — AMYLASE, PLEURAL OR PERITONEAL FLUID: Amylase, Fluid: 25 U/L

## 2020-12-26 LAB — LACTATE DEHYDROGENASE: LDH: 198 IU/L (ref 121–224)

## 2020-12-26 LAB — PROTEIN, PLEURAL OR PERITONEAL FLUID: Total protein, fluid: <3 g/dL

## 2020-12-26 LAB — LIPASE: Lipase: 44 U/L (ref 13–78)

## 2020-12-26 LAB — GLUCOSE, RANDOM: Glucose: 98 mg/dL (ref 65–99)

## 2020-12-26 LAB — PROTEIN, TOTAL: Total Protein: 6.8 g/dL (ref 6.0–8.5)

## 2020-12-26 MED ORDER — LIDOCAINE HCL 1 % IJ SOLN
INTRAMUSCULAR | Status: AC
  Filled 2020-12-26: qty 20

## 2020-12-26 MED ORDER — LIDOCAINE HCL (PF) 1 % IJ SOLN
INTRAMUSCULAR | Status: AC | PRN
  Administered 2020-12-26: 10 mL

## 2020-12-26 NOTE — Procedures (Signed)
PROCEDURE SUMMARY:  Successful US guided left thoracentesis. Yielded 500 ml of amber-colored fluid. Pt tolerated procedure well. No immediate complications.  Specimen sent for labs. CXR ordered; no post-procedure pneumothorax  EBL < 2 mL  Theresa Duty, NP 12/26/2020 11:44 AM

## 2020-12-27 LAB — TRIGLYCERIDES, BODY FLUIDS: Triglycerides, Fluid: 12 mg/dL

## 2020-12-29 ENCOUNTER — Ambulatory Visit (HOSPITAL_BASED_OUTPATIENT_CLINIC_OR_DEPARTMENT_OTHER)
Admission: RE | Admit: 2020-12-29 | Discharge: 2020-12-29 | Disposition: A | Discharge status: Home / Self Care | Payer: Medicare HMO | Source: Ambulatory Visit | Attending: Internal Medicine | Admitting: Internal Medicine

## 2020-12-29 ENCOUNTER — Other Ambulatory Visit: Payer: Self-pay

## 2020-12-29 ENCOUNTER — Telehealth: Payer: Self-pay | Admitting: Internal Medicine

## 2020-12-29 DIAGNOSIS — J90 Pleural effusion, not elsewhere classified: Secondary | ICD-10-CM | POA: Diagnosis not present

## 2020-12-29 DIAGNOSIS — K8020 Calculus of gallbladder without cholecystitis without obstruction: Secondary | ICD-10-CM | POA: Diagnosis not present

## 2020-12-29 DIAGNOSIS — I2510 Atherosclerotic heart disease of native coronary artery without angina pectoris: Secondary | ICD-10-CM | POA: Insufficient documentation

## 2020-12-29 DIAGNOSIS — I700 Atherosclerosis of aorta: Secondary | ICD-10-CM | POA: Diagnosis not present

## 2020-12-29 LAB — PH, BODY FLUID: pH, Body Fluid: 7.4

## 2020-12-29 NOTE — Telephone Encounter (Signed)
Dr. Chase Caller please advise on patient's lab work 12/26/20 as he is calling for results.

## 2020-12-30 LAB — CYTOLOGY - NON PAP

## 2020-12-30 NOTE — Telephone Encounter (Signed)
Called and spoke with pt and wife letting pt know the info stated by MR in regards to results of CT. Stated to them that MR wants pt to come in for a sooner visit than scheduled appt of 02/04/21 so he can discuss the results of CT in detail. Pt has been scheduled for an appt with MR 2/17 at 11:45. Nothing further needed.

## 2020-12-30 NOTE — Telephone Encounter (Signed)
MR, you do not have any openings on your schedule from now until pt's current scheduled appt with you on 02/04/21. This has to be okayed by Ander Purpura or Patrice for pt to be added on your schedule.  Routing to [employer] to see if this pt can be added on. Please advise.

## 2020-12-30 NOTE — Telephone Encounter (Signed)
The resutls are not showing active infection or cancer cells though these are not ruled out. IT is called non-diagnostic. On the CT there is thickening . This is kind of hard to explain via triage desk. Probably serial monitoring best option  Plan - pls move his appt from 02/04/21 to this week or next week as even tele visit or face to face to go ove results - 15 min fine

## 2020-12-30 NOTE — Telephone Encounter (Signed)
LMTCB

## 2020-12-30 NOTE — Telephone Encounter (Signed)
Pt calling back [phone]

## 2020-12-30 NOTE — Telephone Encounter (Signed)
Patient can be added as a televisit any day  Looks like only availability this week is Thursday at 1145 tell patient to come early so rooming can be done by time of appointment.

## 2020-12-31 LAB — CULTURE, BODY FLUID W GRAM STAIN -BOTTLE: Culture: NO GROWTH

## 2020-12-31 LAB — CHOLESTEROL, BODY FLUID: Cholesterol, Fluid: 32 mg/dL

## 2021-01-01 ENCOUNTER — Other Ambulatory Visit: Payer: Self-pay

## 2021-01-01 ENCOUNTER — Encounter: Payer: Self-pay | Admitting: Internal Medicine

## 2021-01-01 ENCOUNTER — Ambulatory Visit (INDEPENDENT_AMBULATORY_CARE_PROVIDER_SITE_OTHER): Payer: Medicare HMO | Admitting: Internal Medicine

## 2021-01-01 VITALS — BP 112/64 | HR 72 | Temp 98.0°F | Ht 72.0 in | Wt 184.4 lb

## 2021-01-01 DIAGNOSIS — J90 Pleural effusion, not elsewhere classified: Secondary | ICD-10-CM | POA: Diagnosis not present

## 2021-01-01 NOTE — Patient Instructions (Addendum)
ICD-10-CM   1. Pleural effusion, left  J90    Pleural effusion based on thoracentesis 12/26/2020 was nondiagnostic Post effusion CT scan of the chest still shows some residual effusion but no obvious mass Glad you are feeling better in retrospect after thoracentesis   Plan -Serial observation is the best approach  Follow-up -Do chest x-ray in early to mid April 2022 -Return to see Dr. Chase Caller or nurse practitioner in early to mid April 2022 [cancel the February 04, 2021 appointment] -Return sooner if needed
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
## 2021-01-07 ENCOUNTER — Other Ambulatory Visit: Payer: Self-pay

## 2021-01-07 ENCOUNTER — Ambulatory Visit (INDEPENDENT_AMBULATORY_CARE_PROVIDER_SITE_OTHER): Payer: Medicare HMO | Admitting: Internal Medicine

## 2021-01-07 ENCOUNTER — Encounter: Payer: Self-pay | Admitting: Internal Medicine

## 2021-01-07 VITALS — BP 148/80 | HR 70 | Temp 98.3°F | Resp 18 | Ht 72.0 in | Wt 187.4 lb

## 2021-01-07 DIAGNOSIS — I10 Essential (primary) hypertension: Secondary | ICD-10-CM

## 2021-01-07 DIAGNOSIS — N1831 Chronic kidney disease, stage 3a: Secondary | ICD-10-CM

## 2021-01-07 DIAGNOSIS — J029 Acute pharyngitis, unspecified: Secondary | ICD-10-CM | POA: Diagnosis not present

## 2021-01-07 DIAGNOSIS — G4700 Insomnia, unspecified: Secondary | ICD-10-CM | POA: Insufficient documentation

## 2021-01-07 DIAGNOSIS — Z905 Acquired absence of kidney: Secondary | ICD-10-CM

## 2021-01-07 DIAGNOSIS — Z7689 Persons encountering health services in other specified circumstances: Secondary | ICD-10-CM | POA: Diagnosis not present

## 2021-01-07 DIAGNOSIS — I4821 Permanent atrial fibrillation: Secondary | ICD-10-CM

## 2021-01-07 DIAGNOSIS — I2510 Atherosclerotic heart disease of native coronary artery without angina pectoris: Secondary | ICD-10-CM

## 2021-01-07 DIAGNOSIS — Z8673 Personal history of transient ischemic attack (TIA), and cerebral infarction without residual deficits: Secondary | ICD-10-CM | POA: Diagnosis not present

## 2021-01-07 NOTE — Assessment & Plan Note (Signed)
On Trazodone 50 mg qHS

## 2021-01-07 NOTE — Patient Instructions (Signed)
Please continue to take medications as prescribed.  Please perform salt water or warm water gargles at least twice in a day. If you develop difficulty swallowing or pain during swallowing, please contact us.  Please follow DASH diet and perform moderate exercise/walking as tolerated.

## 2021-01-07 NOTE — Assessment & Plan Note (Addendum)
BP Readings from Last 1 Encounters:  01/07/21 (!) 148/80   Usually stays normal On Lasix 40 mg QD for CKD Advised DASH diet and moderate exercise/walking as tolerated Follows up with Cardiology

## 2021-01-07 NOTE — Assessment & Plan Note (Signed)
Has only left kidney On Calcitriol and Lasix Avoid nephrotoxic agents Follows up with Nephrologist

## 2021-01-07 NOTE — Assessment & Plan Note (Signed)
S/p stent placement On Aspirin Eliquis for h/o A Fib Follows up with Cardiology
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
## 2021-01-07 NOTE — Assessment & Plan Note (Signed)
Rate-controlled On Eliquis Follows up with Cardiologist

## 2021-01-07 NOTE — Assessment & Plan Note (Addendum)
Due to ureteral ca. and its proximity to right kidney

## 2021-01-07 NOTE — Assessment & Plan Note (Signed)
On Aspirin and statin Follows up with Neurologist

## 2021-01-09 ENCOUNTER — Other Ambulatory Visit: Payer: Self-pay | Admitting: *Deleted

## 2021-01-09 MED ORDER — ATORVASTATIN CALCIUM 40 MG PO TABS: 40.0000 mg | ORAL_TABLET | Freq: Every evening | ORAL | 3 refills | Status: AC

## 2021-01-20 NOTE — Progress Notes (Addendum)
Cardiology Office Note:    Date:  01/21/2021   ID:  [name], DOB July 23, 1941, MRN [id number]  PCP:  Lindell Spar, MD   [place]  Cardiologist:  Sherren Mocha, MD  Advanced Practice Provider:  Liliane Shi, PA-C Electrophysiologist:  None       Referring MD: Alycia Rossetti, MD   Chief Complaint:  Follow-up (CAD, atrial fibrillation)    Patient Profile:    Quamere Mussell is a 80 y.o. male with:   Coronary artery disease  ? S/p NSTEMI in 2008 >> PCI: Cypher DES to prox and mid LAD  Permanent atrial fibrillation  ? 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   LHC 02/2007:  Proximal LAD 75%, mid LAD 99%, proximal RCA 25%.  PCI: Cypher DES to the proximal and mid LAD.   Echocardiogram  01/2010:  Mild LVH, EF 55-60%, mild AI, mild MR, severely dilated LA and RA.   History of Present Illness:    Mr. Magner was last seen in 12/21.  He was referred to pulmonology and underwent thoracentesis. Cytology was neg for malignant cells.  He returns for f/u.  He is here with his wife.  He brings in a list of his blood pressures.  Blood pressures at home have been optimal for the most part.  He has not had chest discomfort.  His breathing is improved.  He has not had orthopnea, significant leg edema or syncope.      Past Medical History:  Diagnosis Date  . Acute ischemic stroke ([place]) 09/29/2020  . Arthritis    shoulders and ribs   . Atrial  fibrillation ([place])    atrial fib/ LOV [name] PA 04/11/12 EPIC, - CHEST X RAY, EKG 5/13 EPIC  . CAD (coronary artery disease)     s/p NSTEMI 04/08;  [place] 02/2007: Proximal LAD 75%, mid LAD 99%, proximal RCA 25%.  PCI:  Cypher DES to the proximal and mid LAD.  Last nuclear study 11/2011 (after a trip to the ED with CP): EF 58%, low risk study with small inferior wall infarct at the mid and basal level, no ischemia.  Last echo 01/2010: Mild LVH, EF 55-60%, mild AI, mild MR, severely dilated LA and RA  . Closed fracture of rib of left side with delayed healing 07/25/2019  . Contrast dye induced nephropathy    Hx ARF secondary to contrast nephropathy  . Dyslipidemia   . Dysrhythmia   . Epididymitis   . Gallstones 05/30/2019  . History of blood transfusion   . Myocardial infarction ([place]) 2008  . Pleurisy    2012  . Pneumonia    hx of   . 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 Spinal stenosis   . Urothelial cancer ([place])    Dr. [name],  skin cancer non melanoma  . Wedge compression fracture of third lumbar vertebra with routine healing 05/30/2019  Current Medications: Current Meds  Medication Sig  . acetaminophen (TYLENOL) 650 MG CR tablet Take 650 mg by mouth every 8 (eight) hours as needed for pain.  Marland Kitchen apixaban (ELIQUIS) 5 MG TABS tablet Take 1 tablet (5 mg total) by mouth 2 (two) times daily.  Marland Kitchen aspirin 81 MG chewable tablet Chew 81 mg by mouth daily.  Marland Kitchen atorvastatin (LIPITOR) 40 MG tablet Take 1 tablet (40 mg total) by mouth every evening.  . calcitRIOL (ROCALTROL) 0.5 MCG capsule Take 0.5 mcg by mouth daily.  . furosemide (LASIX) 40 MG tablet TAKE 1 TABLET EVERY DAY  . niacin (NIASPAN) 500 MG CR tablet Take 1 tablet (500 mg total) by mouth at bedtime.  . nitroGLYCERIN (NITROSTAT) 0.4 MG SL tablet Place 1 tablet (0.4 mg total) under the tongue every 5 (five) minutes as needed for chest pain.  . Omega-3 Fatty Acids (FISH OIL) 1200 MG CAPS Take 1,200 mg by mouth daily.   . traZODone (DESYREL) 50 MG  tablet Take 1.5 tablets (75 mg total) by mouth at bedtime as needed for sleep.  . vitamin B-12 (CYANOCOBALAMIN) 500 MCG tablet Take 500 mcg by mouth daily.     Allergies:   Contrast media [iodinated diagnostic agents], Iodine-131, Other, and Promethazine   Social History   Tobacco Use  . Smoking status: Former Smoker    Packs/day: 2.00    Years: 10.00    Pack years: 20.00    Types: Cigarettes, Cigars    Quit date: 11/15/1981    Years since quitting: 39.2  . Smokeless tobacco: Never Used  Vaping Use  . Vaping Use: Never used  Substance Use Topics  . Alcohol use: Not Currently    Comment: SOCIAL  . Drug use: No     Family Hx: The patient's family history includes Cancer in his mother; Coronary artery disease in his father; Diabetes type II in his mother; Heart attack in his father; Hyperlipidemia in his mother.  Review of Systems  Gastrointestinal: Negative for hematochezia and melena.  Genitourinary: Negative for hematuria.     EKGs/Labs/Other Test Reviewed:    EKG:  EKG is not ordered today.  The ekg ordered today demonstrates n/a  Recent Labs: 09/29/2020: ALT 13 12/03/2020: BUN 19; Creat 1.32; Potassium 4.5; Sodium 141; TSH 5.13 12/24/2020: Hemoglobin 12.7; Platelets 252   Recent Lipid Panel Lab Results  Component Value Date/Time   CHOL 110 09/30/2020 03:34 AM   TRIG 54 09/30/2020 03:34 AM   HDL 30 (L) 09/30/2020 03:34 AM   CHOLHDL 3.7 09/30/2020 03:34 AM   LDLCALC 69 09/30/2020 03:34 AM   LDLCALC 76 04/11/2020 08:33 AM      Risk Assessment/Calculations:    CHA2DS2-VASc Score = 5  This indicates a 7.2% annual risk of stroke. The patient's score is based upon: CHF History: No HTN History: No Diabetes History: No Stroke History: Yes Vascular Disease History: Yes Age Score: 2 Gender Score: 0     Physical Exam:    VS:  BP 128/62   Pulse 75   Ht 6' (1.829 m)   Wt 189 lb (85.7 kg)   SpO2 97%   BMI 25.63 kg/m     Wt Readings from Last 3 Encounters:   01/21/21 189 lb (85.7 kg)  01/07/21 187 lb 6.4 oz (85 kg)  01/01/21 184 lb 6.4 oz (83.6 kg)     Constitutional:      Appearance: Healthy appearance. Not in distress.  Pulmonary:     Effort: Pulmonary effort  is normal.     Breath sounds: No wheezing. No rales.  Cardiovascular:     Normal rate. Regular rhythm. Normal S1. Normal S2.     Murmurs: There is no murmur.  Edema:    Ankle: bilateral trace edema of the ankle. Abdominal:     Palpations: Abdomen is soft.  Musculoskeletal:     Cervical back: Neck supple. Skin:    General: Skin is warm and dry.  Neurological:     General: No focal deficit present.     Mental Status: Alert and oriented to person, place and time.     Cranial Nerves: Cranial nerves are intact.      ASSESSMENT & PLAN:    1. Permanent atrial fibrillation (HCC) He is tolerating anticoagulation.  His last creatinine was 1.4.  I will repeat a BMET, CBC today.  If his creatinine is above 1.5, we will need to change his dose of Apixaban to 2.5 mg daily.  Follow-up 6 months.  2. Coronary artery disease involving native coronary artery of native heart without angina pectoris History of Cypher DES to the LAD in 2008 in the setting of non-STEMI.  He is not having anginal symptoms.  Continue statin.  He is asking if he can come off of aspirin.  I did review further with Dr. Burt Knack after his OV. It is ok to DC ASA.  Follow-up 6 months.  3. Stage 3a chronic kidney disease ([place]) He has a history of right nephrectomy.  He is followed by nephrology.  Repeat BMET today.  4. Pleural effusion on left Status post thoracentesis.  Continue follow-up with pulmonology as planned.  5. Hyperlipidemia He does note diffuse arthralgias.  I suspect that this is related to arthritis.  He can stop atorvastatin for 2 weeks.  If he has relief, he should contact us.  Otherwise, he should resume atorvastatin.       Dispo:  Return in about 6 months (around 07/24/2021) for Routine Follow Up,  w/ Dr. Burt Knack, in person.   Medication Adjustments/Labs and Tests Ordered: Current medicines are reviewed at length with the patient today.  Concerns regarding medicines are outlined above.  Tests Ordered: Orders Placed This Encounter  Procedures  . Basic Metabolic Panel (BMET)  . CBC   Medication Changes: No orders of the defined types were placed in this encounter.   Signed, Richardson Dopp, PA-C  01/21/2021 11:37 AM    [facility] [address], [place], [place]  [address] Phone: [phone]; Fax: [phone]

## 2021-01-21 ENCOUNTER — Telehealth: Payer: Self-pay | Admitting: Physician Assistant

## 2021-01-21 ENCOUNTER — Ambulatory Visit: Payer: Medicare HMO | Admitting: Physician Assistant

## 2021-01-21 ENCOUNTER — Encounter: Payer: Self-pay | Admitting: Physician Assistant

## 2021-01-21 ENCOUNTER — Other Ambulatory Visit: Payer: Self-pay

## 2021-01-21 VITALS — BP 128/62 | HR 75 | Ht 72.0 in | Wt 189.0 lb

## 2021-01-21 DIAGNOSIS — N1831 Chronic kidney disease, stage 3a: Secondary | ICD-10-CM | POA: Diagnosis not present

## 2021-01-21 DIAGNOSIS — I4821 Permanent atrial fibrillation: Secondary | ICD-10-CM | POA: Diagnosis not present

## 2021-01-21 DIAGNOSIS — E782 Mixed hyperlipidemia: Secondary | ICD-10-CM

## 2021-01-21 DIAGNOSIS — J90 Pleural effusion, not elsewhere classified: Secondary | ICD-10-CM | POA: Diagnosis not present

## 2021-01-21 DIAGNOSIS — I2510 Atherosclerotic heart disease of native coronary artery without angina pectoris: Secondary | ICD-10-CM

## 2021-01-21 NOTE — Telephone Encounter (Signed)
Please call the pt. I d/w Dr. Burt Knack.   He can stop his ASA.  PLAN:  DC ASA Do not stop Apixaban (Eliquis) Richardson Dopp, PA-C    01/21/2021 2:20 PM

## 2021-01-21 NOTE — Patient Instructions (Addendum)
Medication Instructions:  Your physician has recommended you make the following change in your medication:   1. You can hold Lipitor for 2 weeks if pain gets better go back on Lipitor, if pain does not improve call office @ [phone] or send a mychart message.   *If you need a refill on your cardiac medications before your next appointment, please call your pharmacy*   Lab Work: Your physician recommends that you have lab work today: bmet/cbc  If you have labs (blood work) drawn today and your tests are completely normal, you will receive your results only by: Marland Kitchen MyChart Message (if you have MyChart) OR . A paper copy in the mail If you have any lab test that is abnormal or we need to change your treatment, we will call you to review the results.   Testing/Procedures: -None   Follow-Up: At [facility], you and your health needs are our priority.  As part of our continuing mission to provide you with exceptional heart care, we have created designated Provider Care Teams.  These Care Teams include your primary Cardiologist (physician) and Advanced Practice Providers (APPs -  Physician Assistants and Nurse Practitioners) who all work together to provide you with the care you need, when you need it.  We recommend signing up for the patient portal called "MyChart".  Sign up information is provided on this After Visit Summary.  MyChart is used to connect with patients for Virtual Visits (Telemedicine).  Patients are able to view lab/test results, encounter notes, upcoming appointments, etc.  Non-urgent messages can be sent to your provider as well.   To learn more about what you can do with MyChart, go to [email or web address].    Your next appointment:   6 month(s)  The format for your next appointment:   In Person  Provider:   Sherren Mocha, MD   Other Instructions You can hold Lipitor for 2 weeks if pain get better go back on Lipitor if pain does not improve call office @  [phone] or send mychart message.   Your physician wants you to follow-up in: 6 months with Dr.Cooper.  You will receive a reminder letter in the mail two months in advance. If you don't receive a letter, please call our office to schedule the follow-up appointment.

## 2021-01-22 LAB — CBC
Hematocrit: 36.8 % — ABNORMAL LOW (ref 37.5–51.0)
Hemoglobin: 11.8 g/dL — ABNORMAL LOW (ref 13.0–17.7)
MCH: 28.7 pg (ref 26.6–33.0)
MCHC: 32.1 g/dL (ref 31.5–35.7)
MCV: 90 fL (ref 79–97)
Platelets: 230 10*3/uL (ref 150–450)
RBC: 4.11 x10E6/uL — ABNORMAL LOW (ref 4.14–5.80)
RDW: 13.7 % (ref 11.6–15.4)
WBC: 7.1 10*3/uL (ref 3.4–10.8)

## 2021-01-22 LAB — BASIC METABOLIC PANEL
BUN/Creatinine Ratio: 16 (ref 10–24)
BUN: 22 mg/dL (ref 8–27)
CO2: 22 mmol/L (ref 20–29)
Calcium: 8.6 mg/dL (ref 8.6–10.2)
Chloride: 105 mmol/L (ref 96–106)
Creatinine, Ser: 1.37 mg/dL — ABNORMAL HIGH (ref 0.76–1.27)
Glucose: 93 mg/dL (ref 65–99)
Potassium: 4.5 mmol/L (ref 3.5–5.2)
Sodium: 141 mmol/L (ref 134–144)
eGFR: 52 mL/min/{1.73_m2} — ABNORMAL LOW (ref >59)

## 2021-01-28 NOTE — Telephone Encounter (Signed)
Opt has been made aware, per phone note below, he can stop the Aspirin but will need to continue Eliquis. Pt verbalized understanding and was thankful for the call.

## 2021-02-04 ENCOUNTER — Ambulatory Visit: Payer: Medicare HMO | Admitting: Internal Medicine

## 2021-03-26 ENCOUNTER — Ambulatory Visit: Payer: Medicare HMO | Admitting: Internal Medicine

## 2021-03-26 ENCOUNTER — Other Ambulatory Visit: Payer: Self-pay | Admitting: Family Medicine

## 2021-03-26 ENCOUNTER — Ambulatory Visit (INDEPENDENT_AMBULATORY_CARE_PROVIDER_SITE_OTHER): Payer: Medicare HMO

## 2021-03-26 ENCOUNTER — Other Ambulatory Visit: Payer: Self-pay

## 2021-03-26 ENCOUNTER — Encounter: Payer: Self-pay | Admitting: Internal Medicine

## 2021-03-26 VITALS — BP 140/80 | HR 87 | Temp 97.9°F | Ht 72.0 in | Wt 187.2 lb

## 2021-03-26 DIAGNOSIS — J90 Pleural effusion, not elsewhere classified: Secondary | ICD-10-CM
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
## 2021-03-26 NOTE — Patient Instructions (Addendum)
ICD-10-CM   1. Pleural effusion, left  J90    Pleural effusion based on thoracentesis 12/26/2020 was nondiagnostic with lymphocytes  Post effusion repeat CXR today still shows fluid   Plan -recommend repeat thoracentensis with flow cytometry  - respect your desire to declnie but still committ only to serial observation  - respect desire not to seee surgeon  Follow-up -Do chest x-ray in early to mid August 2022 -Return to see Dr. Chase Caller or nurse practitioner in early to mid  Aug l 2022 -Return sooner if needed

## 2021-03-27 ENCOUNTER — Other Ambulatory Visit: Payer: Self-pay | Admitting: Internal Medicine

## 2021-03-27 ENCOUNTER — Telehealth: Payer: Self-pay

## 2021-03-27 DIAGNOSIS — G4700 Insomnia, unspecified: Secondary | ICD-10-CM

## 2021-03-27 MED ORDER — TRAZODONE HCL 50 MG PO TABS
75.0000 mg | ORAL_TABLET | Freq: Every evening | ORAL | 3 refills | Status: AC | PRN
Start: 2021-03-27 — End: ?

## 2021-03-27 NOTE — Telephone Encounter (Signed)
Refilled. Thanks!

## 2021-03-27 NOTE — Telephone Encounter (Signed)
Pt stopped by needs trazodone called into [facility]

## 2021-04-14 ENCOUNTER — Telehealth: Payer: Medicare HMO | Admitting: Internal Medicine

## 2021-04-14 ENCOUNTER — Other Ambulatory Visit: Payer: Self-pay

## 2021-04-14 ENCOUNTER — Encounter: Payer: Self-pay | Admitting: Internal Medicine

## 2021-04-14 DIAGNOSIS — U071 COVID-19: Secondary | ICD-10-CM

## 2021-04-14 MED ORDER — MOLNUPIRAVIR EUA 200MG CAPSULE: 4.0000 | ORAL_CAPSULE | Freq: Two times a day (BID) | ORAL | 0 refills | Status: AC

## 2021-04-14 MED ORDER — BENZONATATE 100 MG PO CAPS
100.0000 mg | ORAL_CAPSULE | Freq: Two times a day (BID) | ORAL | 0 refills | Status: DC | PRN
Start: 2021-04-14 — End: 2021-07-30

## 2021-04-14 NOTE — Patient Instructions (Signed)

## 2021-04-14 NOTE — Progress Notes (Signed)
Virtual Visit via Telephone Note   This visit type was conducted due to national recommendations for restrictions regarding the COVID-19 Pandemic (e.g. social distancing) in an effort to limit this patient's exposure and mitigate transmission in our community.  Due to his co-morbid illnesses, this patient is at least at moderate risk for complications without adequate follow up.  This format is felt to be most appropriate for this patient at this time.  The patient did not have access to video technology/had technical difficulties with video requiring transitioning to audio format only (telephone).  All issues noted in this document were discussed and addressed.  No physical exam could be performed with this format.  Evaluation Performed:  Follow-up visit  Date:  04/14/2021   ID:  [name] [name], DOB [birth year], MRN [id number]  Patient Location: Home Provider Location: Office/Clinic  Participants: Patient Location of Patient: Home Location of Provider: Telehealth Consent was obtain for visit to be over via telehealth. I verified that I am speaking with the correct person using two identifiers.  PCP:  Lindell Spar, MD   Chief Complaint:  Cough  History of Present Illness:    [name] [name] is a 80 y.o. male who has a televisit for c/o nausea, vomiting, diarrhea and cough for last 5 days. He states that he currently has only cough. Denies any dyspnea or wheezing. He tested positive for COVID yesterday.  The patient does have symptoms concerning for COVID-19 infection (fever, chills, cough, or new shortness of breath).   Past Medical, Surgical, Social History, Allergies, and Medications have been Reviewed.  Past Medical History:  Diagnosis Date  . Acute ischemic stroke ([place]) 09/29/2020  . Arthritis    shoulders and ribs   . Atrial fibrillation ([place])    atrial fib/ LOV [name] PA 04/11/12 EPIC, - CHEST X RAY, EKG 5/13 EPIC  . CAD (coronary artery disease)     s/p NSTEMI 04/08;  [facility]  02/2007: Proximal LAD 75%, mid LAD 99%, proximal RCA 25%.  PCI:  Cypher DES to the proximal and mid LAD.  Last nuclear study 11/2011 (after a trip to the ED with CP): EF 58%, low risk study with small inferior wall infarct at the mid and basal level, no ischemia.  Last echo 01/2010: Mild LVH, EF 55-60%, mild AI, mild MR, severely dilated LA and RA  . Closed fracture of rib of left side with delayed healing 07/25/2019  . Contrast dye induced nephropathy    Hx ARF secondary to contrast nephropathy  . Dyslipidemia   . Dysrhythmia   . Epididymitis   . Gallstones 05/30/2019  . History of blood transfusion   . Myocardial infarction ([place]) 2008  . Pleurisy    2012  . Pneumonia    hx of   . 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 Spinal stenosis   . Urothelial cancer ([place])    Dr. [name],  skin cancer non melanoma  . Wedge compression fracture of third lumbar vertebra with routine healing 05/30/2019   Past Surgical History:  Procedure Laterality Date  . arm surgery     orif right elbow  . CARDIAC CATHETERIZATION  2008  . coronary stents     . CYSTECTOMY  07/15/08  . CYSTOSCOPY WITH BIOPSY  03/23/2012   Procedure: CYSTOSCOPY WITH BIOPSY;  Surgeon: [name], MD;  [facility];  Service: Urology;  Laterality: Right;   BRUSH BIOPSY RIGHT URETERAL STENT  . CYSTOSCOPY WITH URETEROSCOPY  03/23/2012   Procedure: CYSTOSCOPY WITH URETEROSCOPY;  Surgeon: [name], MD;  [facility];  Service: Urology;  Laterality: Right;    **OR Room #8 requested**  C-ARM   . CYSTOSCOPY/RETROGRADE/URETEROSCOPY  02/15/2012   Procedure: CYSTOSCOPY/RETROGRADE/URETEROSCOPY;  Surgeon: [name], MD;  [facility];  Service: Urology;  Laterality: Right;  C-ARM  . IR THORACENTESIS ASP PLEURAL SPACE W/IMG GUIDE  12/26/2020  . JOINT REPLACEMENT     Dr. [name] 09-05-18   . left pinky finger      tip removed from accident  . right kidney removed      2013  . TOTAL KNEE ARTHROPLASTY Left 09/05/2018   Procedure: LEFT TOTAL  KNEE ARTHROPLASTY;  Surgeon: [name], MD;  Location: [facility];  Service: Orthopedics;  Laterality: Left;  70 mins with abductor block  . TOTAL KNEE ARTHROPLASTY Right 04/03/2019   Procedure: RIGHT TOTAL KNEE ARTHROPLASTY;  Surgeon: [name], MD;  Location: [facility];  Service: Orthopedics;  Laterality: Right;  70 mins  . URETER SURGERY       Current Meds  Medication Sig  . acetaminophen (TYLENOL) 650 MG CR tablet Take 650 mg by mouth every 8 (eight) hours as needed for pain.  Marland Kitchen apixaban (ELIQUIS) 5 MG TABS tablet Take 1 tablet (5 mg total) by mouth 2 (two) times daily.  Marland Kitchen atorvastatin (LIPITOR) 40 MG tablet Take 1 tablet (40 mg total) by mouth every evening.  . calcitRIOL (ROCALTROL) 0.5 MCG capsule Take 0.5 mcg by mouth daily.  . furosemide (LASIX) 40 MG tablet TAKE 1 TABLET EVERY DAY  . niacin (NIASPAN) 500 MG CR tablet Take 1 tablet (500 mg total) by mouth at bedtime.  . nitroGLYCERIN (NITROSTAT) 0.4 MG SL tablet Place 1 tablet (0.4 mg total) under the tongue every 5 (five) minutes as needed for chest pain.  . Omega-3 Fatty Acids (FISH OIL) 1200 MG CAPS Take 1,200 mg by mouth daily.   . traZODone (DESYREL) 50 MG tablet Take 1.5 tablets (75 mg total) by mouth at bedtime as needed for sleep.  . vitamin B-12 (CYANOCOBALAMIN) 500 MCG tablet Take 500 mcg by mouth daily.     Allergies:   Contrast media [iodinated diagnostic agents], Iodine-131, Other, and Promethazine   ROS:   Please see the history of present illness.     All other systems reviewed and are negative.   Labs/Other Tests and Data Reviewed:    Recent Labs: 09/29/2020: ALT 13 12/03/2020: TSH 5.13 01/21/2021: BUN 22; Creatinine, Ser 1.37; Hemoglobin 11.8; Platelets 230; Potassium 4.5; Sodium 141   Recent Lipid Panel Lab Results  Component Value Date/Time   CHOL 110 09/30/2020 03:34 AM   TRIG 54 09/30/2020 03:34 AM   HDL 30 (L) 09/30/2020 03:34 AM   CHOLHDL 3.7 09/30/2020 03:34 AM   LDLCALC 69 09/30/2020 03:34 AM    LDLCALC 76 04/11/2020 08:33 AM    Wt Readings from Last 3 Encounters:  03/26/21 187 lb 3.2 oz (84.9 kg)  01/21/21 189 lb (85.7 kg)  01/07/21 187 lb 6.4 oz (85 kg)      ASSESSMENT & PLAN:    COVID-19 infection Started Molnupiravir Tessalon PRN for cough Adequate hydration Self-quarantine for now  Time:   Today, I have spent 9 minutes reviewing the chart, including problem list, medications, and with the patient with telehealth technology discussing the above problems.   Medication Adjustments/Labs and Tests Ordered: Current medicines are reviewed at length with the patient today.  Concerns regarding medicines are outlined  above.   Tests Ordered: No orders of the defined types were placed in this encounter.   Medication Changes: No orders of the defined types were placed in this encounter.    Note: This dictation was prepared with Dragon dictation along with smaller phrase technology. Similar sounding words can be transcribed inadequately or may not be corrected upon review. Any transcriptional errors that result from this process are unintentional.      Disposition:  Follow up  Signed, Lindell Spar, MD  04/14/2021 4:38 PM     [place]

## 2021-04-15 ENCOUNTER — Telehealth: Payer: Medicare HMO | Admitting: Internal Medicine

## 2021-04-16 ENCOUNTER — Other Ambulatory Visit: Payer: Self-pay | Admitting: *Deleted

## 2021-04-16 MED ORDER — APIXABAN 5 MG PO TABS: 5.0000 mg | ORAL_TABLET | Freq: Two times a day (BID) | ORAL | 1 refills | Status: DC

## 2021-05-07 ENCOUNTER — Other Ambulatory Visit: Payer: Self-pay

## 2021-05-07 ENCOUNTER — Ambulatory Visit (INDEPENDENT_AMBULATORY_CARE_PROVIDER_SITE_OTHER): Payer: Medicare HMO | Admitting: Internal Medicine

## 2021-05-07 ENCOUNTER — Encounter: Payer: Self-pay | Admitting: Internal Medicine

## 2021-05-07 VITALS — BP 138/84 | HR 69 | Temp 98.6°F | Resp 18 | Ht 72.0 in | Wt 185.0 lb

## 2021-05-07 DIAGNOSIS — I10 Essential (primary) hypertension: Secondary | ICD-10-CM

## 2021-05-07 DIAGNOSIS — B351 Tinea unguium: Secondary | ICD-10-CM

## 2021-05-07 DIAGNOSIS — I4821 Permanent atrial fibrillation: Secondary | ICD-10-CM | POA: Diagnosis not present

## 2021-05-07 DIAGNOSIS — G4700 Insomnia, unspecified: Secondary | ICD-10-CM | POA: Diagnosis not present

## 2021-05-07 DIAGNOSIS — N1831 Chronic kidney disease, stage 3a: Secondary | ICD-10-CM | POA: Diagnosis not present

## 2021-05-07 DIAGNOSIS — I2510 Atherosclerotic heart disease of native coronary artery without angina pectoris: Secondary | ICD-10-CM | POA: Diagnosis not present

## 2021-05-07 DIAGNOSIS — J312 Chronic pharyngitis: Secondary | ICD-10-CM

## 2021-05-07 MED ORDER — TERBINAFINE HCL 250 MG PO TABS: 250.0000 mg | ORAL_TABLET | Freq: Every day | ORAL | 2 refills | Status: DC

## 2021-05-07 NOTE — Assessment & Plan Note (Signed)
BP Readings from Last 1 Encounters:  05/07/21 138/84   Well-controlled On Lasix 40 mg QD for CKD Advised DASH diet and moderate exercise/walking as tolerated Follows up with Cardiology

## 2021-05-07 NOTE — Assessment & Plan Note (Signed)
Has tried [facility] magic mouthwash and oral rinses with no relief Denies any other complaints of URTI Referred to ENT for further evaluation

## 2021-05-07 NOTE — Assessment & Plan Note (Signed)
Better with Trazodone 75 mg qHS

## 2021-05-07 NOTE — Assessment & Plan Note (Signed)
Rate-controlled On Eliquis Follows up with Cardiologist

## 2021-05-07 NOTE — Progress Notes (Signed)
Established Patient Office Visit  Subjective:  Patient ID: [name] [name], male    DOB: May 14, 1941  Age: 80 y.o. MRN: [id number]  CC:  Chief Complaint  Patient presents with   Follow-up    4 month follow up would like his left 2nd toe looked at also discuss his throat     HPI [name] [name] is a 80 year old male with PMH of CAD s/p stent placement, A Fib on Eliquis, CVA, HTN, GERD, h/o right nephrectomy for ureteral ca., CKD stage 3 and insomnia who  presents for follow up of her chronic medical conditions.  A Fib: Rate controlled, on Eliquis for AC. Denies any dyspnea or palpitations.  BP is well-controlled. Takes medications regularly. Patient denies headache, dizziness, chest pain, dyspnea or palpitations.  He has been chronic throat irritation and has tried magic mouthwash prescribed by previous PCP, but has not helped much. He denies any runny nose, sinus pain or pressure. Denies any dysphagia or odynophagia.  He also c/o yellowish toenails, especially second toe of left foot, which has been dark, brownish. Denies any recent injury.  Past Medical History:  Diagnosis Date   Acute ischemic stroke ([place]) 09/29/2020   Acute myocardial infarction of other inferior wall, episode of care unspecified 03/02/2007   Arthritis    shoulders and ribs    Atrial fibrillation ([place])    atrial fib/ LOV S Weaver PA 04/11/12 EPIC, - CHEST X RAY, EKG 5/13 EPIC   CAD (coronary artery disease)     s/p NSTEMI 04/08;  LHC 02/2007: Proximal LAD 75%, mid LAD 99%, proximal RCA 25%.  PCI:  Cypher DES to the proximal and mid LAD.  Last nuclear study 11/2011 (after a trip to the ED with CP): EF 58%, low risk study with small inferior wall infarct at the mid and basal level, no ischemia.  Last echo 01/2010: Mild LVH, EF 55-60%, mild AI, mild MR, severely dilated LA and RA   Closed fracture of rib of left side with delayed healing 07/25/2019   Contrast dye induced nephropathy    Hx ARF secondary to contrast nephropathy    Dyslipidemia    Dysrhythmia    Elevated blood-pressure reading, without diagnosis of hypertension 11/07/2019   Epididymitis    Gallstones 05/30/2019   History of blood transfusion    Myocardial infarction ([place]) 2008   Pleurisy    2012   Pneumonia    hx of    Renal cyst    Left; 20 cm   Spinal stenosis    Urothelial cancer ([place])    Dr. [name],  skin cancer non melanoma   [place]edge compression fracture of third lumbar vertebra with routine healing 05/30/2019    Past Surgical History:  Procedure Laterality Date   arm surgery     orif right elbow   CARDIAC CATHETERIZATION  2008   coronary stents      CYSTECTOMY  07/15/08   CYSTOSCOPY WITH BIOPSY  03/23/2012   Procedure: CYSTOSCOPY WITH BIOPSY;  Surgeon: [name], MD;  Location: [facility];  Service: Urology;  Laterality: Right;   BRUSH BIOPSY RIGHT URETERAL STENT   CYSTOSCOPY WITH URETEROSCOPY  03/23/2012   Procedure: CYSTOSCOPY WITH URETEROSCOPY;  Surgeon: [name], MD;  Location: [facility];  Service: Urology;  Laterality: Right;    **OR Room #8 requested**  C-ARM    CYSTOSCOPY/RETROGRADE/URETEROSCOPY  02/15/2012   Procedure: CYSTOSCOPY/RETROGRADE/URETEROSCOPY;  Surgeon: [name], MD;  Location: [facility];  Service: Urology;  Laterality: Right;  C-ARM  IR THORACENTESIS ASP PLEURAL SPACE W/IMG GUIDE  12/26/2020   JOINT REPLACEMENT     Dr. 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   Procedure: LEFT TOTAL KNEE ARTHROPLASTY;  Surgeon: [name], MD;  Location: [facility];  Service: Orthopedics;  Laterality: Left;  70 mins with abductor block   TOTAL KNEE ARTHROPLASTY Right 04/03/2019   Procedure: RIGHT TOTAL KNEE ARTHROPLASTY;  Surgeon: [name], MD;  Location: [facility];  Service: Orthopedics;  Laterality: Right;  70 mins   URETER SURGERY      Family History  Problem Relation Age of Onset   Cancer Mother    Diabetes type II  Mother    Hyperlipidemia Mother    Coronary artery disease Father    Heart attack Father     Social History   Socioeconomic History   Marital status: Married    Spouse name: [name] [name]   Number of children: 3   Years of education: 12   Highest education level: Not on file  Occupational History   Occupation: Retired  Tobacco Use   Smoking status: Former    Packs/day: 2.00    Years: 10.00    Pack years: 20.00    Types: Cigarettes, Cigars    Quit date: 11/15/1981    Years since quitting: 39.5   Smokeless tobacco: Never  Vaping Use   Vaping Use: Never used  Substance and Sexual Activity   Alcohol use: Not Currently    Comment: SOCIAL   Drug use: No   Sexual activity: Not Currently  Other Topics Concern   Not on file  Social History Narrative   Lives in [place], [place] with wife. Exercising 3-4x/week.    Social Determinants of Health   Financial Resource Strain: Not on file  Food Insecurity: Not on file  Transportation Needs: Not on file  Physical Activity: Not on file  Stress: Not on file  Social Connections: Not on file  Intimate Partner Violence: Not on file    Outpatient Medications Prior to Visit  Medication Sig Dispense Refill   acetaminophen (TYLENOL) 650 MG CR tablet Take 650 mg by mouth every 8 (eight) hours as needed for pain.     apixaban (ELIQUIS) 5 MG TABS tablet Take 1 tablet (5 mg total) by mouth 2 (two) times daily. 180 tablet 1   atorvastatin (LIPITOR) 40 MG tablet Take 1 tablet (40 mg total) by mouth every evening. 90 tablet 3   benzonatate (TESSALON) 100 MG capsule Take 1 capsule (100 mg total) by mouth 2 (two) times daily as needed for cough. 20 capsule 0   calcitRIOL (ROCALTROL) 0.5 MCG capsule Take 0.5 mcg by mouth daily.     furosemide (LASIX) 40 MG tablet TAKE 1 TABLET EVERY DAY 90 tablet 3   niacin (NIASPAN) 500 MG CR tablet Take 1 tablet (500 mg total) by mouth at bedtime.     nitroGLYCERIN (NITROSTAT) 0.4 MG SL tablet Place 1 tablet (0.4 mg  total) under the tongue every 5 (five) minutes as needed for chest pain. 25 tablet 4   Omega-3 Fatty Acids (FISH OIL) 1200 MG CAPS Take 1,200 mg by mouth daily.      traZODone (DESYREL) 50 MG tablet Take 1.5 tablets (75 mg total) by mouth at bedtime as needed for sleep. 135 tablet 3   vitamin B-12 (CYANOCOBALAMIN)  500 MCG tablet Take 500 mcg by mouth daily.     No facility-administered medications prior to visit.    Allergies  Allergen Reactions   Contrast Media [Iodinated Diagnostic Agents]     Contrast-induced nephropathy requiring dialysis in 02/2007.   Iodine-131 Other (See Comments)    Kidneys shut down   Other Other (See Comments)    Beta blockers cause bradycardia   Promethazine Other (See Comments)    Pt became drowsy and mild altered mental status    ROS Review of Systems  Constitutional:  Negative for chills and fever.  HENT:  Positive for sore throat. Negative for congestion and trouble swallowing.   Eyes:  Negative for pain and discharge.  Respiratory:  Negative for cough and shortness of breath.   Cardiovascular:  Negative for chest pain and palpitations.  Gastrointestinal:  Negative for constipation, diarrhea, nausea and vomiting.  Endocrine: Negative for polydipsia and polyuria.  Genitourinary:  Negative for dysuria and hematuria.  Musculoskeletal:  Negative for neck pain and neck stiffness.  Skin:  Negative for rash.  Neurological:  Negative for dizziness, weakness, numbness and headaches.  Psychiatric/Behavioral:  Negative for agitation and behavioral problems.      Objective:    Physical Exam Vitals reviewed.  Constitutional:      General: He is not in acute distress.    Appearance: He is not diaphoretic.  HENT:     Head: Normocephalic and atraumatic.     Nose: Nose normal.     Mouth/Throat:     Mouth: Mucous membranes are moist.     Pharynx: No oropharyngeal exudate or posterior oropharyngeal erythema.  Eyes:     General: No scleral icterus.     Extraocular Movements: Extraocular movements intact.  Cardiovascular:     Rate and Rhythm: Normal rate and regular rhythm.     Pulses: Normal pulses.     Heart sounds: Normal heart sounds. No murmur heard. Pulmonary:     Breath sounds: Normal breath sounds. No wheezing or rales.  Musculoskeletal:     Cervical back: Neck supple. No tenderness.     Right lower leg: No edema.     Left lower leg: No edema.  Feet:     Right foot:     Toenail Condition: Fungal disease present.    Left foot:     Toenail Condition: Fungal disease present. Skin:    General: Skin is warm.     Findings: No rash.  Neurological:     General: No focal deficit present.     Mental Status: He is alert and oriented to person, place, and time.  Psychiatric:        Mood and Affect: Mood normal.        Behavior: Behavior normal.    BP 138/84 (BP Location: Left Arm, Cuff Size: Normal)   Pulse 69   Temp 98.6 F (37 C) (Oral)   Resp 18   Ht 6' (1.829 m)   Wt 185 lb 0.6 oz (83.9 kg)   SpO2 99%   BMI 25.10 kg/m  Wt Readings from Last 3 Encounters:  05/07/21 185 lb 0.6 oz (83.9 kg)  03/26/21 187 lb 3.2 oz (84.9 kg)  01/21/21 189 lb (85.7 kg)     Health Maintenance Due  Topic Date Due   Zoster Vaccines- Shingrix (1 of 2) Never done   COVID-19 Vaccine (4 - Booster for Moderna series) 03/26/2021    There are no preventive care reminders to display for this patient.  Lab Results  Component Value Date   TSH 5.13 (H) 12/03/2020   Lab Results  Component Value Date   WBC 7.1 01/21/2021   HGB 11.8 (L) 01/21/2021   HCT 36.8 (L) 01/21/2021   MCV 90 01/21/2021   PLT 230 01/21/2021   Lab Results  Component Value Date   NA 141 01/21/2021   K 4.5 01/21/2021   CO2 22 01/21/2021   GLUCOSE 93 01/21/2021   BUN 22 01/21/2021   CREATININE 1.37 (H) 01/21/2021   BILITOT 0.9 09/29/2020   ALKPHOS 71 09/29/2020   AST 17 09/29/2020   ALT 13 09/29/2020   PROT 6.8 12/24/2020   ALBUMIN 4.0 12/24/2020   CALCIUM  8.6 01/21/2021   ANIONGAP 8 10/01/2020   EGFR 52 (L) 01/21/2021   Lab Results  Component Value Date   CHOL 110 09/30/2020   Lab Results  Component Value Date   HDL 30 (L) 09/30/2020   Lab Results  Component Value Date   LDLCALC 69 09/30/2020   Lab Results  Component Value Date   TRIG 54 09/30/2020   Lab Results  Component Value Date   CHOLHDL 3.7 09/30/2020   Lab Results  Component Value Date   HGBA1C 5.9 (H) 09/30/2020      Assessment & Plan:   Problem List Items Addressed This Visit       Cardiovascular and Mediastinum   CAD (coronary artery disease), native coronary artery    S/p stent placement On Aspirin Eliquis for h/o A Fib Follows up with Cardiology       Relevant Orders   Lipid panel   Atrial fibrillation ([place]) - Primary    Rate-controlled On Eliquis Follows up with Cardiologist       Relevant Orders   CBC with Differential/Platelet   CMP14+EGFR   TSH + free T4   Benign essential hypertension    BP Readings from Last 1 Encounters:  05/07/21 138/84  Well-controlled On Lasix 40 mg QD for CKD Advised DASH diet and moderate exercise/walking as tolerated Follows up with Cardiology         Musculoskeletal and Integument   Onychomycosis   Relevant Medications   terbinafine (LAMISIL) 250 MG tablet     Genitourinary   Chronic kidney disease, stage III (moderate) (HCC)    Has only left kidney On Calcitriol and Lasix Avoid nephrotoxic agents Follows up with Nephrologist       Relevant Orders   CMP14+EGFR     Other   Insomnia    Better with Trazodone 75 mg qHS       Chronic sore throat    Has tried [facility] magic mouthwash and oral rinses with no relief Denies any other complaints of URTI Referred to ENT for further evaluation       Relevant Orders   Ambulatory referral to ENT    Meds ordered this encounter  Medications   terbinafine (LAMISIL) 250 MG tablet    Sig: Take 1 tablet (250 mg total) by mouth daily.     Dispense:  28 tablet    Refill:  2    Follow-up: Return in about 6 months (around 11/06/2021) for Annual physical.    Lindell Spar, MD

## 2021-05-07 NOTE — Assessment & Plan Note (Signed)
S/p stent placement On Aspirin Eliquis for h/o A Fib Follows up with Cardiology

## 2021-05-07 NOTE — Assessment & Plan Note (Signed)
Has only left kidney On Calcitriol and Lasix Avoid nephrotoxic agents Follows up with Nephrologist

## 2021-05-07 NOTE — Patient Instructions (Signed)
Please start taking Terbinafine for toenail infection.  Please continue taking other medications as prescribed.  You are being referred to ENT specialist for chronic sore throat.  Please continue to follow low salt diet and perform moderate exercise/walking at least 150 mins/week.

## 2021-05-08 ENCOUNTER — Telehealth: Payer: Self-pay

## 2021-05-08 DIAGNOSIS — R350 Frequency of micturition: Secondary | ICD-10-CM | POA: Diagnosis not present

## 2021-05-08 DIAGNOSIS — C661 Malignant neoplasm of right ureter: Secondary | ICD-10-CM | POA: Diagnosis not present

## 2021-05-08 DIAGNOSIS — N401 Enlarged prostate with lower urinary tract symptoms: Secondary | ICD-10-CM | POA: Diagnosis not present

## 2021-05-08 NOTE — Telephone Encounter (Signed)
Pt advised medication is okay to take as prescribed with other medications.

## 2021-05-08 NOTE — Telephone Encounter (Signed)
Patient called asking if this medication terbinafine (LAMISIL) 250 MG tablet  is safe to take with his other medication. Call back [phone]

## 2021-05-25 NOTE — Progress Notes (Signed)
Reviewed old abuse to keep legs elevated [facility] [place]. [place] [id number] [phone]       STROKE FOLLOW UP NOTE  [name] [name] Date of Birth:  1941-06-14 Medical Record Number:  [id number]   Reason for Referral: stroke follow up    SUBJECTIVE:   CHIEF COMPLAINT:  Chief Complaint  Patient presents with   Cerebrovascular Accident    Rm 1, 6 month FU  Wife- [name] [name]     HPI:   Today, 05/26/2021, [name] [name] returns for 12-month stroke follow-up accompanied by his wife, [name] [name].  Stable since prior visit without new stroke/TIA symptoms.  He does report occasional word finding difficulty which he did experience slightly prior to his stroke and slightly worsened after his stroke.  Compliant on Eliquis and atorvastatin without associated side effects.  Blood pressure today 150/77.  Occasionally monitors at home and typically stable.  No further stroke concerns at this time.    History provided for reference purposes only Initial visit 11/18/2020 JM: [name] [name] is being seen for hospital follow-up.  He has been doing well since discharge without residual deficits and denies new or reoccurring stroke/TIA symptoms.  Remains on aspirin and Eliquis without bleeding but does have mild bruising.  Remains on atorvastatin 40 mg daily without myalgias.  Blood pressure today 131/78.  Previously monitoring at home after discharge which was stable -not currently monitoring.  No concerns at this time.  Stroke admission 09/30/2019 [name] [name] is a 80 y.o. male with history of afib, CAD, HLD, right nephrectomy presented on 09/29/2020 for right side weakness and aphasia.  Personally reviewed hospitalization pertinent progress notes, lab work and imaging with summary provided.  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  TTE showed normal EF with small mobile echodensity on ventricular aspect of AV  posteriorly and per notes, evaluated by Dr. Johnsie Cancel who felt TEE not needed at that time and advised outpatient follow-up with cardiology.  Per hospital notes, previously on Xarelto but not able to continue due to cost and recently declined DOAC and recent follow-up visit with cardiologist Dr. Burt Knack.  On aspirin PTA for hx of CAD s/p LAD stenting and recommend initiating Eliquis in addition for secondary stroke prevention. Hx of HTN stable with resuming home meds.  Hx of HLD on atorvastatin 40 mg daily with LDL 69.  Other stroke risk factors include advanced age, former tobacco use, EtOH use, hx of stroke by imaging and CAD s/p LAD stenting.  Evaluated by therapies and discharged home in stable condition without therapy needs.   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 chronic left BG infarct MRA head and neck left M2 occlusion and another left M2 branch high grade stenosis. B/l P3/4 stenosis 2D Echo  EF 55%.  There is a small independent mobile echodensity on the ventricular aspect of aortic valve. LDL 69 HgbA1c 5.9 Heparin IV for VTE prophylaxis aspirin 81 mg daily prior to admission, now on Eliquis. Continue ASA 81 due to CAD s/p LAD stenting Patient counseled to be compliant with his antithrombotic medications Ongoing aggressive stroke risk factor management Therapy recommendations: None Disposition: Likely home tomorrow      ROS:   14 system review of systems performed and negative with exception of joint pain  PMH:  Past Medical History:  Diagnosis Date   Acute ischemic stroke ([place]) 09/29/2020   Acute myocardial infarction of other inferior wall, episode of care unspecified 03/02/2007   Arthritis    shoulders and ribs    Atrial fibrillation ([place])    atrial fib/ LOV S Weaver PA 04/11/12 EPIC, - CHEST X RAY, EKG 5/13 EPIC   CAD (coronary artery  disease)     s/p NSTEMI 04/08;  LHC 02/2007: Proximal LAD 75%, mid LAD 99%, proximal RCA 25%.  PCI:  Cypher DES to the proximal and mid LAD.  Last nuclear study 11/2011 (after a trip to the ED with CP): EF 58%, low risk study with small inferior wall infarct at the mid and basal level, no ischemia.  Last echo 01/2010: Mild LVH, EF 55-60%, mild AI, mild MR, severely dilated LA and RA   Closed fracture of rib of left side with delayed healing 07/25/2019   Contrast dye induced nephropathy    Hx ARF secondary to contrast nephropathy   Dyslipidemia    Dysrhythmia    Elevated blood-pressure reading, without diagnosis of hypertension 11/07/2019   Epididymitis    Gallstones 05/30/2019   History of blood transfusion    Myocardial infarction ([place]) 2008   Pleurisy    2012   Pneumonia    hx of    Renal cyst    Left; 20 cm   Spinal stenosis    Urothelial cancer ([place])    Dr. [name],  skin cancer non melanoma   Wedge compression fracture of third lumbar vertebra with routine healing 05/30/2019    PSH:  Past Surgical History:  Procedure Laterality Date   arm surgery     orif right elbow   CARDIAC CATHETERIZATION  2008   coronary stents      CYSTECTOMY  07/15/08   CYSTOSCOPY WITH BIOPSY  03/23/2012   Procedure: CYSTOSCOPY WITH BIOPSY;  Surgeon: [name], MD;  Location: [facility];  Service: Urology;  Laterality: Right;   BRUSH BIOPSY RIGHT URETERAL STENT   CYSTOSCOPY WITH URETEROSCOPY  03/23/2012   Procedure: CYSTOSCOPY WITH URETEROSCOPY;  Surgeon: [name], MD;  Location: [facility];  Service: Urology;  Laterality: Right;    **OR Room #8 requested**  C-ARM    CYSTOSCOPY/RETROGRADE/URETEROSCOPY  02/15/2012   Procedure: CYSTOSCOPY/RETROGRADE/URETEROSCOPY;  Surgeon: [name], MD;  Location: [facility];  Service: Urology;  Laterality: Right;  C-ARM   IR THORACENTESIS ASP PLEURAL SPACE W/IMG GUIDE  12/26/2020   JOINT REPLACEMENT     Dr. [name] 09-05-18    left pinky finger      tip removed from accident    right kidney removed      2013   TOTAL KNEE ARTHROPLASTY Left 09/05/2018   Procedure: LEFT TOTAL KNEE ARTHROPLASTY;  Surgeon: [name], MD;  Location: [facility];  Service: Orthopedics;  Laterality: Left;  70 mins with abductor block   TOTAL KNEE ARTHROPLASTY Right 04/03/2019   Procedure: RIGHT TOTAL KNEE ARTHROPLASTY;  Surgeon: [name], MD;  Location: [facility];  Service: Orthopedics;  Laterality: Right;  70 mins   URETER SURGERY      Social History:  Social History   Socioeconomic History   Marital status: Married    Spouse name: jackie   Number of children: 3   Years of education: 12   Highest education level: Not on file  Occupational History   Occupation: Retired  Tobacco Use   Smoking status: Former    Packs/day: 2.00    Years:  10.00    Pack years: 20.00    Types: Cigarettes, Cigars    Quit date: 11/15/1981    Years since quitting: 39.5   Smokeless tobacco: Never  Vaping Use   Vaping Use: Never used  Substance and Sexual Activity   Alcohol use: Not Currently    Comment: SOCIAL   Drug use: No   Sexual activity: Not Currently  Other Topics Concern   Not on file  Social History Narrative   Lives in [place], [place] with wife. Exercising 3-4x/week.    Social Determinants of Health   Financial Resource Strain: Not on file  Food Insecurity: Not on file  Transportation Needs: Not on file  Physical Activity: Not on file  Stress: Not on file  Social Connections: Not on file  Intimate Partner Violence: Not on file    Family History:  Family History  Problem Relation Age of Onset   Cancer Mother    Diabetes type II Mother    Hyperlipidemia Mother    Coronary artery disease Father    [name] attack Father     Medications:   Current Outpatient Medications on File Prior to Visit  Medication Sig Dispense Refill   acetaminophen (TYLENOL) 650 MG CR tablet Take 650 mg by mouth every 8 (eight) hours as needed for pain.     apixaban (ELIQUIS) 5 MG TABS tablet Take  1 tablet (5 mg total) by mouth 2 (two) times daily. 180 tablet 1   atorvastatin (LIPITOR) 40 MG tablet Take 1 tablet (40 mg total) by mouth every evening. 90 tablet 3   benzonatate (TESSALON) 100 MG capsule Take 1 capsule (100 mg total) by mouth 2 (two) times daily as needed for cough. 20 capsule 0   calcitRIOL (ROCALTROL) 0.5 MCG capsule Take 0.5 mcg by mouth daily.     furosemide (LASIX) 40 MG tablet TAKE 1 TABLET EVERY DAY 90 tablet 3   niacin (NIASPAN) 500 MG CR tablet Take 1 tablet (500 mg total) by mouth at bedtime.     nitroGLYCERIN (NITROSTAT) 0.4 MG SL tablet Place 1 tablet (0.4 mg total) under the tongue every 5 (five) minutes as needed for chest pain. 25 tablet 4   Omega-3 Fatty Acids (FISH OIL) 1200 MG CAPS Take 1,200 mg by mouth daily.      terbinafine (LAMISIL) 250 MG tablet Take 1 tablet (250 mg total) by mouth daily. 28 tablet 2   traZODone (DESYREL) 50 MG tablet Take 1.5 tablets (75 mg total) by mouth at bedtime as needed for sleep. 135 tablet 3   vitamin B-12 (CYANOCOBALAMIN) 500 MCG tablet Take 500 mcg by mouth daily.     No current facility-administered medications on file prior to visit.    Allergies:   Allergies  Allergen Reactions   Contrast Media [Iodinated Diagnostic Agents]     Contrast-induced nephropathy requiring dialysis in 02/2007.   Iodine-131 Other (See Comments)    Kidneys shut down   Other Other (See Comments)    Beta blockers cause bradycardia   Promethazine Other (See Comments)    Pt became drowsy and mild altered mental status      OBJECTIVE:  Physical Exam  Vitals:   05/26/21 0739  BP: (!) 150/77  Pulse: 66  Weight: 188 lb (85.3 kg)  Height: 6' (1.829 m)    Body mass index is 25.5 kg/m. No results found.  General: well developed, well nourished,  pleasant elderly Caucasian male, seated, in no evident distress Head: head normocephalic and  atraumatic.   Neck: supple with no carotid or supraclavicular bruits Cardiovascular:  irregular rate and rhythm, no murmurs Musculoskeletal: no deformity Skin:  no rash/petichiae Vascular:  Normal pulses all extremities   Neurologic Exam Mental Status: Awake and fully alert.   Fluent speech and language.  Oriented to place and time. Recent and remote memory intact. Attention span, concentration and fund of knowledge appropriate. Mood and affect appropriate.  Cranial Nerves: Pupils equal, briskly reactive to light. Extraocular movements full without nystagmus. Visual fields full to confrontation.  HOH bilaterally. Facial sensation intact. Face, tongue, palate moves normally and symmetrically.  Motor: Normal bulk and tone. Normal strength in all tested extremity muscles Sensory.: intact to touch , pinprick , position and vibratory sensation.  Coordination: Rapid alternating movements normal in all extremities. Finger-to-nose and heel-to-shin performed accurately bilaterally. Gait and Station: Arises from chair without difficulty. Stance is normal. Gait demonstrates normal stride length and balance without use of assistive device. Reflexes: 1+ and symmetric. Toes downgoing.         ASSESSMENT: [name] [name] is a 80 y.o. 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 Vascular risk factors include permanent A. fib, HTN, HLD, advanced age, former tobacco use, hx of stroke by imaging and CAD s/p LAD stenting.      PLAN:  L frontal WM and insular infarct :  Recovered well with only occasional/intermittent mild word finding difficulty Continue Eliquis (apixaban) daily and atorvastatin 40 mg daily for secondary stroke prevention measures Discussed secondary stroke prevention measures and importance of close PCP follow up for aggressive stroke risk factor management  Persistent A. Fib: On Eliquis 5 mg twice daily for CHA2DS2-VASc score of at  least 6.  Routinely followed by cardiology HTN: BP goal <130/90.  Stable on furosemide per PCP/cardiology HLD: LDL goal <70.  Prior LDL 69 on atorvastatin 40 mg daily per PCP. PCP recently placed orders for lab work -encouraged him to have this completed    Overall stable from stroke standpoint and recommend follow-up appointment as-needed basis   CC:  [facility] provider: Dr. Estrellita Ludwig, Colin Broach, MD    I spent 33 minutes of face-to-face and non-face-to-face time with patient and wife.  This included previsit chart review, lab review, study review, electronic health record documentation, patient and wife education and discussion regarding prior stroke and etiology, secondary stroke prevention measures and importance of managing stroke risk factors and answered all other questions to patient and wife's satisfaction   Frann Rider, AGNP-BC  [facility] [address] [place], [place] [address]  Phone [phone] Fax [phone] Note: This document was prepared with digital dictation and possible smart phrase technology. Any transcriptional errors that result from this process are unintentional.

## 2021-05-26 ENCOUNTER — Ambulatory Visit: Payer: Medicare HMO | Admitting: Adult Health

## 2021-05-26 ENCOUNTER — Encounter: Payer: Self-pay | Admitting: Adult Health

## 2021-05-26 VITALS — BP 150/77 | HR 66 | Ht 72.0 in | Wt 188.0 lb

## 2021-05-26 DIAGNOSIS — I4821 Permanent atrial fibrillation: Secondary | ICD-10-CM | POA: Diagnosis not present

## 2021-05-26 DIAGNOSIS — I10 Essential (primary) hypertension: Secondary | ICD-10-CM

## 2021-05-26 DIAGNOSIS — E785 Hyperlipidemia, unspecified: Secondary | ICD-10-CM

## 2021-05-26 DIAGNOSIS — I63412 Cerebral infarction due to embolism of left middle cerebral artery: Secondary | ICD-10-CM

## 2021-05-26 NOTE — Patient Instructions (Addendum)
Continue Eliquis (apixaban) daily  and atorvastatin for secondary stroke prevention  Continue to follow with cardiology for atrial fibrillation and Eliquis management  Continue to follow up with PCP regarding cholesterol and blood pressure management  Maintain strict control of hypertension with blood pressure goal below 130/90 and cholesterol with LDL cholesterol (bad cholesterol) goal below 70 mg/dL.   Obtain lab work at any [employer] that was placed by PCP        Thank you for coming to see [place] at [facility]. I hope we have been able to provide you high quality care today.  You may receive a patient satisfaction survey over the next few weeks. We would appreciate your feedback and comments so that we may continue to improve ourselves and the health of our patients   Stroke Prevention Some medical conditions and lifestyle choices can lead to a higher risk for a stroke. You can help to prevent a stroke by eating healthy foods and exercising. It also helps to not smoke and to manage any health problems youmay have. How can this condition affect me? A stroke is an emergency. It should be treated right away. A stroke can lead to brain damage or threaten your life. There is a better chance of surviving andgetting better after a stroke if you get medical help right away. What can increase my risk? The following medical conditions may increase your risk of a stroke: Diseases of the heart and blood vessels (cardiovascular disease). High blood pressure (hypertension). Diabetes. High cholesterol. Sickle cell disease. Problems with blood clotting. Being very overweight. Sleeping problems (obstructivesleep apnea). Other risk factors include: Being older than age 17. A history of blood clots, stroke, or mini-stroke (TIA). Race, ethnic background, or a family history of stroke. Smoking or using tobacco products. Taking birth control pills, especially if you smoke. Heavy  alcohol and drug use. Not being active. What actions can I take to prevent this? Manage your health conditions High cholesterol. Eat a healthy diet. If this is not enough to manage your cholesterol, you may need to take medicines. Take medicines as told by your doctor. High blood pressure. Try to keep your blood pressure below 130/80. If your blood pressure cannot be managed through a healthy diet and regular exercise, you may need to take medicines. Take medicines as told by your doctor. Ask your doctor if you should check your blood pressure at home. Have your blood pressure checked every year. Diabetes. Eat a healthy diet and get regular exercise. If your blood sugar (glucose) cannot be managed through diet and exercise, you may need to take medicines. Take medicines as told by your doctor. Talk to your doctor about getting checked for sleeping problems. Signs of a problem can include: Snoring a lot. Feeling very tired. Make sure that you manage any other conditions you have. Nutrition  Follow instructions from your doctor about what to eat or drink. You may be told to: Eat and drink fewer calories each day. Limit how much salt (sodium) you use to 1,500 milligrams (mg) each day. Use only healthy fats for cooking, such as olive oil, canola oil, and sunflower oil. Eat healthy foods. To do this: Choose foods that are high in fiber. These include whole grains, and fresh fruits and vegetables. Eat at least 5 servings of fruits and vegetables a day. Try to fill one-half of your plate with fruits and vegetables at each meal. Choose low-fat (lean) proteins. These include low-fat cuts of meat, chicken without  skin, fish, tofu, beans, and nuts. Eat low-fat dairy products. Avoid foods that: Are high in salt. Have saturated fat. Have trans fat. Have cholesterol. Are processed or pre-made. Count how many carbohydrates you eat and drink each day.  Lifestyle If you drink alcohol: Limit  how much you have to: 0-1 drink a day for women who are not pregnant. 0-2 drinks a day for men. Know how much alcohol is in your drink. In the [place] one drink equals one 12 oz bottle of beer (360mL), one 5 oz glass of wine (152mL), or one 1 oz glass of hard liquor (81mL). Do not smoke or use any products that have nicotine or tobacco. If you need help quitting, ask your doctor. Avoid secondhand smoke. Do not use drugs. Activity  Try to stay at a healthy weight. Get at least 30 minutes of exercise on most days, such as: Fast walking. Biking. Swimming.  Medicines Take over-the-counter and prescription medicines only as told by your doctor. Avoid taking birth control pills. Talk to your doctor about the risks of taking birth control pills if: You are over 24 years old. You smoke. You get very bad headaches. You have had a blood clot. Where to find more information American Stroke Association: www.strokeassociation.org Get help right away if: You or a loved one has any signs of a stroke. "BE FAST" is an easy way to remember the warning signs: B - Balance. Dizziness, sudden trouble walking, or loss of balance. E - Eyes. Trouble seeing or a change in how you see. F - Face. Sudden weakness or loss of feeling of the face. The face or eyelid may droop on one side. A - Arms. Weakness or loss of feeling in an arm. This happens all of a sudden and most often on one side of the body. S - Speech. Sudden trouble speaking, slurred speech, or trouble understanding what people say. T - Time. Time to call emergency services. Write down what time symptoms started. You or a loved one has other signs of a stroke, such as: A sudden, very bad headache with no known cause. Feeling like you may vomit (nausea). Vomiting. A seizure. These symptoms may be an emergency. Get help right away. Call your local emergency services (911 in the [place] Do not wait to see if the symptoms will go away. Do not drive  yourself to the hospital. Summary You can help to prevent a stroke by eating healthy, exercising, and not smoking. It also helps to manage any health problems you have. Do not smoke or use any products that contain nicotine or tobacco. Get help right away if you or a loved one has any signs of a stroke. This information is not intended to replace advice given to you by your health care provider. Make sure you discuss any questions you have with your healthcare provider. Document Revised: 06/02/2020 Document Reviewed: 06/02/2020 Elsevier Patient Education  [place].

## 2021-05-26 NOTE — Progress Notes (Signed)
I agree with the above plan 

## 2021-06-02 DIAGNOSIS — N183 Chronic kidney disease, stage 3 unspecified: Secondary | ICD-10-CM | POA: Diagnosis not present

## 2021-06-02 DIAGNOSIS — N398 Other specified disorders of urinary system: Secondary | ICD-10-CM | POA: Diagnosis not present

## 2021-06-10 DIAGNOSIS — N183 Chronic kidney disease, stage 3 unspecified: Secondary | ICD-10-CM | POA: Diagnosis not present

## 2021-06-10 DIAGNOSIS — I129 Hypertensive chronic kidney disease with stage 1 through stage 4 chronic kidney disease, or unspecified chronic kidney disease: Secondary | ICD-10-CM | POA: Diagnosis not present

## 2021-06-10 DIAGNOSIS — N2581 Secondary hyperparathyroidism of renal origin: Secondary | ICD-10-CM | POA: Diagnosis not present

## 2021-06-10 DIAGNOSIS — D631 Anemia in chronic kidney disease: Secondary | ICD-10-CM | POA: Diagnosis not present

## 2021-06-16 ENCOUNTER — Other Ambulatory Visit: Payer: Self-pay

## 2021-06-16 ENCOUNTER — Ambulatory Visit (INDEPENDENT_AMBULATORY_CARE_PROVIDER_SITE_OTHER): Payer: Medicare HMO | Admitting: Otolaryngology

## 2021-06-16 DIAGNOSIS — R198 Other specified symptoms and signs involving the digestive system and abdomen: Secondary | ICD-10-CM | POA: Diagnosis not present

## 2021-06-16 DIAGNOSIS — K219 Gastro-esophageal reflux disease without esophagitis: Secondary | ICD-10-CM

## 2021-06-16 DIAGNOSIS — R0989 Other specified symptoms and signs involving the circulatory and respiratory systems: Secondary | ICD-10-CM

## 2021-06-16 NOTE — Progress Notes (Signed)
HPI: [name] [name] is a 80 y.o. male who presents is referred by her PCP for evaluation of throat complaints..  He states that he has had chronic throat irritation since February.  He feels like the mucus is in the throat or a lump is in his throat and points to the area of the larynx.  He does not have trouble swallowing or eating food.  He has no hoarseness.  He quit smoking a number of years ago. Denies any sore throat today.  Past Medical History:  Diagnosis Date   Acute ischemic stroke ([place]) 09/29/2020   Acute myocardial infarction of other inferior wall, episode of care unspecified 03/02/2007   Arthritis    shoulders and ribs    Atrial fibrillation ([place])    atrial fib/ LOV S Weaver PA 04/11/12 EPIC, - CHEST X RAY, EKG 5/13 EPIC   CAD (coronary artery disease)     s/p NSTEMI 04/08;  LHC 02/2007: Proximal LAD 75%, mid LAD 99%, proximal RCA 25%.  PCI:  Cypher DES to the proximal and mid LAD.  Last nuclear study 11/2011 (after a trip to the ED with CP): EF 58%, low risk study with small inferior wall infarct at the mid and basal level, no ischemia.  Last echo 01/2010: Mild LVH, EF 55-60%, mild AI, mild MR, severely dilated LA and RA   Closed fracture of rib of left side with delayed healing 07/25/2019   Contrast dye induced nephropathy    Hx ARF secondary to contrast nephropathy   Dyslipidemia    Dysrhythmia    Elevated blood-pressure reading, without diagnosis of hypertension 11/07/2019   Epididymitis    Gallstones 05/30/2019   History of blood transfusion    Myocardial infarction ([place]) 2008   Pleurisy    2012   Pneumonia    hx of    Renal cyst    Left; 20 cm   Spinal stenosis    Urothelial cancer ([place])    Dr. [name],  skin cancer non melanoma   [place]edge compression fracture of third lumbar vertebra with routine healing 05/30/2019   Past Surgical History:  Procedure Laterality Date   arm surgery     orif right elbow   CARDIAC CATHETERIZATION  2008   coronary stents      CYSTECTOMY   07/15/08   CYSTOSCOPY WITH BIOPSY  03/23/2012   Procedure: CYSTOSCOPY WITH BIOPSY;  Surgeon: [name], MD;  Location: [facility];  Service: Urology;  Laterality: Right;   BRUSH BIOPSY RIGHT URETERAL STENT   CYSTOSCOPY WITH URETEROSCOPY  03/23/2012   Procedure: CYSTOSCOPY WITH URETEROSCOPY;  Surgeon: [name], MD;  Location: [facility];  Service: Urology;  Laterality: Right;    **OR Room #8 requested**  C-ARM    CYSTOSCOPY/RETROGRADE/URETEROSCOPY  02/15/2012   Procedure: CYSTOSCOPY/RETROGRADE/URETEROSCOPY;  Surgeon: [name], MD;  Location: [facility];  Service: Urology;  Laterality: Right;  C-ARM   IR THORACENTESIS ASP PLEURAL SPACE W/IMG GUIDE  12/26/2020   JOINT REPLACEMENT     Dr. 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   Procedure: LEFT TOTAL KNEE ARTHROPLASTY;  Surgeon: [name], MD;  Location: [facility];  Service: Orthopedics;  Laterality: Left;  70 mins with abductor block   TOTAL KNEE ARTHROPLASTY Right 04/03/2019   Procedure: RIGHT TOTAL KNEE ARTHROPLASTY;  Surgeon: [name], MD;  Location: [facility];  Service: Orthopedics;  Laterality: Right;  70 mins   URETER SURGERY     Social History   Socioeconomic History   Marital status: Married    Spouse name: jackie   Number of children: 3   Years of education: 12   Highest education level: Not on file  Occupational History   Occupation: Retired  Tobacco Use   Smoking status: Former    Packs/day: 2.00    Years: 10.00    Pack years: 20.00    Types: Cigarettes, Cigars    Quit date: 11/15/1981    Years since quitting: 39.6   Smokeless tobacco: Never  Vaping Use   Vaping Use: Never used  Substance and Sexual Activity   Alcohol use: Not Currently    Comment: SOCIAL   Drug use: No   Sexual activity: Not Currently  Other Topics Concern   Not on file  Social History Narrative   Lives in [place], [place] with wife. Exercising  3-4x/week.    Social Determinants of Health   Financial Resource Strain: Not on file  Food Insecurity: Not on file  Transportation Needs: Not on file  Physical Activity: Not on file  Stress: Not on file  Social Connections: Not on file   Family History  Problem Relation Age of Onset   Cancer Mother    Diabetes type II Mother    Hyperlipidemia Mother    Coronary artery disease Father    Heart attack Father    Allergies  Allergen Reactions   Contrast Media [Iodinated Diagnostic Agents]     Contrast-induced nephropathy requiring dialysis in 02/2007.   Iodine-131 Other (See Comments)    Kidneys shut down   Other Other (See Comments)    Beta blockers cause bradycardia   Promethazine Other (See Comments)    Pt became drowsy and mild altered mental status   Prior to Admission medications   Medication Sig Start Date End Date Taking? Authorizing Provider  acetaminophen (TYLENOL) 650 MG CR tablet Take 650 mg by mouth every 8 (eight) hours as needed for pain.    [provider]  apixaban (ELIQUIS) 5 MG TABS tablet Take 1 tablet (5 mg total) by mouth 2 (two) times daily. 04/16/21   Lindell Spar, MD  atorvastatin (LIPITOR) 40 MG tablet Take 1 tablet (40 mg total) by mouth every evening. 01/09/21   Lindell Spar, MD  benzonatate (TESSALON) 100 MG capsule Take 1 capsule (100 mg total) by mouth 2 (two) times daily as needed for cough. 04/14/21   Lindell Spar, MD  calcitRIOL (ROCALTROL) 0.5 MCG capsule Take 0.5 mcg by mouth daily.    [provider]  furosemide (LASIX) 40 MG tablet TAKE 1 TABLET EVERY DAY 10/29/20   Medora, [name], MD  niacin (NIASPAN) 500 MG CR tablet Take 1 tablet (500 mg total) by mouth at bedtime. 04/11/20   Dailey, [name], MD  nitroGLYCERIN (NITROSTAT) 0.4 MG SL tablet Place 1 tablet (0.4 mg total) under the tongue every 5 (five) minutes as needed for chest pain. 10/21/20   Richardson Dopp T, PA-C  Omega-3 Fatty Acids (FISH OIL) 1200 MG CAPS Take  1,200 mg by mouth daily.     [provider]  terbinafine (LAMISIL) 250 MG tablet Take 1 tablet (250 mg total) by mouth daily. 05/07/21   Lindell Spar, MD  traZODone (DESYREL) 50 MG tablet Take 1.5 tablets (75 mg total) by mouth at bedtime as needed for sleep. 03/27/21  Lindell Spar, MD  vitamin B-12 (CYANOCOBALAMIN) 500 MCG tablet Take 500 mcg by mouth daily.    [provider]     Positive ROS: Otherwise negative  All other systems have been reviewed and were otherwise negative with the exception of those mentioned in the HPI and as above.  Physical Exam: Constitutional: Alert, well-appearing, no acute distress.  No hoarseness. Ears: External ears without lesions or tenderness. Ear canals are clear bilaterally with intact, clear TMs.  Nasal: External nose without lesions. Septum with slight deformity and mild rhinitis.  But nasal passages are clear otherwise with no polyps and no mucopurulent discharge noted..  Oral: Lips and gums without lesions. Tongue and palate mucosa without lesions. Posterior oropharynx clear.  Oropharyngeal mucosa is normal to examination.  Indirect laryngoscopy revealed a clear base of tongue vallecula and epiglottis.  Both piriform sinuses were clear.  He had mild arytenoid edema but no mucosal lesions noted.  Vocal cords are clear with normal vocal mobility.  Neck: No palpable adenopathy or masses.  There is no palpable adenopathy along the jugular chain lymph nodes and no significant palpable thyroid nodules or masses noted. Respiratory: Breathing comfortably  Skin: No facial/neck lesions or rash noted.  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  Plan: Reviewed with him concerning GE reflux disease and treatment options of using Prilosec OTC 1 tablet before dinner for 4 weeks as this should help improve his  symptoms.  Also discussed with him concerning elevating the head of the bed as this also helps reduce reflux. Reassured him of no evidence of neoplasm or anatomical abnormality and no signs of infection.   Radene Journey, MD   CC:

## 2021-07-30 ENCOUNTER — Other Ambulatory Visit: Payer: Self-pay

## 2021-07-30 ENCOUNTER — Ambulatory Visit: Payer: Medicare HMO | Admitting: Internal Medicine

## 2021-07-30 ENCOUNTER — Encounter: Payer: Self-pay | Admitting: Internal Medicine

## 2021-07-30 ENCOUNTER — Ambulatory Visit (INDEPENDENT_AMBULATORY_CARE_PROVIDER_SITE_OTHER): Payer: Medicare HMO

## 2021-07-30 VITALS — BP 120/74 | HR 63 | Temp 97.7°F | Ht 72.0 in | Wt 180.2 lb

## 2021-07-30 DIAGNOSIS — J90 Pleural effusion, not elsewhere classified: Secondary | ICD-10-CM | POA: Diagnosis not present

## 2021-07-30 NOTE — Addendum Note (Signed)
Addended by: Suzzanne Cloud E on: 07/30/2021 04:21 PM   Modules accepted: Orders

## 2021-07-30 NOTE — Patient Instructions (Addendum)
ICD-10-CM   1. Pleural effusion, left  J90    Pleural effusion based on thoracentesis 12/26/2020 was nondiagnostic with lymphocytes Rpeat CXR 07/30/2021  Still with left side fluids    Plan -recommend repeat thoracentensis with flow cytometry by IR   - The fluid removal will be done by Interventional radiology   - they will remove not more than 1.5L  - fluid labs to be sent: cell count, gram stain and culture, cytology for malignant cells, chemistries for LDH, albumin,  Protein, glucose, triglyceride and lipase  - also send fluid for  flow cytometry   - blood labs to be sent 07/30/2021 : cbc, ldh, protein, albumin glucose, and lipase   Follow-up --Return to see Dr. Chase Caller or nurse practitioner in 4 weeks to discuss results from thoracentesis  - cxr on day of visit -Return sooner if needed - after this can discuss you establishing care at [facility] .

## 2021-07-30 NOTE — Addendum Note (Signed)
Addended by: Lorretta Harp on: 07/30/2021 04:34 PM   Modules accepted: Orders
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
## 2021-07-30 NOTE — Addendum Note (Signed)
Addended by: Suzzanne Cloud E on: 07/30/2021 04:34 PM   Modules accepted: Orders

## 2021-07-30 NOTE — Addendum Note (Signed)
Addended by: Lorretta Harp on: 07/30/2021 04:26 PM   Modules accepted: Orders

## 2021-07-31 ENCOUNTER — Other Ambulatory Visit: Payer: Medicare HMO

## 2021-07-31 ENCOUNTER — Telehealth: Payer: Self-pay | Admitting: Internal Medicine

## 2021-07-31 DIAGNOSIS — J90 Pleural effusion, not elsewhere classified: Secondary | ICD-10-CM

## 2021-07-31 LAB — LIPASE: Lipase: 54 U/L (ref 11.0–59.0)

## 2021-07-31 LAB — CBC WITH DIFFERENTIAL/PLATELET
Absolute Monocytes: 775 cells/uL (ref 200–950)
Basophils Absolute: 40 cells/uL (ref 0–200)
Basophils Relative: 0.7 %
Eosinophils Absolute: 108 cells/uL (ref 15–500)
Eosinophils Relative: 1.9 %
HCT: 37.4 % — ABNORMAL LOW (ref 38.5–50.0)
Hemoglobin: 11.8 g/dL — ABNORMAL LOW (ref 13.2–17.1)
Lymphs Abs: 542 cells/uL — ABNORMAL LOW (ref 850–3900)
MCH: 28.9 pg (ref 27.0–33.0)
MCHC: 31.6 g/dL — ABNORMAL LOW (ref 32.0–36.0)
MCV: 91.7 fL (ref 80.0–100.0)
MPV: 11.5 fL (ref 7.5–12.5)
Monocytes Relative: 13.6 %
Neutro Abs: 4235 cells/uL (ref 1500–7800)
Neutrophils Relative %: 74.3 %
Platelets: 266 10*3/uL (ref 140–400)
RBC: 4.08 10*6/uL — ABNORMAL LOW (ref 4.20–5.80)
RDW: 14.3 % (ref 11.0–15.0)
Total Lymphocyte: 9.5 %
WBC: 5.7 10*3/uL (ref 3.8–10.8)

## 2021-07-31 LAB — ALBUMIN: Albumin: 3.6 g/dL (ref 3.5–5.2)

## 2021-07-31 LAB — GLUCOSE, RANDOM: Glucose, Bld: 89 mg/dL (ref 70–99)

## 2021-07-31 LAB — LACTATE DEHYDROGENASE: LDH: 135 U/L (ref 120–250)

## 2021-07-31 LAB — PROTEIN, TOTAL: Total Protein: 7 g/dL (ref 6.0–8.3)

## 2021-07-31 NOTE — Telephone Encounter (Signed)
Last dose of eliquis Saturday morning. Then thora Monday -> resume eliquis Tuesday evening

## 2021-07-31 NOTE — Telephone Encounter (Signed)
Called and spoke with both pt and spouse [name] [name] letting them know that tomorrow morning, Saturday 9/17 is when pt is to take last dose of the Eliquis and that he can resume taking it Tuesday evening 9/20. Both verbalized understanding. Nothing further needed.

## 2021-07-31 NOTE — Addendum Note (Signed)
Addended by: Suzzanne Cloud E on: 07/31/2021 10:06 AM   Modules accepted: Orders

## 2021-07-31 NOTE — Telephone Encounter (Signed)
Dr. Ramaswamy please advise 

## 2021-08-03 ENCOUNTER — Encounter: Payer: Self-pay | Admitting: Physician Assistant

## 2021-08-03 ENCOUNTER — Other Ambulatory Visit (HOSPITAL_COMMUNITY): Payer: Self-pay | Admitting: Student

## 2021-08-03 ENCOUNTER — Ambulatory Visit (HOSPITAL_COMMUNITY)
Admission: RE | Admit: 2021-08-03 | Discharge: 2021-08-03 | Disposition: A | Discharge status: Home / Self Care | Payer: Medicare HMO | Source: Ambulatory Visit | Attending: Internal Medicine | Admitting: Internal Medicine

## 2021-08-03 ENCOUNTER — Ambulatory Visit (HOSPITAL_COMMUNITY)
Admission: RE | Admit: 2021-08-03 | Discharge: 2021-08-03 | Disposition: A | Discharge status: Home / Self Care | Payer: Medicare HMO | Source: Ambulatory Visit | Attending: Student | Admitting: Student

## 2021-08-03 ENCOUNTER — Other Ambulatory Visit: Payer: Self-pay

## 2021-08-03 DIAGNOSIS — J90 Pleural effusion, not elsewhere classified: Secondary | ICD-10-CM

## 2021-08-03 DIAGNOSIS — J948 Other specified pleural conditions: Secondary | ICD-10-CM | POA: Diagnosis not present

## 2021-08-03 HISTORY — PX: IR THORACENTESIS ASP PLEURAL SPACE W/IMG GUIDE: IMG5380

## 2021-08-03 LAB — BODY FLUID CELL COUNT WITH DIFFERENTIAL
Eos, Fluid: 0 %
Lymphs, Fluid: 97 %
Monocyte-Macrophage-Serous Fluid: 2 % — ABNORMAL LOW (ref 50–90)
Neutrophil Count, Fluid: 1 % (ref 0–25)
Total Nucleated Cell Count, Fluid: 65 cu mm (ref 0–1000)

## 2021-08-03 LAB — GRAM STAIN

## 2021-08-03 LAB — ALBUMIN, PLEURAL OR PERITONEAL FLUID: Albumin, Fluid: 1 g/dL

## 2021-08-03 LAB — GLUCOSE, PLEURAL OR PERITONEAL FLUID: Glucose, Fluid: 60 mg/dL

## 2021-08-03 LAB — LACTATE DEHYDROGENASE, PLEURAL OR PERITONEAL FLUID: LD, Fluid: 68 U/L — ABNORMAL HIGH (ref 3–23)

## 2021-08-03 LAB — PROTEIN, PLEURAL OR PERITONEAL FLUID: Total protein, fluid: <3 g/dL

## 2021-08-03 MED ORDER — LIDOCAINE HCL 1 % IJ SOLN
INTRAMUSCULAR | Status: AC
  Administered 2021-08-03: 10 mL via INTRADERMAL
  Filled 2021-08-03: qty 20

## 2021-08-03 NOTE — Procedures (Addendum)
PROCEDURE SUMMARY:  Successful US guided diagnostic and therapeutic thoracentesis. Yielded 300 of yellow fluid. Pt tolerated procedure well. Nausea occurred after case, but subsided.  Specimen was sent for labs. CXR ordered, results pending  EBL: negligible  Wretha Laris PA-C 08/03/2021 2:09 PM

## 2021-08-04 ENCOUNTER — Telehealth: Payer: Self-pay | Admitting: Internal Medicine

## 2021-08-04 LAB — TRIGLYCERIDES, BODY FLUIDS: Triglycerides, Fluid: <9 mg/dL

## 2021-08-04 LAB — CYTOLOGY - NON PAP

## 2021-08-04 NOTE — Telephone Encounter (Signed)
[name] [name]   Pls call lab and see if sample can be sent for flow cytometry   THanks    SIGNATURE    Dr. Brand Males, M.D., F.C.C.P,  Pulmonary and Critical Care Medicine Staff Physician, [place] Director - Interstitial Lung Disease  Program  Pulmonary [place] at [place], [place], [address]  NPI Number:  NPI #[id number]  Pager: [phone], If no answer  -> Check AMION or Try [phone] Telephone (clinical office): [phone] Telephone (research): [phone]  6:09 PM 08/04/2021

## 2021-08-05 NOTE — Telephone Encounter (Signed)
[facility] and was transferred to pathology about pt's thora. Due to the test that is being requested, cytology dept will need to be the area called as they are the ones that do the flow cytometry but they close at 4pm.  Phone number for cytology dept is: [phone]. Will call tomorrow 9/22.

## 2021-08-06 NOTE — Telephone Encounter (Signed)
Called [facility] and spoke with Tammy about having flow cytometry done from pt's thora. Tammy stated that it should be able to be done but if she got word that it could not be done that she would call the office.

## 2021-08-07 LAB — SURGICAL PATHOLOGY

## 2021-08-10 LAB — CULTURE, BODY FLUID W GRAM STAIN -BOTTLE: Culture: NO GROWTH

## 2021-08-24 DIAGNOSIS — M545 Low back pain, unspecified: Secondary | ICD-10-CM | POA: Diagnosis not present

## 2021-08-25 ENCOUNTER — Ambulatory Visit (INDEPENDENT_AMBULATORY_CARE_PROVIDER_SITE_OTHER): Payer: Medicare HMO

## 2021-08-25 ENCOUNTER — Other Ambulatory Visit: Payer: Self-pay

## 2021-08-25 DIAGNOSIS — Z23 Encounter for immunization: Secondary | ICD-10-CM

## 2021-08-27 ENCOUNTER — Ambulatory Visit (INDEPENDENT_AMBULATORY_CARE_PROVIDER_SITE_OTHER): Payer: Medicare HMO | Admitting: Internal Medicine

## 2021-08-27 ENCOUNTER — Other Ambulatory Visit: Payer: Self-pay

## 2021-08-27 ENCOUNTER — Encounter: Payer: Self-pay | Admitting: Internal Medicine

## 2021-08-27 VITALS — BP 126/70 | HR 71 | Temp 97.7°F | Ht 72.0 in | Wt 181.2 lb

## 2021-08-27 DIAGNOSIS — J90 Pleural effusion, not elsewhere classified: Secondary | ICD-10-CM
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
## 2021-08-27 NOTE — Patient Instructions (Addendum)
ICD-10-CM   1. Pleural effusion, left  J90      Glad you are better after the second thoracentesis in mid September 2022 Clinically I think there are still some residual pleural effusion The reason for this effusion is not known other than it is what we call a "leaky" fluid with lymphocytes  Plan - We discussed several options but took a shared decision making to use a wait and watch approach with establishing care at pulmonary in [place] a chest x-ray two-view in 1 month - See one of her pulmonologist in [place] in 1 month  -Showed a video on Pleurx catheter  -take 16 page brochure if it will print - Hold off surgical referral  - Next thora might need flow cytometry analysis of lymphocytes  Followup  - 1 month in [place] [facility] with cxr

## 2021-09-03 DIAGNOSIS — D1801 Hemangioma of skin and subcutaneous tissue: Secondary | ICD-10-CM | POA: Diagnosis not present

## 2021-09-03 DIAGNOSIS — L812 Freckles: Secondary | ICD-10-CM | POA: Diagnosis not present

## 2021-09-03 DIAGNOSIS — D225 Melanocytic nevi of trunk: Secondary | ICD-10-CM | POA: Diagnosis not present

## 2021-09-03 DIAGNOSIS — Z85828 Personal history of other malignant neoplasm of skin: Secondary | ICD-10-CM | POA: Diagnosis not present

## 2021-09-03 DIAGNOSIS — L821 Other seborrheic keratosis: Secondary | ICD-10-CM | POA: Diagnosis not present

## 2021-09-03 DIAGNOSIS — L720 Epidermal cyst: Secondary | ICD-10-CM | POA: Diagnosis not present

## 2021-09-14 ENCOUNTER — Ambulatory Visit: Payer: Medicare HMO | Admitting: Cardiovascular Disease

## 2021-09-14 ENCOUNTER — Other Ambulatory Visit: Payer: Self-pay

## 2021-09-14 ENCOUNTER — Encounter: Payer: Self-pay | Admitting: Cardiovascular Disease

## 2021-09-14 VITALS — BP 120/80 | HR 72 | Ht 72.0 in | Wt 180.8 lb

## 2021-09-14 DIAGNOSIS — I2510 Atherosclerotic heart disease of native coronary artery without angina pectoris: Secondary | ICD-10-CM

## 2021-09-14 DIAGNOSIS — I4821 Permanent atrial fibrillation: Secondary | ICD-10-CM | POA: Diagnosis not present

## 2021-09-14 DIAGNOSIS — R931 Abnormal findings on diagnostic imaging of heart and coronary circulation: Secondary | ICD-10-CM

## 2021-09-14 DIAGNOSIS — N1831 Chronic kidney disease, stage 3a: Secondary | ICD-10-CM | POA: Diagnosis not present

## 2021-09-14 DIAGNOSIS — E782 Mixed hyperlipidemia: Secondary | ICD-10-CM | POA: Diagnosis not present

## 2021-09-14 NOTE — Patient Instructions (Signed)
Medication Instructions:  *If you need a refill on your cardiac medications before your next appointment, please call your pharmacy*  Lab Work: If you have labs (blood work) drawn today and your tests are completely normal, you will receive your results only by: [place] (if you have MyChart) OR A paper copy in the mail If you have any lab test that is abnormal or we need to change your treatment, we will call you to review the results.  Testing/Procedures: None ordered today.  Follow-Up: At [facility], you and your health needs are our priority.  As part of our continuing mission to provide you with exceptional heart care, we have created designated Provider Care Teams.  These Care Teams include your primary Cardiologist (physician) and Advanced Practice Providers (APPs -  Physician Assistants and Nurse Practitioners) who all work together to provide you with the care you need, when you need it.  We recommend signing up for the patient portal called "MyChart".  Sign up information is provided on this After Visit Summary.  MyChart is used to connect with patients for Virtual Visits (Telemedicine).  Patients are able to view lab/test results, encounter notes, upcoming appointments, etc.  Non-urgent messages can be sent to your provider as well.   To learn more about what you can do with MyChart, go to [email or web address].    Your next appointment:   12 month(s)  The format for your next appointment:   In Person  Provider:   You may see Sherren Mocha, MD or one of the following Advanced Practice Providers on your designated Care Team:   Richardson Dopp, PA-C Robbie Lis, [place]   Other Instructions

## 2021-09-14 NOTE — Progress Notes (Signed)
Cardiology Office Note:    Date:  09/14/2021   ID:  [name] [name], DOB [birth year], MRN [id number]  PCP:  Lindell Spar, MD   [facility] Providers Cardiologist:  Sherren Mocha, MD Cardiology APP:  Sharmon Revere     Referring MD: Lindell Spar, MD   Chief Complaint  Patient presents with   Coronary Artery Disease    History of Present Illness:    [name] [name] is a 80 y.o. 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  He is doing well from a cardiac perspective.  He is having a lot of problems with his back and hip.  He is scheduled for an MRI in the near future.  He is pretty limited right now as to what he can do.  His wife is here today because he is not able to operate the gas pedal.  He has not been able to go to the gym for a few months because of his back problems.  He denies chest pain, chest pressure, or shortness of breath.  No heart palpitations, leg swelling, orthopnea, PND, lightheadedness, or syncope.  He is compliant with his medications.  The patient has some residual speech problems related to his stroke 1 year ago.  He has recently been managed for a recurrent left pleural effusion.  There has been discussion about a Pleurx catheter but he has declined this.  He has had 2 thoracenteses today and has had improved symptoms after each 1. Past Medical History:  Diagnosis Date   Acute ischemic stroke ([place]) 09/29/2020   Acute myocardial infarction of other inferior wall, episode of care unspecified 03/02/2007   Arthritis    shoulders and ribs    Atrial fibrillation ([place])    atrial fib/ LOV S  [place] 04/11/12 EPIC, - CHEST X RAY, EKG 5/13 EPIC   CAD (coronary artery disease)     s/p NSTEMI 04/08;  LHC 02/2007: Proximal LAD 75%, mid LAD 99%, proximal RCA 25%.  PCI:  Cypher DES to the proximal and mid LAD.  Last nuclear study 11/2011 (after a trip to the ED with CP): EF 58%, low risk study with small inferior wall infarct at the mid and basal level, no ischemia.  Last echo 01/2010: Mild LVH, EF 55-60%, mild AI, mild MR, severely dilated LA and RA   Closed fracture of rib of left side with delayed healing 07/25/2019   Contrast dye induced nephropathy    Hx ARF secondary to contrast nephropathy   Dyslipidemia    Dysrhythmia    Elevated blood-pressure reading, without diagnosis of hypertension 11/07/2019   Epididymitis    Gallstones 05/30/2019   History of blood transfusion    Myocardial infarction [facility]) 2008   Pleurisy    2012   Pneumonia    hx of    Renal cyst    Left; 20 cm   Spinal stenosis    Urothelial cancer ([place])    Dr. [name],  skin cancer non melanoma   Wedge compression fracture of third lumbar vertebra with routine healing 05/30/2019    Past Surgical  History:  Procedure Laterality Date   arm surgery     orif right elbow   CARDIAC CATHETERIZATION  2008   coronary stents      CYSTECTOMY  07/15/08   CYSTOSCOPY WITH BIOPSY  03/23/2012   Procedure: CYSTOSCOPY WITH BIOPSY;  Surgeon: [name], MD;  Location: [facility];  Service: Urology;  Laterality: Right;   BRUSH BIOPSY RIGHT URETERAL STENT   CYSTOSCOPY WITH URETEROSCOPY  03/23/2012   Procedure: CYSTOSCOPY WITH URETEROSCOPY;  Surgeon: [name], MD;  Location: [facility];  Service: Urology;  Laterality: Right;    **OR Room #8 requested**  C-ARM    CYSTOSCOPY/RETROGRADE/URETEROSCOPY  02/15/2012   Procedure: CYSTOSCOPY/RETROGRADE/URETEROSCOPY;  Surgeon: [name], MD;  Location: [facility];  Service: Urology;  Laterality: Right;  C-ARM   IR THORACENTESIS ASP PLEURAL SPACE W/IMG GUIDE  12/26/2020   IR THORACENTESIS ASP  PLEURAL SPACE W/IMG GUIDE  08/03/2021   JOINT REPLACEMENT     Dr. 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   Procedure: LEFT TOTAL KNEE ARTHROPLASTY;  Surgeon: [name], MD;  Location: [facility];  Service: Orthopedics;  Laterality: Left;  70 mins with abductor block   TOTAL KNEE ARTHROPLASTY Right 04/03/2019   Procedure: RIGHT TOTAL KNEE ARTHROPLASTY;  Surgeon: [name], MD;  Location: [facility];  Service: Orthopedics;  Laterality: Right;  70 mins   URETER SURGERY      Current Medications: Current Meds  Medication Sig   acetaminophen (TYLENOL) 650 MG CR tablet Take 650 mg by mouth every 8 (eight) hours as needed for pain.   apixaban (ELIQUIS) 5 MG TABS tablet Take 1 tablet (5 mg total) by mouth 2 (two) times daily.   atorvastatin (LIPITOR) 40 MG tablet Take 1 tablet (40 mg total) by mouth every evening.   calcitRIOL (ROCALTROL) 0.5 MCG capsule Take 0.5 mcg by mouth daily.   furosemide (LASIX) 40 MG tablet TAKE 1 TABLET EVERY DAY   niacin (NIASPAN) 500 MG CR tablet Take 1 tablet (500 mg total) by mouth at bedtime.   nitroGLYCERIN (NITROSTAT) 0.4 MG SL tablet Place 1 tablet (0.4 mg total) under the tongue every 5 (five) minutes as needed for chest pain.   Omega-3 Fatty Acids (FISH OIL) 1200 MG CAPS Take 1,200 mg by mouth daily.    traZODone (DESYREL) 50 MG tablet Take 1.5 tablets (75 mg total) by mouth at bedtime as needed for sleep.   vitamin B-12 (CYANOCOBALAMIN) 500 MCG tablet Take 500 mcg by mouth daily.     Allergies:   Contrast media [iodinated diagnostic agents], Iodine-131, Other, and Promethazine   Social History   Socioeconomic History   Marital status: Married    Spouse name: jackie   Number of children: 3   Years of education: 12   Highest education level: Not on file  Occupational History   Occupation: Retired  Tobacco Use   Smoking status: Former    Packs/day:  2.00    Years: 10.00    Pack years: 20.00    Types: Cigarettes, Cigars    Quit date: 11/15/1981    Years since quitting: 39.8   Smokeless tobacco: Never  Vaping Use   Vaping Use: Never used  Substance and Sexual Activity   Alcohol use: Not Currently    Comment: SOCIAL   Drug use: No   Sexual activity:  Not Currently  Other Topics Concern   Not on file  Social History Narrative   Lives in [place], [place] with wife. Exercising 3-4x/week.    Social Determinants of Health   Financial Resource Strain: Not on file  Food Insecurity: Not on file  Transportation Needs: Not on file  Physical Activity: Not on file  Stress: Not on file  Social Connections: Not on file     Family History: The patient's family history includes Cancer in his mother; Coronary artery disease in his father; Diabetes type II in his mother; Heart attack in his father; Hyperlipidemia in his mother.  ROS:   Please see the history of present illness.    All other systems reviewed and are negative.  EKGs/Labs/Other Studies Reviewed:    The following studies were reviewed today: Echo 09/30/20: IMPRESSIONS     1. There is a small, independently mobile echodensity on the ventricular  aspect aortic valve posteriorly. Not well visualized in other echo views.  In setting of stroke, this may need to be further evaluated (clip 7).. The  aortic valve is tricuspid.  There is mild calcification of the aortic valve. Aortic valve  regurgitation is trivial. No aortic stenosis is present.   2. Left ventricular ejection fraction, by estimation, is 55%. The left  ventricle has normal function. The left ventricle has no regional wall  motion abnormalities. Left ventricular diastolic parameters are  indeterminate.   3. Right ventricular systolic function is mildly reduced. The right  ventricular size is moderately enlarged. There is mildly elevated  pulmonary artery systolic pressure. The estimated right ventricular   systolic pressure is 93.7 mmHg.   4. Left atrial size was moderately dilated.   5. Right atrial size was severely dilated.   6. The mitral valve is normal in structure. Mild mitral valve  regurgitation. No evidence of mitral stenosis.   7. There is mild dilatation of the ascending aorta, measuring 43 mm.   8. The inferior vena cava is dilated in size with <50% respiratory  variability, suggesting right atrial pressure of 15 mmHg.   EKG:  EKG is not ordered today.    Recent Labs: 09/29/2020: ALT 13 12/03/2020: TSH 5.13 01/21/2021: BUN 22; Creatinine, Ser 1.37; Potassium 4.5; Sodium 141 07/31/2021: Hemoglobin 11.8; Platelets 266  Recent Lipid Panel    Component Value Date/Time   CHOL 110 09/30/2020 0334   TRIG 54 09/30/2020 0334   HDL 30 (L) 09/30/2020 0334   CHOLHDL 3.7 09/30/2020 0334   VLDL 11 09/30/2020 0334   LDLCALC 69 09/30/2020 0334   LDLCALC 76 04/11/2020 0833     Risk Assessment/Calculations:    CHA2DS2-VASc Score = 5   This indicates a 7.2% annual risk of stroke. The patient's score is based upon: CHF History: 0 HTN History: 0 Diabetes History: 0 Stroke History: 2 Vascular Disease History: 1 Age Score: 2 Gender Score: 0          Physical Exam:    VS:  BP 120/80   Pulse 72   Ht 6' (1.829 m)   Wt 180 lb 12.8 oz (82 kg)   SpO2 96%   BMI 24.52 kg/m     Wt Readings from Last 3 Encounters:  09/14/21 180 lb 12.8 oz (82 kg)  08/27/21 181 lb 3.2 oz (82.2 kg)  07/30/21 180 lb 3.2 oz (81.7 kg)     GEN:  Well nourished, well developed in no acute distress HEENT: Normal NECK: No JVD; No carotid bruits LYMPHATICS:  No lymphadenopathy CARDIAC: irregularly irregular, no murmurs, rubs, gallops RESPIRATORY:  Clear to auscultation without rales, wheezing or rhonchi  ABDOMEN: Soft, non-tender, non-distended MUSCULOSKELETAL:  No edema; No deformity  SKIN: Warm and dry, chronic stasis dermatitis both lower extremities NEUROLOGIC:  Alert and oriented x  3 PSYCHIATRIC:  Normal affect   ASSESSMENT:    1. Permanent atrial fibrillation ([place])   2. Coronary artery disease involving native coronary artery of native heart without angina pectoris   3. Stage 3a chronic kidney disease ([place])   4. Mixed hyperlipidemia   5. Abnormal echocardiogram    PLAN:    In order of problems listed above:  Heart rate controlled.  Tolerating long-term oral anticoagulation with apixaban.  Most recent echo reviewed as above. No anginal symptoms noted.  No antiplatelet therapy in the setting of chronic oral anticoagulation.  He has been stable for many years in this regard. Most recent labs reviewed with stable renal function, remaining in stage IIIa range.  He is followed by nephrology. Treated with atorvastatin 40 mg daily.  Last LDL cholesterol 69 mg/dL.  Transaminases normal with an ALT of 13. The patient's echocardiogram last year at the time of his stroke was reviewed and discussed amongst some of the cardiologist.  There is a question of a mobile echodensity on the aortic valve.  No further imaging evaluation was recommended.  He remains on oral anticoagulation with no recurrence.  He has no aortic stenosis or insufficiency.  No further evaluation planned at this time.        Medication Adjustments/Labs and Tests Ordered: Current medicines are reviewed at length with the patient today.  Concerns regarding medicines are outlined above.  No orders of the defined types were placed in this encounter.  No orders of the defined types were placed in this encounter.   Patient Instructions  Medication Instructions:  *If you need a refill on your cardiac medications before your next appointment, please call your pharmacy*  Lab Work: If you have labs (blood work) drawn today and your tests are completely normal, you will receive your results only by: [place] (if you have MyChart) OR A paper copy in the mail If you have any lab test that is abnormal or  we need to change your treatment, we will call you to review the results.  Testing/Procedures: None ordered today.  Follow-Up: At [facility], you and your health needs are our priority.  As part of our continuing mission to provide you with exceptional heart care, we have created designated Provider Care Teams.  These Care Teams include your primary Cardiologist (physician) and Advanced Practice Providers (APPs -  Physician Assistants and Nurse Practitioners) who all work together to provide you with the care you need, when you need it.  We recommend signing up for the patient portal called "MyChart".  Sign up information is provided on this After Visit Summary.  MyChart is used to connect with patients for Virtual Visits (Telemedicine).  Patients are able to view lab/test results, encounter notes, upcoming appointments, etc.  Non-urgent messages can be sent to your provider as well.   To learn more about what you can do with MyChart, go to [email or web address].    Your next appointment:   12 month(s)  The format for your next appointment:   In Person  Provider:   You may see Sherren Mocha, MD or one of the following Advanced Practice Providers on your designated Care Team:   Richardson Dopp, PA-C [place],  PA-C   Other Instructions   Signed, Sherren Mocha, MD  09/14/2021 10:12 AM    [place]

## 2021-09-15 DIAGNOSIS — H5203 Hypermetropia, bilateral: Secondary | ICD-10-CM | POA: Diagnosis not present

## 2021-09-15 DIAGNOSIS — E113593 Type 2 diabetes mellitus with proliferative diabetic retinopathy without macular edema, bilateral: Secondary | ICD-10-CM | POA: Diagnosis not present

## 2021-09-21 DIAGNOSIS — M5451 Vertebrogenic low back pain: Secondary | ICD-10-CM | POA: Diagnosis not present

## 2021-09-24 DIAGNOSIS — M5451 Vertebrogenic low back pain: Secondary | ICD-10-CM | POA: Diagnosis not present

## 2021-10-13 ENCOUNTER — Ambulatory Visit: Payer: Medicare HMO | Admitting: Internal Medicine

## 2021-10-13 ENCOUNTER — Other Ambulatory Visit: Payer: Self-pay

## 2021-10-13 ENCOUNTER — Ambulatory Visit (HOSPITAL_COMMUNITY)
Admission: RE | Admit: 2021-10-13 | Discharge: 2021-10-13 | Disposition: A | Discharge status: Home / Self Care | Payer: Medicare HMO | Source: Ambulatory Visit | Attending: Internal Medicine | Admitting: Internal Medicine

## 2021-10-13 ENCOUNTER — Encounter: Payer: Self-pay | Admitting: Internal Medicine

## 2021-10-13 DIAGNOSIS — R0602 Shortness of breath: Secondary | ICD-10-CM | POA: Diagnosis not present

## 2021-10-13 DIAGNOSIS — J90 Pleural effusion, not elsewhere classified: Secondary | ICD-10-CM

## 2021-10-13 DIAGNOSIS — I517 Cardiomegaly: Secondary | ICD-10-CM | POA: Diagnosis not present

## 2021-10-13 NOTE — Patient Instructions (Addendum)
Please remember to go to the x-ray department at [facility]   for your tests - we will call you with the results when they are available and then decide whether further follow up is needed
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
## 2021-10-14 ENCOUNTER — Encounter: Payer: Self-pay | Admitting: Internal Medicine
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
## 2021-10-19 ENCOUNTER — Other Ambulatory Visit: Payer: Self-pay

## 2021-10-19 ENCOUNTER — Ambulatory Visit (INDEPENDENT_AMBULATORY_CARE_PROVIDER_SITE_OTHER): Payer: Medicare HMO

## 2021-10-19 DIAGNOSIS — Z0000 Encounter for general adult medical examination without abnormal findings: Secondary | ICD-10-CM

## 2021-10-19 NOTE — Patient Instructions (Addendum)
[name] [name] , Thank you for taking time to come for your Medicare Wellness Visit. I appreciate your ongoing commitment to your health goals. Please review the following plan we discussed and let me know if I can assist you in the future.   Discuss the pneumonia vaccine at your next visit with Dr Posey Pronto.  These are the goals we discussed:  Goals      Patient Stated     Continue going to the [facility] 3 days a week and staying as active as possible     Prevent falls        This is a list of the screening recommended for you and due dates:  Health Maintenance  Topic Date Due   Zoster (Shingles) Vaccine (1 of 2) Never done   Pneumonia Vaccine (2 - PPSV23 if available, else PCV20) 06/19/2016   COVID-19 Vaccine (4 - Booster for Moderna series) 01/21/2021   Tetanus Vaccine  03/14/2025   Flu Shot  Completed   HPV Vaccine  Aged Out     Health Maintenance, Male Adopting a healthy lifestyle and getting preventive care are important in promoting health and wellness. Ask your health care provider about: The right schedule for you to have regular tests and exams. Things you can do on your own to prevent diseases and keep yourself healthy. What should I know about diet, weight, and exercise? Eat a healthy diet  Eat a diet that includes plenty of vegetables, fruits, low-fat dairy products, and lean protein. Do not eat a lot of foods that are high in solid fats, added sugars, or sodium. Maintain a healthy weight Body mass index (BMI) is a measurement that can be used to identify possible weight problems. It estimates body fat based on height and weight. Your health care provider can help determine your BMI and help you achieve or maintain a healthy weight. Get regular exercise Get regular exercise. This is one of the most important things you can do for your health. Most adults should: Exercise for at least 150 minutes each week. The exercise should increase your heart rate and make you sweat  (moderate-intensity exercise). Do strengthening exercises at least twice a week. This is in addition to the moderate-intensity exercise. Spend less time sitting. Even light physical activity can be beneficial. Watch cholesterol and blood lipids Have your blood tested for lipids and cholesterol at 82 years of age, then have this test every 5 years. You may need to have your cholesterol levels checked more often if: Your lipid or cholesterol levels are high. You are older than 82 years of age. You are at high risk for heart disease. What should I know about cancer screening? Many types of cancers can be detected early and may often be prevented. Depending on your health history and family history, you may need to have cancer screening at various ages. This may include screening for: Colorectal cancer. Prostate cancer. Skin cancer. Lung cancer. What should I know about heart disease, diabetes, and high blood pressure? Blood pressure and heart disease High blood pressure causes heart disease and increases the risk of stroke. This is more likely to develop in people who have high blood pressure readings or are overweight. Talk with your health care provider about your target blood pressure readings. Have your blood pressure checked: Every 3-5 years if you are 39-61 years of age. Every year if you are 15 years old or older. If you are between the ages of 22 and 55 and are  a current or former smoker, ask your health care provider if you should have a one-time screening for abdominal aortic aneurysm (AAA). Diabetes Have regular diabetes screenings. This checks your fasting blood sugar level. Have the screening done: Once every three years after age 19 if you are at a normal weight and have a low risk for diabetes. More often and at a younger age if you are overweight or have a high risk for diabetes. What should I know about preventing infection? Hepatitis B If you have a higher risk for  hepatitis B, you should be screened for this virus. Talk with your health care provider to find out if you are at risk for hepatitis B infection. Hepatitis C Blood testing is recommended for: Everyone born from [birth year] through 1967. Anyone with known risk factors for hepatitis C. Sexually transmitted infections (STIs) You should be screened each year for STIs, including gonorrhea and chlamydia, if: You are sexually active and are younger than 82 years of age. You are older than 82 years of age and your health care provider tells you that you are at risk for this type of infection. Your sexual activity has changed since you were last screened, and you are at increased risk for chlamydia or gonorrhea. Ask your health care provider if you are at risk. Ask your health care provider about whether you are at high risk for HIV. Your health care provider may recommend a prescription medicine to help prevent HIV infection. If you choose to take medicine to prevent HIV, you should first get tested for HIV. You should then be tested every 3 months for as long as you are taking the medicine. Follow these instructions at home: Alcohol use Do not drink alcohol if your health care provider tells you not to drink. If you drink alcohol: Limit how much you have to 0-2 drinks a day. Know how much alcohol is in your drink. In the [place] one drink equals one 12 oz bottle of beer (355 mL), one 5 oz glass of wine (148 mL), or one 1 oz glass of hard liquor (44 mL). Lifestyle Do not use any products that contain nicotine or tobacco. These products include cigarettes, chewing tobacco, and vaping devices, such as e-cigarettes. If you need help quitting, ask your health care provider. Do not use street drugs. Do not share needles. Ask your health care provider for help if you need support or information about quitting drugs. General instructions Schedule regular health, dental, and eye exams. Stay current with your  vaccines. Tell your health care provider if: You often feel depressed. You have ever been abused or do not feel safe at home. Summary Adopting a healthy lifestyle and getting preventive care are important in promoting health and wellness. Follow your health care provider's instructions about healthy diet, exercising, and getting tested or screened for diseases. Follow your health care provider's instructions on monitoring your cholesterol and blood pressure. This information is not intended to replace advice given to you by your health care provider. Make sure you discuss any questions you have with your health care provider. Document Revised: 03/23/2021 Document Reviewed: 03/23/2021 Elsevier Patient Education  [place].

## 2021-10-19 NOTE — Progress Notes (Signed)
Subjective:   [name] [name] is a 80 y.o. male who presents for Medicare Annual/Subsequent preventive examination.  Review of Systems     Cardiac Risk Factors include: advanced age (>37men, >20 women);male gender;hypertension     Objective:    There were no vitals filed for this visit. There is no height or weight on file to calculate BMI.  Advanced Directives 10/19/2021 10/15/2020 09/29/2020 10/15/2019 07/24/2019 07/24/2019 05/27/2019  Does Patient Have a Medical Advance Directive? No Yes No No No No No  Type of Advance Directive - Out of facility DNR (pink MOST or yellow form) - - - - -  Does patient want to make changes to medical advance directive? - No - Patient declined - - - - -  Would patient like information on creating a medical advance directive? Yes (ED - Information included in AVS) No - Patient declined No - Patient declined Yes (MAU/Ambulatory/Procedural Areas - Information given) No - Patient declined - -  Pre-existing out of facility DNR order (yellow form or pink MOST form) - - - - - - -    Current Medications (verified) Outpatient Encounter Medications as of 10/19/2021  Medication Sig   acetaminophen (TYLENOL) 650 MG CR tablet Take 650 mg by mouth every 8 (eight) hours as needed for pain.   apixaban (ELIQUIS) 5 MG TABS tablet Take 1 tablet (5 mg total) by mouth 2 (two) times daily.   atorvastatin (LIPITOR) 40 MG tablet Take 1 tablet (40 mg total) by mouth every evening.   calcitRIOL (ROCALTROL) 0.5 MCG capsule Take 0.5 mcg by mouth daily.   furosemide (LASIX) 40 MG tablet TAKE 1 TABLET EVERY DAY   niacin (NIASPAN) 500 MG CR tablet Take 1 tablet (500 mg total) by mouth at bedtime.   nitroGLYCERIN (NITROSTAT) 0.4 MG SL tablet Place 1 tablet (0.4 mg total) under the tongue every 5 (five) minutes as needed for chest pain.   Omega-3 Fatty Acids (FISH OIL) 1200 MG CAPS Take 1,200 mg by mouth daily.    traZODone (DESYREL) 50 MG tablet Take 1.5 tablets (75 mg total) by mouth at  bedtime as needed for sleep.   vitamin B-12 (CYANOCOBALAMIN) 500 MCG tablet Take 500 mcg by mouth daily.   No facility-administered encounter medications on file as of 10/19/2021.    Allergies (verified) Contrast media [iodinated diagnostic agents], Iodine-131, Other, and Promethazine   History: Past Medical History:  Diagnosis Date   Acute ischemic stroke ([place]) 09/29/2020   Acute myocardial infarction of other inferior wall, episode of care unspecified 03/02/2007   Arthritis    shoulders and ribs    Atrial fibrillation ([place])    atrial fib/ LOV S Weaver PA 04/11/12 EPIC, - CHEST X RAY, EKG 5/13 EPIC   CAD (coronary artery disease)     s/p NSTEMI 04/08;  LHC 02/2007: Proximal LAD 75%, mid LAD 99%, proximal RCA 25%.  PCI:  Cypher DES to the proximal and mid LAD.  Last nuclear study 11/2011 (after a trip to the ED with CP): EF 58%, low risk study with small inferior wall infarct at the mid and basal level, no ischemia.  Last echo 01/2010: Mild LVH, EF 55-60%, mild AI, mild MR, severely dilated LA and RA   Closed fracture of rib of left side with delayed healing 07/25/2019   Contrast dye induced nephropathy    Hx ARF secondary to contrast nephropathy   Dyslipidemia    Dysrhythmia    Elevated blood-pressure reading, without diagnosis of hypertension 11/07/2019  Epididymitis    Gallstones 05/30/2019   History of blood transfusion    Myocardial infarction [facility]) 2008   Pleurisy    2012   Pneumonia    hx of    Renal cyst    Left; 20 cm   Spinal stenosis    Urothelial cancer ([place])    Dr. [name],  skin cancer non melanoma   [place]edge compression fracture of third lumbar vertebra with routine healing 05/30/2019   Past Surgical History:  Procedure Laterality Date   arm surgery     orif right elbow   CARDIAC CATHETERIZATION  2008   coronary stents      CYSTECTOMY  07/15/08   CYSTOSCOPY WITH BIOPSY  03/23/2012   Procedure: CYSTOSCOPY WITH BIOPSY;  Surgeon: [name], MD;  Location: [facility];   Service: Urology;  Laterality: Right;   BRUSH BIOPSY RIGHT URETERAL STENT   CYSTOSCOPY WITH URETEROSCOPY  03/23/2012   Procedure: CYSTOSCOPY WITH URETEROSCOPY;  Surgeon: [name], MD;  Location: [facility];  Service: Urology;  Laterality: Right;    **OR Room #8 requested**  C-ARM    CYSTOSCOPY/RETROGRADE/URETEROSCOPY  02/15/2012   Procedure: CYSTOSCOPY/RETROGRADE/URETEROSCOPY;  Surgeon: [name], MD;  Location: [facility];  Service: Urology;  Laterality: Right;  C-ARM   IR THORACENTESIS ASP PLEURAL SPACE W/IMG GUIDE  12/26/2020   IR THORACENTESIS ASP PLEURAL SPACE W/IMG GUIDE  08/03/2021   JOINT REPLACEMENT     Dr. 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   Procedure: LEFT TOTAL KNEE ARTHROPLASTY;  Surgeon: [name], MD;  Location: [facility];  Service: Orthopedics;  Laterality: Left;  70 mins with abductor block   TOTAL KNEE ARTHROPLASTY Right 04/03/2019   Procedure: RIGHT TOTAL KNEE ARTHROPLASTY;  Surgeon: [name], MD;  Location: [facility];  Service: Orthopedics;  Laterality: Right;  70 mins   URETER SURGERY     Family History  Problem Relation Age of Onset   Cancer Mother    Diabetes type II Mother    Hyperlipidemia Mother    Coronary artery disease Father    Heart attack Father    Social History   Socioeconomic History   Marital status: Married    Spouse name: [name]   Number of children: 3   Years of education: 12   Highest education level: Not on file  Occupational History   Occupation: Retired  Tobacco Use   Smoking status: Former    Packs/day: 2.00    Years: 10.00    Pack years: 20.00    Types: Cigarettes, Cigars    Quit date: 11/15/1981    Years since quitting: 39.9   Smokeless tobacco: Never  Vaping Use   Vaping Use: Never used  Substance and Sexual Activity   Alcohol use: Not Currently    Comment: SOCIAL   Drug use: No   Sexual activity: Not Currently   Other Topics Concern   Not on file  Social History Narrative   Lives in [place], [place] with wife. Exercising 3-4x/week.    Social Determinants of Health   Financial Resource Strain: Low Risk    Difficulty of Paying Living Expenses: Not hard at all  Food Insecurity: No Food Insecurity   Worried About Charity fundraiser in the Last Year: Never true   [place] in the Last Year: Never  true  Transportation Needs: No Transportation Needs   Lack of Transportation (Medical): No   Lack of Transportation (Non-Medical): No  Physical Activity: Insufficiently Active   Days of Exercise per Week: 3 days   Minutes of Exercise per Session: 40 min  Stress: No Stress Concern Present   Feeling of Stress : Only a little  Social Connections: Moderately Isolated   Frequency of Communication with Friends and Family: Twice a week   Frequency of Social Gatherings with Friends and Family: Three times a week   Attends Religious Services: Never   Active Member of Clubs or Organizations: No   Attends Music therapist: Never   Marital Status: Married    Tobacco Counseling Counseling given: Not Answered   Clinical Intake:  Pre-visit preparation completed: No  Pain : No/denies pain     Nutritional Status: BMI 25 -29 Overweight Diabetes: No  How often do you need to have someone help you when you read instructions, pamphlets, or other written materials from your doctor or pharmacy?: 2 - Rarely  Diabetic?no  Interpreter Needed?: No      Activities of Daily Living In your present state of health, do you have any difficulty performing the following activities: 10/19/2021  Hearing? Y  Vision? N  Difficulty concentrating or making decisions? N  Walking or climbing stairs? N  Dressing or bathing? N  Doing errands, shopping? N  Preparing Food and eating ? N  Using the Toilet? N  In the past six months, have you accidently leaked urine? N  Do you have problems with loss  of bowel control? N  Managing your Medications? N  Managing your Finances? N  Housekeeping or managing your Housekeeping? N  Some recent data might be hidden    Patient Care Team: Lindell Spar, MD as PCP - General (Internal Medicine) Sherren Mocha, MD as PCP - Cardiology (Cardiology) Sharmon Revere as Physician Assistant (Cardiology)  Indicate any recent Medical Services you may have received from other than [facility] providers in the past year (date may be approximate).     Assessment:   This is a routine wellness examination for [name] [name].  Hearing/Vision screen No results found.  Dietary issues and exercise activities discussed: Current Exercise Habits: Structured exercise class;The patient has a physically strenuous job, but has no regular exercise apart from work., Type of exercise: stretching, Time (Minutes): 45, Frequency (Times/Week): 3, Weekly Exercise (Minutes/Week): 135, Intensity: Mild, Exercise limited by: orthopedic condition(s)   Goals Addressed             This Visit's Progress    Patient Stated       Continue going to the [employer] 3 days a week and staying as active as possible     Prevent falls         Depression Screen PHQ 2/9 Scores 10/19/2021 05/07/2021 04/14/2021 01/07/2021 12/09/2020 11/18/2020 10/15/2020  PHQ - 2 Score 0 0 0 0 0 0 0  PHQ- 9 Score - - - - - - -    Fall Risk Fall Risk  10/19/2021 05/26/2021 05/07/2021 04/14/2021 01/07/2021  Falls in the past year? 1 0 0 0 0  Number falls in past yr: 0 - 0 0 0  Injury with Fall? 0 - 0 0 0  Risk for fall due to : - - No Fall Risks No Fall Risks No Fall Risks  Follow up - - Falls evaluation completed Falls evaluation completed Falls evaluation completed    FALL RISK  PREVENTION PERTAINING TO THE HOME:  Any stairs in or around the home? Yes  If so, are there any without handrails? No  Home free of loose throw rugs in walkways, pet beds, electrical cords, etc? No  Adequate lighting in your home to reduce risk  of falls? No   ASSISTIVE DEVICES UTILIZED TO PREVENT FALLS:  Life alert? No  Use of a cane, walker or w/c? Yes  Grab bars in the bathroom? Yes  Shower chair or bench in shower? No  Elevated toilet seat or a handicapped toilet? No    Cognitive Function:     6CIT Screen 10/19/2021 09/28/2018  What Year? 0 points 0 points  What month? 0 points 0 points  What time? 0 points 0 points  Count back from 20 2 points 0 points  Months in reverse 4 points 0 points  Repeat phrase 0 points 0 points  Total Score 6 0    Immunizations Immunization History  Administered Date(s) Administered   Fluad Quad(high Dose 65+) 08/23/2019, 08/26/2020, 08/25/2021   Influenza, High Dose Seasonal PF 09/14/2017, 09/21/2018   Influenza-Unspecified 07/27/2016   Moderna Sars-Covid-2 Vaccination 01/14/2020, 02/11/2020, 11/26/2020   Pneumococcal Conjugate-13 06/20/2015   Tdap 03/15/2015   Zoster, Live 10/01/2011    TDAP status: Up to date  Flu Vaccine status: Up to date  Pneumococcal vaccine status: Due, Education has been provided regarding the importance of this vaccine. Advised may receive this vaccine at local pharmacy or Health Dept. Aware to provide a copy of the vaccination record if obtained from local pharmacy or Health Dept. Verbalized acceptance and understanding.  Covid-19 vaccine status: Completed vaccines  Qualifies for Shingles Vaccine? Yes   Zostavax completed No   Shingrix Completed?: No.    Education has been provided regarding the importance of this vaccine. Patient has been advised to call insurance company to determine out of pocket expense if they have not yet received this vaccine. Advised may also receive vaccine at local pharmacy or Health Dept. Verbalized acceptance and understanding.  Screening Tests Health Maintenance  Topic Date Due   Zoster Vaccines- Shingrix (1 of 2) Never done   Pneumonia Vaccine 51+ Years old (2 - PPSV23 if available, else PCV20) 06/19/2016   COVID-19  Vaccine (4 - Booster for Moderna series) 01/21/2021   TETANUS/TDAP  03/14/2025   INFLUENZA VACCINE  Completed   HPV VACCINES  Aged Out    Health Maintenance  Health Maintenance Due  Topic Date Due   Zoster Vaccines- Shingrix (1 of 2) Never done   Pneumonia Vaccine 28+ Years old (2 - PPSV23 if available, else PCV20) 06/19/2016   COVID-19 Vaccine (4 - Booster for Moderna series) 01/21/2021    Colorectal cancer screening: No longer required.   Lung Cancer Screening: (Low Dose CT Chest recommended if Age 65-80 years, 30 pack-year currently smoking OR have quit w/in 15years.) does not qualify.   Lung Cancer Screening Referral: na  Additional Screening:  Hepatitis C Screening: does qualify; no know risk factors for Hep C. Born before [place] Screening: Recommended annual ophthalmology exams for early detection of glaucoma and other disorders of the eye. Is the patient up to date with their annual eye exam?  Yes  Who is the provider or what is the name of the office in which the patient attends annual eye exams? [place] If pt is not established with a provider, would they like to be referred to a provider to establish care? No .   Dental  Screening: Recommended annual dental exams for proper oral hygiene  Community Resource Referral / Chronic Care Management: CRR required this visit?  No   CCM required this visit?  No      Plan:     I have personally reviewed and noted the following in the patient's chart:   Medical and social history Use of alcohol, tobacco or illicit drugs  Current medications and supplements including opioid prescriptions. Patient is not currently taking opioid prescriptions. Functional ability and status Nutritional status Physical activity Advanced directives List of other physicians Hospitalizations, surgeries, and ER visits in previous 12 months Vitals Screenings to include cognitive, depression, and falls Referrals and appointments  In  addition, I have reviewed and discussed with patient certain preventive protocols, quality metrics, and best practice recommendations. A written personalized care plan for preventive services as well as general preventive health recommendations were provided to patient.     Kate Sable, LPN, LPN   26/07/4853   Nurse Notes:  Mr. Tabora , Thank you for taking time to come for your Medicare Wellness Visit. I appreciate your ongoing commitment to your health goals. Please review the following plan we discussed and let me know if I can assist you in the future.   These are the goals we discussed:  Goals      Patient Stated     Continue going to the [facility] 3 days a week and staying as active as possible     Prevent falls        This is a list of the screening recommended for you and due dates:  Health Maintenance  Topic Date Due   Zoster (Shingles) Vaccine (1 of 2) Never done   Pneumonia Vaccine (2 - PPSV23 if available, else PCV20) 06/19/2016   COVID-19 Vaccine (4 - Booster for Moderna series) 01/21/2021   Tetanus Vaccine  03/14/2025   Flu Shot  Completed   HPV Vaccine  Aged Out

## 2021-10-22 DIAGNOSIS — I4821 Permanent atrial fibrillation: Secondary | ICD-10-CM | POA: Diagnosis not present

## 2021-10-22 DIAGNOSIS — I2510 Atherosclerotic heart disease of native coronary artery without angina pectoris: Secondary | ICD-10-CM | POA: Diagnosis not present

## 2021-10-22 DIAGNOSIS — N1831 Chronic kidney disease, stage 3a: Secondary | ICD-10-CM | POA: Diagnosis not present

## 2021-10-23 LAB — CMP14+EGFR
ALT: 11 IU/L (ref 0–44)
AST: 19 IU/L (ref 0–40)
Albumin/Globulin Ratio: 1.4 (ref 1.2–2.2)
Albumin: 3.6 g/dL (ref 3.6–4.6)
Alkaline Phosphatase: 92 IU/L (ref 44–121)
BUN/Creatinine Ratio: 16 (ref 10–24)
BUN: 21 mg/dL (ref 8–27)
Bilirubin Total: 0.7 mg/dL (ref 0.0–1.2)
CO2: 24 mmol/L (ref 20–29)
Calcium: 8.5 mg/dL — ABNORMAL LOW (ref 8.6–10.2)
Chloride: 102 mmol/L (ref 96–106)
Creatinine, Ser: 1.31 mg/dL — ABNORMAL HIGH (ref 0.76–1.27)
Globulin, Total: 2.6 g/dL (ref 1.5–4.5)
Glucose: 97 mg/dL (ref 70–99)
Potassium: 4.4 mmol/L (ref 3.5–5.2)
Sodium: 139 mmol/L (ref 134–144)
Total Protein: 6.2 g/dL (ref 6.0–8.5)
eGFR: 54 mL/min/{1.73_m2} — ABNORMAL LOW (ref >59)

## 2021-10-23 LAB — LIPID PANEL
Chol/HDL Ratio: 2.8 ratio (ref 0.0–5.0)
Cholesterol, Total: 115 mg/dL (ref 100–199)
HDL: 41 mg/dL (ref >39)
LDL Chol Calc (NIH): 58 mg/dL (ref 0–99)
Triglycerides: 80 mg/dL (ref 0–149)
VLDL Cholesterol Cal: 16 mg/dL (ref 5–40)

## 2021-10-23 LAB — CBC WITH DIFFERENTIAL/PLATELET
Basophils Absolute: 0.1 10*3/uL (ref 0.0–0.2)
Basos: 1 %
EOS (ABSOLUTE): 0.2 10*3/uL (ref 0.0–0.4)
Eos: 3 %
Hematocrit: 36.7 % — ABNORMAL LOW (ref 37.5–51.0)
Hemoglobin: 11.8 g/dL — ABNORMAL LOW (ref 13.0–17.7)
Immature Grans (Abs): 0 10*3/uL (ref 0.0–0.1)
Immature Granulocytes: 1 %
Lymphocytes Absolute: 0.6 10*3/uL — ABNORMAL LOW (ref 0.7–3.1)
Lymphs: 10 %
MCH: 29.4 pg (ref 26.6–33.0)
MCHC: 32.2 g/dL (ref 31.5–35.7)
MCV: 91 fL (ref 79–97)
Monocytes Absolute: 0.7 10*3/uL (ref 0.1–0.9)
Monocytes: 11 %
Neutrophils Absolute: 4.5 10*3/uL (ref 1.4–7.0)
Neutrophils: 74 %
Platelets: 265 10*3/uL (ref 150–450)
RBC: 4.02 x10E6/uL — ABNORMAL LOW (ref 4.14–5.80)
RDW: 14.4 % (ref 11.6–15.4)
WBC: 6 10*3/uL (ref 3.4–10.8)

## 2021-10-23 LAB — TSH+FREE T4
Free T4: 1.12 ng/dL (ref 0.82–1.77)
TSH: 4.89 u[IU]/mL — ABNORMAL HIGH (ref 0.450–4.500)

## 2021-11-03 ENCOUNTER — Encounter: Payer: Self-pay | Admitting: Internal Medicine

## 2021-11-03 ENCOUNTER — Other Ambulatory Visit: Payer: Self-pay

## 2021-11-03 ENCOUNTER — Ambulatory Visit (INDEPENDENT_AMBULATORY_CARE_PROVIDER_SITE_OTHER): Payer: Medicare HMO | Admitting: Internal Medicine

## 2021-11-03 VITALS — BP 134/76 | HR 73 | Resp 16 | Ht 72.0 in | Wt 182.0 lb

## 2021-11-03 DIAGNOSIS — Z23 Encounter for immunization: Secondary | ICD-10-CM

## 2021-11-03 DIAGNOSIS — E038 Other specified hypothyroidism: Secondary | ICD-10-CM

## 2021-11-03 DIAGNOSIS — D631 Anemia in chronic kidney disease: Secondary | ICD-10-CM

## 2021-11-03 DIAGNOSIS — N1831 Chronic kidney disease, stage 3a: Secondary | ICD-10-CM | POA: Diagnosis not present

## 2021-11-03 DIAGNOSIS — Z0001 Encounter for general adult medical examination with abnormal findings: Secondary | ICD-10-CM | POA: Diagnosis not present

## 2021-11-03 NOTE — Progress Notes (Signed)
Established Patient Office Visit  Subjective:  Patient ID: [name] [name], male    DOB: 05-18-1941  Age: 80 y.o. MRN: [id number]  CC:  Chief Complaint  Patient presents with   Annual Exam    HPI Mirza Fessel is a 80 y.o. male with past medical history of CAD s/p stent placement, A Fib on Eliquis, CVA, HTN, GERD, h/o right nephrectomy for ureteral ca., CKD stage 3 and insomnia who presents for annual physical.  He has been doing well overall.  He had pleural tap twice since the last visit for pleural effusion, but post procedure x-ray was indifferent.  He denies any recent worsening of cough or dyspnea.  Blood tests were reviewed and discussed with the patient in detail.  He received PCV20 in the office today.  Past Medical History:  Diagnosis Date   Acute ischemic stroke ([place]) 09/29/2020   Acute myocardial infarction of other inferior wall, episode of care unspecified 03/02/2007   Arthritis    shoulders and ribs    Atrial fibrillation ([place])    atrial fib/ LOV S Weaver PA 04/11/12 EPIC, - CHEST X RAY, EKG 5/13 EPIC   CAD (coronary artery disease)     s/p NSTEMI 04/08;  LHC 02/2007: Proximal LAD 75%, mid LAD 99%, proximal RCA 25%.  PCI:  Cypher DES to the proximal and mid LAD.  Last nuclear study 11/2011 (after a trip to the ED with CP): EF 58%, low risk study with small inferior wall infarct at the mid and basal level, no ischemia.  Last echo 01/2010: Mild LVH, EF 55-60%, mild AI, mild MR, severely dilated LA and RA   Closed fracture of rib of left side with delayed healing 07/25/2019   Contrast dye induced nephropathy    Hx ARF secondary to contrast nephropathy   Dyslipidemia    Dysrhythmia    Elevated blood-pressure reading, without diagnosis of hypertension 11/07/2019   Epididymitis    Gallstones 05/30/2019   History of blood transfusion    Myocardial infarction ([place]) 2008   Pleurisy    2012   Pneumonia    hx of    Renal cyst    Left; 20 cm   Spinal stenosis    Urothelial cancer  ([place])    Dr. [name],  skin cancer non melanoma   Wedge compression fracture of third lumbar vertebra with routine healing 05/30/2019    Past Surgical History:  Procedure Laterality Date   arm surgery     orif right elbow   CARDIAC CATHETERIZATION  2008   coronary stents      CYSTECTOMY  07/15/08   CYSTOSCOPY WITH BIOPSY  03/23/2012   Procedure: CYSTOSCOPY WITH BIOPSY;  Surgeon: [name], MD;  Location: [facility];  Service: Urology;  Laterality: Right;   BRUSH BIOPSY RIGHT URETERAL STENT   CYSTOSCOPY WITH URETEROSCOPY  03/23/2012   Procedure: CYSTOSCOPY WITH URETEROSCOPY;  Surgeon: [name], MD;  Location: [facility];  Service: Urology;  Laterality: Right;    **OR Room #8 requested**  C-ARM    CYSTOSCOPY/RETROGRADE/URETEROSCOPY  02/15/2012   Procedure: CYSTOSCOPY/RETROGRADE/URETEROSCOPY;  Surgeon: [name], MD;  Location: [facility];  Service: Urology;  Laterality: Right;  C-ARM   IR THORACENTESIS ASP PLEURAL SPACE W/IMG GUIDE  12/26/2020   IR THORACENTESIS ASP PLEURAL SPACE W/IMG GUIDE  08/03/2021   JOINT REPLACEMENT     Dr. [name] 09-05-18    left pinky finger      tip removed from accident   right kidney removed  2013   TOTAL KNEE ARTHROPLASTY Left 09/05/2018   Procedure: LEFT TOTAL KNEE ARTHROPLASTY;  Surgeon: [name], MD;  Location: [facility];  Service: Orthopedics;  Laterality: Left;  70 mins with abductor block   TOTAL KNEE ARTHROPLASTY Right 04/03/2019   Procedure: RIGHT TOTAL KNEE ARTHROPLASTY;  Surgeon: [name], MD;  Location: [facility];  Service: Orthopedics;  Laterality: Right;  70 mins   URETER SURGERY      Family History  Problem Relation Age of Onset   Cancer Mother    Diabetes type II Mother    Hyperlipidemia Mother    Coronary artery disease Father    Heart attack Father     Social History   Socioeconomic History   Marital status: Married    Spouse name: [name]   Number of children: 3   Years of education: 12   Highest education level: Not on file   Occupational History   Occupation: Retired  Tobacco Use   Smoking status: Former    Packs/day: 2.00    Years: 10.00    Pack years: 20.00    Types: Cigarettes, Cigars    Quit date: 11/15/1981    Years since quitting: 40.0   Smokeless tobacco: Never  Vaping Use   Vaping Use: Never used  Substance and Sexual Activity   Alcohol use: Not Currently    Comment: SOCIAL   Drug use: No   Sexual activity: Not Currently  Other Topics Concern   Not on file  Social History Narrative   Lives in [place], [place] with wife. Exercising 3-4x/week.    Social Determinants of Health   Financial Resource Strain: Low Risk    Difficulty of Paying Living Expenses: Not hard at all  Food Insecurity: No Food Insecurity   Worried About Charity fundraiser in the Last Year: Never true   [place] in the Last Year: Never true  Transportation Needs: No Transportation Needs   Lack of Transportation (Medical): No   Lack of Transportation (Non-Medical): No  Physical Activity: Insufficiently Active   Days of Exercise per Week: 3 days   Minutes of Exercise per Session: 40 min  Stress: No Stress Concern Present   Feeling of Stress : Only a little  Social Connections: Moderately Isolated   Frequency of Communication with Friends and Family: Twice a week   Frequency of Social Gatherings with Friends and Family: Three times a week   Attends Religious Services: Never   Active Member of Clubs or Organizations: No   Attends Archivist Meetings: Never   Marital Status: Married  Human resources officer Violence: Not on file    Outpatient Medications Prior to Visit  Medication Sig Dispense Refill   acetaminophen (TYLENOL) 650 MG CR tablet Take 650 mg by mouth every 8 (eight) hours as needed for pain.     apixaban (ELIQUIS) 5 MG TABS tablet Take 1 tablet (5 mg total) by mouth 2 (two) times daily. 180 tablet 1   atorvastatin (LIPITOR) 40 MG tablet Take 1 tablet (40 mg total) by mouth every evening. 90  tablet 3   calcitRIOL (ROCALTROL) 0.5 MCG capsule Take 0.5 mcg by mouth daily.     furosemide (LASIX) 40 MG tablet TAKE 1 TABLET EVERY DAY 90 tablet 3   niacin (NIASPAN) 500 MG CR tablet Take 1 tablet (500 mg total) by mouth at bedtime.     nitroGLYCERIN (NITROSTAT) 0.4 MG SL tablet Place 1 tablet (0.4 mg total) under the tongue  every 5 (five) minutes as needed for chest pain. 25 tablet 4   Omega-3 Fatty Acids (FISH OIL) 1200 MG CAPS Take 1,200 mg by mouth daily.      traZODone (DESYREL) 50 MG tablet Take 1.5 tablets (75 mg total) by mouth at bedtime as needed for sleep. 135 tablet 3   vitamin B-12 (CYANOCOBALAMIN) 500 MCG tablet Take 500 mcg by mouth daily.     No facility-administered medications prior to visit.    Allergies  Allergen Reactions   Contrast Media [Iodinated Diagnostic Agents]     Contrast-induced nephropathy requiring dialysis in 02/2007.   Iodine-131 Other (See Comments)    Kidneys shut down   Other Other (See Comments)    Beta blockers cause bradycardia   Promethazine Other (See Comments)    Pt became drowsy and mild altered mental status    ROS Review of Systems  Constitutional:  Negative for chills and fever.  HENT:  Negative for congestion and trouble swallowing.   Eyes:  Negative for pain and discharge.  Respiratory:  Negative for cough and shortness of breath.   Cardiovascular:  Negative for chest pain and palpitations.  Gastrointestinal:  Negative for constipation, diarrhea, nausea and vomiting.  Endocrine: Negative for polydipsia and polyuria.  Genitourinary:  Negative for dysuria and hematuria.  Musculoskeletal:  Negative for neck pain and neck stiffness.  Skin:  Negative for rash.  Neurological:  Negative for dizziness, weakness, numbness and headaches.  Psychiatric/Behavioral:  Negative for agitation and behavioral problems.      Objective:    Physical Exam Vitals reviewed.  Constitutional:      General: He is not in acute distress.     Appearance: He is not diaphoretic.  HENT:     Head: Normocephalic and atraumatic.     Nose: Nose normal.     Mouth/Throat:     Mouth: Mucous membranes are moist.     Pharynx: No oropharyngeal exudate or posterior oropharyngeal erythema.  Eyes:     General: No scleral icterus.    Extraocular Movements: Extraocular movements intact.  Cardiovascular:     Rate and Rhythm: Normal rate and regular rhythm.     Pulses: Normal pulses.     Heart sounds: Normal heart sounds. No murmur heard. Pulmonary:     Breath sounds: Normal breath sounds. No wheezing or rales.  Abdominal:     Palpations: Abdomen is soft.     Tenderness: There is no abdominal tenderness.  Musculoskeletal:     Cervical back: Neck supple. No tenderness.     Right lower leg: No edema.     Left lower leg: No edema.  Feet:     Right foot:     Toenail Condition: Fungal disease present.    Left foot:     Toenail Condition: Fungal disease present. Skin:    General: Skin is warm.     Findings: No rash.  Neurological:     General: No focal deficit present.     Mental Status: He is alert and oriented to person, place, and time.     Cranial Nerves: No cranial nerve deficit.     Sensory: No sensory deficit.     Motor: No weakness.  Psychiatric:        Mood and Affect: Mood normal.        Behavior: Behavior normal.    BP 134/76 (BP Location: Right Arm)    Pulse 73    Resp 16    Ht 6' (1.829 m)  Wt 182 lb 0.6 oz (82.6 kg)    SpO2 94%    BMI 24.69 kg/m  Wt Readings from Last 3 Encounters:  11/03/21 182 lb 0.6 oz (82.6 kg)  10/13/21 183 lb 0.6 oz (83 kg)  09/14/21 180 lb 12.8 oz (82 kg)    Lab Results  Component Value Date   TSH 4.890 (H) 10/22/2021   Lab Results  Component Value Date   WBC 6.0 10/22/2021   HGB 11.8 (L) 10/22/2021   HCT 36.7 (L) 10/22/2021   MCV 91 10/22/2021   PLT 265 10/22/2021   Lab Results  Component Value Date   NA 139 10/22/2021   K 4.4 10/22/2021   CO2 24 10/22/2021   GLUCOSE 97  10/22/2021   BUN 21 10/22/2021   CREATININE 1.31 (H) 10/22/2021   BILITOT 0.7 10/22/2021   ALKPHOS 92 10/22/2021   AST 19 10/22/2021   ALT 11 10/22/2021   PROT 6.2 10/22/2021   ALBUMIN 3.6 10/22/2021   CALCIUM 8.5 (L) 10/22/2021   ANIONGAP 8 10/01/2020   EGFR 54 (L) 10/22/2021   Lab Results  Component Value Date   CHOL 115 10/22/2021   Lab Results  Component Value Date   HDL 41 10/22/2021   Lab Results  Component Value Date   LDLCALC 58 10/22/2021   Lab Results  Component Value Date   TRIG 80 10/22/2021   Lab Results  Component Value Date   CHOLHDL 2.8 10/22/2021   Lab Results  Component Value Date   HGBA1C 5.9 (H) 09/30/2020      Assessment & Plan:   Problem List Items Addressed This Visit       Encounter for general adult medical examination with abnormal findings - Primary   Physical exam as documented. Counseling done  re healthy lifestyle involving commitment to 150 minutes exercise per week, heart healthy diet, and attaining healthy weight.The importance of adequate sleep also discussed. Changes in health habits are decided on by the patient with goals and time frames  set for achieving them. Immunization and cancer screening needs are specifically addressed at this visit.       Endocrine   Subclinical hypothyroidism    Lab Results  Component Value Date   TSH 4.890 (H) 10/22/2021  Will recheck TSH and free T4      Relevant Orders   TSH + free T4     Genitourinary   Chronic kidney disease, stage III (moderate) (HCC)    Has only left kidney On Calcitriol and Lasix Avoid nephrotoxic agents Follows up with Nephrologist      Relevant Orders   Basic Metabolic Panel (BMET)   Anemia due to stage 3a chronic kidney disease (HCC)    Normocytic anemia, could be multifactorial in the setting of CKD and chronic anticoagulation Advised to take iron supplement No signs of active bleeding currently Will recheck CBC      Relevant Orders   CBC with  Differential/Platelet     Other Visit Diagnoses     Need for pneumococcal vaccination       Relevant Orders   Pneumococcal polysaccharide vaccine 23-valent greater than or equal to 2yo subcutaneous/IM (Completed)       No orders of the defined types were placed in this encounter.   Follow-up: Return in about 6 months (around 05/04/2022).    Lindell Spar, MD

## 2021-11-03 NOTE — Patient Instructions (Signed)
Please continue to take medications as prescribed.  Please continue to follow low salt diet and ambulate as tolerated.  Your thyroid function test was slightly abnormal. We will recheck it later. Please get blood tests done before the next visit.

## 2021-11-05 DIAGNOSIS — N1831 Chronic kidney disease, stage 3a: Secondary | ICD-10-CM | POA: Insufficient documentation

## 2021-11-05 DIAGNOSIS — Z0001 Encounter for general adult medical examination with abnormal findings: Secondary | ICD-10-CM | POA: Insufficient documentation

## 2021-11-05 DIAGNOSIS — Z01818 Encounter for other preprocedural examination: Secondary | ICD-10-CM | POA: Insufficient documentation

## 2021-11-05 DIAGNOSIS — E038 Other specified hypothyroidism: Secondary | ICD-10-CM | POA: Insufficient documentation

## 2021-11-05 NOTE — Assessment & Plan Note (Signed)

## 2021-11-05 NOTE — Assessment & Plan Note (Signed)
Lab Results  Component Value Date   TSH 4.890 (H) 10/22/2021   Will recheck TSH and free T4

## 2021-11-05 NOTE — Assessment & Plan Note (Signed)
Has only left kidney On Calcitriol and Lasix Avoid nephrotoxic agents Follows up with Nephrologist

## 2021-11-05 NOTE — Assessment & Plan Note (Addendum)
Normocytic anemia, could be multifactorial in the setting of CKD and chronic anticoagulation Advised to take iron supplement No signs of active bleeding currently Will recheck CBC

## 2021-11-16 ENCOUNTER — Other Ambulatory Visit: Payer: Self-pay | Admitting: Internal Medicine

## 2021-11-17 ENCOUNTER — Other Ambulatory Visit: Payer: Self-pay | Admitting: *Deleted

## 2021-11-17 MED ORDER — FUROSEMIDE 40 MG PO TABS: 40.0000 mg | ORAL_TABLET | Freq: Every day | ORAL | 3 refills | Status: AC

## 2021-12-07 DIAGNOSIS — N189 Chronic kidney disease, unspecified: Secondary | ICD-10-CM | POA: Diagnosis not present

## 2021-12-07 DIAGNOSIS — N183 Chronic kidney disease, stage 3 unspecified: Secondary | ICD-10-CM | POA: Diagnosis not present

## 2021-12-09 DIAGNOSIS — D631 Anemia in chronic kidney disease: Secondary | ICD-10-CM | POA: Diagnosis not present

## 2021-12-09 DIAGNOSIS — I129 Hypertensive chronic kidney disease with stage 1 through stage 4 chronic kidney disease, or unspecified chronic kidney disease: Secondary | ICD-10-CM | POA: Diagnosis not present

## 2021-12-09 DIAGNOSIS — N2581 Secondary hyperparathyroidism of renal origin: Secondary | ICD-10-CM | POA: Diagnosis not present

## 2021-12-09 DIAGNOSIS — N183 Chronic kidney disease, stage 3 unspecified: Secondary | ICD-10-CM | POA: Diagnosis not present

## 2021-12-15 DIAGNOSIS — H52223 Regular astigmatism, bilateral: Secondary | ICD-10-CM | POA: Diagnosis not present

## 2021-12-15 DIAGNOSIS — H524 Presbyopia: Secondary | ICD-10-CM | POA: Diagnosis not present

## 2022-01-25 ENCOUNTER — Inpatient Hospital Stay (HOSPITAL_COMMUNITY)
Admission: EM | Admit: 2022-01-25 | Discharge: 2022-02-13 | DRG: 698 | Disposition: E | Payer: Medicare HMO | Attending: General Surgery | Admitting: General Surgery

## 2022-01-25 ENCOUNTER — Encounter (HOSPITAL_COMMUNITY): Payer: Self-pay | Admitting: General Surgery

## 2022-01-25 ENCOUNTER — Emergency Department (HOSPITAL_COMMUNITY): Payer: Medicare HMO

## 2022-01-25 ENCOUNTER — Encounter (HOSPITAL_COMMUNITY): Admission: EM | Disposition: E | Payer: Self-pay | Source: Home / Self Care

## 2022-01-25 ENCOUNTER — Other Ambulatory Visit: Payer: Self-pay

## 2022-01-25 DIAGNOSIS — Z8550 Personal history of malignant neoplasm of unspecified urinary tract organ: Secondary | ICD-10-CM

## 2022-01-25 DIAGNOSIS — R571 Hypovolemic shock: Secondary | ICD-10-CM | POA: Diagnosis not present

## 2022-01-25 DIAGNOSIS — I252 Old myocardial infarction: Secondary | ICD-10-CM | POA: Diagnosis not present

## 2022-01-25 DIAGNOSIS — E785 Hyperlipidemia, unspecified: Secondary | ICD-10-CM | POA: Diagnosis present

## 2022-01-25 DIAGNOSIS — S0230XA Fracture of orbital floor, unspecified side, initial encounter for closed fracture: Secondary | ICD-10-CM | POA: Diagnosis not present

## 2022-01-25 DIAGNOSIS — R579 Shock, unspecified: Secondary | ICD-10-CM | POA: Diagnosis not present

## 2022-01-25 DIAGNOSIS — I4811 Longstanding persistent atrial fibrillation: Secondary | ICD-10-CM | POA: Diagnosis not present

## 2022-01-25 DIAGNOSIS — Z20822 Contact with and (suspected) exposure to covid-19: Secondary | ICD-10-CM | POA: Diagnosis not present

## 2022-01-25 DIAGNOSIS — Z905 Acquired absence of kidney: Secondary | ICD-10-CM

## 2022-01-25 DIAGNOSIS — I462 Cardiac arrest due to underlying cardiac condition: Secondary | ICD-10-CM | POA: Diagnosis not present

## 2022-01-25 DIAGNOSIS — N400 Enlarged prostate without lower urinary tract symptoms: Secondary | ICD-10-CM | POA: Diagnosis present

## 2022-01-25 DIAGNOSIS — Z79899 Other long term (current) drug therapy: Secondary | ICD-10-CM

## 2022-01-25 DIAGNOSIS — I248 Other forms of acute ischemic heart disease: Secondary | ICD-10-CM | POA: Diagnosis present

## 2022-01-25 DIAGNOSIS — W1830XA Fall on same level, unspecified, initial encounter: Secondary | ICD-10-CM | POA: Diagnosis present

## 2022-01-25 DIAGNOSIS — I469 Cardiac arrest, cause unspecified: Secondary | ICD-10-CM | POA: Diagnosis not present

## 2022-01-25 DIAGNOSIS — D649 Anemia, unspecified: Secondary | ICD-10-CM | POA: Diagnosis not present

## 2022-01-25 DIAGNOSIS — D62 Acute posthemorrhagic anemia: Secondary | ICD-10-CM | POA: Diagnosis not present

## 2022-01-25 DIAGNOSIS — D696 Thrombocytopenia, unspecified: Secondary | ICD-10-CM | POA: Diagnosis not present

## 2022-01-25 DIAGNOSIS — M2578 Osteophyte, vertebrae: Secondary | ICD-10-CM | POA: Diagnosis not present

## 2022-01-25 DIAGNOSIS — I517 Cardiomegaly: Secondary | ICD-10-CM | POA: Diagnosis not present

## 2022-01-25 DIAGNOSIS — R0902 Hypoxemia: Secondary | ICD-10-CM | POA: Diagnosis not present

## 2022-01-25 DIAGNOSIS — R310 Gross hematuria: Secondary | ICD-10-CM | POA: Diagnosis not present

## 2022-01-25 DIAGNOSIS — I495 Sick sinus syndrome: Secondary | ICD-10-CM | POA: Diagnosis present

## 2022-01-25 DIAGNOSIS — Z66 Do not resuscitate: Secondary | ICD-10-CM | POA: Diagnosis not present

## 2022-01-25 DIAGNOSIS — S0990XA Unspecified injury of head, initial encounter: Secondary | ICD-10-CM | POA: Diagnosis not present

## 2022-01-25 DIAGNOSIS — N2889 Other specified disorders of kidney and ureter: Secondary | ICD-10-CM | POA: Diagnosis present

## 2022-01-25 DIAGNOSIS — I4821 Permanent atrial fibrillation: Secondary | ICD-10-CM | POA: Diagnosis not present

## 2022-01-25 DIAGNOSIS — N1831 Chronic kidney disease, stage 3a: Secondary | ICD-10-CM | POA: Diagnosis present

## 2022-01-25 DIAGNOSIS — Z930 Tracheostomy status: Secondary | ICD-10-CM | POA: Diagnosis not present

## 2022-01-25 DIAGNOSIS — Z01818 Encounter for other preprocedural examination: Secondary | ICD-10-CM | POA: Diagnosis not present

## 2022-01-25 DIAGNOSIS — I2510 Atherosclerotic heart disease of native coronary artery without angina pectoris: Secondary | ICD-10-CM | POA: Diagnosis present

## 2022-01-25 DIAGNOSIS — Z833 Family history of diabetes mellitus: Secondary | ICD-10-CM

## 2022-01-25 DIAGNOSIS — I672 Cerebral atherosclerosis: Secondary | ICD-10-CM | POA: Diagnosis not present

## 2022-01-25 DIAGNOSIS — R1084 Generalized abdominal pain: Secondary | ICD-10-CM | POA: Diagnosis not present

## 2022-01-25 DIAGNOSIS — K8020 Calculus of gallbladder without cholecystitis without obstruction: Secondary | ICD-10-CM | POA: Diagnosis not present

## 2022-01-25 DIAGNOSIS — K661 Hemoperitoneum: Secondary | ICD-10-CM | POA: Diagnosis not present

## 2022-01-25 DIAGNOSIS — W19XXXA Unspecified fall, initial encounter: Secondary | ICD-10-CM | POA: Diagnosis present

## 2022-01-25 DIAGNOSIS — N179 Acute kidney failure, unspecified: Secondary | ICD-10-CM | POA: Diagnosis not present

## 2022-01-25 DIAGNOSIS — R570 Cardiogenic shock: Secondary | ICD-10-CM | POA: Diagnosis not present

## 2022-01-25 DIAGNOSIS — Z91041 Radiographic dye allergy status: Secondary | ICD-10-CM

## 2022-01-25 DIAGNOSIS — R34 Anuria and oliguria: Secondary | ICD-10-CM | POA: Diagnosis not present

## 2022-01-25 DIAGNOSIS — D6832 Hemorrhagic disorder due to extrinsic circulating anticoagulants: Secondary | ICD-10-CM | POA: Diagnosis present

## 2022-01-25 DIAGNOSIS — T1490XA Injury, unspecified, initial encounter: Secondary | ICD-10-CM

## 2022-01-25 DIAGNOSIS — C689 Malignant neoplasm of urinary organ, unspecified: Secondary | ICD-10-CM | POA: Insufficient documentation

## 2022-01-25 DIAGNOSIS — J9690 Respiratory failure, unspecified, unspecified whether with hypoxia or hypercapnia: Secondary | ICD-10-CM | POA: Diagnosis not present

## 2022-01-25 DIAGNOSIS — R972 Elevated prostate specific antigen [PSA]: Secondary | ICD-10-CM | POA: Diagnosis present

## 2022-01-25 DIAGNOSIS — Y92015 Private garage of single-family (private) house as the place of occurrence of the external cause: Secondary | ICD-10-CM | POA: Diagnosis not present

## 2022-01-25 DIAGNOSIS — R58 Hemorrhage, not elsewhere classified: Secondary | ICD-10-CM | POA: Diagnosis not present

## 2022-01-25 DIAGNOSIS — R319 Hematuria, unspecified: Secondary | ICD-10-CM | POA: Diagnosis present

## 2022-01-25 DIAGNOSIS — Z515 Encounter for palliative care: Secondary | ICD-10-CM

## 2022-01-25 DIAGNOSIS — R001 Bradycardia, unspecified: Secondary | ICD-10-CM | POA: Diagnosis not present

## 2022-01-25 DIAGNOSIS — Z8249 Family history of ischemic heart disease and other diseases of the circulatory system: Secondary | ICD-10-CM

## 2022-01-25 DIAGNOSIS — S0291XA Unspecified fracture of skull, initial encounter for closed fracture: Secondary | ICD-10-CM | POA: Diagnosis not present

## 2022-01-25 DIAGNOSIS — I129 Hypertensive chronic kidney disease with stage 1 through stage 4 chronic kidney disease, or unspecified chronic kidney disease: Secondary | ICD-10-CM | POA: Diagnosis not present

## 2022-01-25 DIAGNOSIS — Z452 Encounter for adjustment and management of vascular access device: Secondary | ICD-10-CM | POA: Diagnosis not present

## 2022-01-25 DIAGNOSIS — R0603 Acute respiratory distress: Secondary | ICD-10-CM

## 2022-01-25 DIAGNOSIS — Z87891 Personal history of nicotine dependence: Secondary | ICD-10-CM

## 2022-01-25 DIAGNOSIS — R1010 Upper abdominal pain, unspecified: Secondary | ICD-10-CM | POA: Diagnosis not present

## 2022-01-25 DIAGNOSIS — Z7901 Long term (current) use of anticoagulants: Secondary | ICD-10-CM

## 2022-01-25 DIAGNOSIS — J90 Pleural effusion, not elsewhere classified: Secondary | ICD-10-CM | POA: Diagnosis not present

## 2022-01-25 DIAGNOSIS — K219 Gastro-esophageal reflux disease without esophagitis: Secondary | ICD-10-CM | POA: Diagnosis present

## 2022-01-25 DIAGNOSIS — J9811 Atelectasis: Secondary | ICD-10-CM | POA: Diagnosis not present

## 2022-01-25 DIAGNOSIS — S37092A Other injury of left kidney, initial encounter: Principal | ICD-10-CM | POA: Diagnosis present

## 2022-01-25 DIAGNOSIS — T794XXA Traumatic shock, initial encounter: Secondary | ICD-10-CM | POA: Diagnosis present

## 2022-01-25 DIAGNOSIS — E872 Acidosis, unspecified: Secondary | ICD-10-CM | POA: Diagnosis present

## 2022-01-25 DIAGNOSIS — T45515A Adverse effect of anticoagulants, initial encounter: Secondary | ICD-10-CM | POA: Diagnosis present

## 2022-01-25 DIAGNOSIS — S0240FA Zygomatic fracture, left side, initial encounter for closed fracture: Secondary | ICD-10-CM | POA: Diagnosis not present

## 2022-01-25 DIAGNOSIS — J984 Other disorders of lung: Secondary | ICD-10-CM | POA: Diagnosis not present

## 2022-01-25 DIAGNOSIS — Z4682 Encounter for fitting and adjustment of non-vascular catheter: Secondary | ICD-10-CM | POA: Diagnosis not present

## 2022-01-25 DIAGNOSIS — Z955 Presence of coronary angioplasty implant and graft: Secondary | ICD-10-CM

## 2022-01-25 DIAGNOSIS — Z978 Presence of other specified devices: Secondary | ICD-10-CM

## 2022-01-25 DIAGNOSIS — Z043 Encounter for examination and observation following other accident: Secondary | ICD-10-CM | POA: Diagnosis not present

## 2022-01-25 DIAGNOSIS — R578 Other shock: Secondary | ICD-10-CM

## 2022-01-25 DIAGNOSIS — Z23 Encounter for immunization: Secondary | ICD-10-CM | POA: Diagnosis not present

## 2022-01-25 DIAGNOSIS — Z8673 Personal history of transient ischemic attack (TIA), and cerebral infarction without residual deficits: Secondary | ICD-10-CM

## 2022-01-25 DIAGNOSIS — Y9301 Activity, walking, marching and hiking: Secondary | ICD-10-CM | POA: Diagnosis present

## 2022-01-25 DIAGNOSIS — I4891 Unspecified atrial fibrillation: Secondary | ICD-10-CM | POA: Diagnosis not present

## 2022-01-25 DIAGNOSIS — Z888 Allergy status to other drugs, medicaments and biological substances status: Secondary | ICD-10-CM

## 2022-01-25 DIAGNOSIS — Z86008 Personal history of in-situ neoplasm of other site: Secondary | ICD-10-CM

## 2022-01-25 DIAGNOSIS — Z4659 Encounter for fitting and adjustment of other gastrointestinal appliance and device: Secondary | ICD-10-CM

## 2022-01-25 DIAGNOSIS — Z809 Family history of malignant neoplasm, unspecified: Secondary | ICD-10-CM

## 2022-01-25 DIAGNOSIS — N183 Chronic kidney disease, stage 3 unspecified: Secondary | ICD-10-CM | POA: Diagnosis not present

## 2022-01-25 DIAGNOSIS — J9601 Acute respiratory failure with hypoxia: Secondary | ICD-10-CM | POA: Diagnosis present

## 2022-01-25 DIAGNOSIS — E875 Hyperkalemia: Secondary | ICD-10-CM | POA: Diagnosis not present

## 2022-01-25 DIAGNOSIS — K4090 Unilateral inguinal hernia, without obstruction or gangrene, not specified as recurrent: Secondary | ICD-10-CM | POA: Diagnosis not present

## 2022-01-25 DIAGNOSIS — R918 Other nonspecific abnormal finding of lung field: Secondary | ICD-10-CM | POA: Diagnosis not present

## 2022-01-25 DIAGNOSIS — Z96653 Presence of artificial knee joint, bilateral: Secondary | ICD-10-CM | POA: Diagnosis present

## 2022-01-25 DIAGNOSIS — Z7189 Other specified counseling: Secondary | ICD-10-CM | POA: Diagnosis not present

## 2022-01-25 DIAGNOSIS — Z83438 Family history of other disorder of lipoprotein metabolism and other lipidemia: Secondary | ICD-10-CM

## 2022-01-25 LAB — COMPREHENSIVE METABOLIC PANEL
ALT: 13 U/L (ref 0–44)
AST: 23 U/L (ref 15–41)
Albumin: 2.9 g/dL — ABNORMAL LOW (ref 3.5–5.0)
Alkaline Phosphatase: 69 U/L (ref 38–126)
Anion gap: 13 (ref 5–15)
BUN: 21 mg/dL (ref 8–23)
CO2: 24 mmol/L (ref 22–32)
Calcium: 8.6 mg/dL — ABNORMAL LOW (ref 8.9–10.3)
Chloride: 102 mmol/L (ref 98–111)
Creatinine, Ser: 1.46 mg/dL — ABNORMAL HIGH (ref 0.61–1.24)
GFR, Estimated: 48 mL/min — ABNORMAL LOW (ref >60)
Glucose, Bld: 200 mg/dL — ABNORMAL HIGH (ref 70–99)
Potassium: 3.7 mmol/L (ref 3.5–5.1)
Sodium: 139 mmol/L (ref 135–145)
Total Bilirubin: 1 mg/dL (ref 0.3–1.2)
Total Protein: 6.3 g/dL — ABNORMAL LOW (ref 6.5–8.1)

## 2022-01-25 LAB — CBC
HCT: 33.4 % — ABNORMAL LOW (ref 39.0–52.0)
HCT: 29.4 % — ABNORMAL LOW (ref 39.0–52.0)
HCT: 28.2 % — ABNORMAL LOW (ref 39.0–52.0)
Hemoglobin: 10.7 g/dL — ABNORMAL LOW (ref 13.0–17.0)
Hemoglobin: 10 g/dL — ABNORMAL LOW (ref 13.0–17.0)
Hemoglobin: 9.4 g/dL — ABNORMAL LOW (ref 13.0–17.0)
MCH: 30.3 pg (ref 26.0–34.0)
MCH: 32.2 pg (ref 26.0–34.0)
MCH: 31.3 pg (ref 26.0–34.0)
MCHC: 32 g/dL (ref 30.0–36.0)
MCHC: 34 g/dL (ref 30.0–36.0)
MCHC: 33.3 g/dL (ref 30.0–36.0)
MCV: 94.6 fL (ref 80.0–100.0)
MCV: 94.5 fL (ref 80.0–100.0)
MCV: 94 fL (ref 80.0–100.0)
Platelets: 317 10*3/uL (ref 150–400)
Platelets: 208 10*3/uL (ref 150–400)
Platelets: 209 10*3/uL (ref 150–400)
RBC: 3.53 MIL/uL — ABNORMAL LOW (ref 4.22–5.81)
RBC: 3.11 MIL/uL — ABNORMAL LOW (ref 4.22–5.81)
RBC: 3 MIL/uL — ABNORMAL LOW (ref 4.22–5.81)
RDW: 14 % (ref 11.5–15.5)
RDW: 14.2 % (ref 11.5–15.5)
RDW: 14.4 % (ref 11.5–15.5)
WBC: 15.4 10*3/uL — ABNORMAL HIGH (ref 4.0–10.5)
WBC: 18.6 10*3/uL — ABNORMAL HIGH (ref 4.0–10.5)
WBC: 18.5 10*3/uL — ABNORMAL HIGH (ref 4.0–10.5)
nRBC: 0 % (ref 0.0–0.2)
nRBC: 0 % (ref 0.0–0.2)
nRBC: 0 % (ref 0.0–0.2)

## 2022-01-25 LAB — I-STAT CHEM 8, ED
BUN: 25 mg/dL — ABNORMAL HIGH (ref 8–23)
Calcium, Ion: 1.06 mmol/L — ABNORMAL LOW (ref 1.15–1.40)
Chloride: 101 mmol/L (ref 98–111)
Creatinine, Ser: 1.4 mg/dL — ABNORMAL HIGH (ref 0.61–1.24)
Glucose, Bld: 192 mg/dL — ABNORMAL HIGH (ref 70–99)
HCT: 31 % — ABNORMAL LOW (ref 39.0–52.0)
Hemoglobin: 10.5 g/dL — ABNORMAL LOW (ref 13.0–17.0)
Potassium: 3.6 mmol/L (ref 3.5–5.1)
Sodium: 137 mmol/L (ref 135–145)
TCO2: 25 mmol/L (ref 22–32)

## 2022-01-25 LAB — BASIC METABOLIC PANEL
Anion gap: 13 (ref 5–15)
BUN: 25 mg/dL — ABNORMAL HIGH (ref 8–23)
CO2: 22 mmol/L (ref 22–32)
Calcium: 8.2 mg/dL — ABNORMAL LOW (ref 8.9–10.3)
Chloride: 104 mmol/L (ref 98–111)
Creatinine, Ser: 1.81 mg/dL — ABNORMAL HIGH (ref 0.61–1.24)
GFR, Estimated: 37 mL/min — ABNORMAL LOW (ref >60)
Glucose, Bld: 144 mg/dL — ABNORMAL HIGH (ref 70–99)
Potassium: 4.5 mmol/L (ref 3.5–5.1)
Sodium: 139 mmol/L (ref 135–145)

## 2022-01-25 LAB — ETHANOL: Alcohol, Ethyl (B): <10 mg/dL (ref <10)

## 2022-01-25 LAB — RESP PANEL BY RT-PCR (FLU A&B, COVID) ARPGX2
Influenza A by PCR: NEGATIVE
Influenza B by PCR: NEGATIVE
SARS Coronavirus 2 by RT PCR: NEGATIVE

## 2022-01-25 LAB — TROPONIN I (HIGH SENSITIVITY)
Troponin I (High Sensitivity): 23 ng/L — ABNORMAL HIGH (ref <18)
Troponin I (High Sensitivity): 57 ng/L — ABNORMAL HIGH (ref <18)

## 2022-01-25 LAB — PROTIME-INR
INR: 1.6 — ABNORMAL HIGH (ref 0.8–1.2)
Prothrombin Time: 19.4 seconds — ABNORMAL HIGH (ref 11.4–15.2)

## 2022-01-25 LAB — PREPARE RBC (CROSSMATCH)

## 2022-01-25 LAB — LACTIC ACID, PLASMA: Lactic Acid, Venous: 4.5 mmol/L (ref 0.5–1.9)

## 2022-01-25 LAB — MAGNESIUM: Magnesium: 1.8 mg/dL (ref 1.7–2.4)

## 2022-01-25 SURGERY — INVASIVE LAB ABORTED CASE

## 2022-01-25 MED ORDER — FUROSEMIDE 40 MG PO TABS
40.0000 mg | ORAL_TABLET | Freq: Every day | ORAL | Status: DC
  Administered 2022-01-26: 40 mg via ORAL
  Filled 2022-01-25: qty 1

## 2022-01-25 MED ORDER — ONDANSETRON HCL 4 MG/2ML IJ SOLN
4.0000 mg | Freq: Four times a day (QID) | INTRAMUSCULAR | Status: DC | PRN
  Administered 2022-01-25 – 2022-01-26 (×3): 4 mg via INTRAVENOUS
  Filled 2022-01-25 (×2): qty 2

## 2022-01-25 MED ORDER — FENTANYL CITRATE PF 50 MCG/ML IJ SOSY
50.0000 ug | PREFILLED_SYRINGE | INTRAMUSCULAR | Status: DC | PRN
  Administered 2022-01-25 (×3): 50 ug via INTRAVENOUS
  Filled 2022-01-25 (×3): qty 1

## 2022-01-25 MED ORDER — OXYCODONE HCL 5 MG PO TABS
5.0000 mg | ORAL_TABLET | ORAL | Status: DC | PRN
  Administered 2022-01-26 (×2): 5 mg via ORAL
  Filled 2022-01-25 (×2): qty 1

## 2022-01-25 MED ORDER — LACTATED RINGERS IV BOLUS
1000.0000 mL | Freq: Once | INTRAVENOUS | Status: AC
  Administered 2022-01-25: 1000 mL via INTRAVENOUS

## 2022-01-25 MED ORDER — SODIUM CHLORIDE 0.9 % IV BOLUS
INTRAVENOUS | Status: AC | PRN
  Administered 2022-01-25: 250 mL via INTRAVENOUS

## 2022-01-25 MED ORDER — LACTATED RINGERS IV BOLUS
1000.0000 mL | Freq: Once | INTRAVENOUS | Status: AC
Start: 2022-01-25 — End: 2022-01-25
  Administered 2022-01-25: 1000 mL via INTRAVENOUS

## 2022-01-25 MED ORDER — ACETAMINOPHEN 325 MG PO TABS
650.0000 mg | ORAL_TABLET | Freq: Three times a day (TID) | ORAL | Status: DC | PRN
  Administered 2022-01-26: 325 mg via ORAL
  Filled 2022-01-25: qty 2

## 2022-01-25 MED ORDER — ASPIRIN 81 MG PO CHEW
324.0000 mg | CHEWABLE_TABLET | Freq: Once | ORAL | Status: AC
  Administered 2022-01-25: 324 mg via ORAL
  Filled 2022-01-25: qty 4

## 2022-01-25 MED ORDER — LIDOCAINE HCL (PF) 1 % IJ SOLN
INTRAMUSCULAR | Status: AC
  Filled 2022-01-25: qty 30

## 2022-01-25 MED ORDER — PROTHROMBIN COMPLEX CONC HUMAN 500 UNITS IV KIT
2748.0000 [IU] | PACK | Status: AC
  Administered 2022-01-25: 2748 [IU] via INTRAVENOUS
  Filled 2022-01-25: qty 561

## 2022-01-25 MED ORDER — TETANUS-DIPHTH-ACELL PERTUSSIS 5-2.5-18.5 LF-MCG/0.5 IM SUSY
0.5000 mL | PREFILLED_SYRINGE | Freq: Once | INTRAMUSCULAR | Status: AC
  Administered 2022-01-25: 0.5 mL via INTRAMUSCULAR
  Filled 2022-01-25: qty 0.5

## 2022-01-25 MED ORDER — PANTOPRAZOLE SODIUM 40 MG IV SOLR
40.0000 mg | Freq: Every day | INTRAVENOUS | Status: DC
  Administered 2022-01-27: 40 mg via INTRAVENOUS
  Filled 2022-01-25: qty 10

## 2022-01-25 MED ORDER — MORPHINE SULFATE (PF) 4 MG/ML IV SOLN: 4.0000 mg | INTRAVENOUS | Status: DC | PRN

## 2022-01-25 MED ORDER — SODIUM CHLORIDE 0.9% IV SOLUTION: Freq: Once | INTRAVENOUS | Status: DC

## 2022-01-25 MED ORDER — PANTOPRAZOLE SODIUM 40 MG PO TBEC
40.0000 mg | DELAYED_RELEASE_TABLET | Freq: Every day | ORAL | Status: DC
  Administered 2022-01-26: 40 mg via ORAL
  Filled 2022-01-25: qty 1

## 2022-01-25 MED ORDER — HEPARIN (PORCINE) IN NACL 1000-0.9 UT/500ML-% IV SOLN
INTRAVENOUS | Status: AC
  Filled 2022-01-25: qty 1000

## 2022-01-25 MED ORDER — ONDANSETRON 4 MG PO TBDP: 4.0000 mg | ORAL_TABLET | Freq: Four times a day (QID) | ORAL | Status: DC | PRN

## 2022-01-25 MED ORDER — NITROGLYCERIN 0.4 MG SL SUBL: 0.4000 mg | SUBLINGUAL_TABLET | SUBLINGUAL | Status: DC | PRN

## 2022-01-25 MED ORDER — NIACIN ER (ANTIHYPERLIPIDEMIC) 500 MG PO TBCR
500.0000 mg | EXTENDED_RELEASE_TABLET | Freq: Every day | ORAL | Status: DC
  Administered 2022-01-25 – 2022-01-26 (×2): 500 mg via ORAL
  Filled 2022-01-25 (×2): qty 1

## 2022-01-25 MED ORDER — SODIUM CHLORIDE 0.9 % IV SOLN
10.0000 mL/h | Freq: Once | INTRAVENOUS | Status: AC
  Administered 2022-01-25: 10 mL/h via INTRAVENOUS

## 2022-01-25 MED ORDER — POTASSIUM CHLORIDE IN NACL 20-0.9 MEQ/L-% IV SOLN
INTRAVENOUS | Status: DC
  Filled 2022-01-25 (×2): qty 1000

## 2022-01-25 MED ORDER — CALCITRIOL 0.5 MCG PO CAPS
0.5000 ug | ORAL_CAPSULE | Freq: Every day | ORAL | Status: DC
  Administered 2022-01-26: 0.5 ug via ORAL
  Filled 2022-01-25 (×2): qty 1

## 2022-01-25 MED ORDER — OXYCODONE HCL 5 MG PO TABS: 10.0000 mg | ORAL_TABLET | ORAL | Status: DC | PRN

## 2022-01-25 MED ORDER — EPINEPHRINE HCL 5 MG/250ML IV SOLN IN NS
0.5000 ug/min | INTRAVENOUS | Status: DC
  Filled 2022-01-25: qty 250

## 2022-01-25 MED ORDER — HEPARIN SODIUM (PORCINE) 1000 UNIT/ML IJ SOLN
INTRAMUSCULAR | Status: AC
  Filled 2022-01-25: qty 10

## 2022-01-25 MED ORDER — TRAZODONE HCL 50 MG PO TABS
75.0000 mg | ORAL_TABLET | Freq: Every evening | ORAL | Status: DC | PRN
  Administered 2022-01-25: 75 mg via ORAL
  Filled 2022-01-25 (×2): qty 2

## 2022-01-25 MED ORDER — NITROGLYCERIN 1 MG/10 ML FOR IR/CATH LAB
INTRA_ARTERIAL | Status: AC
  Filled 2022-01-25: qty 10

## 2022-01-25 MED ORDER — VERAPAMIL HCL 2.5 MG/ML IV SOLN
INTRAVENOUS | Status: AC
  Filled 2022-01-25: qty 2

## 2022-01-25 SURGICAL SUPPLY — 9 items
GLIDESHEATH SLEND A-KIT 6F 22G (SHEATH) IMPLANT
GUIDEWIRE INQWIRE 1.5J.035X260 (WIRE) IMPLANT
INQWIRE 1.5J .035X260CM (WIRE)
KIT ENCORE 26 ADVANTAGE (KITS) IMPLANT
KIT HEART LEFT (KITS) ×2 IMPLANT
PACK CARDIAC CATHETERIZATION (CUSTOM PROCEDURE TRAY) ×2 IMPLANT
TRANSDUCER W/STOPCOCK (MISCELLANEOUS) ×2 IMPLANT
TUBING CIL FLEX 10 FLL-RA (TUBING) ×2 IMPLANT
WIRE HI TORQ VERSACORE-J 145CM (WIRE) IMPLANT

## 2022-01-25 NOTE — ED Triage Notes (Addendum)
Pt was working on his car and had a mechanical fall. Pt takes elliquis twice a day. Pt did not take elliquis today, last time was last night. Pt denies LOC. Pt is axox4. VSS. Pt got zofran and fentanyl with EMS.   ?

## 2022-01-25 NOTE — ED Notes (Signed)
This RN did not receive a pink trauma sheet for trauma times.  ?

## 2022-01-25 NOTE — TOC CAGE-AID Note (Signed)
Transition of Care (TOC) - CAGE-AID Screening ? ? ?Patient Details  ?Name: [name] [name] ?MRN: [id number] ?Date of Birth: [birth year] ? ?Transition of Care (TOC) CM/SW Contact:    ?Novalyn Lajara C Tarpley-Carter, LCSWA ?Phone Number: ?01/20/2022, 2:49 PM ? ? ?Clinical Narrative: ?Pt is unable to participate in Cage Aid. ?CSW will assess at a better time.  ? ?Passenger transport manager, MSW, LCSW-A ?Pronouns:  She/Her/Hers ?[facility]Transitions of Care ?Clinical Social Worker ?Direct Number:  [phone] ?[email or web address] ? ?CAGE-AID Screening: ?Substance Abuse Screening unable to be completed due to: : Patient unable to participate ? ?  ?  ?  ?  ?  ? ?Substance Abuse Education Offered: No ? ?  ? ? ? ? ? ? ?

## 2022-01-25 NOTE — Progress Notes (Signed)
Report received 

## 2022-01-25 NOTE — Consult Note (Incomplete)
Chief Complaint: Patient was seen in consultation today for renal hemorrhage  Referring Physician(s): Dr. Grandville Silos  Supervising Physician: Sandi Mariscal  Patient Status: [facility] - In-pt  History of Present Illness: [name] [name] is a 81 y.o. male with past medical history of CVA, MI with 3 stents, a fib on eliquis (last dose last pm), CAD, urothelial carcinoma s/p R nephroureterectomy (Dr. [name]), chronic left pleural effusion who suffered a fall in his garage this AM.  Upon arrival to the ED he became hypotensive with acute EKG changes.  CT Abdomen Pelvis was obtained and patient was sent for possible cardiac cath.  CT Abdomen Pelvis showed significant left renal hemorrhage with retroperitoneal bleed. EKG changes attributed to acute blood loss and work-up initiated as trauma.  IR consulted for poss  Past Medical History:  Diagnosis Date   Acute ischemic stroke ([place]) 09/29/2020   Acute myocardial infarction of other inferior wall, episode of care unspecified 03/02/2007   Arthritis    shoulders and ribs    Atrial fibrillation ([place])    atrial fib/ LOV S Weaver PA 04/11/12 EPIC, - CHEST X RAY, EKG 5/13 EPIC   CAD (coronary artery disease)     s/p NSTEMI 04/08;  LHC 02/2007: Proximal LAD 75%, mid LAD 99%, proximal RCA 25%.  PCI:  Cypher DES to the proximal and mid LAD.  Last nuclear study 11/2011 (after a trip to the ED with CP): EF 58%, low risk study with small inferior wall infarct at the mid and basal level, no ischemia.  Last echo 01/2010: Mild LVH, EF 55-60%, mild AI, mild MR, severely dilated LA and RA   Closed fracture of rib of left side with delayed healing 07/25/2019   Contrast dye induced nephropathy    Hx ARF secondary to contrast nephropathy   Dyslipidemia    Dysrhythmia    Elevated blood-pressure reading, without diagnosis of hypertension 11/07/2019   Epididymitis    Gallstones 05/30/2019   History of blood transfusion    Myocardial infarction ([place]) 2008   Pleurisy    2012    Pneumonia    hx of    Renal cyst    Left; 20 cm   Spinal stenosis    Urothelial cancer ([place])    Dr. [name],  skin cancer non melanoma   [place] compression fracture of third lumbar vertebra with routine healing 05/30/2019    Past Surgical History:  Procedure Laterality Date   arm surgery     orif right elbow   CARDIAC CATHETERIZATION  2008   coronary stents      CYSTECTOMY  07/15/2008   CYSTOSCOPY WITH BIOPSY  03/23/2012   Procedure: CYSTOSCOPY WITH BIOPSY;  Surgeon: [name], MD;  Location: [facility];  Service: Urology;  Laterality: Right;   BRUSH BIOPSY RIGHT URETERAL STENT   CYSTOSCOPY WITH URETEROSCOPY  03/23/2012   Procedure: CYSTOSCOPY WITH URETEROSCOPY;  Surgeon: [name], MD;  Location: [facility];  Service: Urology;  Laterality: Right;    **OR Room #8 requested**  C-ARM    CYSTOSCOPY/RETROGRADE/URETEROSCOPY  02/15/2012   Procedure: CYSTOSCOPY/RETROGRADE/URETEROSCOPY;  Surgeon: [name], MD;  Location: [facility];  Service: Urology;  Laterality: Right;  C-ARM   IR THORACENTESIS ASP PLEURAL SPACE W/IMG GUIDE  12/26/2020   IR THORACENTESIS ASP PLEURAL SPACE W/IMG GUIDE  08/03/2021   JOINT REPLACEMENT     Dr. [name] 09-05-18    left pinky finger      tip removed from accident   [place] NEPHROURETERECTOMY Right  2013   TOTAL KNEE ARTHROPLASTY Left 09/05/2018   Procedure: LEFT TOTAL KNEE ARTHROPLASTY;  Surgeon: [name], MD;  Location: [facility];  Service: Orthopedics;  Laterality: Left;  70 mins with abductor block   TOTAL KNEE ARTHROPLASTY Right 04/03/2019   Procedure: RIGHT TOTAL KNEE ARTHROPLASTY;  Surgeon: [name], MD;  Location: [facility];  Service: Orthopedics;  Laterality: Right;  70 mins    Allergies: Contrast media [iodinated contrast media], Iodine-131, Other, and Promethazine  Medications: Prior to Admission medications   Medication Sig Start Date End Date Taking? Authorizing Provider  acetaminophen (TYLENOL) 650 MG CR tablet Take  1,300 mg by mouth every 8 (eight) hours as needed for pain.   Yes [provider]  atorvastatin (LIPITOR) 40 MG tablet Take 1 tablet (40 mg total) by mouth every evening. 01/09/21  Yes Lindell Spar, MD  calcitRIOL (ROCALTROL) 0.5 MCG capsule Take 0.5 mcg by mouth every morning.   Yes [provider]  cholecalciferol (VITAMIN D3) 25 MCG (1000 UNIT) tablet Take 1,000 Units by mouth every morning.   Yes [provider]  ELIQUIS 5 MG TABS tablet TAKE 1 TABLET TWICE DAILY Patient taking differently: Take 5 mg by mouth 2 (two) times daily. 11/16/21  Yes Lindell Spar, MD  furosemide (LASIX) 40 MG tablet Take 1 tablet (40 mg total) by mouth daily. 11/17/21  Yes Lindell Spar, MD  niacin (NIASPAN) 500 MG CR tablet Take 1 tablet (500 mg total) by mouth at bedtime. 04/11/20  Yes [place], [name], MD  nitroGLYCERIN (NITROSTAT) 0.4 MG SL tablet Place 1 tablet (0.4 mg total) under the tongue every 5 (five) minutes as needed for chest pain. 10/21/20  Yes Weaver, Scott T, PA-C  Omega-3 Fatty Acids (FISH OIL) 1200 MG CAPS Take 1,200 mg by mouth every morning.   Yes [provider]  polyvinyl alcohol (ARTIFICIAL TEARS) 1.4 % ophthalmic solution Place 1 drop into both eyes at bedtime.   Yes [provider]  traZODone (DESYREL) 50 MG tablet Take 1.5 tablets (75 mg total) by mouth at bedtime as needed for sleep. Patient taking differently: Take 75 mg by mouth at bedtime. 03/27/21  Yes Lindell Spar, MD  vitamin B-12 (CYANOCOBALAMIN) 500 MCG tablet Take 500 mcg by mouth daily.   Yes [provider]     Family History  Problem Relation Age of Onset   Cancer Mother    Diabetes type II Mother    Hyperlipidemia Mother    Coronary artery disease Father    Heart attack Father     Social History   Socioeconomic History   Marital status: Married    Spouse name: [name]   Number of children: 3   Years of education: 12   Highest education level: Not on file   Occupational History   Occupation: Retired  Tobacco Use   Smoking status: Former    Packs/day: 2.00    Years: 10.00    Pack years: 20.00    Types: Cigarettes, Cigars    Quit date: 11/15/1981    Years since quitting: 40.2   Smokeless tobacco: Never  Vaping Use   Vaping Use: Never used  Substance and Sexual Activity   Alcohol use: Not Currently    Comment: SOCIAL   Drug use: No   Sexual activity: Not Currently  Other Topics Concern   Not on file  Social History Narrative   Lives in [place], [place] with wife. Exercising 3-4x/week.    Social  Determinants of Health   Financial Resource Strain: Low Risk    Difficulty of Paying Living Expenses: Not hard at all  Food Insecurity: No Food Insecurity   Worried About [place] in the Last Year: Never true   Ran Out of Food in the Last Year: Never true  Transportation Needs: No Transportation Needs   Lack of Transportation (Medical): No   Lack of Transportation (Non-Medical): No  Physical Activity: Insufficiently Active   Days of Exercise per Week: 3 days   Minutes of Exercise per Session: 40 min  Stress: No Stress Concern Present   Feeling of Stress : Only a little  Social Connections: Moderately Isolated   Frequency of Communication with Friends and Family: Twice a week   Frequency of Social Gatherings with Friends and Family: Three times a week   Attends Religious Services: Never   Active Member of Clubs or Organizations: No   Attends Archivist Meetings: Never   Marital Status: Married     Review of Systems: A 12 point ROS discussed and pertinent positives are indicated in the HPI above.  All other systems are negative.  Review of Systems  Unable to perform ROS: Acuity of condition   Vital Signs: BP (!) 127/93    Pulse 94    Temp (!) 97.5 F (36.4 C) (Axillary)    Resp (!) 25    Ht '5\' 11"'$  (1.803 m)    Wt 175 lb (79.4 kg)    SpO2 100%    BMI 24.41 kg/m   Physical Exam Vitals and nursing note  reviewed.  Constitutional:      General: He is not in acute distress.    Appearance: Normal appearance. He is ill-appearing.  HENT:     Mouth/Throat:     Mouth: Mucous membranes are moist.     Pharynx: Oropharynx is clear.  Cardiovascular:     Rate and Rhythm: Regular rhythm. Tachycardia present.  Pulmonary:     Effort: Pulmonary effort is normal. No respiratory distress.     Breath sounds: Normal breath sounds.  Abdominal:     General: Abdomen is flat.     Palpations: Abdomen is soft.  Skin:    General: Skin is warm and dry.  Neurological:     General: No focal deficit present.     Mental Status: He is alert and oriented to person, place, and time.     MD Evaluation Airway: WNL Heart: WNL Abdomen: WNL Chest/ Lungs: WNL ASA  Classification: 2 Mallampati/Airway Score: Three   Imaging: CT HEAD WO CONTRAST  Result Date: 02/02/2022 CLINICAL DATA:  [place] with trauma to the head and neck. Anticoagulation. EXAM: CT HEAD WITHOUT CONTRAST TECHNIQUE: Contiguous axial images were obtained from the base of the skull through the vertex without intravenous contrast. RADIATION DOSE REDUCTION: This exam was performed according to the departmental dose-optimization program which includes automated exposure control, adjustment of the mA and/or kV according to patient size and/or use of iterative reconstruction technique. COMPARISON:  09/29/2020 FINDINGS: Brain: Generalized atrophy. Chronic small-vessel ischemic changes of the cerebral hemispheric white matter. No sign of acute infarction, mass lesion, hemorrhage, hydrocephalus or extra-axial collection. Vascular: There is atherosclerotic calcification of the major vessels at the base of the brain. Skull: No skull fracture. Left tripod fracture is present. Consider maxillofacial CT for full evaluation. Sinuses/Orbits: Traumatic fluid filling the left maxillary sinus. No intraorbital injury seen on this study. Orbital floor fracture associated with  the tripod fracture on  the left. Other: None IMPRESSION: Atrophy and chronic small-vessel ischemic changes of the white matter. No acute or traumatic intracranial finding. Tripod fracture of the left zygoma. Maxillofacial CT recommended for full evaluation. Electronically Signed   By: Nelson Chimes M.D.   On: 01/28/2022 10:18   CT CERVICAL SPINE WO CONTRAST  Result Date: 02/12/2022 CLINICAL DATA:  [place] with trauma to the head and neck. EXAM: CT CERVICAL SPINE WITHOUT CONTRAST TECHNIQUE: Multidetector CT imaging of the cervical spine was performed without intravenous contrast. Multiplanar CT image reconstructions were also generated. RADIATION DOSE REDUCTION: This exam was performed according to the departmental dose-optimization program which includes automated exposure control, adjustment of the mA and/or kV according to patient size and/or use of iterative reconstruction technique. COMPARISON:  Radiography 10/16/2019 FINDINGS: Alignment: No malalignment. Skull base and vertebrae: No acute fracture or focal bone lesion. Old anterior loss of height of T1. Bridging anterior osteophytes in the lower cervical and upper thoracic region. Soft tissues and spinal canal: No traumatic soft tissue finding. Disc levels: No significant disc level pathology. Ordinary age related degenerative spondylosis with disc space narrowing, small endplates and mild bulging of the discs. Mild facet osteoarthritis. No apparent compressive bony canal stenosis. Mild foraminal narrowing. Upper chest: Negative Other: None IMPRESSION: No acute or traumatic cervical region finding. Ordinary degenerative spondylosis. Old minor anterior compression deformity at T1. Electronically Signed   By: Nelson Chimes M.D.   On: 01/21/2022 10:20   DG Pelvis Portable  Result Date: 02/04/2022 CLINICAL DATA:  Fall. EXAM: PORTABLE PELVIS 1-2 VIEWS COMPARISON:  CT abdomen pelvis dated July 24, 2019. FINDINGS: There is no evidence of pelvic fracture or  diastasis. Unchanged chondroid lesion in the left intertrochanteric femur, likely an enchondroma. IMPRESSION: 1. No acute osseous abnormality. Electronically Signed   By: Titus Dubin M.D.   On: 01/13/2022 09:49   DG Chest Port 1 View  Result Date: 02/02/2022 CLINICAL DATA:  Fall. EXAM: PORTABLE CHEST 1 VIEW COMPARISON:  Chest x-ray dated October 13, 2021. FINDINGS: The patient is rotated to the left. Unchanged mild cardiomegaly. Unchanged moderate left pleural effusion with adjacent lingula and left lower lobe atelectasis. The right lung is clear. No pneumothorax. No acute osseous abnormality. Large intra-articular bodies in the left glenohumeral joint. IMPRESSION: 1. No new acute abnormality. 2. Unchanged moderate left pleural effusion with adjacent lingula and left lower lobe atelectasis. Electronically Signed   By: Titus Dubin M.D.   On: 02/11/2022 09:45   ABORTED INVASIVE LAB PROCEDURE  Result Date: 01/13/2022 This case was aborted.  CT CHEST ABDOMEN PELVIS WO CONTRAST  Result Date: 02/06/2022 CLINICAL DATA:  [place].  Anticoagulated. EXAM: CT CHEST, ABDOMEN AND PELVIS WITHOUT CONTRAST TECHNIQUE: Multidetector CT imaging of the chest, abdomen and pelvis was performed following the standard protocol without IV contrast. RADIATION DOSE REDUCTION: This exam was performed according to the departmental dose-optimization program which includes automated exposure control, adjustment of the mA and/or kV according to patient size and/or use of iterative reconstruction technique. COMPARISON:  Radiography same day.  CT 12/29/2020. FINDINGS: CT CHEST FINDINGS Cardiovascular: Heart size upper limits of normal. No pericardial fluid. Coronary artery calcification and aortic atherosclerotic calcification are noted. Mediastinum/Nodes: No evidence of mediastinal or hilar mass or lymphadenopathy on this study done without contrast. Lungs/Pleura: Right lung shows minimal atelectasis/scarring at the base but is  otherwise clear. On the left, there is a chronic pleural effusion which is partially loculated inferiorly. There is chronic volume loss in the left lower lobe  seen in association with that. No progressive change since February of 2022, arguing for benign nature. Musculoskeletal: Old minor anterior loss of height at T1. Old healed compression deformity at T12. Solid bridging osteophytes throughout the thoracic region from T2 to L1. Few old healed rib fractures on the left. No acute rib fracture. CT ABDOMEN PELVIS FINDINGS Hepatobiliary: No liver parenchymal abnormality. Numerous gallstones dependent in the gallbladder without CT evidence of acute hepatobiliary disease. Pancreas: Normal, though there is displacement due to the left-sided retroperitoneal hemorrhage. Spleen: Normal.  No splenic injury suspected. Adrenals/Urinary Tract: Right kidney is surgically absent. There is acute retroperitoneal hemorrhage on the left with involvement of the Peri renal space as well as the more general retroperitoneum. The hemorrhage does not appear to be within the psoas muscle. Some of the acute hemorrhage appears to be present within the previously seen large lower pole cyst. The cyst measures maximally 9.8 cm today compared with 9.3 cm in September of 2020. The other blood products also extend into the renal hilar region, though I do not see a discrete renal fracture. Blood products are probably layering dependent within the bladder. Stomach/Bowel: Previous gastric surgery. No acute bowel finding. Displacement by the left retroperitoneal hemorrhage. Vascular/Lymphatic: Aortic atherosclerosis. No aneurysm. IVC is normal. Reproductive: Enlarged prostate. Other: Small amount of free fluid in the pelvic cul-de-sac which does not appear grossly hyperdense. Musculoskeletal: Compression deformity at L3 with loss of height of 50% which is old and healed. No acute lumbosacral or pelvic fracture. IMPRESSION: Large retroperitoneal bleed  on the left. Much of the hemorrhage appears to be within the nonspecific retroperitoneal space, with some apparent hemorrhage within a nearly 10 cm cyst at the lower pole the left kidney and within the left renal hilum and Peri renal space. Fluid tracks throughout the retroperitoneum on the left, including into a left inguinal hernia. I do not see a frank renal fracture. This is particularly concerning given that this is the solitary kidney. There is no abdominal aortic aneurysm. Chronic left pleural effusion with loculation and chronic volume loss in the left lower lobe. Small amount of intraperitoneal fluid, not grossly hyperdense. Cholelithiasis. Blood layering dependent within the bladder. Critical Value/emergent results were called by telephone at the time of interpretation on 02/07/2022 at 10:32 am to provider Godfrey Pick , who verbally acknowledged these results. Electronically Signed   By: Nelson Chimes M.D.   On: 01/24/2022 10:37    Labs:  CBC: Recent Labs    07/31/21 1156 10/22/21 0816 01/22/2022 0920 02/09/2022 0928  WBC 5.7 6.0 15.4*  --   HGB 11.8* 11.8* 10.7* 10.5*  HCT 37.4* 36.7* 33.4* 31.0*  PLT 266 265 317  --     COAGS: Recent Labs    01/20/2022 0920  INR 1.6*    BMP: Recent Labs    07/30/21 1627 10/22/21 0816 01/20/2022 0920 01/20/2022 0928  NA  --  139 139 137  K  --  4.4 3.7 3.6  CL  --  102 102 101  CO2  --  24 24  --   GLUCOSE 89 97 200* 192*  BUN  --  21 21 25*  CALCIUM  --  8.5* 8.6*  --   CREATININE  --  1.31* 1.46* 1.40*  GFRNONAA  --   --  48*  --     LIVER FUNCTION TESTS: Recent Labs    07/30/21 1627 10/22/21 0816 02/08/2022 0920  BILITOT  --  0.7 1.0  AST  --  19 23  ALT  --  11 13  ALKPHOS  --  92 69  PROT 7.0 6.2 6.3*  ALBUMIN 3.6 3.6 2.9*    TUMOR MARKERS: No results for input(s): AFPTM, CEA, CA199, CHROMGRNA in the last 8760 hours.  Assessment and Plan:  ***  Thank you for this interesting consult.  I greatly enjoyed meeting [name] [name] and look forward to participating in their care.  A copy of this report was sent to the requesting provider on this date.  Electronically Signed: Docia Barrier, PA 02/09/2022, 3:08 PM   I spent a total of {New XENM:[id number]} {New Out-Pt:[id number]}  {Established Out-Pt:[id number]} in face to face in clinical consultation, greater than 50% of which was counseling/coordinating care for ***

## 2022-01-25 NOTE — ED Notes (Signed)
Camelia Eng and this Probation officer confirmed with Dr. Doren Custard to give aspirin before the CT scan.  ?

## 2022-01-25 NOTE — ED Provider Notes (Incomplete)
[facility] EMERGENCY DEPARTMENT Provider Note   CSN: [id number] Arrival date & time:        History {Add pertinent medical, surgical, social history, OB history to HPI:1} No chief complaint on file.   [name] [name] is a 81 y.o. male.  HPI Patient presents after ground-level fall.  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  He does remember the fall.  He describes as mechanical in nature and states that he stumbled and could not catch his footing, causing him to fall to the ground.  At that time, he was not utilizing a cane or a walker.  When he fell, he landed on his left side.  EMS was called.  He endorses pain in the left flank/lower rib area.  He also did strike his head and suffered a small abrasion to the lateral canthus of his left eye.  He is on Eliquis and his last dose was last night.  He did have nausea and was given 4 mg of Zofran prior to arrival.  He was also given 100 mcg of fentanyl by EMS.  On arrival, patient endorses continued pain in the area of left flank and lower ribs.  He denies any other areas of discomfort.    Home Medications Prior to Admission medications   Medication Sig Start Date End Date Taking? Authorizing Provider  acetaminophen (TYLENOL) 650 MG CR tablet Take 650 mg by mouth every 8 (eight) hours as needed for pain.    [provider]  atorvastatin (LIPITOR) 40 MG tablet Take 1 tablet (40 mg total) by mouth every evening. 01/09/21   Lindell Spar, MD  calcitRIOL (ROCALTROL) 0.5 MCG capsule Take 0.5 mcg by mouth daily.    [provider]  ELIQUIS 5 MG TABS tablet TAKE 1 TABLET TWICE DAILY 11/16/21   Lindell Spar, MD  furosemide (LASIX) 40 MG tablet Take 1 tablet (40 mg total) by mouth daily. 11/17/21   Lindell Spar, MD  niacin (NIASPAN) 500 MG CR tablet Take 1 tablet (500 mg total) by mouth at bedtime. 04/11/20   [place], [name], MD  nitroGLYCERIN (NITROSTAT) 0.4 MG SL tablet Place 1 tablet (0.4 mg  total) under the tongue every 5 (five) minutes as needed for chest pain. 10/21/20   Richardson Dopp T, PA-C  Omega-3 Fatty Acids (FISH OIL) 1200 MG CAPS Take 1,200 mg by mouth daily.     [provider]  traZODone (DESYREL) 50 MG tablet Take 1.5 tablets (75 mg total) by mouth at bedtime as needed for sleep. 03/27/21   Lindell Spar, MD  vitamin B-12 (CYANOCOBALAMIN) 500 MCG tablet Take 500 mcg by mouth daily.    [provider]      Allergies    Contrast media [iodinated contrast media], Iodine-131, Other, and Promethazine    Review of Systems   Review of Systems  Gastrointestinal:  Positive for nausea.  Genitourinary:  Positive for flank pain.  Skin:  Positive for wound.  All other systems reviewed and are negative.  Physical Exam Updated Vital Signs There were no vitals taken for this visit. Physical Exam Vitals and nursing note reviewed.  Constitutional:      General: He is not in acute distress.    Appearance: Normal appearance. He is well-developed. He is not ill-appearing, toxic-appearing or diaphoretic.  HENT:     Head: Normocephalic and atraumatic.     Right Ear: External ear normal.     Left  Ear: External ear normal.     Nose: Nose normal.     Mouth/Throat:     Mouth: Mucous membranes are moist.     Pharynx: Oropharynx is clear.  Eyes:     General: No scleral icterus.    Extraocular Movements: Extraocular movements intact.     Conjunctiva/sclera: Conjunctivae normal.  Cardiovascular:     Rate and Rhythm: Normal rate.     Heart sounds: No murmur heard. Pulmonary:     Effort: Pulmonary effort is normal. No respiratory distress.     Breath sounds: Normal breath sounds. No wheezing or rales.  Abdominal:     Palpations: Abdomen is soft.     Tenderness: There is abdominal tenderness.  Musculoskeletal:        General: No swelling or deformity.     Cervical back: Normal range of motion and neck supple. No rigidity or tenderness.     Right lower leg:  Edema present.     Left lower leg: Edema present.  Skin:    General: Skin is warm and dry.     Capillary Refill: Capillary refill takes less than 2 seconds.     Coloration: Skin is not jaundiced or pale.  Neurological:     General: No focal deficit present.     Mental Status: He is alert and oriented to person, place, and time.     Cranial Nerves: No cranial nerve deficit.     Sensory: No sensory deficit.     Motor: No weakness.     Coordination: Coordination normal.  Psychiatric:        Mood and Affect: Mood normal.        Behavior: Behavior normal.        Thought Content: Thought content normal.        Judgment: Judgment normal.    ED Results / Procedures / Treatments   Labs (all labs ordered are listed, but only abnormal results are displayed) Labs Reviewed - No data to display  EKG None  Radiology No results found.  Procedures Procedures  {Document cardiac monitor, telemetry assessment procedure when appropriate:1}  Medications Ordered in ED Medications - No data to display  ED Course/ Medical Decision Making/ A&P                           Medical Decision Making Amount and/or Complexity of Data Reviewed Labs: ordered. Radiology: ordered. ECG/medicine tests: ordered.  Risk Prescription drug management.   This patient presents to the ED for concern of fall, this involves an extensive number of treatment options, and is a complaint that carries with it a high risk of complications and morbidity.  The differential diagnosis includes acute injuries including ICH, left rib fracture, splenic injury, lung contusion, ACS.   Co morbidities that complicate the patient evaluation  CAD, CKD, GERD, arthritis, atrial fibrillation, CVA, HTN, BPH, anemia.   Additional history obtained:  Additional history obtained from EMS External records from outside source obtained and reviewed including EMR   Lab Tests:  I Ordered, and personally interpreted labs.  The  pertinent results include:  ***   Imaging Studies ordered:  I ordered imaging studies including ***  I independently visualized and interpreted imaging which showed *** I agree with the radiologist interpretation   Cardiac Monitoring:  The patient was maintained on a cardiac monitor.  I personally viewed and interpreted the cardiac monitored which showed an underlying rhythm of: ***   Medicines  ordered and prescription drug management:  I ordered medication including ***  for ***  Reevaluation of the patient after these medicines showed that the patient {resolved/improved/worsened:23923::"improved"} I have reviewed the patients home medicines and have made adjustments as needed   Test Considered:  ***   Critical Interventions:  ***   Consultations Obtained:  I requested consultation with the ***,  and discussed lab and imaging findings as well as pertinent plan - they recommend: ***   Problem List / ED Course:  81 year old male presenting after a ground-level fall.  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  He describes it as losing his balance causing him to stumble and fall.  He landed on his left side and has since had pain in the area of his left lower ribs and left flank.  He is on Eliquis (last dose was last night), and did strike his head.  He has an evidence of a small abrasion to the lateral canthus of his left eye.  Per chart review, last tetanus shot appears to be 2016.  Tetanus was updated.  Patient did receive IV Zofran and fentanyl prior to arrival.  As needed fentanyl was ordered for continued analgesia.  Trauma scans were ordered.  In the past, patient has had kidney injury from IV contrast.  Noncontrasted studies were ordered.  Given the location of the patient's pain in addition to his history of CAD, EKG and troponins were ordered.  EKG shows***.    Social Determinants of Health:  ***     {Document critical care time when  appropriate:1} {Document review of labs and clinical decision tools ie heart score, Chads2Vasc2 etc:1}  {Document your independent review of radiology images, and any outside records:1} {Document your discussion with family members, caretakers, and with consultants:1} {Document social determinants of health affecting pt's care:1} {Document your decision making why or why not admission, treatments were needed:1} Final Clinical Impression(s) / ED Diagnoses Final diagnoses:  None    Rx / DC Orders ED Discharge Orders     None

## 2022-01-25 NOTE — H&P (Signed)
[name] [name] [birth year]  [id number].    Chief Complaint/Reason for Consult: GLF, left renal hemorrhage  HPI:  This is an 81 yo white male with a history of CVA, MI with 3 stents, a fib on eliquis (last dose last pm), CAD, urothelial carcinoma s/p R nephroureterectomy (Dr. [name]), chronic left pleural effusion, who was walking in his garage this morning and stumbled and fell and hit his left flank.  He denies any LOC, hitting his head, dizziness, vertigo, or syncopal symptoms.  He was brought in to the ED where he was initially normotensive and then became hypotensive.  His EKG was initially concerning for a STEMI and cardiology was called.  While his CT scans were pending he was taken to the cath lab for an emergent cardiac cath.  However, just before starting, his CT reads returned showing significant L renal hemorrhage with a large cyst full of blood and RTP bleeding.   His cath was not started and he would brought back down to the ED.  His ST elevation was now thought to be secondary to demand ischemia from acute blood loss.  He was given 2/2 due to this finding and his hypotension.  He was activated as a level 1 trauma at this time.    ROS: ROS: Please see HPI, otherwise all other systems have been reviewed and are negative.  Family History  Problem Relation Age of Onset   Cancer Mother    Diabetes type II Mother    Hyperlipidemia Mother    Coronary artery disease Father    Heart attack Father     Past Medical History:  Diagnosis Date   Acute ischemic stroke ([name]) 09/29/2020   Acute myocardial infarction of other inferior wall, episode of care unspecified 03/02/2007   Arthritis    shoulders and ribs    Atrial fibrillation ([place])    atrial fib/ LOV S Weaver PA 04/11/12 EPIC, - CHEST X RAY, EKG 5/13 EPIC   CAD (coronary artery disease)     s/p NSTEMI 04/08;  [place] 02/2007: Proximal LAD 75%, mid LAD 99%, proximal RCA 25%.  PCI:  Cypher DES to the proximal and mid LAD.  Last nuclear  study 11/2011 (after a trip to the ED with CP): EF 58%, low risk study with small inferior wall infarct at the mid and basal level, no ischemia.  Last echo 01/2010: Mild LVH, EF 55-60%, mild AI, mild MR, severely dilated LA and RA   Closed fracture of rib of left side with delayed healing 07/25/2019   Contrast dye induced nephropathy    Hx ARF secondary to contrast nephropathy   Dyslipidemia    Dysrhythmia    Elevated blood-pressure reading, without diagnosis of hypertension 11/07/2019   Epididymitis    Gallstones 05/30/2019   History of blood transfusion    Myocardial infarction ([name] [name]) 2008   Pleurisy    2012   Pneumonia    hx of    Renal cyst    Left; 20 cm   Spinal stenosis    Urothelial cancer ([name] [name])    Dr. [name],  skin cancer non melanoma   [place] compression fracture of third lumbar vertebra with routine healing 05/30/2019    Past Surgical History:  Procedure Laterality Date   arm surgery     orif right elbow   CARDIAC CATHETERIZATION  2008   coronary stents      CYSTECTOMY  07/15/2008   CYSTOSCOPY WITH BIOPSY  03/23/2012   Procedure: CYSTOSCOPY WITH  BIOPSY;  Surgeon: [name], MD;  Location: [facility];  Service: Urology;  Laterality: Right;   BRUSH BIOPSY RIGHT URETERAL STENT   CYSTOSCOPY WITH URETEROSCOPY  03/23/2012   Procedure: CYSTOSCOPY WITH URETEROSCOPY;  Surgeon: [name], MD;  Location: [facility];  Service: Urology;  Laterality: Right;    **OR Room #8 requested**  C-ARM    CYSTOSCOPY/RETROGRADE/URETEROSCOPY  02/15/2012   Procedure: CYSTOSCOPY/RETROGRADE/URETEROSCOPY;  Surgeon: [name], MD;  Location: [facility];  Service: Urology;  Laterality: Right;  C-ARM   IR THORACENTESIS ASP PLEURAL SPACE W/IMG GUIDE  12/26/2020   IR THORACENTESIS ASP PLEURAL SPACE W/IMG GUIDE  08/03/2021   JOINT REPLACEMENT     Dr. [name] 09-05-18    left pinky finger      tip removed from accident   [place] NEPHROURETERECTOMY Right 2013   TOTAL KNEE ARTHROPLASTY Left  09/05/2018   Procedure: LEFT TOTAL KNEE ARTHROPLASTY;  Surgeon: [name], MD;  Location: [facility];  Service: Orthopedics;  Laterality: Left;  70 mins with abductor block   TOTAL KNEE ARTHROPLASTY Right 04/03/2019   Procedure: RIGHT TOTAL KNEE ARTHROPLASTY;  Surgeon: [name], MD;  Location: [facility];  Service: Orthopedics;  Laterality: Right;  70 mins    Social History:  reports that he quit smoking about 40 years ago. His smoking use included cigarettes and cigars. He has a 20.00 pack-year smoking history. He has never used smokeless tobacco. He reports that he does not currently use alcohol. He reports that he does not use drugs.  Allergies:  Allergies  Allergen Reactions   Contrast Media [Iodinated Contrast Media]     Contrast-induced nephropathy requiring dialysis in 02/2007.   Iodine-131 Other (See Comments)    Kidneys shut down   Other Other (See Comments)    Beta blockers cause bradycardia   Promethazine Other (See Comments)    Pt became drowsy and mild altered mental status    (Not in a hospital admission)    Physical Exam: Blood pressure 102/74, pulse 93, temperature 97.6 F (36.4 C), temperature source Oral, resp. rate (!) 29, height '5\' 11"'$  (1.803 m), weight 79.4 kg, SpO2 98 %. General: pleasant, WD, WN, elderly white male who is laying in bed in NAD, but is shaking uncontrollably secondary to being cold and anxiety HEENT: head is normocephalic, atraumatic, except a small abrasion over his left lateral brow ridge.  Sclera are noninjected.  PERRL.  Ears and nose without any masses or lesions.  Mouth is pink and moist. Neck: soft, nontender, moves with issues. Heart:monitor at times shows a fib.  Patient is shaking so severely that I can not hear heart sounds.  Unable to feel radial pulses due to shaking as well as pedal pulses. Lungs: CTAB, no wheezes, rhonchi, or rales noted.  Respiratory effort nonlabored.  Supplemental [place] in place. Abd: soft, tender in LLQ and Left  flank with fullness in LLQ and hematoma type mass noted in L posterior flank.  No ecchymosis seen thought.  +BS, ND, no masses, L inguinal hernia reducible. GU: gross hematuria noted at urethral meatus.  Foley placed with gross hematuria MS: all 4 extremities are symmetrical with no cyanosis, clubbing, or edema.  Skin: warm and dry with no masses, lesions, or rashes.  Chronic venous stasis changes to his BLEs. Neuro: Cranial nerves 2-12 grossly intact, sensation is normal throughout Psych: A&Ox3 with an appropriate affect.   Results for orders placed or performed during the hospital encounter of 01/30/2022 (from the past 48  hour(s))  Resp Panel by RT-PCR (Flu A&B, Covid) Nasopharyngeal Swab     Status: None   Collection Time: 01/24/2022  9:16 AM   Specimen: Nasopharyngeal Swab; Nasopharyngeal(NP) swabs in vial transport medium  Result Value Ref Range   SARS Coronavirus 2 by RT PCR NEGATIVE NEGATIVE    Comment: (NOTE) SARS-CoV-2 target nucleic acids are NOT DETECTED.  The SARS-CoV-2 RNA is generally detectable in upper respiratory specimens during the acute phase of infection. The lowest concentration of SARS-CoV-2 viral copies this assay can detect is 138 copies/mL. A negative result does not preclude SARS-Cov-2 infection and should not be used as the sole basis for treatment or other patient management decisions. A negative result may occur with  improper specimen collection/handling, submission of specimen other than nasopharyngeal swab, presence of viral mutation(s) within the areas targeted by this assay, and inadequate number of viral copies(<138 copies/mL). A negative result must be combined with clinical observations, patient history, and epidemiological information. The expected result is Negative.  Fact Sheet for Patients:  EntrepreneurPulse.com.au  Fact Sheet for Healthcare Providers:  IncredibleEmployment.be  This test is no t yet approved  or cleared by the [place] FDA and  has been authorized for detection and/or diagnosis of SARS-CoV-2 by FDA under an Emergency Use Authorization (EUA). This EUA will remain  in effect (meaning this test can be used) for the duration of the COVID-19 declaration under Section 564(b)(1) of the Act, 21 U.S.C.section 360bbb-3(b)(1), unless the authorization is terminated  or revoked sooner.       Influenza A by PCR NEGATIVE NEGATIVE   Influenza B by PCR NEGATIVE NEGATIVE    Comment: (NOTE) The Xpert Xpress SARS-CoV-2/FLU/RSV plus assay is intended as an aid in the diagnosis of influenza from Nasopharyngeal swab specimens and should not be used as a sole basis for treatment. Nasal washings and aspirates are unacceptable for Xpert Xpress SARS-CoV-2/FLU/RSV testing.  Fact Sheet for Patients: EntrepreneurPulse.com.au  Fact Sheet for Healthcare Providers: IncredibleEmployment.be  This test is not yet approved or cleared by the [place] FDA and has been authorized for detection and/or diagnosis of SARS-CoV-2 by FDA under an Emergency Use Authorization (EUA). This EUA will remain in effect (meaning this test can be used) for the duration of the COVID-19 declaration under Section 564(b)(1) of the Act, 21 U.S.C. section 360bbb-3(b)(1), unless the authorization is terminated or revoked.  Performed at [facility], [place] [address] [place], [place] [address]   Comprehensive metabolic panel     Status: Abnormal   Collection Time: 01/18/2022  9:20 AM  Result Value Ref Range   Sodium 139 135 - 145 mmol/L   Potassium 3.7 3.5 - 5.1 mmol/L   Chloride 102 98 - 111 mmol/L   CO2 24 22 - 32 mmol/L   Glucose, Bld 200 (H) 70 - 99 mg/dL    Comment: Glucose reference range applies only to samples taken after fasting for at least 8 hours.   BUN 21 8 - 23 mg/dL   Creatinine, Ser 1.46 (H) 0.61 - 1.24 mg/dL   Calcium 8.6 (L) 8.9 - 10.3 mg/dL   Total  Protein 6.3 (L) 6.5 - 8.1 g/dL   Albumin 2.9 (L) 3.5 - 5.0 g/dL   AST 23 15 - 41 U/L   ALT 13 0 - 44 U/L   Alkaline Phosphatase 69 38 - 126 U/L   Total Bilirubin 1.0 0.3 - 1.2 mg/dL   GFR, Estimated 48 (L) >60 mL/min    Comment: (  NOTE) Calculated using the CKD-EPI Creatinine Equation (2021)    Anion gap 13 5 - 15    Comment: Performed at [facility], [place] [address] [place], [place] [address]  CBC     Status: Abnormal   Collection Time: 01/31/2022  9:20 AM  Result Value Ref Range   WBC 15.4 (H) 4.0 - 10.5 K/uL   RBC 3.53 (L) 4.22 - 5.81 MIL/uL   Hemoglobin 10.7 (L) 13.0 - 17.0 g/dL   HCT 33.4 (L) 39.0 - 52.0 %   MCV 94.6 80.0 - 100.0 fL   MCH 30.3 26.0 - 34.0 pg   MCHC 32.0 30.0 - 36.0 g/dL   RDW 14.0 11.5 - 15.5 %   Platelets 317 150 - 400 K/uL   nRBC 0.0 0.0 - 0.2 %    Comment: Performed at [facility], [place] [address] [place], [place] [address]  Lactic acid, plasma     Status: Abnormal   Collection Time: 02/02/2022  9:20 AM  Result Value Ref Range   Lactic Acid, Venous 4.5 (HH) 0.5 - 1.9 mmol/L    Comment: CRITICAL RESULT CALLED TO, READ BACK BY AND VERIFIED WITH: T.CLOSSON,RN 1015 02/08/2022 CLARK,S Performed at [facility], [place] [address], [place], [place] [address]   Protime-INR     Status: Abnormal   Collection Time: 01/20/2022  9:20 AM  Result Value Ref Range   Prothrombin Time 19.4 (H) 11.4 - 15.2 seconds   INR 1.6 (H) 0.8 - 1.2    Comment: (NOTE) INR goal varies based on device and disease states. Performed at [facility], [place] [address] [place], [place] [address]   Magnesium     Status: None   Collection Time: 02/09/2022  9:20 AM  Result Value Ref Range   Magnesium 1.8 1.7 - 2.4 mg/dL    Comment: Performed at [facility] [address], [place], [place] [address]  Troponin I (High Sensitivity)     Status: Abnormal   Collection Time: 02/04/2022  9:20 AM  Result Value Ref Range   Troponin I (High Sensitivity) 23 (H) <18 ng/L     Comment: (NOTE) Elevated high sensitivity troponin I (hsTnI) values and significant  changes across serial measurements may suggest ACS but many other  chronic and acute conditions are known to elevate hsTnI results.  Refer to the "Links" section for chest pain algorithms and additional  guidance. Performed at [facility], [place] [address], [place], [place] [address]   I-Stat Chem 8, ED     Status: Abnormal   Collection Time: 02/04/2022  9:28 AM  Result Value Ref Range   Sodium 137 135 - 145 mmol/L   Potassium 3.6 3.5 - 5.1 mmol/L   Chloride 101 98 - 111 mmol/L   BUN 25 (H) 8 - 23 mg/dL   Creatinine, Ser 1.40 (H) 0.61 - 1.24 mg/dL   Glucose, Bld 192 (H) 70 - 99 mg/dL    Comment: Glucose reference range applies only to samples taken after fasting for at least 8 hours.   Calcium, Ion 1.06 (L) 1.15 - 1.40 mmol/L   TCO2 25 22 - 32 mmol/L   Hemoglobin 10.5 (L) 13.0 - 17.0 g/dL   HCT 31.0 (L) 39.0 - 52.0 %  Prepare RBC (crossmatch)     Status: None   Collection Time: 01/20/2022 11:14 AM  Result Value Ref Range   Order Confirmation      ORDER PROCESSED BY  BLOOD BANK Performed at [facility], [place] [address] [place], [place] [address]   Type and screen [place]     Status: None (Preliminary result)   Collection Time: 01/24/2022 11:21 AM  Result Value Ref Range   ABO/RH(D) B POS    Antibody Screen PENDING    Sample Expiration      01/28/2022,2359 Performed at [facility], [place] [address], [place], [place] [address]    CT HEAD WO CONTRAST  Result Date: 01/24/2022 CLINICAL DATA:  [place] with trauma to the head and neck. Anticoagulation. EXAM: CT HEAD WITHOUT CONTRAST TECHNIQUE: Contiguous axial images were obtained from the base of the skull through the vertex without intravenous contrast. RADIATION DOSE REDUCTION: This exam was performed according to the departmental dose-optimization program which includes automated exposure control, adjustment of the mA  and/or kV according to patient size and/or use of iterative reconstruction technique. COMPARISON:  09/29/2020 FINDINGS: Brain: Generalized atrophy. Chronic small-vessel ischemic changes of the cerebral hemispheric white matter. No sign of acute infarction, mass lesion, hemorrhage, hydrocephalus or extra-axial collection. Vascular: There is atherosclerotic calcification of the major vessels at the base of the brain. Skull: No skull fracture. Left tripod fracture is present. Consider maxillofacial CT for full evaluation. Sinuses/Orbits: Traumatic fluid filling the left maxillary sinus. No intraorbital injury seen on this study. Orbital floor fracture associated with the tripod fracture on the left. Other: None IMPRESSION: Atrophy and chronic small-vessel ischemic changes of the white matter. No acute or traumatic intracranial finding. Tripod fracture of the left zygoma. Maxillofacial CT recommended for full evaluation. Electronically Signed   By: Nelson Chimes M.D.   On: 01/18/2022 10:18   CT CERVICAL SPINE WO CONTRAST  Result Date: 02/11/2022 CLINICAL DATA:  [place] with trauma to the head and neck. EXAM: CT CERVICAL SPINE WITHOUT CONTRAST TECHNIQUE: Multidetector CT imaging of the cervical spine was performed without intravenous contrast. Multiplanar CT image reconstructions were also generated. RADIATION DOSE REDUCTION: This exam was performed according to the departmental dose-optimization program which includes automated exposure control, adjustment of the mA and/or kV according to patient size and/or use of iterative reconstruction technique. COMPARISON:  Radiography 10/16/2019 FINDINGS: Alignment: No malalignment. Skull base and vertebrae: No acute fracture or focal bone lesion. Old anterior loss of height of T1. Bridging anterior osteophytes in the lower cervical and upper thoracic region. Soft tissues and spinal canal: No traumatic soft tissue finding. Disc levels: No significant disc level pathology.  Ordinary age related degenerative spondylosis with disc space narrowing, small endplates and mild bulging of the discs. Mild facet osteoarthritis. No apparent compressive bony canal stenosis. Mild foraminal narrowing. Upper chest: Negative Other: None IMPRESSION: No acute or traumatic cervical region finding. Ordinary degenerative spondylosis. Old minor anterior compression deformity at T1. Electronically Signed   By: Nelson Chimes M.D.   On: 01/13/2022 10:20   DG Pelvis Portable  Result Date: 02/12/2022 CLINICAL DATA:  [place]. EXAM: PORTABLE PELVIS 1-2 VIEWS COMPARISON:  CT abdomen pelvis dated July 24, 2019. FINDINGS: There is no evidence of pelvic fracture or diastasis. Unchanged chondroid lesion in the left intertrochanteric femur, likely an enchondroma. IMPRESSION: 1. No acute osseous abnormality. Electronically Signed   By: Titus Dubin M.D.   On: 02/03/2022 09:49   DG Chest Port 1 View  Result Date: 01/18/2022 CLINICAL DATA:  [place]. EXAM: PORTABLE CHEST 1 VIEW COMPARISON:  Chest x-ray dated October 13, 2021. FINDINGS: The patient is rotated to the left. Unchanged mild cardiomegaly.  Unchanged moderate left pleural effusion with adjacent lingula and left lower lobe atelectasis. The right lung is clear. No pneumothorax. No acute osseous abnormality. Large intra-articular bodies in the left glenohumeral joint. IMPRESSION: 1. No new acute abnormality. 2. Unchanged moderate left pleural effusion with adjacent lingula and left lower lobe atelectasis. Electronically Signed   By: Titus Dubin M.D.   On: 02/03/2022 09:45   ABORTED INVASIVE LAB PROCEDURE  Result Date: 02/04/2022 This case was aborted.  CT CHEST ABDOMEN PELVIS WO CONTRAST  Result Date: 01/22/2022 CLINICAL DATA:  [place].  Anticoagulated. EXAM: CT CHEST, ABDOMEN AND PELVIS WITHOUT CONTRAST TECHNIQUE: Multidetector CT imaging of the chest, abdomen and pelvis was performed following the standard protocol without IV contrast. RADIATION  DOSE REDUCTION: This exam was performed according to the departmental dose-optimization program which includes automated exposure control, adjustment of the mA and/or kV according to patient size and/or use of iterative reconstruction technique. COMPARISON:  Radiography same day.  CT 12/29/2020. FINDINGS: CT CHEST FINDINGS Cardiovascular: Heart size upper limits of normal. No pericardial fluid. Coronary artery calcification and aortic atherosclerotic calcification are noted. Mediastinum/Nodes: No evidence of mediastinal or hilar mass or lymphadenopathy on this study done without contrast. Lungs/Pleura: Right lung shows minimal atelectasis/scarring at the base but is otherwise clear. On the left, there is a chronic pleural effusion which is partially loculated inferiorly. There is chronic volume loss in the left lower lobe seen in association with that. No progressive change since February of 2022, arguing for benign nature. Musculoskeletal: Old minor anterior loss of height at T1. Old healed compression deformity at T12. Solid bridging osteophytes throughout the thoracic region from T2 to L1. Few old healed rib fractures on the left. No acute rib fracture. CT ABDOMEN PELVIS FINDINGS Hepatobiliary: No liver parenchymal abnormality. Numerous gallstones dependent in the gallbladder without CT evidence of acute hepatobiliary disease. Pancreas: Normal, though there is displacement due to the left-sided retroperitoneal hemorrhage. Spleen: Normal.  No splenic injury suspected. Adrenals/Urinary Tract: Right kidney is surgically absent. There is acute retroperitoneal hemorrhage on the left with involvement of the Peri renal space as well as the more general retroperitoneum. The hemorrhage does not appear to be within the psoas muscle. Some of the acute hemorrhage appears to be present within the previously seen large lower pole cyst. The cyst measures maximally 9.8 cm today compared with 9.3 cm in September of 2020. The  other blood products also extend into the renal hilar region, though I do not see a discrete renal fracture. Blood products are probably layering dependent within the bladder. Stomach/Bowel: Previous gastric surgery. No acute bowel finding. Displacement by the left retroperitoneal hemorrhage. Vascular/Lymphatic: Aortic atherosclerosis. No aneurysm. IVC is normal. Reproductive: Enlarged prostate. Other: Small amount of free fluid in the pelvic cul-de-sac which does not appear grossly hyperdense. Musculoskeletal: Compression deformity at L3 with loss of height of 50% which is old and healed. No acute lumbosacral or pelvic fracture. IMPRESSION: Large retroperitoneal bleed on the left. Much of the hemorrhage appears to be within the nonspecific retroperitoneal space, with some apparent hemorrhage within a nearly 10 cm cyst at the lower pole the left kidney and within the left renal hilum and Peri renal space. Fluid tracks throughout the retroperitoneum on the left, including into a left inguinal hernia. I do not see a frank renal fracture. This is particularly concerning given that this is the solitary kidney. There is no abdominal aortic aneurysm. Chronic left pleural effusion with loculation and chronic volume loss in the  left lower lobe. Small amount of intraperitoneal fluid, not grossly hyperdense. Cholelithiasis. Blood layering dependent within the bladder. Critical Value/emergent results were called by telephone at the time of interpretation on 01/20/2022 at 10:32 am to provider Godfrey Pick , who verbally acknowledged these results. Electronically Signed   By: Nelson Chimes M.D.   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  2/2 pRBCs/FFP given along with KCentra for reversal.  Discussed with IR who did not want to proceed with embolization given this is patient's only kidney and risk for renal failure or  compromise.  Will treat conservatively for now and monitor his labs closely.  We have discussed with urology, Dr. Cain Sieve, who will be on board as well.  Nothing acutely surgical to do at this time.  Bedrest for now.  Monitor BMET ABL anemia - secondary to above.  Monitor cbcs q 6 hrs Elevated ST/demand ischemia - initially concerned about STEMI, but given bleed and hypotension secondary to bleed, felt to be demand ischemia and not STEMI.  Cardiology following.  Likely will need echo.  D/w Dr. Burt Knack H/O a fib - eliquis on hold H/O CAD, MI, stents H/O urothelial carcinoma - s/p R nephroureterectomy, Dr. [name] 2013 H/O CVA Chronic left pleural effusion- required prior thoracenteses in past Mild acute on CKD - cr 1.46.  baseline appears around 1.3.  will monitor Hematuria- secondary to above.  Foley in place 3/13.  UA pending FEN - npo/IVFs VTE - on hold due to above, SCDs ID - none currently needed Admit - inpatient to ICU   I reviewed ED provider notes, Consultant   notes, last 24 h vitals and pain scores, last 48 h intake and output, last 24 h labs and trends, and last 24 h imaging results.  Henreitta Cea, [facility] 01/18/2022, 11:56 AM Please see Amion for pager number during day hours 7:00am-4:30pm or 7:00am -11:30am on weekends

## 2022-01-25 NOTE — Progress Notes (Signed)
?   01/30/2022 1125  ?Clinical Encounter Type  ?Visited With Patient not available;Family  ?Visit Type Initial;Social support;Trauma  ?Referral From Nurse  ?Consult/Referral To None  ? ?Chaplain responded to alert for level one trauma.  ?Requested to bring family members from [facility] waiting area. Patient had been up to the cath lab for a procedure.  Brought family member to ED and he began calling family as he waited for an update.  ?Family member went outside and has not returned. 1215. ?I advised nurse that if a chaplain is requested someone will return.  ? ?Danice Goltz ?Chaplain Resident  ?[facility]  ?[phone] ? ?

## 2022-01-25 NOTE — Progress Notes (Signed)
Pts belongings are his clothes only.  ?

## 2022-01-25 NOTE — ED Notes (Signed)
2nd unit emergent blood tranfused, blood bank called stating do not give platelets from ED freezer, they are thawing platelets for pt at this time. Pharmacist stated she was working on Pensions consultant from [employer]. Pt resting in stretcher in no acute distress. Foley insertion at this time. ?

## 2022-01-25 NOTE — Consult Note (Signed)
Urology Consult   Reason for consult: retroperitoneal hematoma, solitary kidney  History of Present Illness: [place] is a 81 y.o. with a retroperitoneal hematoma in the setting of solitary L kidney  Mr Gerstel presented to the ED this morning after a fall in which he struck his head. Imaging was obtained in which A left retroperitoneal hematoma and hemorrhagic cyst were identified. The patient was also found to have elevated troponins and cardiology has been consulted. They were planning to cath the patient but once his imaging findings demonstrated the hematoma this was discontinued.   Creatinine on presentation was 1.4 which appears to be approximately his baseline since last year.  His hemoglobin is 10.5 (baseline close to 12). After this lab draw he was transfused 2 units PRBcs. A FFP transfusion is upcoming.   Mr. Zollner is a known patient of Dr. 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 He had a right NephU in 2013. He has a known left renal cyst.  Past Medical History:  Diagnosis Date   Acute ischemic stroke ([place]) 09/29/2020   Acute myocardial infarction of other inferior wall, episode of care unspecified 03/02/2007   Arthritis    shoulders and ribs    Atrial fibrillation ([place])    atrial fib/ LOV S Weaver PA 04/11/12 EPIC, - CHEST X RAY, EKG 5/13 EPIC   CAD (coronary artery disease)     s/p NSTEMI 04/08;  LHC 02/2007: Proximal LAD 75%, mid LAD 99%, proximal RCA 25%.  PCI:  Cypher DES to the proximal and mid LAD.  Last nuclear study 11/2011 (after a trip to the ED with CP): EF 58%, low risk study with small inferior wall infarct at the mid and basal level, no ischemia.  Last echo 01/2010: Mild LVH, EF 55-60%, mild AI, mild MR, severely dilated LA and RA   Closed fracture of rib of left side with delayed healing 07/25/2019   Contrast dye induced nephropathy    Hx ARF secondary to contrast nephropathy   Dyslipidemia     Dysrhythmia    Elevated blood-pressure reading, without diagnosis of hypertension 11/07/2019   Epididymitis    Gallstones 05/30/2019   History of blood transfusion    Myocardial infarction ([place]) 2008   Pleurisy    2012   Pneumonia    hx of    Renal cyst    Left; 20 cm   Spinal stenosis    Urothelial cancer ([place])    Dr. [name],  skin cancer non melanoma   Wedge compression fracture of third lumbar vertebra with routine healing 05/30/2019    Past Surgical History:  Procedure Laterality Date   arm surgery     orif right elbow   CARDIAC CATHETERIZATION  2008   coronary stents      CYSTECTOMY  07/15/08   CYSTOSCOPY WITH BIOPSY  03/23/2012   Procedure: CYSTOSCOPY WITH BIOPSY;  Surgeon: [name], MD;  Location: [facility];  Service: Urology;  Laterality: Right;   BRUSH BIOPSY RIGHT URETERAL STENT   CYSTOSCOPY WITH URETEROSCOPY  03/23/2012   Procedure: CYSTOSCOPY WITH URETEROSCOPY;  Surgeon: [name], MD;  Location: [facility];  Service: Urology;  Laterality: Right;    **OR Room #8 requested**  C-ARM    CYSTOSCOPY/RETROGRADE/URETEROSCOPY  02/15/2012   Procedure: CYSTOSCOPY/RETROGRADE/URETEROSCOPY;  Surgeon: [name], MD;  Location: [facility];  Service: Urology;  Laterality: Right;  C-ARM   IR THORACENTESIS ASP PLEURAL SPACE  W/IMG GUIDE  12/26/2020   IR THORACENTESIS ASP PLEURAL SPACE W/IMG GUIDE  08/03/2021   JOINT REPLACEMENT     Dr. 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   Procedure: LEFT TOTAL KNEE ARTHROPLASTY;  Surgeon: [name], MD;  Location: [facility];  Service: Orthopedics;  Laterality: Left;  70 mins with abductor block   TOTAL KNEE ARTHROPLASTY Right 04/03/2019   Procedure: RIGHT TOTAL KNEE ARTHROPLASTY;  Surgeon: [name], MD;  Location: [facility];  Service: Orthopedics;  Laterality: Right;  70 mins   [facility] Medications:  Home Meds:   No current facility-administered medications on file prior to encounter.   Current Outpatient Medications on File Prior to Encounter  Medication Sig Dispense Refill   acetaminophen (TYLENOL) 650 MG CR tablet Take 650 mg by mouth every 8 (eight) hours as needed for pain.     atorvastatin (LIPITOR) 40 MG tablet Take 1 tablet (40 mg total) by mouth every evening. 90 tablet 3   calcitRIOL (ROCALTROL) 0.5 MCG capsule Take 0.5 mcg by mouth daily.     ELIQUIS 5 MG TABS tablet TAKE 1 TABLET TWICE DAILY 180 tablet 1   furosemide (LASIX) 40 MG tablet Take 1 tablet (40 mg total) by mouth daily. 90 tablet 3   niacin (NIASPAN) 500 MG CR tablet Take 1 tablet (500 mg total) by mouth at bedtime.     nitroGLYCERIN (NITROSTAT) 0.4 MG SL tablet Place 1 tablet (0.4 mg total) under the tongue every 5 (five) minutes as needed for chest pain. 25 tablet 4   Omega-3 Fatty Acids (FISH OIL) 1200 MG CAPS Take 1,200 mg by mouth daily.      traZODone (DESYREL) 50 MG tablet Take 1.5 tablets (75 mg total) by mouth at bedtime as needed for sleep. 135 tablet 3   vitamin B-12 (CYANOCOBALAMIN) 500 MCG tablet Take 500 mcg by mouth daily.       Scheduled Meds: Continuous Infusions:  epinephrine     prothrombin complex conc human (Kcentra) IVPB     PRN Meds:.fentaNYL (SUBLIMAZE) injection  Allergies:  Allergies  Allergen Reactions   Contrast Media [Iodinated Contrast Media]     Contrast-induced nephropathy requiring dialysis in 02/2007.   Iodine-131 Other (See Comments)    Kidneys shut down   Other Other (See Comments)    Beta blockers cause bradycardia   Promethazine Other (See Comments)    Pt became drowsy and mild altered mental status    Family History  Problem Relation Age of Onset   Cancer Mother    Diabetes type II Mother    Hyperlipidemia Mother    Coronary artery disease Father    Heart attack Father     Social History:  reports that he quit smoking about 40 years ago. His smoking use included  cigarettes and cigars. He has a 20.00 pack-year smoking history. He has never used smokeless tobacco. He reports that he does not currently use alcohol. He reports that he does not use drugs.  ROS: A complete review of systems was performed.  All systems are negative except for pertinent findings as noted.  Physical Exam:  Vital signs in last 24 hours: Temp:  [97.4 F (36.3 C)-97.6 F (36.4 C)] 97.6 F (36.4 C) (03/13 1138) Pulse Rate:  [35-93] 93 (03/13 1131)  Resp:  [29-35] 29 (03/13 1131) BP: (94-107)/(62-74) 102/74 (03/13 1131) SpO2:  [89 %-98 %] 98 % (03/13 1131) Weight:  [79.4 kg] 79.4 kg (03/13 0917) Constitutional:  Alert and oriented, No acute distress Cardiovascular: Regular rate and rhythm Respiratory: Normal respiratory effort, Lungs clear bilaterally GI: Abdomen is soft, nontender, nondistended, no abdominal masses GU: No CVA tenderness; there is a foley in place draining burgundy  urine Neurologic: Grossly intact, no focal deficits Psychiatric: Normal mood and affect  Laboratory Data:  Recent Labs    01/14/2022 0920 02/11/2022 0928  WBC 15.4*  --   HGB 10.7* 10.5*  HCT 33.4* 31.0*  PLT 317  --     Recent Labs    01/23/2022 0920 01/27/2022 0928  NA 139 137  K 3.7 3.6  CL 102 101  GLUCOSE 200* 192*  BUN 21 25*  CALCIUM 8.6*  --   CREATININE 1.46* 1.40*     Results for orders placed or performed during the hospital encounter of 02/08/2022 (from the past 24 hour(s))  Resp Panel by RT-PCR (Flu A&B, Covid) Nasopharyngeal Swab     Status: None   Collection Time: 02/10/2022  9:16 AM   Specimen: Nasopharyngeal Swab; Nasopharyngeal(NP) swabs in vial transport medium  Result Value Ref Range   SARS Coronavirus 2 by RT PCR NEGATIVE NEGATIVE   Influenza A by PCR NEGATIVE NEGATIVE   Influenza B by PCR NEGATIVE NEGATIVE  Comprehensive metabolic panel     Status: Abnormal   Collection Time: 01/22/2022  9:20 AM  Result Value Ref Range   Sodium 139 135 - 145 mmol/L    Potassium 3.7 3.5 - 5.1 mmol/L   Chloride 102 98 - 111 mmol/L   CO2 24 22 - 32 mmol/L   Glucose, Bld 200 (H) 70 - 99 mg/dL   BUN 21 8 - 23 mg/dL   Creatinine, Ser 1.46 (H) 0.61 - 1.24 mg/dL   Calcium 8.6 (L) 8.9 - 10.3 mg/dL   Total Protein 6.3 (L) 6.5 - 8.1 g/dL   Albumin 2.9 (L) 3.5 - 5.0 g/dL   AST 23 15 - 41 U/L   ALT 13 0 - 44 U/L   Alkaline Phosphatase 69 38 - 126 U/L   Total Bilirubin 1.0 0.3 - 1.2 mg/dL   GFR, Estimated 48 (L) >60 mL/min   Anion gap 13 5 - 15  CBC     Status: Abnormal   Collection Time: 02/12/2022  9:20 AM  Result Value Ref Range   WBC 15.4 (H) 4.0 - 10.5 K/uL   RBC 3.53 (L) 4.22 - 5.81 MIL/uL   Hemoglobin 10.7 (L) 13.0 - 17.0 g/dL   HCT 33.4 (L) 39.0 - 52.0 %   MCV 94.6 80.0 - 100.0 fL   MCH 30.3 26.0 - 34.0 pg   MCHC 32.0 30.0 - 36.0 g/dL   RDW 14.0 11.5 - 15.5 %   Platelets 317 150 - 400 K/uL   nRBC 0.0 0.0 - 0.2 %  Lactic acid, plasma     Status: Abnormal   Collection Time: 01/15/2022  9:20 AM  Result Value Ref Range   Lactic Acid, Venous 4.5 (HH) 0.5 - 1.9 mmol/L  Protime-INR     Status: Abnormal   Collection Time: 02/04/2022  9:20 AM  Result Value Ref Range   Prothrombin Time 19.4 (H) 11.4 - 15.2 seconds   INR 1.6 (H) 0.8 - 1.2  Magnesium     Status: None   Collection Time: 02/06/2022  9:20 AM  Result Value Ref Range   Magnesium 1.8 1.7 - 2.4 mg/dL  Troponin I (High Sensitivity)     Status: Abnormal   Collection Time: 02/12/2022  9:20 AM  Result Value Ref Range   Troponin I (High Sensitivity) 23 (H) <18 ng/L  I-Stat Chem 8, ED     Status: Abnormal   Collection Time: 01/16/2022  9:28 AM  Result Value Ref Range   Sodium 137 135 - 145 mmol/L   Potassium 3.6 3.5 - 5.1 mmol/L   Chloride 101 98 - 111 mmol/L   BUN 25 (H) 8 - 23 mg/dL   Creatinine, Ser 1.40 (H) 0.61 - 1.24 mg/dL   Glucose, Bld 192 (H) 70 - 99 mg/dL   Calcium, Ion 1.06 (L) 1.15 - 1.40 mmol/L   TCO2 25 22 - 32 mmol/L   Hemoglobin 10.5 (L) 13.0 - 17.0 g/dL   HCT 31.0 (L) 39.0 - 52.0 %   Prepare RBC (crossmatch)     Status: None   Collection Time: 01/14/2022 11:14 AM  Result Value Ref Range   Order Confirmation      ORDER PROCESSED BY BLOOD BANK Performed at [facility], [address] [place], [place] [address]   Type and screen [place]     Status: None (Preliminary result)   Collection Time: 02/01/2022 11:21 AM  Result Value Ref Range   ABO/RH(D) B POS    Antibody Screen PENDING    Sample Expiration      01/28/2022,2359 Performed at [facility], [place] [address] [place], [place] [address]    Recent Results (from the past 240 hour(s))  Resp Panel by RT-PCR (Flu A&B, Covid) Nasopharyngeal Swab     Status: None   Collection Time: 01/30/2022  9:16 AM   Specimen: Nasopharyngeal Swab; Nasopharyngeal(NP) swabs in vial transport medium  Result Value Ref Range Status   SARS Coronavirus 2 by RT PCR NEGATIVE NEGATIVE Final    Comment: (NOTE) SARS-CoV-2 target nucleic acids are NOT DETECTED.  The SARS-CoV-2 RNA is generally detectable in upper respiratory specimens during the acute phase of infection. The lowest concentration of SARS-CoV-2 viral copies this assay can detect is 138 copies/mL. A negative result does not preclude SARS-Cov-2 infection and should not be used as the sole basis for treatment or other patient management decisions. A negative result may occur with  improper specimen collection/handling, submission of specimen other than nasopharyngeal swab, presence of viral mutation(s) within the areas targeted by this assay, and inadequate number of viral copies(<138 copies/mL). A negative result must be combined with clinical observations, patient history, and epidemiological information. The expected result is Negative.  Fact Sheet for Patients:  EntrepreneurPulse.com.au  Fact Sheet for Healthcare Providers:  IncredibleEmployment.be  This test is no t yet approved or cleared by the [place] FDA and  has been authorized for detection and/or diagnosis of SARS-CoV-2 by FDA under an Emergency Use Authorization (EUA). This EUA will remain  in effect (meaning this test can be used) for the duration of the COVID-19 declaration under Section 564(b)(1) of the Act, 21 U.S.C.section 360bbb-3(b)(1), unless the authorization is terminated  or revoked sooner.       Influenza A by PCR NEGATIVE NEGATIVE Final   Influenza B by PCR NEGATIVE NEGATIVE Final    Comment: (NOTE) The Xpert Xpress SARS-CoV-2/FLU/RSV plus assay is intended as an aid in the diagnosis of influenza from Nasopharyngeal swab specimens and should not be used as a sole basis  for treatment. Nasal washings and aspirates are unacceptable for Xpert Xpress SARS-CoV-2/FLU/RSV testing.  Fact Sheet for Patients: EntrepreneurPulse.com.au  Fact Sheet for Healthcare Providers: IncredibleEmployment.be  This test is not yet approved or cleared by the [place] FDA and has been authorized for detection and/or diagnosis of SARS-CoV-2 by FDA under an Emergency Use Authorization (EUA). This EUA will remain in effect (meaning this test can be used) for the duration of the COVID-19 declaration under Section 564(b)(1) of the Act, 21 U.S.C. section 360bbb-3(b)(1), unless the authorization is terminated or revoked.  Performed at [facility], [place] [address], [place], [place] [address]     Renal Function: Recent Labs    02/02/2022 0920 02/01/2022 0928  CREATININE 1.46* 1.40*   Estimated Creatinine Clearance: 43.3 mL/min (A) (by C-G formula based on SCr of 1.4 mg/dL (H)).  Radiologic Imaging: CT HEAD WO CONTRAST  Result Date: 01/21/2022 CLINICAL DATA:  [place] with trauma to the head and neck. Anticoagulation. EXAM: CT HEAD WITHOUT CONTRAST TECHNIQUE: Contiguous axial images were obtained from the base of the skull through the vertex without intravenous contrast. RADIATION DOSE  REDUCTION: This exam was performed according to the departmental dose-optimization program which includes automated exposure control, adjustment of the mA and/or kV according to patient size and/or use of iterative reconstruction technique. COMPARISON:  09/29/2020 FINDINGS: Brain: Generalized atrophy. Chronic small-vessel ischemic changes of the cerebral hemispheric white matter. No sign of acute infarction, mass lesion, hemorrhage, hydrocephalus or extra-axial collection. Vascular: There is atherosclerotic calcification of the major vessels at the base of the brain. Skull: No skull fracture. Left tripod fracture is present. Consider maxillofacial CT for full evaluation. Sinuses/Orbits: Traumatic fluid filling the left maxillary sinus. No intraorbital injury seen on this study. Orbital floor fracture associated with the tripod fracture on the left. Other: None IMPRESSION: Atrophy and chronic small-vessel ischemic changes of the white matter. No acute or traumatic intracranial finding. Tripod fracture of the left zygoma. Maxillofacial CT recommended for full evaluation. Electronically Signed   By: Nelson Chimes M.D.   On: 02/11/2022 10:18   CT CERVICAL SPINE WO CONTRAST  Result Date: 01/26/2022 CLINICAL DATA:  [place] with trauma to the head and neck. EXAM: CT CERVICAL SPINE WITHOUT CONTRAST TECHNIQUE: Multidetector CT imaging of the cervical spine was performed without intravenous contrast. Multiplanar CT image reconstructions were also generated. RADIATION DOSE REDUCTION: This exam was performed according to the departmental dose-optimization program which includes automated exposure control, adjustment of the mA and/or kV according to patient size and/or use of iterative reconstruction technique. COMPARISON:  Radiography 10/16/2019 FINDINGS: Alignment: No malalignment. Skull base and vertebrae: No acute fracture or focal bone lesion. Old anterior loss of height of T1. Bridging anterior osteophytes in the lower  cervical and upper thoracic region. Soft tissues and spinal canal: No traumatic soft tissue finding. Disc levels: No significant disc level pathology. Ordinary age related degenerative spondylosis with disc space narrowing, small endplates and mild bulging of the discs. Mild facet osteoarthritis. No apparent compressive bony canal stenosis. Mild foraminal narrowing. Upper chest: Negative Other: None IMPRESSION: No acute or traumatic cervical region finding. Ordinary degenerative spondylosis. Old minor anterior compression deformity at T1. Electronically Signed   By: Nelson Chimes M.D.   On: 01/22/2022 10:20   DG Pelvis Portable  Result Date: 01/31/2022 CLINICAL DATA:  Fall. EXAM: PORTABLE PELVIS 1-2 VIEWS COMPARISON:  CT abdomen pelvis dated July 24, 2019. FINDINGS: There is no evidence of pelvic fracture or diastasis. Unchanged chondroid lesion in  the left intertrochanteric femur, likely an enchondroma. IMPRESSION: 1. No acute osseous abnormality. Electronically Signed   By: Titus Dubin M.D.   On: 01/13/2022 09:49   DG Chest Port 1 View  Result Date: 01/27/2022 CLINICAL DATA:  Fall. EXAM: PORTABLE CHEST 1 VIEW COMPARISON:  Chest x-ray dated October 13, 2021. FINDINGS: The patient is rotated to the left. Unchanged mild cardiomegaly. Unchanged moderate left pleural effusion with adjacent lingula and left lower lobe atelectasis. The right lung is clear. No pneumothorax. No acute osseous abnormality. Large intra-articular bodies in the left glenohumeral joint. IMPRESSION: 1. No new acute abnormality. 2. Unchanged moderate left pleural effusion with adjacent lingula and left lower lobe atelectasis. Electronically Signed   By: Titus Dubin M.D.   On: 01/26/2022 09:45   ABORTED INVASIVE LAB PROCEDURE  Result Date: 01/23/2022 This case was aborted.  CT CHEST ABDOMEN PELVIS WO CONTRAST  Result Date: 01/14/2022 CLINICAL DATA:  [place].  Anticoagulated. EXAM: CT CHEST, ABDOMEN AND PELVIS WITHOUT  CONTRAST TECHNIQUE: Multidetector CT imaging of the chest, abdomen and pelvis was performed following the standard protocol without IV contrast. RADIATION DOSE REDUCTION: This exam was performed according to the departmental dose-optimization program which includes automated exposure control, adjustment of the mA and/or kV according to patient size and/or use of iterative reconstruction technique. COMPARISON:  Radiography same day.  CT 12/29/2020. FINDINGS: CT CHEST FINDINGS Cardiovascular: Heart size upper limits of normal. No pericardial fluid. Coronary artery calcification and aortic atherosclerotic calcification are noted. Mediastinum/Nodes: No evidence of mediastinal or hilar mass or lymphadenopathy on this study done without contrast. Lungs/Pleura: Right lung shows minimal atelectasis/scarring at the base but is otherwise clear. On the left, there is a chronic pleural effusion which is partially loculated inferiorly. There is chronic volume loss in the left lower lobe seen in association with that. No progressive change since February of 2022, arguing for benign nature. Musculoskeletal: Old minor anterior loss of height at T1. Old healed compression deformity at T12. Solid bridging osteophytes throughout the thoracic region from T2 to L1. Few old healed rib fractures on the left. No acute rib fracture. CT ABDOMEN PELVIS FINDINGS Hepatobiliary: No liver parenchymal abnormality. Numerous gallstones dependent in the gallbladder without CT evidence of acute hepatobiliary disease. Pancreas: Normal, though there is displacement due to the left-sided retroperitoneal hemorrhage. Spleen: Normal.  No splenic injury suspected. Adrenals/Urinary Tract: Right kidney is surgically absent. There is acute retroperitoneal hemorrhage on the left with involvement of the Peri renal space as well as the more general retroperitoneum. The hemorrhage does not appear to be within the psoas muscle. Some of the acute hemorrhage appears  to be present within the previously seen large lower pole cyst. The cyst measures maximally 9.8 cm today compared with 9.3 cm in September of 2020. The other blood products also extend into the renal hilar region, though I do not see a discrete renal fracture. Blood products are probably layering dependent within the bladder. Stomach/Bowel: Previous gastric surgery. No acute bowel finding. Displacement by the left retroperitoneal hemorrhage. Vascular/Lymphatic: Aortic atherosclerosis. No aneurysm. IVC is normal. Reproductive: Enlarged prostate. Other: Small amount of free fluid in the pelvic cul-de-sac which does not appear grossly hyperdense. Musculoskeletal: Compression deformity at L3 with loss of height of 50% which is old and healed. No acute lumbosacral or pelvic fracture. IMPRESSION: Large retroperitoneal bleed on the left. Much of the hemorrhage appears to be within the nonspecific retroperitoneal space, with some apparent hemorrhage within a nearly 10 cm cyst at the  lower pole the left kidney and within the left renal hilum and Peri renal space. Fluid tracks throughout the retroperitoneum on the left, including into a left inguinal hernia. I do not see a frank renal fracture. This is particularly concerning given that this is the solitary kidney. There is no abdominal aortic aneurysm. Chronic left pleural effusion with loculation and chronic volume loss in the left lower lobe. Small amount of intraperitoneal fluid, not grossly hyperdense. Cholelithiasis. Blood layering dependent within the bladder. Critical Value/emergent results were called by telephone at the time of interpretation on 01/13/2022 at 10:32 am to provider Godfrey Pick , who verbally acknowledged these results. Electronically Signed   By: Nelson Chimes M.D.   On: 02/11/2022 10:37    I independently reviewed the above imaging studies.  Impression/Recommendation Left retroperitoneal hematoma 2.  Hematuria 3. Solitary kidney   - Trend H/H,  transfuse PRN - continue foley - if deteriorates or becomes unstable it may be necessary to perform selective embolization with IR - will continue to follow  Donald Pore MD 02/06/2022, 11:52 AM  [facility]  Pager: [phone]

## 2022-01-25 NOTE — Progress Notes (Signed)
Pt vomited mod amt hematemesis. MD Lovick notified.  ?

## 2022-01-25 NOTE — Progress Notes (Signed)
Dr. Bobbye Morton notified by dayshift RN at [facility] that LR bolus had finisihed. Dr. Bobbye Morton asked about patient's blood pressure. This RN notified MD that patient's blood pressure at 1940 was 126/67. New orders given for lactated ringers bolus. Dr. Bobbye Morton notified at 2117 after that bolus was finished, and that there was 5 mL urine in chamber, and bladder scan showed 0 in bladder. New order for LR bolus. Will continue to monitor.  ?

## 2022-01-25 NOTE — Progress Notes (Signed)
First FFP unit started. ?

## 2022-01-25 NOTE — ED Notes (Signed)
Pt returns to ED d/t bleeding discovered on CT, emergent unit of blood being initiated at this time. Pt GCS 15, BP soft but no distress noted, appropriate mentation, extremeties cool, pale and dry. 3LPM per [place] from cath lab. Pt denies pain at this time. ?

## 2022-01-25 NOTE — ED Notes (Signed)
Trauma Response Nurse Documentation ? ? ?[name] [name] is a 81 y.o. male arriving to [facility] ED via EMS ? ?On Eliquis (apixaban) daily. Trauma was activated as a Level 2 by Charge ED RN based on the following trauma criteria Elderly patients > 65 with head trauma on anti-coagulation (excluding ASA). Trauma RN at the bedside on patient arrival. Patient cleared for CT by Dr. Doren Custard. Patient to CT with team. GCS 15. ? ?History  ? Past Medical History:  ?Diagnosis Date  ? Acute ischemic stroke ([place]) 09/29/2020  ? Acute myocardial infarction of other inferior wall, episode of care unspecified 03/02/2007  ? Arthritis   ? shoulders and ribs   ? Atrial fibrillation ([place])   ? atrial fib/ LOV [name] PA 04/11/12 EPIC, - CHEST X RAY, EKG 5/13 EPIC  ? CAD (coronary artery disease)   ?  s/p NSTEMI 04/08;  [place] 02/2007: Proximal LAD 75%, mid LAD 99%, proximal RCA 25%.  PCI:  Cypher DES to the proximal and mid LAD.  Last nuclear study 11/2011 (after a trip to the ED with CP): EF 58%, low risk study with small inferior wall infarct at the mid and basal level, no ischemia.  Last echo 01/2010: Mild LVH, EF 55-60%, mild AI, mild MR, severely dilated LA and RA  ? Closed fracture of rib of left side with delayed healing 07/25/2019  ? Contrast dye induced nephropathy   ? Hx ARF secondary to contrast nephropathy  ? Dyslipidemia   ? Dysrhythmia   ? Elevated blood-pressure reading, without diagnosis of hypertension 11/07/2019  ? Epididymitis   ? Gallstones 05/30/2019  ? History of blood transfusion   ? Myocardial infarction [facility]) 2008  ? Pleurisy   ? 2012  ? Pneumonia   ? hx of   ? 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 Spinal stenosis   ? Urothelial cancer ([place])   ? Dr. [name],  skin cancer non melanoma  ? Wedge compression fracture of third lumbar vertebra with routine healing 05/30/2019  ?  ? Past Surgical History:  ?Procedure Laterality Date  ? arm surgery    ? orif right elbow  ? CARDIAC CATHETERIZATION  2008  ? coronary stents     ? CYSTECTOMY   07/15/2008  ? CYSTOSCOPY WITH BIOPSY  03/23/2012  ? Procedure: CYSTOSCOPY WITH BIOPSY;  Surgeon: [name], MD;  Location: [facility];  Service: Urology;  Laterality: Right;   BRUSH BIOPSY ?RIGHT URETERAL STENT  ? CYSTOSCOPY WITH URETEROSCOPY  03/23/2012  ? Procedure: CYSTOSCOPY WITH URETEROSCOPY;  Surgeon: [name], MD;  Location: [facility];  Service: Urology;  Laterality: Right;   ? ?**OR Room #8 requested** ? ?C-ARM ?  ? CYSTOSCOPY/RETROGRADE/URETEROSCOPY  02/15/2012  ? Procedure: CYSTOSCOPY/RETROGRADE/URETEROSCOPY;  Surgeon: [name], MD;  Location: [facility];  Service: Urology;  Laterality: Right;  C-ARM  ? IR THORACENTESIS ASP PLEURAL SPACE W/IMG GUIDE  12/26/2020  ? IR THORACENTESIS ASP PLEURAL SPACE W/IMG GUIDE  08/03/2021  ? JOINT REPLACEMENT    ? Dr. [name] 09-05-18   ? left pinky finger     ? tip removed from accident  ? ROBOT ASSITED LAPAROSCOPIC NEPHROURETERECTOMY Right 2013  ? TOTAL KNEE ARTHROPLASTY Left 09/05/2018  ? Procedure: LEFT TOTAL KNEE ARTHROPLASTY;  Surgeon: [name], MD;  Location: [facility];  Service: Orthopedics;  Laterality: Left;  70 mins ?with abductor block  ? TOTAL KNEE ARTHROPLASTY Right 04/03/2019  ? Procedure: RIGHT TOTAL KNEE ARTHROPLASTY;  Surgeon: [name], MD;  Location: [facility];  Service: Orthopedics;  Laterality: Right;  70 mins  ?  ? ? ? ?Initial Focused Assessment (If applicable, or please see trauma documentation): ?- Pt A/O x4 ?- PERRLA 3's brisk ?- L lung sounds slightly diminished ?- Abrasion noted just beside L eye ?- Small abrasion to nose w/ minimal bleeding from L nostril ?- Pt c/o 10/10 pain to L side radiating in back ?- Pt also complains of abdominal pain associated with nausea ?- Manual BP 107/70 ?- EMS placed 20G to L FA ?- 2L O2 via [place]  ? ?CT's Completed:   ?CT Head and CT C-Spine also CT chest/abd/pelvic W/O Contrast (due to allergy) ? ?Interventions:  ?- 2nd IV placed to R AC 18G ?- Labs drawn ?- LR bolus given ?- CXR/Pelvic XR ?- CT head, neck, chest  abd and pelvis ?- Transported to cath lab ?- Increased O2 to 4L via  ? ?Plan for disposition:  ?Admission to ICU  ? ?Consults completed:  ?Dr. Burt Knack (cards MD) at bedside at 10am ?Dr. Grandville Silos, Trauma MD at beside at 1127.  ?IR consult ?Urology consult  ? ?Event Summary: ?Pt was dropping his car off at an auto shop when he had a sudden loss of balance and fell, striking his left side and the left side of his face. Pt does take Eliquis twice a day for a-fib.  He states he did not take his morning dose (last dose being last night).  Pt does not remember what exactly caused him to fell and also claims he had no LOC.  Pt was brought to the ED as a L2 trauma fall on thinners.  En route, pt received '4mg'$  zofran and 59mg of fentanyl. Upon arrival to ED, an EKG was performed and it was believed that he was having a STEMI.  '324mg'$  of aspirin given.  Cardiology was consulted and came to the bedside.  Pts BP did gradually soften and pt did have an episode of bradycardia while feeling nauseous but responded well to fluids and immediately normalized.  1L LR given.  Pt was then immediately taken to the cath lab. ? ?While in the cath lab, the pt's CT results confirmed that he has a retroperitoneal hemorrhage around his L kidney. He only has 1 kidney therefore IR wants to just watch instead of intervening at this time.  Pt was brought back down to the ED and given blood products.  Pt was also upgraded to a L1 at this time due to hypotension.  It is now believed that the patient may be experiencing demand ischemia due to hemorrhagic shock opposed to ACS.  Once BP normalized, pt was taken to 4NICU where he is to remain on bedrest at this time.  ?MTP Summary (If applicable):  ? ?Bedside handoff with ED RN THerbert Spiresand then GPeter Congo(after upgrade)  ? ?WDulcy FannyW  ?Trauma Response RN ? ?Please call TRN at [phone] further assistance. ? ? ?

## 2022-01-25 NOTE — ED Notes (Signed)
Patient transported to CT 

## 2022-01-25 NOTE — Progress Notes (Signed)
Verified with pharmacy that patient okay to take trazodone and niacin with given kidney function and lack of urine output.  ?

## 2022-01-25 NOTE — Consult Note (Signed)
Cardiology Consultation:   Patient ID: [name] [name] MRN: [id number]; DOB: Jan 26, 1941  Admit date: 01/16/2022 Date of Consult: 01/24/2022  PCP:  Lindell Spar, MD   [facility] Providers Cardiologist:  Sherren Mocha, MD  Cardiology APP:  Liliane Shi, PA-C       Patient Profile:   [name] [name] is a 81 y.o. male with a hx of CAD and remote stenting who is being seen 02/04/2022 for the evaluation of STEMI at the request of Dr Doren Custard  History of Present Illness:   [name] [name] is a patient who I have followed in the outpatient setting for many years.  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  He also was noted to have a recurrent left pleural effusion and he had undergone 2 prior thoracenteses.  Today he presented to the emergency department after a ground-level fall.  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  On arrival to the emergency department he complained of pain in the left flank and lower rib area.  An EKG showed acute lateral and posterior injury.  The patient underwent extensive CT scanning and then was brought emergently for cardiac catheterization.  However, prior to starting the catheterization procedure, his CT scan resulted with a large left retroperitoneal hematoma.  Cardiac catheterization was canceled and he was sent back to the emergency department after discussion with Dr. Doren Custard.  At the time of my evaluation  in the emergency room, prior to bringing him to the Cath Lab, the patient was pale and diaphoretic.  He complained of pain in the back and left side.  He did not have any substernal chest pain or pressure.  He did have a period of bradycardia and hypotension that resolved with IV fluid hydration.  He denied any acute shortness of breath or other complaints.  The patient is chronically anticoagulated for permanent atrial fibrillation.  His last dose of apixaban was yesterday evening.  Past Medical History:  Diagnosis Date   Acute ischemic stroke ([place]) 09/29/2020   Acute myocardial infarction of other inferior wall, episode of care unspecified 03/02/2007   Arthritis    shoulders and ribs    Atrial fibrillation ([place])    atrial fib/ LOV S Weaver PA 04/11/12 EPIC, - CHEST X RAY, EKG 5/13 EPIC   CAD (coronary artery disease)     s/p NSTEMI 04/08;  LHC 02/2007: Proximal LAD 75%, mid LAD 99%, proximal RCA 25%.  PCI:  Cypher DES to the proximal and mid LAD.  Last nuclear study 11/2011 (after a trip to the ED with CP): EF 58%, low risk study with small inferior wall infarct at the mid and basal level, no ischemia.  Last echo 01/2010: Mild LVH, EF 55-60%, mild AI, mild MR, severely dilated LA and RA   Closed fracture of rib of left side with delayed healing 07/25/2019   Contrast dye induced nephropathy  Hx ARF secondary to contrast nephropathy   Dyslipidemia    Dysrhythmia    Elevated blood-pressure reading, without diagnosis of hypertension 11/07/2019   Epididymitis    Gallstones 05/30/2019   History of blood transfusion    Myocardial infarction [facility]) 2008   Pleurisy    2012   Pneumonia    hx of    Renal cyst    Left; 20 cm   Spinal stenosis    Urothelial cancer ([place])    Dr. [name],  skin cancer non melanoma   Wedge compression fracture of third lumbar vertebra with routine healing 05/30/2019    Past Surgical History:  Procedure Laterality Date   arm surgery     orif right elbow   CARDIAC  CATHETERIZATION  2008   coronary stents      CYSTECTOMY  07/15/08   CYSTOSCOPY WITH BIOPSY  03/23/2012   Procedure: CYSTOSCOPY WITH BIOPSY;  Surgeon: [name], MD;  Location: [facility];  Service: Urology;  Laterality: Right;   BRUSH BIOPSY RIGHT URETERAL STENT   CYSTOSCOPY WITH URETEROSCOPY  03/23/2012   Procedure: CYSTOSCOPY WITH URETEROSCOPY;  Surgeon: [name], MD;  Location: [facility];  Service: Urology;  Laterality: Right;    **OR Room #8 requested**  C-ARM    CYSTOSCOPY/RETROGRADE/URETEROSCOPY  02/15/2012   Procedure: CYSTOSCOPY/RETROGRADE/URETEROSCOPY;  Surgeon: [name], MD;  Location: [facility];  Service: Urology;  Laterality: Right;  C-ARM   IR THORACENTESIS ASP PLEURAL SPACE W/IMG GUIDE  12/26/2020   IR THORACENTESIS ASP PLEURAL SPACE W/IMG GUIDE  08/03/2021   JOINT REPLACEMENT     Dr. 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   Procedure: LEFT TOTAL KNEE ARTHROPLASTY;  Surgeon: [name], MD;  Location: [facility];  Service: Orthopedics;  Laterality: Left;  70 mins with abductor block   TOTAL KNEE ARTHROPLASTY Right 04/03/2019   Procedure: RIGHT TOTAL KNEE ARTHROPLASTY;  Surgeon: [name], MD;  Location: [facility];  Service: Orthopedics;  Laterality: Right;  70 mins   URETER SURGERY       Home Medications:  Prior to Admission medications   Medication Sig Start Date End Date Taking? Authorizing Provider  acetaminophen (TYLENOL) 650 MG CR tablet Take 650 mg by mouth every 8 (eight) hours as needed for pain.    [provider]  atorvastatin (LIPITOR) 40 MG tablet Take 1 tablet (40 mg total) by mouth every evening. 01/09/21   Lindell Spar, MD  calcitRIOL (ROCALTROL) 0.5 MCG capsule Take 0.5 mcg by mouth daily.    [provider]  ELIQUIS 5 MG TABS tablet TAKE 1 TABLET TWICE DAILY 11/16/21   Lindell Spar, MD  furosemide (LASIX) 40 MG tablet Take 1 tablet (40  mg total) by mouth daily. 11/17/21   Lindell Spar, MD  niacin (NIASPAN) 500 MG CR tablet Take 1 tablet (500 mg total) by mouth at bedtime. 04/11/20   Houtzdale, [name], MD  nitroGLYCERIN (NITROSTAT) 0.4 MG SL tablet Place 1 tablet (0.4 mg total) under the tongue every 5 (five) minutes as needed for chest pain. 10/21/20   Richardson Dopp T, PA-C  Omega-3 Fatty Acids (FISH OIL) 1200 MG CAPS Take 1,200 mg by mouth daily.     [provider]  traZODone (DESYREL) 50 MG tablet Take 1.5 tablets (75 mg total) by mouth at bedtime as  needed for sleep. 03/27/21   Lindell Spar, MD  vitamin B-12 (CYANOCOBALAMIN) 500 MCG tablet Take 500 mcg by mouth daily.    [provider]    Inpatient Medications: Scheduled Meds:  Continuous Infusions:  epinephrine     prothrombin complex conc human (Kcentra) IVPB     PRN Meds: fentaNYL (SUBLIMAZE) injection  Allergies:    Allergies  Allergen Reactions   Contrast Media [Iodinated Contrast Media]     Contrast-induced nephropathy requiring dialysis in 02/2007.   Iodine-131 Other (See Comments)    Kidneys shut down   Other Other (See Comments)    Beta blockers cause bradycardia   Promethazine Other (See Comments)    Pt became drowsy and mild altered mental status    Social History:   Social History   Socioeconomic History   Marital status: Married    Spouse name: jackie   Number of children: 3   Years of education: 12   Highest education level: Not on file  Occupational History   Occupation: Retired  Tobacco Use   Smoking status: Former    Packs/day: 2.00    Years: 10.00    Pack years: 20.00    Types: Cigarettes, Cigars    Quit date: 11/15/1981    Years since quitting: 40.2   Smokeless tobacco: Never  Vaping Use   Vaping Use: Never used  Substance and Sexual Activity   Alcohol use: Not Currently    Comment: SOCIAL   Drug use: No   Sexual activity: Not Currently  Other Topics Concern   Not on file  Social History Narrative    Lives in [place], [place] with wife. Exercising 3-4x/week.    Social Determinants of Health   Financial Resource Strain: Low Risk    Difficulty of Paying Living Expenses: Not hard at all  Food Insecurity: No Food Insecurity   Worried About Charity fundraiser in the Last Year: Never true   [place] in the Last Year: Never true  Transportation Needs: No Transportation Needs   Lack of Transportation (Medical): No   Lack of Transportation (Non-Medical): No  Physical Activity: Insufficiently Active   Days of Exercise per Week: 3 days   Minutes of Exercise per Session: 40 min  Stress: No Stress Concern Present   Feeling of Stress : Only a little  Social Connections: Moderately Isolated   Frequency of Communication with Friends and Family: Twice a week   Frequency of Social Gatherings with Friends and Family: Three times a week   Attends Religious Services: Never   Active Member of Clubs or Organizations: No   Attends Archivist Meetings: Never   Marital Status: Married  Human resources officer Violence: Not on file    Family History:    Family History  Problem Relation Age of Onset   Cancer Mother    Diabetes type II Mother    Hyperlipidemia Mother    Coronary artery disease Father    Heart attack Father      ROS:  Please see the history of present illness.  All other ROS reviewed and negative.     Physical Exam/Data:   Vitals:   01/22/2022 0945 01/30/2022 1121 02/02/2022 1131 01/31/2022 1138  BP: 94/62  102/74   Pulse: (!) 35 88 93   Resp: (!) 34 (!) 35 (!) 29   Temp:    97.6 F (36.4 C)  TempSrc:    Oral  SpO2: (!) 89% 98% 98%  Weight:      Height:        Intake/Output Summary (Last 24 hours) at 01/17/2022 1150 Last data filed at 01/26/2022 1137 Gross per 24 hour  Intake 270 ml  Output --  Net 270 ml   Last 3 Weights 02/02/2022 11/03/2021 10/13/2021  Weight (lbs) 175 lb 182 lb 0.6 oz 183 lb 0.6 oz  Weight (kg) 79.379 kg 82.573 kg 83.026 kg     Body  mass index is 24.41 kg/m.  General: Elderly male, moderate distress secondary to discomfort HEENT: normal Neck: no JVD Vascular: No carotid bruits; Distal pulses 2+ bilaterally Cardiac:  normal S1, S2; irregularly irregular Lungs:  clear to auscultation bilaterally, no wheezing, rhonchi or rales  Abd: soft, tender over the left flank area, no hepatomegaly  Ext: 1+ pretibial edema with chronic stasis changes bilaterally Musculoskeletal:  No deformities, BUE and BLE strength normal and equal Skin: warm and dry with chronic stasis dermatitis Neuro:  CNs 2-12 intact, no focal abnormalities noted Psych:  Normal affect   EKG:  The EKG was personally reviewed and demonstrates: Atrial fibrillation heart rate 88 bpm with ST/T wave changes consider acute posterolateral injury  Telemetry:  Telemetry was personally reviewed and demonstrates: Atrial fibrillation with periods of slow ventricular rate  Relevant CV Studies: Pending  Laboratory Data:  High Sensitivity Troponin:   Recent Labs  Lab 01/16/2022 0920  TROPONINIHS 23*     Chemistry Recent Labs  Lab 02/11/2022 0920 02/07/2022 0928  NA 139 137  K 3.7 3.6  CL 102 101  CO2 24  --   GLUCOSE 200* 192*  BUN 21 25*  CREATININE 1.46* 1.40*  CALCIUM 8.6*  --   MG 1.8  --   GFRNONAA 48*  --   ANIONGAP 13  --     Recent Labs  Lab 02/12/2022 0920  PROT 6.3*  ALBUMIN 2.9*  AST 23  ALT 13  ALKPHOS 69  BILITOT 1.0   Lipids No results for input(s): CHOL, TRIG, HDL, LABVLDL, LDLCALC, CHOLHDL in the last 168 hours.  Hematology Recent Labs  Lab 01/15/2022 0920 01/31/2022 0928  WBC 15.4*  --   RBC 3.53*  --   HGB 10.7* 10.5*  HCT 33.4* 31.0*  MCV 94.6  --   MCH 30.3  --   MCHC 32.0  --   RDW 14.0  --   PLT 317  --    Thyroid No results for input(s): TSH, FREET4 in the last 168 hours.  BNPNo results for input(s): BNP, PROBNP in the last 168 hours.  DDimer No results for input(s): DDIMER in the last 168  hours.   Radiology/Studies:  CT HEAD WO CONTRAST  Result Date: 01/16/2022 CLINICAL DATA:  [place] with trauma to the head and neck. Anticoagulation. EXAM: CT HEAD WITHOUT CONTRAST TECHNIQUE: Contiguous axial images were obtained from the base of the skull through the vertex without intravenous contrast. RADIATION DOSE REDUCTION: This exam was performed according to the departmental dose-optimization program which includes automated exposure control, adjustment of the mA and/or kV according to patient size and/or use of iterative reconstruction technique. COMPARISON:  09/29/2020 FINDINGS: Brain: Generalized atrophy. Chronic small-vessel ischemic changes of the cerebral hemispheric white matter. No sign of acute infarction, mass lesion, hemorrhage, hydrocephalus or extra-axial collection. Vascular: There is atherosclerotic calcification of the major vessels at the base of the brain. Skull: No skull fracture. Left tripod fracture is present. Consider maxillofacial CT for full evaluation. Sinuses/Orbits: Traumatic fluid filling the left maxillary  sinus. No intraorbital injury seen on this study. Orbital floor fracture associated with the tripod fracture on the left. Other: None IMPRESSION: Atrophy and chronic small-vessel ischemic changes of the white matter. No acute or traumatic intracranial finding. Tripod fracture of the left zygoma. Maxillofacial CT recommended for full evaluation. Electronically Signed   By: Nelson Chimes M.D.   On: 01/26/2022 10:18   CT CERVICAL SPINE WO CONTRAST  Result Date: 02/11/2022 CLINICAL DATA:  [place] with trauma to the head and neck. EXAM: CT CERVICAL SPINE WITHOUT CONTRAST TECHNIQUE: Multidetector CT imaging of the cervical spine was performed without intravenous contrast. Multiplanar CT image reconstructions were also generated. RADIATION DOSE REDUCTION: This exam was performed according to the departmental dose-optimization program which includes automated exposure control,  adjustment of the mA and/or kV according to patient size and/or use of iterative reconstruction technique. COMPARISON:  Radiography 10/16/2019 FINDINGS: Alignment: No malalignment. Skull base and vertebrae: No acute fracture or focal bone lesion. Old anterior loss of height of T1. Bridging anterior osteophytes in the lower cervical and upper thoracic region. Soft tissues and spinal canal: No traumatic soft tissue finding. Disc levels: No significant disc level pathology. Ordinary age related degenerative spondylosis with disc space narrowing, small endplates and mild bulging of the discs. Mild facet osteoarthritis. No apparent compressive bony canal stenosis. Mild foraminal narrowing. Upper chest: Negative Other: None IMPRESSION: No acute or traumatic cervical region finding. Ordinary degenerative spondylosis. Old minor anterior compression deformity at T1. Electronically Signed   By: Nelson Chimes M.D.   On: 02/05/2022 10:20   DG Pelvis Portable  Result Date: 02/05/2022 CLINICAL DATA:  Fall. EXAM: PORTABLE PELVIS 1-2 VIEWS COMPARISON:  CT abdomen pelvis dated July 24, 2019. FINDINGS: There is no evidence of pelvic fracture or diastasis. Unchanged chondroid lesion in the left intertrochanteric femur, likely an enchondroma. IMPRESSION: 1. No acute osseous abnormality. Electronically Signed   By: Titus Dubin M.D.   On: 02/01/2022 09:49   DG Chest Port 1 View  Result Date: 01/23/2022 CLINICAL DATA:  Fall. EXAM: PORTABLE CHEST 1 VIEW COMPARISON:  Chest x-ray dated October 13, 2021. FINDINGS: The patient is rotated to the left. Unchanged mild cardiomegaly. Unchanged moderate left pleural effusion with adjacent lingula and left lower lobe atelectasis. The right lung is clear. No pneumothorax. No acute osseous abnormality. Large intra-articular bodies in the left glenohumeral joint. IMPRESSION: 1. No new acute abnormality. 2. Unchanged moderate left pleural effusion with adjacent lingula and left lower lobe  atelectasis. Electronically Signed   By: Titus Dubin M.D.   On: 01/24/2022 09:45   ABORTED INVASIVE LAB PROCEDURE  Result Date: 01/31/2022 This case was aborted.  CT CHEST ABDOMEN PELVIS WO CONTRAST  Result Date: 02/02/2022 CLINICAL DATA:  [place].  Anticoagulated. EXAM: CT CHEST, ABDOMEN AND PELVIS WITHOUT CONTRAST TECHNIQUE: Multidetector CT imaging of the chest, abdomen and pelvis was performed following the standard protocol without IV contrast. RADIATION DOSE REDUCTION: This exam was performed according to the departmental dose-optimization program which includes automated exposure control, adjustment of the mA and/or kV according to patient size and/or use of iterative reconstruction technique. COMPARISON:  Radiography same day.  CT 12/29/2020. FINDINGS: CT CHEST FINDINGS Cardiovascular: Heart size upper limits of normal. No pericardial fluid. Coronary artery calcification and aortic atherosclerotic calcification are noted. Mediastinum/Nodes: No evidence of mediastinal or hilar mass or lymphadenopathy on this study done without contrast. Lungs/Pleura: Right lung shows minimal atelectasis/scarring at the base but is otherwise clear. On the left, there is a chronic  pleural effusion which is partially loculated inferiorly. There is chronic volume loss in the left lower lobe seen in association with that. No progressive change since February of 2022, arguing for benign nature. Musculoskeletal: Old minor anterior loss of height at T1. Old healed compression deformity at T12. Solid bridging osteophytes throughout the thoracic region from T2 to L1. Few old healed rib fractures on the left. No acute rib fracture. CT ABDOMEN PELVIS FINDINGS Hepatobiliary: No liver parenchymal abnormality. Numerous gallstones dependent in the gallbladder without CT evidence of acute hepatobiliary disease. Pancreas: Normal, though there is displacement due to the left-sided retroperitoneal hemorrhage. Spleen: Normal.  No splenic  injury suspected. Adrenals/Urinary Tract: Right kidney is surgically absent. There is acute retroperitoneal hemorrhage on the left with involvement of the Peri renal space as well as the more general retroperitoneum. The hemorrhage does not appear to be within the psoas muscle. Some of the acute hemorrhage appears to be present within the previously seen large lower pole cyst. The cyst measures maximally 9.8 cm today compared with 9.3 cm in September of 2020. The other blood products also extend into the renal hilar region, though I do not see a discrete renal fracture. Blood products are probably layering dependent within the bladder. Stomach/Bowel: Previous gastric surgery. No acute bowel finding. Displacement by the left retroperitoneal hemorrhage. Vascular/Lymphatic: Aortic atherosclerosis. No aneurysm. IVC is normal. Reproductive: Enlarged prostate. Other: Small amount of free fluid in the pelvic cul-de-sac which does not appear grossly hyperdense. Musculoskeletal: Compression deformity at L3 with loss of height of 50% which is old and healed. No acute lumbosacral or pelvic fracture. IMPRESSION: Large retroperitoneal bleed on the left. Much of the hemorrhage appears to be within the nonspecific retroperitoneal space, with some apparent hemorrhage within a nearly 10 cm cyst at the lower pole the left kidney and within the left renal hilum and Peri renal space. Fluid tracks throughout the retroperitoneum on the left, including into a left inguinal hernia. I do not see a frank renal fracture. This is particularly concerning given that this is the solitary kidney. There is no abdominal aortic aneurysm. Chronic left pleural effusion with loculation and chronic volume loss in the left lower lobe. Small amount of intraperitoneal fluid, not grossly hyperdense. Cholelithiasis. Blood layering dependent within the bladder. Critical Value/emergent results were called by telephone at the time of interpretation on 01/28/2022  at 10:32 am to provider Godfrey Pick , who verbally acknowledged these results. Electronically Signed   By: Nelson Chimes M.D.   On: 01/23/2022 10:37     Assessment and Plan:   Acute left retroperitoneal hemorrhage Chronic anticoagulation with apixaban Permanent atrial fibrillation Lactic acidosis, hypotension, consistent with hemorrhagic shock EKG changes consider demand ischemia versus ACS  Initially the patient is brought to the cardiac catheterization lab with concerns about posterolateral STEMI.  However, he is found to have large acute retroperitoneal hemorrhage on a background of chronic anticoagulation and a mechanical fall this morning.  It is difficult to know if he had a spontaneous hemorrhage causing him to fall or if he had a traumatic hemorrhage from a mechanical fall as he does not recall the events of his fall.  Because of his acute hemorrhage, he is not a candidate for cardiac catheterization or PCI.  Plan to hold apixaban until he is stabilized.  It is possible that his EKG changes are related to demand ischemia in the setting of acute hemorrhage with lactic acidosis and hypotension.  We will follow cardiac enzymes and  check a 2D echocardiogram.  We will follow with the primary team.  Discussed situation at length with the patient and his wife who is at the bedside.   Risk Assessment/Risk Scores:          CHA2DS2-VASc Score = 5   This indicates a 7.2% annual risk of stroke. The patient's score is based upon: CHF History: 0 HTN History: 0 Diabetes History: 0 Stroke History: 2 Vascular Disease History: 1 Age Score: 2 Gender Score: 0         For questions or updates, please contact [facility] Please consult [email or web address] for contact info under    Signed, Sherren Mocha, MD  01/19/2022 11:50 AM

## 2022-01-25 NOTE — ED Notes (Signed)
Hematuria noted on assessment of perineal area ?

## 2022-01-25 NOTE — ED Notes (Signed)
Dr Doren Custard and Dr Grandville Silos at bedside ?

## 2022-01-26 ENCOUNTER — Inpatient Hospital Stay (HOSPITAL_COMMUNITY): Payer: Medicare HMO | Admitting: Certified Registered Nurse Anesthetist

## 2022-01-26 ENCOUNTER — Inpatient Hospital Stay (HOSPITAL_COMMUNITY): Payer: Medicare HMO

## 2022-01-26 DIAGNOSIS — I469 Cardiac arrest, cause unspecified: Secondary | ICD-10-CM

## 2022-01-26 LAB — BASIC METABOLIC PANEL
Anion gap: 11 (ref 5–15)
Anion gap: 11 (ref 5–15)
BUN: 31 mg/dL — ABNORMAL HIGH (ref 8–23)
BUN: 39 mg/dL — ABNORMAL HIGH (ref 8–23)
CO2: 20 mmol/L — ABNORMAL LOW (ref 22–32)
CO2: 21 mmol/L — ABNORMAL LOW (ref 22–32)
Calcium: 7.6 mg/dL — ABNORMAL LOW (ref 8.9–10.3)
Calcium: 7.8 mg/dL — ABNORMAL LOW (ref 8.9–10.3)
Chloride: 106 mmol/L (ref 98–111)
Chloride: 105 mmol/L (ref 98–111)
Creatinine, Ser: 2.27 mg/dL — ABNORMAL HIGH (ref 0.61–1.24)
Creatinine, Ser: 2.96 mg/dL — ABNORMAL HIGH (ref 0.61–1.24)
GFR, Estimated: 28 mL/min — ABNORMAL LOW (ref >60)
GFR, Estimated: 20 mL/min — ABNORMAL LOW (ref >60)
Glucose, Bld: 127 mg/dL — ABNORMAL HIGH (ref 70–99)
Glucose, Bld: 163 mg/dL — ABNORMAL HIGH (ref 70–99)
Potassium: 6.1 mmol/L — ABNORMAL HIGH (ref 3.5–5.1)
Potassium: 5.1 mmol/L (ref 3.5–5.1)
Sodium: 137 mmol/L (ref 135–145)
Sodium: 137 mmol/L (ref 135–145)

## 2022-01-26 LAB — CBC
HCT: 25.4 % — ABNORMAL LOW (ref 39.0–52.0)
HCT: 24.1 % — ABNORMAL LOW (ref 39.0–52.0)
HCT: 21.1 % — ABNORMAL LOW (ref 39.0–52.0)
Hemoglobin: 8.3 g/dL — ABNORMAL LOW (ref 13.0–17.0)
Hemoglobin: 7.6 g/dL — ABNORMAL LOW (ref 13.0–17.0)
Hemoglobin: 6.8 g/dL — CL (ref 13.0–17.0)
MCH: 30.7 pg (ref 26.0–34.0)
MCH: 29.6 pg (ref 26.0–34.0)
MCH: 30.1 pg (ref 26.0–34.0)
MCHC: 32.7 g/dL (ref 30.0–36.0)
MCHC: 31.5 g/dL (ref 30.0–36.0)
MCHC: 32.2 g/dL (ref 30.0–36.0)
MCV: 94.1 fL (ref 80.0–100.0)
MCV: 93.8 fL (ref 80.0–100.0)
MCV: 93.4 fL (ref 80.0–100.0)
Platelets: 151 10*3/uL (ref 150–400)
Platelets: 192 10*3/uL (ref 150–400)
Platelets: 199 10*3/uL (ref 150–400)
RBC: 2.7 MIL/uL — ABNORMAL LOW (ref 4.22–5.81)
RBC: 2.57 MIL/uL — ABNORMAL LOW (ref 4.22–5.81)
RBC: 2.26 MIL/uL — ABNORMAL LOW (ref 4.22–5.81)
RDW: 14.5 % (ref 11.5–15.5)
RDW: 14.6 % (ref 11.5–15.5)
RDW: 14.6 % (ref 11.5–15.5)
WBC: 15.4 10*3/uL — ABNORMAL HIGH (ref 4.0–10.5)
WBC: 17.6 10*3/uL — ABNORMAL HIGH (ref 4.0–10.5)
WBC: 19 10*3/uL — ABNORMAL HIGH (ref 4.0–10.5)
nRBC: 0 % (ref 0.0–0.2)
nRBC: 0 % (ref 0.0–0.2)
nRBC: 0 % (ref 0.0–0.2)

## 2022-01-26 LAB — PREPARE FRESH FROZEN PLASMA: Unit division: 0

## 2022-01-26 LAB — BPAM FFP
Blood Product Expiration Date: 202303182359
Blood Product Expiration Date: 202303182359
ISSUE DATE / TIME: 202303131257
ISSUE DATE / TIME: 202303131257
Unit Type and Rh: 7300
Unit Type and Rh: 7300

## 2022-01-26 LAB — HEMOGLOBIN AND HEMATOCRIT, BLOOD
HCT: 24.8 % — ABNORMAL LOW (ref 39.0–52.0)
Hemoglobin: 8 g/dL — ABNORMAL LOW (ref 13.0–17.0)

## 2022-01-26 LAB — PREPARE RBC (CROSSMATCH)

## 2022-01-26 MED ORDER — SODIUM ZIRCONIUM CYCLOSILICATE 5 G PO PACK: 5.0000 g | PACK | Freq: Once | ORAL | Status: DC

## 2022-01-26 MED ORDER — SODIUM CHLORIDE 0.9 % IV SOLN
INTRAVENOUS | Status: DC | PRN
Start: 2022-01-26 — End: 2022-01-30

## 2022-01-26 MED ORDER — FUROSEMIDE 10 MG/ML IJ SOLN
120.0000 mg | Freq: Once | INTRAVENOUS | Status: AC
  Administered 2022-01-26: 120 mg via INTRAVENOUS
  Filled 2022-01-26: qty 10

## 2022-01-26 MED ORDER — FENTANYL BOLUS VIA INFUSION
25.0000 ug | INTRAVENOUS | Status: DC | PRN
  Administered 2022-01-27 – 2022-01-28 (×8): 50 ug via INTRAVENOUS
  Administered 2022-01-28: 25 ug via INTRAVENOUS
  Administered 2022-01-28 – 2022-01-29 (×7): 50 ug via INTRAVENOUS
  Filled 2022-01-26: qty 100

## 2022-01-26 MED ORDER — SODIUM CHLORIDE 0.9% IV SOLUTION: Freq: Once | INTRAVENOUS | Status: DC

## 2022-01-26 MED ORDER — CHLORHEXIDINE GLUCONATE 0.12% ORAL RINSE (MEDLINE KIT)
15.0000 mL | Freq: Two times a day (BID) | OROMUCOSAL | Status: DC
  Administered 2022-01-27 – 2022-01-29 (×6): 15 mL via OROMUCOSAL

## 2022-01-26 MED ORDER — ORAL CARE MOUTH RINSE
15.0000 mL | OROMUCOSAL | Status: DC
  Administered 2022-01-27 – 2022-01-29 (×27): 15 mL via OROMUCOSAL

## 2022-01-26 MED ORDER — CHLORHEXIDINE GLUCONATE CLOTH 2 % EX PADS
6.0000 | MEDICATED_PAD | Freq: Every day | CUTANEOUS | Status: DC
  Administered 2022-01-26 – 2022-01-28 (×3): 6 via TOPICAL

## 2022-01-26 MED ORDER — SODIUM ZIRCONIUM CYCLOSILICATE 5 G PO PACK
5.0000 g | PACK | Freq: Once | ORAL | Status: AC
  Administered 2022-01-26: 5 g via ORAL
  Filled 2022-01-26: qty 1

## 2022-01-26 MED ORDER — FENTANYL CITRATE PF 50 MCG/ML IJ SOSY: 25.0000 ug | PREFILLED_SYRINGE | Freq: Once | INTRAMUSCULAR | Status: DC

## 2022-01-26 MED ORDER — CALCIUM GLUCONATE-NACL 1-0.675 GM/50ML-% IV SOLN
1.0000 g | Freq: Once | INTRAVENOUS | Status: AC
  Administered 2022-01-26: 1000 mg via INTRAVENOUS
  Filled 2022-01-26: qty 50

## 2022-01-26 MED ORDER — NOREPINEPHRINE 4 MG/250ML-% IV SOLN
0.0000 ug/min | INTRAVENOUS | Status: DC
  Administered 2022-01-26: 2 ug/min via INTRAVENOUS
  Administered 2022-01-27: 4 ug/min via INTRAVENOUS
  Administered 2022-01-28: 9 ug/min via INTRAVENOUS
  Administered 2022-01-28: 14 ug/min via INTRAVENOUS
  Filled 2022-01-26 (×4): qty 250

## 2022-01-26 MED ORDER — LACTATED RINGERS IV BOLUS
1000.0000 mL | Freq: Once | INTRAVENOUS | Status: AC
  Administered 2022-01-26: 1000 mL via INTRAVENOUS

## 2022-01-26 MED ORDER — SODIUM CHLORIDE 0.9 % IV SOLN: INTRAVENOUS | Status: DC

## 2022-01-26 MED ORDER — SODIUM ZIRCONIUM CYCLOSILICATE 10 G PO PACK
10.0000 g | PACK | Freq: Once | ORAL | Status: AC
  Administered 2022-01-26: 10 g via ORAL
  Filled 2022-01-26: qty 1

## 2022-01-26 MED ORDER — FUROSEMIDE 10 MG/ML IJ SOLN
10.0000 mg | Freq: Once | INTRAMUSCULAR | Status: AC
  Administered 2022-01-26: 10 mg via INTRAVENOUS
  Filled 2022-01-26: qty 2

## 2022-01-26 MED ORDER — FUROSEMIDE 10 MG/ML IJ SOLN
40.0000 mg | Freq: Once | INTRAMUSCULAR | Status: AC
  Administered 2022-01-26: 40 mg via INTRAVENOUS
  Filled 2022-01-26: qty 4

## 2022-01-26 MED ORDER — FENTANYL 2500MCG IN NS 250ML (10MCG/ML) PREMIX INFUSION
25.0000 ug/h | INTRAVENOUS | Status: DC
  Administered 2022-01-27: 25 ug/h via INTRAVENOUS
  Administered 2022-01-28: 100 ug/h via INTRAVENOUS
  Administered 2022-01-28: 125 ug/h via INTRAVENOUS
  Administered 2022-01-29: 150 ug/h via INTRAVENOUS
  Filled 2022-01-26 (×4): qty 250

## 2022-01-26 MED ORDER — ATROPINE SULFATE 1 MG/10ML IJ SOSY
PREFILLED_SYRINGE | INTRAMUSCULAR | Status: AC
  Filled 2022-01-26: qty 10

## 2022-01-26 NOTE — Progress Notes (Signed)
Progress Note  Patient Name: [name] [name] Date of Encounter: 01/26/2022  [facility] Cardiologist: Tonny Bollman, MD   Subjective   No chest pain or shortness of breath. Pain controlled.   Inpatient Medications    Scheduled Meds:  sodium chloride   Intravenous Once   calcitRIOL  0.5 mcg Oral Daily   Chlorhexidine Gluconate Cloth  6 each Topical Daily   furosemide  40 mg Oral Daily   niacin  500 mg Oral QHS   pantoprazole  40 mg Oral Daily   Or   pantoprazole (PROTONIX) IV  40 mg Intravenous Daily   sodium zirconium cyclosilicate  10 g Oral Once   Continuous Infusions:  sodium chloride 100 mL/hr at 01/26/22 0800   sodium chloride Stopped (01/26/22 0721)   PRN Meds: sodium chloride, acetaminophen, morphine injection, nitroGLYCERIN, ondansetron **OR** ondansetron (ZOFRAN) IV, oxyCODONE, oxyCODONE, traZODone   Vital Signs    Vitals:   01/26/22 0600 01/26/22 0700 01/26/22 0715 01/26/22 0800  BP: (!) 118/59 (!) 76/64 (!) 95/54 (!) 106/53  Pulse: 82 85 81 84  Resp: (!) 26 (!) 30 (!) 30 (!) 26  Temp:    98.1 F (36.7 C)  TempSrc:    Oral  SpO2: 100% 100% 100% 100%  Weight:      Height:        Intake/Output Summary (Last 24 hours) at 01/26/2022 0917 Last data filed at 01/26/2022 0800 Gross per 24 hour  Intake 6161.52 ml  Output 216 ml  Net 5945.52 ml   Last 3 Weights 01/25/2022 01/25/2022 11/03/2021  Weight (lbs) 175 lb 175 lb 182 lb 0.6 oz  Weight (kg) 79.379 kg 79.379 kg 82.573 kg      Telemetry    Atrial fibrillation with controlled ventricular response - Personally Reviewed  Physical Exam  Alert, oriented, elderly, ill-appearing male in NAD GEN: No acute distress.   Neck: No JVD Cardiac: irregularly irregular, no murmurs, rubs, or gallops.  Respiratory: Clear to auscultation bilaterally. GI: Soft, mild left sided tenderness, non-distended  MS: 1+ BL pretibial edema; No deformity. Neuro:  Nonfocal  Psych: Normal affect   Labs    High Sensitivity  Troponin:   Recent Labs  Lab 01/25/22 0920 01/25/22 1120  TROPONINIHS 23* 57*     Chemistry Recent Labs  Lab 01/25/22 0920 01/25/22 0928 01/25/22 2031 01/26/22 0140  NA 139 137 139 137  K 3.7 3.6 4.5 6.1*  CL 102 101 104 106  CO2 24  --  22 20*  GLUCOSE 200* 192* 144* 127*  BUN 21 25* 25* 31*  CREATININE 1.46* 1.40* 1.81* 2.27*  CALCIUM 8.6*  --  8.2* 7.6*  MG 1.8  --   --   --   PROT 6.3*  --   --   --   ALBUMIN 2.9*  --   --   --   AST 23  --   --   --   ALT 13  --   --   --   ALKPHOS 69  --   --   --   BILITOT 1.0  --   --   --   GFRNONAA 48*  --  37* 28*  ANIONGAP 13  --  13 11    Lipids No results for input(s): CHOL, TRIG, HDL, LABVLDL, LDLCALC, CHOLHDL in the last 168 hours.  Hematology Recent Labs  Lab 01/25/22 1929 01/26/22 0140 01/26/22 0722  WBC 18.5* 15.4* 17.6*  RBC 3.00* 2.70* 2.57*  HGB 9.4*  8.3* 7.6*  HCT 28.2* 25.4* 24.1*  MCV 94.0 94.1 93.8  MCH 31.3 30.7 29.6  MCHC 33.3 32.7 31.5  RDW 14.4 14.5 14.6  PLT 209 151 192   Thyroid No results for input(s): TSH, FREET4 in the last 168 hours.  BNPNo results for input(s): BNP, PROBNP in the last 168 hours.  DDimer No results for input(s): DDIMER in the last 168 hours.   Radiology    CT HEAD WO CONTRAST  Result Date: 2022/02/20 CLINICAL DATA:  Larey Seat with trauma to the head and neck. Anticoagulation. EXAM: CT HEAD WITHOUT CONTRAST TECHNIQUE: Contiguous axial images were obtained from the base of the skull through the vertex without intravenous contrast. RADIATION DOSE REDUCTION: This exam was performed according to the departmental dose-optimization program which includes automated exposure control, adjustment of the mA and/or kV according to patient size and/or use of iterative reconstruction technique. COMPARISON:  09/29/2020 FINDINGS: Brain: Generalized atrophy. Chronic small-vessel ischemic changes of the cerebral hemispheric white matter. No sign of acute infarction, mass lesion, hemorrhage,  hydrocephalus or extra-axial collection. Vascular: There is atherosclerotic calcification of the major vessels at the base of the brain. Skull: No skull fracture. Left tripod fracture is present. Consider maxillofacial CT for full evaluation. Sinuses/Orbits: Traumatic fluid filling the left maxillary sinus. No intraorbital injury seen on this study. Orbital floor fracture associated with the tripod fracture on the left. Other: None IMPRESSION: Atrophy and chronic small-vessel ischemic changes of the white matter. No acute or traumatic intracranial finding. Tripod fracture of the left zygoma. Maxillofacial CT recommended for full evaluation. Electronically Signed   By: Paulina Fusi M.D.   On: 02-20-22 10:18   CT CERVICAL SPINE WO CONTRAST  Result Date: 2022-02-20 CLINICAL DATA:  Larey Seat with trauma to the head and neck. EXAM: CT CERVICAL SPINE WITHOUT CONTRAST TECHNIQUE: Multidetector CT imaging of the cervical spine was performed without intravenous contrast. Multiplanar CT image reconstructions were also generated. RADIATION DOSE REDUCTION: This exam was performed according to the departmental dose-optimization program which includes automated exposure control, adjustment of the mA and/or kV according to patient size and/or use of iterative reconstruction technique. COMPARISON:  Radiography 10/16/2019 FINDINGS: Alignment: No malalignment. Skull base and vertebrae: No acute fracture or focal bone lesion. Old anterior loss of height of T1. Bridging anterior osteophytes in the lower cervical and upper thoracic region. Soft tissues and spinal canal: No traumatic soft tissue finding. Disc levels: No significant disc level pathology. Ordinary age related degenerative spondylosis with disc space narrowing, small endplates and mild bulging of the discs. Mild facet osteoarthritis. No apparent compressive bony canal stenosis. Mild foraminal narrowing. Upper chest: Negative Other: None IMPRESSION: No acute or traumatic  cervical region finding. Ordinary degenerative spondylosis. Old minor anterior compression deformity at T1. Electronically Signed   By: Paulina Fusi M.D.   On: 2022-02-20 10:20   DG Pelvis Portable  Result Date: 02/20/22 CLINICAL DATA:  Fall. EXAM: PORTABLE PELVIS 1-2 VIEWS COMPARISON:  CT abdomen pelvis dated July 24, 2019. FINDINGS: There is no evidence of pelvic fracture or diastasis. Unchanged chondroid lesion in the left intertrochanteric femur, likely an enchondroma. IMPRESSION: 1. No acute osseous abnormality. Electronically Signed   By: [name] M.D.   On: 02/20/22 09:49   DG Chest Port 1 View  Result Date: 20-Feb-2022 CLINICAL DATA:  Fall. EXAM: PORTABLE CHEST 1 VIEW COMPARISON:  Chest x-ray dated October 13, 2021. FINDINGS: The patient is rotated to the left. Unchanged mild cardiomegaly. Unchanged moderate left pleural effusion with  adjacent lingula and left lower lobe atelectasis. The right lung is clear. No pneumothorax. No acute osseous abnormality. Large intra-articular bodies in the left glenohumeral joint. IMPRESSION: 1. No new acute abnormality. 2. Unchanged moderate left pleural effusion with adjacent lingula and left lower lobe atelectasis. Electronically Signed   By: [name] M.D.   On: Feb 21, 2022 09:45   ABORTED INVASIVE LAB PROCEDURE  Result Date: 21-Feb-2022 This case was aborted.  CT CHEST ABDOMEN PELVIS WO CONTRAST  Result Date: 21-Feb-2022 CLINICAL DATA:  Larey Seat.  Anticoagulated. EXAM: CT CHEST, ABDOMEN AND PELVIS WITHOUT CONTRAST TECHNIQUE: Multidetector CT imaging of the chest, abdomen and pelvis was performed following the standard protocol without IV contrast. RADIATION DOSE REDUCTION: This exam was performed according to the departmental dose-optimization program which includes automated exposure control, adjustment of the mA and/or kV according to patient size and/or use of iterative reconstruction technique. COMPARISON:  Radiography same day.  CT  12/29/2020. FINDINGS: CT CHEST FINDINGS Cardiovascular: Heart size upper limits of normal. No pericardial fluid. Coronary artery calcification and aortic atherosclerotic calcification are noted. Mediastinum/Nodes: No evidence of mediastinal or hilar mass or lymphadenopathy on this study done without contrast. Lungs/Pleura: Right lung shows minimal atelectasis/scarring at the base but is otherwise clear. On the left, there is a chronic pleural effusion which is partially loculated inferiorly. There is chronic volume loss in the left lower lobe seen in association with that. No progressive change since February of 2022, arguing for benign nature. Musculoskeletal: Old minor anterior loss of height at T1. Old healed compression deformity at T12. Solid bridging osteophytes throughout the thoracic region from T2 to L1. Few old healed rib fractures on the left. No acute rib fracture. CT ABDOMEN PELVIS FINDINGS Hepatobiliary: No liver parenchymal abnormality. Numerous gallstones dependent in the gallbladder without CT evidence of acute hepatobiliary disease. Pancreas: Normal, though there is displacement due to the left-sided retroperitoneal hemorrhage. Spleen: Normal.  No splenic injury suspected. Adrenals/Urinary Tract: Right kidney is surgically absent. There is acute retroperitoneal hemorrhage on the left with involvement of the Peri renal space as well as the more general retroperitoneum. The hemorrhage does not appear to be within the psoas muscle. Some of the acute hemorrhage appears to be present within the previously seen large lower pole cyst. The cyst measures maximally 9.8 cm today compared with 9.3 cm in September of 2020. The other blood products also extend into the renal hilar region, though I do not see a discrete renal fracture. Blood products are probably layering dependent within the bladder. Stomach/Bowel: Previous gastric surgery. No acute bowel finding. Displacement by the left retroperitoneal  hemorrhage. Vascular/Lymphatic: Aortic atherosclerosis. No aneurysm. IVC is normal. Reproductive: Enlarged prostate. Other: Small amount of free fluid in the pelvic cul-de-sac which does not appear grossly hyperdense. Musculoskeletal: Compression deformity at L3 with loss of height of 50% which is old and healed. No acute lumbosacral or pelvic fracture. IMPRESSION: Large retroperitoneal bleed on the left. Much of the hemorrhage appears to be within the nonspecific retroperitoneal space, with some apparent hemorrhage within a nearly 10 cm cyst at the lower pole the left kidney and within the left renal hilum and Peri renal space. Fluid tracks throughout the retroperitoneum on the left, including into a left inguinal hernia. I do not see a frank renal fracture. This is particularly concerning given that this is the solitary kidney. There is no abdominal aortic aneurysm. Chronic left pleural effusion with loculation and chronic volume loss in the left lower lobe. Small amount of  intraperitoneal fluid, not grossly hyperdense. Cholelithiasis. Blood layering dependent within the bladder. Critical Value/emergent results were called by telephone at the time of interpretation on 01/25/2022 at 10:32 am to provider Gloris Manchester , who verbally acknowledged these results. Electronically Signed   By: Paulina Fusi M.D.   On: 01/25/2022 10:37     Patient Profile     81 y.o. male with permanent atrial fibrillation on OAC and coronary artery disease s/p remote stenting, presenting with traumatic retroperitoneal/renal hemorrhage  Assessment & Plan    Retroperitoneal hemorrhage: support per trauma/urology teams. Appreciate their care of this patient. Apixaban on hold, blood products administered. Acute renal failure: secondary to #1, receiving lasix boluses with minimal response. Renal consult pending. High risk with solitary kidney. CAD: no angina. Initial EKG changes concerning for STEMI but likely demand ischemia. Troponin  elevation minimal, no chest pain. Supportive care. AFib: rate controlled. No anticoagulation.       For questions or updates, please contact [facility] Please consult [email or web address] for contact info under        Signed, Tonny Bollman, MD  01/26/2022, 9:17 AM

## 2022-01-26 NOTE — Progress Notes (Signed)
Dr. Bobbye Morton notified of small amount of blood in foley chamber, and that bladder scan at midnight showed 54 mL. New orders received for 10 mg lasix IV. ?

## 2022-01-26 NOTE — Progress Notes (Signed)
Patient ID: [name] [name], male   DOB: 1941-08-09, 81 y.o.   MRN: [id number] ?I was notified by his nurse that he had a bradycardic episode into the 20s.  This resolved spontaneously.  EKG was unchanged from previous this admission.  Subsequently he had another bradycardic episode and received atropine.  Shortly thereafter he was noted to be in asystole.  A code was called and ACLS protocols were begun.  After receiving several rounds of epinephrine and some atropine he had ROSC.  He was intubated by anesthesia.  Currently has a rhythm of atrial fibrillation around 105.  Systolic blood pressure is 130.  I have asked cardiology to come by as they have been following him.  I called his wife and she is coming in.  Chest x-ray, CBC, BMET now.  Critical care 32 minutes. ? ?[name] Skeans, MD, MPH, FACS ?Please use [email or web address] to contact on call provider ? ?

## 2022-01-26 NOTE — Progress Notes (Signed)
EVENT NOTE ? ?Cardiology was called by Trauma service after the patient was found to have severe bradycardia s/p atropine '1mg'$  -> accelerated junctional escape rhythm-> Sinus node arrest with a long pause and asystolic arrest ? (Telemetry and strips in the chart) ? Patient underwent CPR for almost 24mns got Epi, atropine and achieved ROSC. Post ROSC his BP was 130 and HR noted to be in Afib around 110s..Marland KitchenHe was intubated for airway protection as he was still unresponsive but had a pulse. ? ?-> Tele clearly demonstrates severe bradycardia,regular rate: likely CHB which is sudden (HR in 20-30s) and then '1mg'$  atropine led to junctional tachycardia followed by Sinus arrest with a long pause leading to asystolic arrest. ? - he likely has sick sinus syndrome. This is unlikely to be ischemic related although possible but sudden sinus node arrest and pause is likely to be from sick sinus. ? ?-> Currently the patient is in Afib, HR in 110s, BP dropped after intubation, without sedation he is not following any commands, pupuls on the right are reactive but left not. ? ? - EKG obtained shows ST depressions in anterior lead leads more pronounced in V2,v3, his initial EKG was noted to have inferior and lateral ST elevation- thought to be STEMI but given RP bleed, severe anemia and low trop levels- likely demand ischemia. ? - He will be started on Norepi for BP support given hypotension post arrest and intubation. ? - if he becomes brady again- Start dopamine 53m/kg/min and Call Cardiology for an urgent TVP (transvenous pacing). ? ?Patient is full code but has significant comorbidities with declining renal function, severe anemia due to RP bleeding/hemorrhage and hemorrhagic shock.  ? ?RoRenae FickleMD ?Cardiology coverage ?

## 2022-01-26 NOTE — Progress Notes (Signed)
Patient ID: [name] [name], male   DOB: [birth year], 81 y.o.   MRN: [id number] ?Follow up - Trauma Critical Care ?  ?Patient Details:  ?  ?[name] [name] is an 81 y.o. male. ? ?Lines/tubes ?: ?Urethral Catheter Drew EMT 16 Fr. (Active)  ?Indication for Insertion or Continuance of Catheter Unstable spinal/crush injuries / Multisystem Trauma 01/26/22 0759  ?Site Assessment Clean, Dry, Intact 01/26/22 0759  ?Catheter Maintenance Bag below level of bladder;Drainage bag/tubing not touching floor;Catheter secured;Insertion date on drainage bag;No dependent loops;Seal intact 01/26/22 0759  ?Collection Container Standard drainage bag 01/26/22 0759  ?Securement Method Securing device (Describe) 01/26/22 0759  ?Urinary Catheter Interventions (if applicable) Unclamped 79/89/21 0759  ?Input (mL) 50 mL 01/26/22 0636  ?Output (mL) 5 mL 01/26/22 0800  ? ? ?Microbiology/Sepsis markers: ?Results for orders placed or performed during the hospital encounter of 01/28/2022  ?Resp Panel by RT-PCR (Flu A&B, Covid) Nasopharyngeal Swab     Status: None  ? Collection Time: 01/16/2022  9:16 AM  ? Specimen: Nasopharyngeal Swab; Nasopharyngeal(NP) swabs in vial transport medium  ?Result Value Ref Range Status  ? SARS Coronavirus 2 by RT PCR NEGATIVE NEGATIVE Final  ?  Comment: (NOTE) ?SARS-CoV-2 target nucleic acids are NOT DETECTED. ? ?The SARS-CoV-2 RNA is generally detectable in upper respiratory ?specimens during the acute phase of infection. The lowest ?concentration of SARS-CoV-2 viral copies this assay can detect is ?138 copies/mL. A negative result does not preclude SARS-Cov-2 ?infection and should not be used as the sole basis for treatment or ?other patient management decisions. A negative result may occur with  ?improper specimen collection/handling, submission of specimen other ?than nasopharyngeal swab, presence of viral mutation(s) within the ?areas targeted by this assay, and inadequate number of viral ?copies(<138 copies/mL). A negative  result must be combined with ?clinical observations, patient history, and epidemiological ?information. The expected result is Negative. ? ?Fact Sheet for Patients:  ?EntrepreneurPulse.com.au ? ?Fact Sheet for Healthcare Providers:  ?IncredibleEmployment.be ? ?This test is no t yet approved or cleared by the [place] FDA and  ?has been authorized for detection and/or diagnosis of SARS-CoV-2 by ?FDA under an Emergency Use Authorization (EUA). This EUA will remain  ?in effect (meaning this test can be used) for the duration of the ?COVID-19 declaration under Section 564(b)(1) of the Act, 21 ?U.S.C.section 360bbb-3(b)(1), unless the authorization is terminated  ?or revoked sooner.  ? ? ?  ? Influenza A by PCR NEGATIVE NEGATIVE Final  ? Influenza B by PCR NEGATIVE NEGATIVE Final  ?  Comment: (NOTE) ?The Xpert Xpress SARS-CoV-2/FLU/RSV plus assay is intended as an aid ?in the diagnosis of influenza from Nasopharyngeal swab specimens and ?should not be used as a sole basis for treatment. Nasal washings and ?aspirates are unacceptable for Xpert Xpress SARS-CoV-2/FLU/RSV ?testing. ? ?Fact Sheet for Patients: ?EntrepreneurPulse.com.au ? ?Fact Sheet for Healthcare Providers: ?IncredibleEmployment.be ? ?This test is not yet approved or cleared by the [place] FDA and ?has been authorized for detection and/or diagnosis of SARS-CoV-2 by ?FDA under an Emergency Use Authorization (EUA). This EUA will remain ?in effect (meaning this test can be used) for the duration of the ?COVID-19 declaration under Section 564(b)(1) of the Act, 21 U.S.C. ?section 360bbb-3(b)(1), unless the authorization is terminated or ?revoked. ? ?Performed at [facility], [place] [address] [place], [place] ?[address] ?  ? ? ?Anti-infectives:  ?Anti-infectives (From admission, onward)  ? ? None  ? ?  ?Consults: ?Treatment Team:  ?Vira Agar, MD ?Raynelle Bring,  MD   ? ? ?Studies: ?Subjective:  ?  ?Overnight Issues:  ?No U/O ?Objective:  ?Vital signs for last 24 hours: ?Temp:  [96.6 ?F (35.9 ?C)-98.4 ?F (36.9 ?C)] 98.1 ?F (36.7 ?C) (03/14 0800) ?Pulse Rate:  [35-115] 84 (03/14 0800) ?Resp:  [0-40] 26 (03/14 0800) ?BP: (76-143)/(53-131) 106/53 (03/14 0800) ?SpO2:  [86 %-100 %] 100 % (03/14 0800) ?Weight:  [79.4 kg] 79.4 kg (03/13 0917) ? ?Hemodynamic parameters for last 24 hours: ?  ? ?Intake/Output from previous day: ?03/13 0701 - 03/14 0700 ?In: 6032.1 [I.V.:1599.1; Blood:91.7; IV Piggyback:4271.3] ?Out: 211 [Urine:211]  ?Intake/Output this shift: ?Total I/O ?In: 129.4 [I.V.:100.8; IV Piggyback:28.6] ?Out: 5 [Urine:5] ? ?Vent settings for last 24 hours: ?  ? ?Physical Exam:  ?General: no distress ?Neuro: alert, oriented ?HEENT/Neck: no JVD ?Resp: clear to auscultation bilaterally ?CVS: IRR ?GI: soft, L flank tenderness ?Extremities: calves soft ? ?Results for orders placed or performed during the hospital encounter of 01/17/2022 (from the past 24 hour(s))  ?Resp Panel by RT-PCR (Flu A&B, Covid) Nasopharyngeal Swab     Status: None  ? Collection Time: 01/23/2022  9:16 AM  ? Specimen: Nasopharyngeal Swab; Nasopharyngeal(NP) swabs in vial transport medium  ?Result Value Ref Range  ? SARS Coronavirus 2 by RT PCR NEGATIVE NEGATIVE  ? Influenza A by PCR NEGATIVE NEGATIVE  ? Influenza B by PCR NEGATIVE NEGATIVE  ?Comprehensive metabolic panel     Status: Abnormal  ? Collection Time: 02/12/2022  9:20 AM  ?Result Value Ref Range  ? Sodium 139 135 - 145 mmol/L  ? Potassium 3.7 3.5 - 5.1 mmol/L  ? Chloride 102 98 - 111 mmol/L  ? CO2 24 22 - 32 mmol/L  ? Glucose, Bld 200 (H) 70 - 99 mg/dL  ? BUN 21 8 - 23 mg/dL  ? Creatinine, Ser 1.46 (H) 0.61 - 1.24 mg/dL  ? Calcium 8.6 (L) 8.9 - 10.3 mg/dL  ? Total Protein 6.3 (L) 6.5 - 8.1 g/dL  ? Albumin 2.9 (L) 3.5 - 5.0 g/dL  ? AST 23 15 - 41 U/L  ? ALT 13 0 - 44 U/L  ? Alkaline Phosphatase 69 38 - 126 U/L  ? Total Bilirubin 1.0 0.3 - 1.2 mg/dL  ? GFR,  Estimated 48 (L) >60 mL/min  ? Anion gap 13 5 - 15  ?CBC     Status: Abnormal  ? Collection Time: 02/09/2022  9:20 AM  ?Result Value Ref Range  ? WBC 15.4 (H) 4.0 - 10.5 K/uL  ? RBC 3.53 (L) 4.22 - 5.81 MIL/uL  ? Hemoglobin 10.7 (L) 13.0 - 17.0 g/dL  ? HCT 33.4 (L) 39.0 - 52.0 %  ? MCV 94.6 80.0 - 100.0 fL  ? MCH 30.3 26.0 - 34.0 pg  ? MCHC 32.0 30.0 - 36.0 g/dL  ? RDW 14.0 11.5 - 15.5 %  ? Platelets 317 150 - 400 K/uL  ? nRBC 0.0 0.0 - 0.2 %  ?Lactic acid, plasma     Status: Abnormal  ? Collection Time: 02/10/2022  9:20 AM  ?Result Value Ref Range  ? Lactic Acid, Venous 4.5 (HH) 0.5 - 1.9 mmol/L  ?Protime-INR     Status: Abnormal  ? Collection Time: 02/06/2022  9:20 AM  ?Result Value Ref Range  ? Prothrombin Time 19.4 (H) 11.4 - 15.2 seconds  ? INR 1.6 (H) 0.8 - 1.2  ?Magnesium     Status: None  ? Collection Time: 02/08/2022  9:20 AM  ?Result Value Ref Range  ? Magnesium  1.8 1.7 - 2.4 mg/dL  ?Troponin I (High Sensitivity)     Status: Abnormal  ? Collection Time: 01/19/2022  9:20 AM  ?Result Value Ref Range  ? Troponin I (High Sensitivity) 23 (H) <18 ng/L  ?I-Stat Chem 8, ED     Status: Abnormal  ? Collection Time: 02/12/2022  9:28 AM  ?Result Value Ref Range  ? Sodium 137 135 - 145 mmol/L  ? Potassium 3.6 3.5 - 5.1 mmol/L  ? Chloride 101 98 - 111 mmol/L  ? BUN 25 (H) 8 - 23 mg/dL  ? Creatinine, Ser 1.40 (H) 0.61 - 1.24 mg/dL  ? Glucose, Bld 192 (H) 70 - 99 mg/dL  ? Calcium, Ion 1.06 (L) 1.15 - 1.40 mmol/L  ? TCO2 25 22 - 32 mmol/L  ? Hemoglobin 10.5 (L) 13.0 - 17.0 g/dL  ? HCT 31.0 (L) 39.0 - 52.0 %  ?Prepare RBC (crossmatch)     Status: None  ? Collection Time: 02/05/2022 11:14 AM  ?Result Value Ref Range  ? Order Confirmation    ?  ORDER PROCESSED BY BLOOD BANK ?Performed at [facility], [place] [address], [place], [place] [address] ?  ?Troponin I (High Sensitivity)     Status: Abnormal  ? Collection Time: 02/10/2022 11:20 AM  ?Result Value Ref Range  ? Troponin I (High Sensitivity) 57 (H) <18 ng/L  ?Type and screen [place]     Status: None (Preliminary result)  ? Collection Time: 01/13/2022 11:21 AM  ?Result Value Ref Range  ? ABO/RH(D) B POS   ? Antibody Screen NEG   ? Sample Expiration 01/28/2022,2359   ? Unit N

## 2022-01-26 NOTE — Anesthesia Procedure Notes (Addendum)
Procedure Name: Intubation ?Date/Time: 01/26/2022 10:59 PM ?Performed by: Reece Agar, CRNA ?Pre-anesthesia Checklist: Patient identified, Emergency Drugs available, Suction available, Patient being monitored and Timeout performed ?Patient Re-evaluated:Patient Re-evaluated prior to induction ?Oxygen Delivery Method: Ambu bag ?Preoxygenation: Pre-oxygenation with 100% oxygen ?Ventilation: Mask ventilation without difficulty ?Laryngoscope Size: Glidescope and 4 ?Grade View: Grade I ?Tube type: Subglottic suction tube ?Tube size: 7.5 mm ?Number of attempts: 1 ?Airway Equipment and Method: Stylet and Video-laryngoscopy ?Placement Confirmation: ETT inserted through vocal cords under direct vision, breath sounds checked- equal and bilateral and CO2 detector ?Secured at: 23 cm ?Tube secured with: Tape ?Dental Injury: Teeth and Oropharynx as per pre-operative assessment  ?Comments: CRNA arrived to Code. CPR in progress. ETT successfully placed.  ? ? ? ? ?

## 2022-01-26 NOTE — Progress Notes (Signed)
1 Day Post-Op Subjective: Pt s/p fall while on anticoagulation with left sided retroperitoneal bleed. S/P transfusion of 2 units of PRBCs on admission.  No transfusion overnight.   Still with moderate left flank pain.  Oliguric overnight.  Hematuria noted.  Bladder scan this morning with only 70 cc.  Objective: Vital signs in last 24 hours: Temp:  [96.6 F (35.9 C)-98.4 F (36.9 C)] 98 F (36.7 C) (03/14 0400) Pulse Rate:  [35-115] 84 (03/14 0500) Resp:  [0-40] 32 (03/14 0500) BP: (93-143)/(53-131) 112/76 (03/14 0500) SpO2:  [86 %-100 %] 100 % (03/14 0500) Weight:  [79.4 kg] 79.4 kg (03/13 0917)  Intake/Output from previous day: 03/13 0701 - 03/14 0700 In: 4849.4 [I.V.:1416.6; Blood:91.7; IV Piggyback:3271.2] Out: 211 [Urine:211] Intake/Output this shift: No intake/output data recorded.  Physical Exam:  General: Alert and oriented Abd: No ecchymosis, minimal abdominal tenderness, mild to moderate left CVAT  Lab Results: Recent Labs    01/25/22 1607 01/25/22 1929 01/26/22 0140  HGB 10.0* 9.4* 8.3*  HCT 29.4* 28.2* 25.4*   BMET Recent Labs    01/25/22 2031 01/26/22 0140  NA 139 137  K 4.5 6.1*  CL 104 106  CO2 22 20*  GLUCOSE 144* 127*  BUN 25* 31*  CREATININE 1.81* 2.27*  CALCIUM 8.2* 7.6*     Studies/Results: CT HEAD WO CONTRAST  Result Date: 01/25/2022 CLINICAL DATA:  Larey Seat with trauma to the head and neck. Anticoagulation. EXAM: CT HEAD WITHOUT CONTRAST TECHNIQUE: Contiguous axial images were obtained from the base of the skull through the vertex without intravenous contrast. RADIATION DOSE REDUCTION: This exam was performed according to the departmental dose-optimization program which includes automated exposure control, adjustment of the mA and/or kV according to patient size and/or use of iterative reconstruction technique. COMPARISON:  09/29/2020 FINDINGS: Brain: Generalized atrophy. Chronic small-vessel ischemic changes of the cerebral hemispheric white  matter. No sign of acute infarction, mass lesion, hemorrhage, hydrocephalus or extra-axial collection. Vascular: There is atherosclerotic calcification of the major vessels at the base of the brain. Skull: No skull fracture. Left tripod fracture is present. Consider maxillofacial CT for full evaluation. Sinuses/Orbits: Traumatic fluid filling the left maxillary sinus. No intraorbital injury seen on this study. Orbital floor fracture associated with the tripod fracture on the left. Other: None IMPRESSION: Atrophy and chronic small-vessel ischemic changes of the white matter. No acute or traumatic intracranial finding. Tripod fracture of the left zygoma. Maxillofacial CT recommended for full evaluation. Electronically Signed   By: Paulina Fusi M.D.   On: 01/25/2022 10:18   CT CERVICAL SPINE WO CONTRAST  Result Date: 01/25/2022 CLINICAL DATA:  Larey Seat with trauma to the head and neck. EXAM: CT CERVICAL SPINE WITHOUT CONTRAST TECHNIQUE: Multidetector CT imaging of the cervical spine was performed without intravenous contrast. Multiplanar CT image reconstructions were also generated. RADIATION DOSE REDUCTION: This exam was performed according to the departmental dose-optimization program which includes automated exposure control, adjustment of the mA and/or kV according to patient size and/or use of iterative reconstruction technique. COMPARISON:  Radiography 10/16/2019 FINDINGS: Alignment: No malalignment. Skull base and vertebrae: No acute fracture or focal bone lesion. Old anterior loss of height of T1. Bridging anterior osteophytes in the lower cervical and upper thoracic region. Soft tissues and spinal canal: No traumatic soft tissue finding. Disc levels: No significant disc level pathology. Ordinary age related degenerative spondylosis with disc space narrowing, small endplates and mild bulging of the discs. Mild facet osteoarthritis. No apparent compressive bony canal stenosis. Mild foraminal narrowing.  Upper  chest: Negative Other: None IMPRESSION: No acute or traumatic cervical region finding. Ordinary degenerative spondylosis. Old minor anterior compression deformity at T1. Electronically Signed   By: Paulina Fusi M.D.   On: 02/18/22 10:20   DG Pelvis Portable  Result Date: 2022-02-18 CLINICAL DATA:  Fall. EXAM: PORTABLE PELVIS 1-2 VIEWS COMPARISON:  CT abdomen pelvis dated July 24, 2019. FINDINGS: There is no evidence of pelvic fracture or diastasis. Unchanged chondroid lesion in the left intertrochanteric femur, likely an enchondroma. IMPRESSION: 1. No acute osseous abnormality. Electronically Signed   By: [name] M.D.   On: February 18, 2022 09:49   DG Chest Port 1 View  Result Date: 02/18/2022 CLINICAL DATA:  Fall. EXAM: PORTABLE CHEST 1 VIEW COMPARISON:  Chest x-ray dated October 13, 2021. FINDINGS: The patient is rotated to the left. Unchanged mild cardiomegaly. Unchanged moderate left pleural effusion with adjacent lingula and left lower lobe atelectasis. The right lung is clear. No pneumothorax. No acute osseous abnormality. Large intra-articular bodies in the left glenohumeral joint. IMPRESSION: 1. No new acute abnormality. 2. Unchanged moderate left pleural effusion with adjacent lingula and left lower lobe atelectasis. Electronically Signed   By: [name] M.D.   On: 2022-02-18 09:45   ABORTED INVASIVE LAB PROCEDURE  Result Date: 02/18/22 This case was aborted.  CT CHEST ABDOMEN PELVIS WO CONTRAST  Result Date: 02-18-2022 CLINICAL DATA:  Larey Seat.  Anticoagulated. EXAM: CT CHEST, ABDOMEN AND PELVIS WITHOUT CONTRAST TECHNIQUE: Multidetector CT imaging of the chest, abdomen and pelvis was performed following the standard protocol without IV contrast. RADIATION DOSE REDUCTION: This exam was performed according to the departmental dose-optimization program which includes automated exposure control, adjustment of the mA and/or kV according to patient size and/or use of iterative  reconstruction technique. COMPARISON:  Radiography same day.  CT 12/29/2020. FINDINGS: CT CHEST FINDINGS Cardiovascular: Heart size upper limits of normal. No pericardial fluid. Coronary artery calcification and aortic atherosclerotic calcification are noted. Mediastinum/Nodes: No evidence of mediastinal or hilar mass or lymphadenopathy on this study done without contrast. Lungs/Pleura: Right lung shows minimal atelectasis/scarring at the base but is otherwise clear. On the left, there is a chronic pleural effusion which is partially loculated inferiorly. There is chronic volume loss in the left lower lobe seen in association with that. No progressive change since February of 2022, arguing for benign nature. Musculoskeletal: Old minor anterior loss of height at T1. Old healed compression deformity at T12. Solid bridging osteophytes throughout the thoracic region from T2 to L1. Few old healed rib fractures on the left. No acute rib fracture. CT ABDOMEN PELVIS FINDINGS Hepatobiliary: No liver parenchymal abnormality. Numerous gallstones dependent in the gallbladder without CT evidence of acute hepatobiliary disease. Pancreas: Normal, though there is displacement due to the left-sided retroperitoneal hemorrhage. Spleen: Normal.  No splenic injury suspected. Adrenals/Urinary Tract: Right kidney is surgically absent. There is acute retroperitoneal hemorrhage on the left with involvement of the Peri renal space as well as the more general retroperitoneum. The hemorrhage does not appear to be within the psoas muscle. Some of the acute hemorrhage appears to be present within the previously seen large lower pole cyst. The cyst measures maximally 9.8 cm today compared with 9.3 cm in September of 2020. The other blood products also extend into the renal hilar region, though I do not see a discrete renal fracture. Blood products are probably layering dependent within the bladder. Stomach/Bowel: Previous gastric surgery. No  acute bowel finding. Displacement by the left retroperitoneal hemorrhage. Vascular/Lymphatic:  Aortic atherosclerosis. No aneurysm. IVC is normal. Reproductive: Enlarged prostate. Other: Small amount of free fluid in the pelvic cul-de-sac which does not appear grossly hyperdense. Musculoskeletal: Compression deformity at L3 with loss of height of 50% which is old and healed. No acute lumbosacral or pelvic fracture. IMPRESSION: Large retroperitoneal bleed on the left. Much of the hemorrhage appears to be within the nonspecific retroperitoneal space, with some apparent hemorrhage within a nearly 10 cm cyst at the lower pole the left kidney and within the left renal hilum and Peri renal space. Fluid tracks throughout the retroperitoneum on the left, including into a left inguinal hernia. I do not see a frank renal fracture. This is particularly concerning given that this is the solitary kidney. There is no abdominal aortic aneurysm. Chronic left pleural effusion with loculation and chronic volume loss in the left lower lobe. Small amount of intraperitoneal fluid, not grossly hyperdense. Cholelithiasis. Blood layering dependent within the bladder. Critical Value/emergent results were called by telephone at the time of interpretation on 01/26/2022 at 10:32 am to provider Gloris Manchester , who verbally acknowledged these results. Electronically Signed   By: Paulina Fusi M.D.   On: 01/19/2022 10:37    Procedure:  I personally hand irrigated his catheter this morning. There does not appear to be any obstruction of his catheter.  Hematuria noted.  Assessment/Plan: Retroperitoneal bleed secondary to traumatic fall with solitary left kidney: Bleed possibly from left kidney although any renal injury incompletely staged due to inability to receive contrast.  Agree with conservative management with bedrest, holding anticoagulation, serial H/H monitoring, and transfusion as needed.  Any intervention such as selective embolization  would be last resort only if absolutely needed due to significant risk of renal dysfunction with solitary kidney. Hematuria expected in this setting and should clear with above measures.  No evidence of clot obstruction.  AKI likely due to prerenal etiology.   Continue to follow renal function.     LOS: 1 day   Crecencio Mc 01/26/2022, 7:38 AM

## 2022-01-26 NOTE — Consult Note (Addendum)
[facility]  ?INPATIENT CONSULTATION ? ?Reason for Consultation: AKI on CKD ?Requesting Provider: Dr. Georganna Skeans ? ?HPI: [name] [name] is an 81 y.o. male with CKD 3a, solitary kidney, A fib, HL, h/o CVA, CAD h/o PCI 2008, GERD, spinal stenosis who is seen for evaluation and management of CKD and hyperkalemia.  ? ?Presented to ED yesterday after a fall with initial concern for STEMI prompting urgent LHC - found to have large acute L RP hemorrhage and LHC was aborted.  STEMI thought to be secondary to bleed.  Appears fall caused traumatic hemorrhage into a known large L renal cyst.  Admitted to ICU given shock - lacate 4.5, Hb 7.6 from baseline 10-11, BP as low as 76/64.   Given 2u pRBC, 2u FFP, volume resuscitation (net I/Os 6.1L / 0.2 UOP).  IR and urology are both consulting but no invasive interventions have been needed.   ? ?Since midnight pt has become anuric and labs this AM with K 6.1, BUN 31, Cr 2.3 (from 25, 1.8).  He is currently hemodynamically stable.  K has been medically managed with lokelma, IV ca.  Has been unresponsive to IV lasix.  ? ?Wife at bedside.  ? ?PMH: ?Past Medical History:  ?Diagnosis Date  ? Acute ischemic stroke ([place]) 09/29/2020  ? Acute myocardial infarction of other inferior wall, episode of care unspecified 03/02/2007  ? Arthritis   ? shoulders and ribs   ? Atrial fibrillation ([place])   ? atrial fib/ LOV [name] PA 04/11/12 EPIC, - CHEST X RAY, EKG 5/13 EPIC  ? CAD (coronary artery disease)   ?  s/p NSTEMI 04/08;  [place] 02/2007: Proximal LAD 75%, mid LAD 99%, proximal RCA 25%.  PCI:  Cypher DES to the proximal and mid LAD.  Last nuclear study 11/2011 (after a trip to the ED with CP): EF 58%, low risk study with small inferior wall infarct at the mid and basal level, no ischemia.  Last echo 01/2010: Mild LVH, EF 55-60%, mild AI, mild MR, severely dilated LA and RA  ? Closed fracture of rib of left side with delayed healing 07/25/2019  ? Contrast dye induced nephropathy   ? Hx ARF  secondary to contrast nephropathy  ? Dyslipidemia   ? Dysrhythmia   ? Elevated blood-pressure reading, without diagnosis of hypertension 11/07/2019  ? Epididymitis   ? Gallstones 05/30/2019  ? History of blood transfusion   ? Myocardial infarction [facility]) 2008  ? Pleurisy   ? 2012  ? Pneumonia   ? hx of   ? 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 Spinal stenosis   ? Urothelial cancer ([place])   ? Dr. [name],  skin cancer non melanoma  ? Wedge compression fracture of third lumbar vertebra with routine healing 05/30/2019  ? ?PSH: ?Past Surgical History:  ?Procedure Laterality Date  ? arm surgery    ? orif right elbow  ? CARDIAC CATHETERIZATION  2008  ? coronary stents     ? CYSTECTOMY  07/15/2008  ? CYSTOSCOPY WITH BIOPSY  03/23/2012  ? Procedure: CYSTOSCOPY WITH BIOPSY;  Surgeon: [name], MD;  Location: [facility];  Service: Urology;  Laterality: Right;   BRUSH BIOPSY ?RIGHT URETERAL STENT  ? CYSTOSCOPY WITH URETEROSCOPY  03/23/2012  ? Procedure: CYSTOSCOPY WITH URETEROSCOPY;  Surgeon: [name], MD;  Location: [facility];  Service: Urology;  Laterality: Right;   ? ?**OR Room #8 requested** ? ?C-ARM ?  ? CYSTOSCOPY/RETROGRADE/URETEROSCOPY  02/15/2012  ? Procedure: CYSTOSCOPY/RETROGRADE/URETEROSCOPY;  Surgeon: [name], MD;  Location: [facility];  Service: Urology;  Laterality: Right;  C-ARM  ? IR THORACENTESIS ASP PLEURAL SPACE W/IMG GUIDE  12/26/2020  ? IR THORACENTESIS ASP PLEURAL SPACE W/IMG GUIDE  08/03/2021  ? JOINT REPLACEMENT    ? Dr. [name] 09-05-18   ? left pinky finger     ? tip removed from accident  ? ROBOT ASSITED LAPAROSCOPIC NEPHROURETERECTOMY Right 2013  ? TOTAL KNEE ARTHROPLASTY Left 09/05/2018  ? Procedure: LEFT TOTAL KNEE ARTHROPLASTY;  Surgeon: [name], MD;  Location: [facility];  Service: Orthopedics;  Laterality: Left;  70 mins ?with abductor block  ? TOTAL KNEE ARTHROPLASTY Right 04/03/2019  ? Procedure: RIGHT TOTAL KNEE ARTHROPLASTY;  Surgeon: [name], MD;  Location: [facility];  Service: Orthopedics;   Laterality: Right;  70 mins  ? ? ?Past Medical History:  ?Diagnosis Date  ? Acute ischemic stroke ([place]) 09/29/2020  ? Acute myocardial infarction of other inferior wall, episode of care unspecified 03/02/2007  ? Arthritis   ? shoulders and ribs   ? Atrial fibrillation ([place])   ? atrial fib/ LOV [name] PA 04/11/12 EPIC, - CHEST X RAY, EKG 5/13 EPIC  ? CAD (coronary artery disease)   ?  s/p NSTEMI 04/08;  [place] 02/2007: Proximal LAD 75%, mid LAD 99%, proximal RCA 25%.  PCI:  Cypher DES to the proximal and mid LAD.  Last nuclear study 11/2011 (after a trip to the ED with CP): EF 58%, low risk study with small inferior wall infarct at the mid and basal level, no ischemia.  Last echo 01/2010: Mild LVH, EF 55-60%, mild AI, mild MR, severely dilated LA and RA  ? Closed fracture of rib of left side with delayed healing 07/25/2019  ? Contrast dye induced nephropathy   ? Hx ARF secondary to contrast nephropathy  ? Dyslipidemia   ? Dysrhythmia   ? Elevated blood-pressure reading, without diagnosis of hypertension 11/07/2019  ? Epididymitis   ? Gallstones 05/30/2019  ? History of blood transfusion   ? Myocardial infarction [facility]) 2008  ? Pleurisy   ? 2012  ? Pneumonia   ? hx of   ? 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 Spinal stenosis   ? Urothelial cancer ([place])   ? Dr. [name],  skin cancer non melanoma  ? Wedge compression fracture of third lumbar vertebra with routine healing 05/30/2019  ? ? ?Medications: ? I have reviewed the patient's current medications. ? ?Medications Prior to Admission  ?Medication Sig Dispense Refill  ? acetaminophen (TYLENOL) 650 MG CR tablet Take 1,300 mg by mouth every 8 (eight) hours as needed for pain.    ? atorvastatin (LIPITOR) 40 MG tablet Take 1 tablet (40 mg total) by mouth every evening. 90 tablet 3  ? calcitRIOL (ROCALTROL) 0.5 MCG capsule Take 0.5 mcg by mouth every morning.    ? cholecalciferol (VITAMIN D3) 25 MCG (1000 UNIT) tablet Take 1,000 Units by mouth every morning.    ? ELIQUIS 5 MG TABS tablet TAKE  1 TABLET TWICE DAILY (Patient taking differently: Take 5 mg by mouth 2 (two) times daily.) 180 tablet 1  ? furosemide (LASIX) 40 MG tablet Take 1 tablet (40 mg total) by mouth daily. 90 tablet 3  ? niacin (NIASPAN) 500 MG CR tablet Take 1 tablet (500 mg total) by mouth at bedtime.    ? nitroGLYCERIN (NITROSTAT) 0.4 MG SL tablet Place 1 tablet (0.4 mg total) under the tongue every 5 (five)  minutes as needed for chest pain. 25 tablet 4  ? Omega-3 Fatty Acids (FISH OIL) 1200 MG CAPS Take 1,200 mg by mouth every morning.    ? polyvinyl alcohol (ARTIFICIAL TEARS) 1.4 % ophthalmic solution Place 1 drop into both eyes at bedtime.    ? traZODone (DESYREL) 50 MG tablet Take 1.5 tablets (75 mg total) by mouth at bedtime as needed for sleep. (Patient taking differently: Take 75 mg by mouth at bedtime.) 135 tablet 3  ? vitamin B-12 (CYANOCOBALAMIN) 500 MCG tablet Take 500 mcg by mouth daily.    ? ? ?ALLERGIES: ?  ?Allergies  ?Allergen Reactions  ? Contrast Media [Iodinated Contrast Media] Other (See Comments)  ?  Contrast-induced nephropathy requiring dialysis in 02/2007.  ? Iodine-131 Other (See Comments)  ?  Kidneys shut down  ? Other Other (See Comments)  ?  Beta blockers cause bradycardia  ? Promethazine Other (See Comments)  ?  Pt became drowsy and mild altered mental status  ? ? ?FAM HX: ?Family History  ?Problem Relation Age of Onset  ? Cancer Mother   ? Diabetes type II Mother   ? Hyperlipidemia Mother   ? Coronary artery disease Father   ? Heart attack Father   ? ? ?Social History:  ? reports that he quit smoking about 40 years ago. His smoking use included cigarettes and cigars. He has a 20.00 pack-year smoking history. He has never used smokeless tobacco. He reports that he does not currently use alcohol. He reports that he does not use drugs. ? ?ROS: 12 system ROS neg except per HPI above ? ?Blood pressure (!) 106/53, pulse 84, temperature 98.1 ?F (36.7 ?C), temperature source Oral, resp. rate (!) 26, height '5\' 11"'$   (1.803 m), weight 79.4 kg, SpO2 100 %. ?PHYSICAL EXAM: ?Gen: elderly man who is sitting calmly at 90 degrees in bed  ?Eyes: anicteric ?ENT: MM tacky mouth breathing ?Neck: supple ?CV:  RRR, no rub ?Abd:  s

## 2022-01-26 NOTE — Progress Notes (Signed)
eLink Physician-Brief Progress Note ?Patient Name: [name] [name] ?DOB: [birth year] ?MRN: [id number] ? ? ?Date of Service ? 01/26/2022  ?HPI/Events of Note ? Code blue called, on getting in the room CPR is in progress, patient is asystolic, Epinephrine given x 2, Dr. Grandville Silos of the Trauma Service arrived and took over the running of the code blue.   ?eICU Interventions ? See above.  ? ? ? ?  ? ?[name] ?01/26/2022, 11:05 PM ?

## 2022-01-26 NOTE — TOC Initial Note (Signed)
Transition of Care (TOC) - Initial/Assessment Note  ? ? ?Patient Details  ?Name: [name] [name] ?MRN: [id number] ?Date of Birth: 04-24-1941 ? ?Transition of Care [facility]) CM/SW Contact:    ?Ella Bodo, RN ?Phone Number: ?01/26/2022, 4:23 PM ? ?Clinical Narrative:                 ?Pt s/p fall while on anticoagulation with left sided retroperitoneal bleed.  Pt with hemorrhagic shock and acute blood loss anemia; he is now anuric with nephrology consulting for possible dialysis.   ?PTA, pt independent and living at home with spouse.  PT/OT on hold until more stable; will follow progress.  ? ?Expected Discharge Plan: [place] ?Barriers to Discharge: Continued Medical Work up ? ? ?  ?  ? ?Expected Discharge Plan and Services ?Expected Discharge Plan: [place] ?  ?  ?  ?Living arrangements for the past 2 months: [place] ?                ?  ?  ?  ?  ?  ?  ?  ?  ?  ?  ? ?Prior Living Arrangements/Services ?Living arrangements for the past 2 months: [place] ?Lives with:: Spouse ?Patient language and need for interpreter reviewed:: Yes ?Do you feel safe going back to the place where you live?: Yes      ?Need for Family Participation in Patient Care: Yes (Comment) ?Care giver support system in place?: Yes (comment) ?  ?Criminal Activity/Legal Involvement Pertinent to Current Situation/Hospitalization: No - Comment as needed ? ?Activities of Daily Living ?Home Assistive Devices/Equipment: Gilford Rile (specify type) ("sometimes") ?ADL Screening (condition at time of admission) ?Patient's cognitive ability adequate to safely complete daily activities?: Yes ?Is the patient deaf or have difficulty hearing?: Yes ?Does the patient have difficulty seeing, even when wearing glasses/contacts?: No ?Does the patient have difficulty concentrating, remembering, or making decisions?: No ?Patient able to express need for assistance with ADLs?: Yes ?Does the patient have difficulty dressing or  bathing?: No ?Independently performs ADLs?: Yes (appropriate for developmental age) ?Does the patient have difficulty walking or climbing stairs?: No ?Weakness of Legs: None ?Weakness of Arms/Hands: None ? ?  ?   ?   ?   ?   ? ?Emotional Assessment ?  ?Attitude/Demeanor/Rapport: Engaged ?Affect (typically observed): Accepting ?Orientation: : Oriented to Self, Oriented to Place, Oriented to  Time, Oriented to Situation ?  ?  ? ?Admission diagnosis:  Trauma [T14.90XA] ?Renal hemorrhage, left [N28.89] ?Patient Active Problem List  ? Diagnosis Date Noted  ? Fall 01/24/2022  ? Renal hemorrhage, left 02/04/2022  ? Urothelial carcinoma ([place]) 02/07/2022  ? Encounter for general adult medical examination with abnormal findings 11/05/2021  ? Anemia due to stage 3a chronic kidney disease ([name]) 11/05/2021  ? Subclinical hypothyroidism 11/05/2021  ? Pleural effusion on left 10/13/2021  ? Onychomycosis 05/07/2021  ? Chronic sore throat 05/07/2021  ? Insomnia 01/07/2021  ? H/O: CVA (cerebrovascular accident) 09/29/2020  ? Lumbar degenerative disc disease 11/07/2019  ? Atrial fibrillation ([place]) 07/25/2019  ? S/P right total knee arthroplasty 04/03/2019  ? Body mass index (BMI) 26.0-26.9, adult 09/06/2018  ? History of nephrectomy 08/17/2018  ? Chronic low back pain 08/18/2017  ? OA (osteoarthritis) of knee 01/26/2017  ? 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 Gastroesophageal reflux disease 11/26/2011  ? Sinoatrial node dysfunction ([place]) 10/05/2008  ? Chronic kidney disease, stage III (moderate) ([place]) 10/05/2008  ?  CAD (coronary artery disease), native coronary artery 11/21/2007  ? Hypertrophy of prostate without urinary obstruction and other lower urinary tract symptoms (LUTS) 11/21/2007  ? Benign essential hypertension 12/05/2006  ? Sciatica 11/28/2006  ? ?PCP:  Lindell Spar, MD ?Pharmacy:   ?[id number] - [place], [address] ?[place] ?[place] [address] ?Phone: [phone] Fax: [phone] ? ?[place], [address] ?[address] ?[place] [address] ?Phone: [phone] Fax: [phone] ? ? ? ? ?Social Determinants of Health (SDOH) Interventions ?  ? ?Readmission Risk Interventions ?No flowsheet data found. ? ?[name] Raddle, RN, BSN  ?Trauma/Neuro ICU Case Manager ?[phone] ? ? ?

## 2022-01-26 NOTE — Progress Notes (Signed)
Dr. Bobbye Morton made aware that patient hasn't put out urine, and bladder scan showed approximately 70 mL in bladder. ?

## 2022-01-27 ENCOUNTER — Inpatient Hospital Stay (HOSPITAL_COMMUNITY): Payer: Medicare HMO

## 2022-01-27 DIAGNOSIS — N2889 Other specified disorders of kidney and ureter: Secondary | ICD-10-CM | POA: Diagnosis not present

## 2022-01-27 DIAGNOSIS — Z7189 Other specified counseling: Secondary | ICD-10-CM

## 2022-01-27 DIAGNOSIS — I469 Cardiac arrest, cause unspecified: Secondary | ICD-10-CM

## 2022-01-27 LAB — BASIC METABOLIC PANEL
Anion gap: 13 (ref 5–15)
BUN: 46 mg/dL — ABNORMAL HIGH (ref 8–23)
CO2: 19 mmol/L — ABNORMAL LOW (ref 22–32)
Calcium: 7.1 mg/dL — ABNORMAL LOW (ref 8.9–10.3)
Chloride: 101 mmol/L (ref 98–111)
Creatinine, Ser: 3.24 mg/dL — ABNORMAL HIGH (ref 0.61–1.24)
GFR, Estimated: 18 mL/min — ABNORMAL LOW (ref >60)
Glucose, Bld: 107 mg/dL — ABNORMAL HIGH (ref 70–99)
Potassium: 5.1 mmol/L (ref 3.5–5.1)
Sodium: 133 mmol/L — ABNORMAL LOW (ref 135–145)

## 2022-01-27 LAB — COMPREHENSIVE METABOLIC PANEL
ALT: 34 U/L (ref 0–44)
AST: 107 U/L — ABNORMAL HIGH (ref 15–41)
Albumin: 2.4 g/dL — ABNORMAL LOW (ref 3.5–5.0)
Alkaline Phosphatase: 60 U/L (ref 38–126)
Anion gap: 15 (ref 5–15)
BUN: 43 mg/dL — ABNORMAL HIGH (ref 8–23)
CO2: 17 mmol/L — ABNORMAL LOW (ref 22–32)
Calcium: 7.4 mg/dL — ABNORMAL LOW (ref 8.9–10.3)
Chloride: 101 mmol/L (ref 98–111)
Creatinine, Ser: 3.33 mg/dL — ABNORMAL HIGH (ref 0.61–1.24)
GFR, Estimated: 18 mL/min — ABNORMAL LOW (ref >60)
Glucose, Bld: 180 mg/dL — ABNORMAL HIGH (ref 70–99)
Potassium: 4.6 mmol/L (ref 3.5–5.1)
Sodium: 133 mmol/L — ABNORMAL LOW (ref 135–145)
Total Bilirubin: 0.6 mg/dL (ref 0.3–1.2)
Total Protein: 5.2 g/dL — ABNORMAL LOW (ref 6.5–8.1)

## 2022-01-27 LAB — RENAL FUNCTION PANEL
Albumin: 2.3 g/dL — ABNORMAL LOW (ref 3.5–5.0)
Anion gap: 9 (ref 5–15)
BUN: 55 mg/dL — ABNORMAL HIGH (ref 8–23)
CO2: 18 mmol/L — ABNORMAL LOW (ref 22–32)
Calcium: 7.2 mg/dL — ABNORMAL LOW (ref 8.9–10.3)
Chloride: 103 mmol/L (ref 98–111)
Creatinine, Ser: 3.93 mg/dL — ABNORMAL HIGH (ref 0.61–1.24)
GFR, Estimated: 15 mL/min — ABNORMAL LOW (ref >60)
Glucose, Bld: 137 mg/dL — ABNORMAL HIGH (ref 70–99)
Phosphorus: 4.8 mg/dL — ABNORMAL HIGH (ref 2.5–4.6)
Potassium: 5.7 mmol/L — ABNORMAL HIGH (ref 3.5–5.1)
Sodium: 130 mmol/L — ABNORMAL LOW (ref 135–145)

## 2022-01-27 LAB — CBC
HCT: 20.6 % — ABNORMAL LOW (ref 39.0–52.0)
HCT: 22.6 % — ABNORMAL LOW (ref 39.0–52.0)
HCT: 24.1 % — ABNORMAL LOW (ref 39.0–52.0)
Hemoglobin: 6.8 g/dL — CL (ref 13.0–17.0)
Hemoglobin: 7.4 g/dL — ABNORMAL LOW (ref 13.0–17.0)
Hemoglobin: 8 g/dL — ABNORMAL LOW (ref 13.0–17.0)
MCH: 30 pg (ref 26.0–34.0)
MCH: 30.1 pg (ref 26.0–34.0)
MCH: 32.2 pg (ref 26.0–34.0)
MCHC: 32.7 g/dL (ref 30.0–36.0)
MCHC: 33 g/dL (ref 30.0–36.0)
MCHC: 33.2 g/dL (ref 30.0–36.0)
MCV: 90.6 fL (ref 80.0–100.0)
MCV: 90.7 fL (ref 80.0–100.0)
MCV: 98.3 fL (ref 80.0–100.0)
Platelets: 166 10*3/uL (ref 150–400)
Platelets: 185 10*3/uL (ref 150–400)
Platelets: 198 10*3/uL (ref 150–400)
RBC: 2.27 MIL/uL — ABNORMAL LOW (ref 4.22–5.81)
RBC: 2.3 MIL/uL — ABNORMAL LOW (ref 4.22–5.81)
RBC: 2.66 MIL/uL — ABNORMAL LOW (ref 4.22–5.81)
RDW: 15.2 % (ref 11.5–15.5)
RDW: 15.3 % (ref 11.5–15.5)
RDW: 15.5 % (ref 11.5–15.5)
WBC: 16.1 10*3/uL — ABNORMAL HIGH (ref 4.0–10.5)
WBC: 16.2 10*3/uL — ABNORMAL HIGH (ref 4.0–10.5)
WBC: 19.4 10*3/uL — ABNORMAL HIGH (ref 4.0–10.5)
nRBC: 0 % (ref 0.0–0.2)
nRBC: 0 % (ref 0.0–0.2)
nRBC: 0.3 % — ABNORMAL HIGH (ref 0.0–0.2)

## 2022-01-27 LAB — GLUCOSE, CAPILLARY
Glucose-Capillary: 78 mg/dL (ref 70–99)
Glucose-Capillary: 87 mg/dL (ref 70–99)
Glucose-Capillary: 153 mg/dL — ABNORMAL HIGH (ref 70–99)

## 2022-01-27 LAB — POCT I-STAT 7, (LYTES, BLD GAS, ICA,H+H)
Acid-base deficit: 8 mmol/L — ABNORMAL HIGH (ref 0.0–2.0)
Bicarbonate: 18.4 mmol/L — ABNORMAL LOW (ref 20.0–28.0)
Calcium, Ion: 1.09 mmol/L — ABNORMAL LOW (ref 1.15–1.40)
HCT: 20 % — ABNORMAL LOW (ref 39.0–52.0)
Hemoglobin: 6.8 g/dL — CL (ref 13.0–17.0)
O2 Saturation: 95 %
Patient temperature: 96.3
Potassium: 5.1 mmol/L (ref 3.5–5.1)
Sodium: 134 mmol/L — ABNORMAL LOW (ref 135–145)
TCO2: 20 mmol/L — ABNORMAL LOW (ref 22–32)
pCO2 arterial: 39.8 mmHg (ref 32–48)
pH, Arterial: 7.266 — ABNORMAL LOW (ref 7.35–7.45)
pO2, Arterial: 80 mmHg — ABNORMAL LOW (ref 83–108)

## 2022-01-27 LAB — PREPARE RBC (CROSSMATCH)

## 2022-01-27 LAB — MAGNESIUM: Magnesium: 1.7 mg/dL (ref 1.7–2.4)

## 2022-01-27 LAB — PHOSPHORUS: Phosphorus: 4.7 mg/dL — ABNORMAL HIGH (ref 2.5–4.6)

## 2022-01-27 MED ORDER — SODIUM CHLORIDE 0.9% FLUSH
10.0000 mL | Freq: Two times a day (BID) | INTRAVENOUS | Status: DC
  Administered 2022-01-27 (×3): 10 mL
  Administered 2022-01-28: 20 mL
  Administered 2022-01-28 – 2022-01-29 (×2): 10 mL

## 2022-01-27 MED ORDER — PRISMASOL BGK 4/2.5 32-4-2.5 MEQ/L EC SOLN
Status: DC
  Filled 2022-01-27 (×18): qty 5000

## 2022-01-27 MED ORDER — PANTOPRAZOLE 2 MG/ML SUSPENSION
40.0000 mg | Freq: Every day | ORAL | Status: DC
  Administered 2022-01-28 – 2022-01-29 (×2): 40 mg
  Filled 2022-01-27 (×2): qty 20

## 2022-01-27 MED ORDER — PROPOFOL 1000 MG/100ML IV EMUL
INTRAVENOUS | Status: AC
  Filled 2022-01-27: qty 100

## 2022-01-27 MED ORDER — SODIUM CHLORIDE 0.9 % IV SOLN: INTRAVENOUS | Status: DC | PRN

## 2022-01-27 MED ORDER — PROSOURCE TF PO LIQD
45.0000 mL | Freq: Two times a day (BID) | ORAL | Status: DC
  Administered 2022-01-27 – 2022-01-28 (×2): 45 mL
  Filled 2022-01-27 (×2): qty 45

## 2022-01-27 MED ORDER — ACETAMINOPHEN 325 MG PO TABS: 650.0000 mg | ORAL_TABLET | Freq: Three times a day (TID) | ORAL | Status: DC | PRN

## 2022-01-27 MED ORDER — HEPARIN SODIUM (PORCINE) 1000 UNIT/ML DIALYSIS
1000.0000 [IU] | INTRAMUSCULAR | Status: DC | PRN
  Administered 2022-01-28 – 2022-01-29 (×2): 3000 [IU] via INTRAVENOUS_CENTRAL
  Filled 2022-01-27: qty 4
  Filled 2022-01-27: qty 6
  Filled 2022-01-27: qty 2
  Filled 2022-01-27 (×2): qty 6

## 2022-01-27 MED ORDER — TRAZODONE HCL 50 MG PO TABS: 75.0000 mg | ORAL_TABLET | Freq: Every evening | ORAL | Status: DC | PRN

## 2022-01-27 MED ORDER — PROPOFOL 1000 MG/100ML IV EMUL
5.0000 ug/kg/min | INTRAVENOUS | Status: DC
  Administered 2022-01-27: 5 ug/kg/min via INTRAVENOUS

## 2022-01-27 MED ORDER — SODIUM CHLORIDE 0.9 % FOR CRRT
INTRAVENOUS_CENTRAL | Status: DC | PRN
  Administered 2022-01-29: 2000 mL via INTRAVENOUS_CENTRAL

## 2022-01-27 MED ORDER — CALCITRIOL 0.5 MCG PO CAPS
0.5000 ug | ORAL_CAPSULE | Freq: Every day | ORAL | Status: DC
  Filled 2022-01-27: qty 1

## 2022-01-27 MED ORDER — SODIUM ZIRCONIUM CYCLOSILICATE 10 G PO PACK
10.0000 g | PACK | Freq: Once | ORAL | Status: AC
  Administered 2022-01-27: 10 g
  Filled 2022-01-27: qty 1

## 2022-01-27 MED ORDER — VITAL 1.5 CAL PO LIQD
1000.0000 mL | ORAL | Status: DC
  Administered 2022-01-27: 1000 mL
  Filled 2022-01-27: qty 1000

## 2022-01-27 MED ORDER — SODIUM CHLORIDE 0.9% FLUSH: 10.0000 mL | INTRAVENOUS | Status: DC | PRN

## 2022-01-27 MED ORDER — PRISMASOL BGK 4/2.5 32-4-2.5 MEQ/L REPLACEMENT SOLN
Status: DC
  Filled 2022-01-27 (×6): qty 5000

## 2022-01-27 MED ORDER — VITAL HIGH PROTEIN PO LIQD
1000.0000 mL | ORAL | Status: DC
  Administered 2022-01-27: 1000 mL

## 2022-01-27 MED ORDER — OXYCODONE HCL 5 MG PO TABS
5.0000 mg | ORAL_TABLET | ORAL | Status: DC | PRN
  Filled 2022-01-27: qty 1

## 2022-01-27 MED ORDER — OXYCODONE HCL 5 MG PO TABS
10.0000 mg | ORAL_TABLET | ORAL | Status: DC | PRN
  Administered 2022-01-28 – 2022-01-29 (×6): 10 mg
  Filled 2022-01-27 (×6): qty 2

## 2022-01-27 MED ORDER — SODIUM CHLORIDE 0.9% IV SOLUTION
Freq: Once | INTRAVENOUS | Status: DC
Start: 2022-01-27 — End: 2022-01-29

## 2022-01-27 NOTE — Progress Notes (Signed)
RT attempted ABG X 2 without success.  VBG requested.  RT will continue to monitor.  Patient sats 100% on 0.70 FiO2.  Will titrate as tolerated. ?

## 2022-01-27 NOTE — Procedures (Signed)
Arterial Catheter Insertion Procedure Note ? Derreck Wiltsey  ?[id number]  ?03-22-1941 ? ?Date:01/27/22  ?Time:8:48 AM  ? ? ?Provider Performing: Lynann Bologna  ? ? ?Procedure: Insertion of Arterial Line [phone]) without US guidance ? ?Indication(s) ?Blood pressure monitoring and/or need for frequent ABGs ? ?Consent ?Risks of the procedure as well as the alternatives and risks of each were explained to the patient and/or caregiver.  Consent for the procedure was obtained and is signed in the bedside chart ? ?Anesthesia ?None ? ? ?Time Out ?Verified patient identification, verified procedure, site/side was marked, verified correct patient position, special equipment/implants available, medications/allergies/relevant history reviewed, required imaging and test results available. ? ? ?Sterile Technique ?Maximal sterile technique including full sterile barrier drape, hand hygiene, sterile gown, sterile gloves, mask, hair covering, sterile ultrasound probe cover (if used). ? ? ?Procedure Description ?Area of catheter insertion was cleaned with chlorhexidine and draped in sterile fashion. Without real-time ultrasound guidance an arterial catheter was placed into the left radial artery.  Appropriate arterial tracings confirmed on monitor.   ? ? ?Complications/Tolerance ?None; patient tolerated the procedure well. ? ? ?EBL ?Minimal ? ? ?Specimen(s) ?None ? ?

## 2022-01-27 NOTE — Progress Notes (Signed)
Dr. Grandville Silos notified at 2155 of bradycardic and unresponsive episode which occurred at 2149. Patient regained consciousness and heart rate returned to baseline. New orders for 12 lead ECG. ECG completed. Patient had another episode around 2209, again returning to baseline heart rate and consciousness. Dr. Grandville Silos notified. No new orders. Patient had another episode around 2246. Atropine given. Patient loss pulse at 2252. ACLS started. Dr. Grandville Silos came to bedside. See physician notes.  ?

## 2022-01-27 NOTE — Progress Notes (Signed)
? ?Progress Note ? ?Patient Name: [name] [name] ?Date of Encounter: 01/27/2022 ? ?[facility] Cardiologist: Sherren Mocha, MD  ? ?Subjective  ? ?Intubated, sedated. Overnight events noted, bradycardic arrest/CPR/respiratory failure. ? ?Inpatient Medications  ?  ?Scheduled Meds: ? sodium chloride   Intravenous Once  ? sodium chloride   Intravenous Once  ? sodium chloride   Intravenous Once  ? [START ON 01/28/2022] calcitRIOL  0.5 mcg Per Tube Daily  ? chlorhexidine gluconate (MEDLINE KIT)  15 mL Mouth Rinse BID  ? Chlorhexidine Gluconate Cloth  6 each Topical Daily  ? feeding supplement (PROSource TF)  45 mL Per Tube BID  ? fentaNYL (SUBLIMAZE) injection  25 mcg Intravenous Once  ? mouth rinse  15 mL Mouth Rinse 10 times per day  ? [START ON 01/28/2022] pantoprazole sodium  40 mg Per Tube Daily  ? sodium chloride flush  10-40 mL Intracatheter Q12H  ? ?Continuous Infusions: ?  prismasol BGK 4/2.5    ?  prismasol BGK 4/2.5    ? sodium chloride 75 mL/hr at 01/27/22 1600  ? sodium chloride Stopped (01/26/22 1720)  ? sodium chloride    ? feeding supplement (VITAL 1.5 CAL)    ? fentaNYL infusion INTRAVENOUS 100 mcg/hr (01/27/22 1600)  ? norepinephrine (LEVOPHED) Adult infusion Stopped (01/27/22 1444)  ? prismasol BGK 4/2.5    ? propofol (DIPRIVAN) infusion Stopped (01/27/22 1540)  ? ?PRN Meds: ?sodium chloride, Place/Maintain arterial line **AND** sodium chloride, acetaminophen, fentaNYL, heparin, morphine injection, nitroGLYCERIN, ondansetron **OR** ondansetron (ZOFRAN) IV, oxyCODONE, oxyCODONE, sodium chloride, sodium chloride flush, traZODone  ? ?Vital Signs  ?  ?Vitals:  ? 01/27/22 1500 01/27/22 1527 01/27/22 1600 01/27/22 1700  ?BP: 111/64  108/74   ?Pulse: 89 93 81 89  ?Resp: 14 20 (!) 0 18  ?Temp:   98.4 ?F (36.9 ?C)   ?TempSrc:   Axillary   ?SpO2: 100% 100% 100% 100%  ?Weight:      ?Height:      ? ? ?Intake/Output Summary (Last 24 hours) at 01/27/2022 1805 ?Last data filed at 01/27/2022 1600 ?Gross per 24 hour   ?Intake 2256.4 ml  ?Output 11 ml  ?Net 2245.4 ml  ? ?Last 3 Weights 02/02/2022 02/03/2022 11/03/2021  ?Weight (lbs) 175 lb 175 lb 182 lb 0.6 oz  ?Weight (kg) 79.379 kg 79.379 kg 82.573 kg  ?   ? ?Telemetry  ?  ?Atrial fibrillation, overnight marked bradycardia noted - Personally Reviewed ? ? ? ?Physical Exam  ?Intubated, sedated ?GEN: No acute distress.   ?Neck: No JVD ?Cardiac: irregular, no murmurs, rubs, or gallops.  ?Respiratory: Clear to auscultation bilaterally. ?GI: Soft, nontender, non-distended  ?MS: 1+ BL pretibial edema. No deformity. ?Neuro:  Nonfocal  ?Psych: Normal affect  ? ?Labs  ?  ?High Sensitivity Troponin:   ?Recent Labs  ?Lab 01/21/2022 ?0920 02/09/2022 ?1120  ?TROPONINIHS 23* 57*  ?   ?Chemistry ?Recent Labs  ?Lab 01/15/2022 ?0920 01/21/2022 ?0928 01/26/22 ?2358 01/27/22 ?0127 01/27/22 ?2637 01/27/22 ?1600  ?NA 139   < > 133* 134* 133* 130*  ?K 3.7   < > 4.6 5.1 5.1 5.7*  ?CL 102   < > 101  --  101 103  ?CO2 24   < > 17*  --  19* 18*  ?GLUCOSE 200*   < > 180*  --  107* 137*  ?BUN 21   < > 43*  --  46* 55*  ?CREATININE 1.46*   < > 3.33*  --  3.24* 3.93*  ?  CALCIUM 8.6*   < > 7.4*  --  7.1* 7.2*  ?MG 1.8  --   --   --   --  1.7  ?PROT 6.3*  --  5.2*  --   --   --   ?ALBUMIN 2.9*  --  2.4*  --   --  2.3*  ?AST 23  --  107*  --   --   --   ?ALT 13  --  34  --   --   --   ?ALKPHOS 69  --  60  --   --   --   ?BILITOT 1.0  --  0.6  --   --   --   ?GFRNONAA 48*   < > 18*  --  18* 15*  ?ANIONGAP 13   < > 15  --  13 9  ? < > = values in this interval not displayed.  ?  ?Lipids No results for input(s): CHOL, TRIG, HDL, LABVLDL, LDLCALC, CHOLHDL in the last 168 hours.  ?Hematology ?Recent Labs  ?Lab 01/26/22 ?2358 01/27/22 ?0127 01/27/22 ?7322 01/27/22 ?1145  ?WBC 19.4*  --  16.2* 16.1*  ?RBC 2.30*  --  2.27* 2.66*  ?HGB 7.4* 6.8* 6.8* 8.0*  ?HCT 22.6* 20.0* 20.6* 24.1*  ?MCV 98.3  --  90.7 90.6  ?MCH 32.2  --  30.0 30.1  ?MCHC 32.7  --  33.0 33.2  ?RDW 15.5  --  15.3 15.2  ?PLT 198  --  185 166  ? ?Thyroid No  results for input(s): TSH, FREET4 in the last 168 hours.  ?BNPNo results for input(s): BNP, PROBNP in the last 168 hours.  ?DDimer No results for input(s): DDIMER in the last 168 hours.  ? ?Radiology  ?  ?DG CHEST PORT 1 VIEW ? ?Result Date: 01/27/2022 ?CLINICAL DATA:  Central line placement.  Ventilator support. EXAM: PORTABLE CHEST 1 VIEW COMPARISON:  Earlier same day FINDINGS: New right internal jugular central line tip in the SVC at the level of the innominate vein entrance. Left-sided central line tip as its tip at the innominate SVC junction. Endotracheal tube tip 5 cm above the carina. Orogastric or nasogastric tube enters the abdomen. Persistent infiltrate/volume loss in the lower lobes, left worse than right. New patchy density in the right upper lobe. IMPRESSION: New right internal jugular central line tip in the SVC at the level of the innominate vein confluence. No pneumothorax. Persistent lower lobe infiltrate/atelectasis, left worse than right. New patchy density in the right upper lobe. Electronically Signed   By: Nelson Chimes M.D.   On: 01/27/2022 15:52  ? ?DG CHEST PORT 1 VIEW ? ?Result Date: 01/27/2022 ?CLINICAL DATA:  Central line placement EXAM: PORTABLE CHEST 1 VIEW COMPARISON:  01/26/2022 FINDINGS: Endotracheal tube with tip measuring 5.8 cm above the carina. Enteric tube tip is off the field of view. A left central venous catheter has been placed with tip projecting horizontally in the mediastinum with consistent with location at the confluence of the superior vena cava and brachiocephalic veins. No pneumothorax. Shallow inspiration. Heart size is normal for technique. Patchy right perihilar and left basilar infiltrates. Probable small left pleural effusion. Coronary artery stents are suggested. IMPRESSION: 1. Left central venous catheter tip projects over the expected confluence of the superior vena cava and brachiocephalic veins. No pneumothorax. 2. Patchy infiltrates in the lungs with small  left pleural effusion. Electronically Signed   By: Lucienne Capers M.D.   On: 01/27/2022 00:36  ? ?  DG CHEST PORT 1 VIEW ? ?Result Date: 01/26/2022 ?CLINICAL DATA:  Intubation. EXAM: PORTABLE CHEST 1 VIEW COMPARISON:  Chest radiograph dated 01/26/2022. FINDINGS: The tip of an endotracheal tube is partially seen approximately 7 cm above the carina. An enteric tube with side-port in the distal esophagus and tip in the proximal stomach. Recommend further advancing by additional 8 cm. Cardiomegaly with vascular congestion and edema. Small left pleural effusion and left lung base atelectasis or infiltrate. No pneumothorax. No acute osseous pathology. IMPRESSION: 1. Endotracheal tube above the carina. 2. Enteric tube with side-port in the distal esophagus and tip in the proximal stomach. Recommend further advancing by additional 8 cm. 3. Cardiomegaly with vascular congestion and edema. 4. Small left pleural effusion and left lung base atelectasis or infiltrate. Electronically Signed   By: Anner Crete M.D.   On: 01/26/2022 23:30  ? ?DG CHEST PORT 1 VIEW ? ?Result Date: 01/26/2022 ?CLINICAL DATA:  A male age 81 presents for evaluation of respiratory difficulty. EXAM: PORTABLE CHEST 1 VIEW COMPARISON:  January 25, 2022. FINDINGS: Diminished lung volumes compared to the recent comparison evaluation. Graded opacity now in the RIGHT chest and increasing opacity in the LEFT lung base compared to the recent comparison study. Cardiomediastinal contours suggest cardiomegaly unchanged. Signs of pulmonary vascular engorgement. On limited assessment there is no acute skeletal process. IMPRESSION: 1. Bilateral pleural effusions, RIGHT greater than LEFT. 2. Stable cardiomegaly with pulmonary vascular engorgement. 3. Increasing opacity at the lung bases may reflect worsening volume loss in the setting of diminished lung volumes. Attention on follow-up Electronically Signed   By: Zetta Bills M.D.   On: 01/26/2022 14:06  ? ?DG Abd  Portable 1V ? ?Result Date: 01/27/2022 ?CLINICAL DATA:  Orogastric tube placement EXAM: PORTABLE ABDOMEN - 1 VIEW COMPARISON:  Abdominal x-ray 09/10/2018 FINDINGS: Enteric tube tip is in the upper abdomen to the right

## 2022-01-27 NOTE — Progress Notes (Signed)
This chaplain received confirmation of visit request from Father Barnabas Lister by phone. The chaplain understands Father Barnabas Lister is not able to visit until after 4pm today. This chaplain will refer the update to unit chaplain-Trish. ? ?Chaplain Sallyanne Kuster ?[phone] ?

## 2022-01-27 NOTE — Procedures (Signed)
Central line ? ?Date/Time: 01/27/2022 12:00 AM ?Performed by: Georganna Skeans, MD ?Authorized by: Georganna Skeans, MD  ? ?Consent:  ?  Consent obtained:  Emergent situation ?Universal protocol:  ?  Immediately prior to procedure, a time out was called: yes   ?Pre-procedure details:  ?  Indication(s): central venous access   ?  Hand hygiene: Hand hygiene performed prior to insertion   ?  Sterile barrier technique: All elements of maximal sterile technique followed   ?  Skin preparation:  Chlorhexidine ?Procedure details:  ?  Location:  L subclavian ?  Patient position:  Trendelenburg ?  Procedural supplies:  Triple lumen ?  Catheter size:  7 Fr ?  Landmarks identified: yes   ?  Ultrasound guidance: no   ?  Number of attempts:  1 ?  Successful placement: yes   ?Post-procedure details:  ?  Post-procedure:  Line sutured and dressing applied ?  Assessment:  Blood return through all ports ?  Procedure completion:  Tolerated ?CXR pending ?Georganna Skeans, MD, MPH, FACS ?Please use [email or web address] to contact on call provider ? ?

## 2022-01-27 NOTE — Progress Notes (Signed)
? ?Trauma/Critical Care Follow Up Note ? ?Subjective:  ?  ?Overnight Issues:  ? ?Objective:  ?Vital signs for last 24 hours: ?Temp:  [96.3 ?F (35.7 ?C)-100.5 ?F (38.1 ?C)] 100.2 ?F (37.9 ?C) (03/15 1200) ?Pulse Rate:  [73-135] 84 (03/15 1400) ?Resp:  [0-88] 22 (03/15 1400) ?BP: (48-153)/(26-105) 141/81 (03/15 1400) ?SpO2:  [82 %-100 %] 100 % (03/15 1400) ?Arterial Line BP: (100-159)/(48-74) 154/72 (03/15 1400) ?FiO2 (%):  [60 %-100 %] 60 % (03/15 1121) ? ?Hemodynamic parameters for last 24 hours: ?  ? ?Intake/Output from previous day: ?03/14 0701 - 03/15 0700 ?In: 2656.1 [I.V.:2229.1; Blood:315; IV Piggyback:112] ?Out: 16 [Urine:16]  ?Intake/Output this shift: ?Total I/O ?In: 917.3 [I.V.:602.3; Blood:315] ?Out: -  ? ?Vent settings for last 24 hours: ?Vent Mode: PRVC ?FiO2 (%):  [60 %-100 %] 60 % ?Set Rate:  [20 bmp] 20 bmp ?Vt Set:  [600 mL-620 mL] 620 mL ?PEEP:  [5 cmH20] 5 cmH20 ?Plateau Pressure:  [19 cmH20-26 cmH20] 24 cmH20 ? ?Physical Exam:  ?Gen: comfortable, no distress ?Neuro: non-focal exam ?HEENT: PERRL ?Neck: supple ?CV: RRR ?Pulm: unlabored breathing ?Abd: soft, NT ?GU: anuric ?Extr: wwp, no edema ? ? ?Results for orders placed or performed during the hospital encounter of 02/08/2022 (from the past 24 hour(s))  ?Hemoglobin and hematocrit, blood     Status: Abnormal  ? Collection Time: 01/26/22  8:13 PM  ?Result Value Ref Range  ? Hemoglobin 8.0 (L) 13.0 - 17.0 g/dL  ? HCT 24.8 (L) 39.0 - 52.0 %  ?CBC     Status: Abnormal  ? Collection Time: 01/26/22 11:58 PM  ?Result Value Ref Range  ? WBC 19.4 (H) 4.0 - 10.5 K/uL  ? RBC 2.30 (L) 4.22 - 5.81 MIL/uL  ? Hemoglobin 7.4 (L) 13.0 - 17.0 g/dL  ? HCT 22.6 (L) 39.0 - 52.0 %  ? MCV 98.3 80.0 - 100.0 fL  ? MCH 32.2 26.0 - 34.0 pg  ? MCHC 32.7 30.0 - 36.0 g/dL  ? RDW 15.5 11.5 - 15.5 %  ? Platelets 198 150 - 400 K/uL  ? nRBC 0.3 (H) 0.0 - 0.2 %  ?Comprehensive metabolic panel     Status: Abnormal  ? Collection Time: 01/26/22 11:58 PM  ?Result Value Ref Range  ?  Sodium 133 (L) 135 - 145 mmol/L  ? Potassium 4.6 3.5 - 5.1 mmol/L  ? Chloride 101 98 - 111 mmol/L  ? CO2 17 (L) 22 - 32 mmol/L  ? Glucose, Bld 180 (H) 70 - 99 mg/dL  ? BUN 43 (H) 8 - 23 mg/dL  ? Creatinine, Ser 3.33 (H) 0.61 - 1.24 mg/dL  ? Calcium 7.4 (L) 8.9 - 10.3 mg/dL  ? Total Protein 5.2 (L) 6.5 - 8.1 g/dL  ? Albumin 2.4 (L) 3.5 - 5.0 g/dL  ? AST 107 (H) 15 - 41 U/L  ? ALT 34 0 - 44 U/L  ? Alkaline Phosphatase 60 38 - 126 U/L  ? Total Bilirubin 0.6 0.3 - 1.2 mg/dL  ? GFR, Estimated 18 (L) >60 mL/min  ? Anion gap 15 5 - 15  ?I-STAT 7, (LYTES, BLD GAS, ICA, H+H)     Status: Abnormal  ? Collection Time: 01/27/22  1:27 AM  ?Result Value Ref Range  ? pH, Arterial 7.266 (L) 7.35 - 7.45  ? pCO2 arterial 39.8 32 - 48 mmHg  ? pO2, Arterial 80 (L) 83 - 108 mmHg  ? Bicarbonate 18.4 (L) 20.0 - 28.0 mmol/L  ? TCO2 20 (L) 22 -  32 mmol/L  ? O2 Saturation 95 %  ? Acid-base deficit 8.0 (H) 0.0 - 2.0 mmol/L  ? Sodium 134 (L) 135 - 145 mmol/L  ? Potassium 5.1 3.5 - 5.1 mmol/L  ? Calcium, Ion 1.09 (L) 1.15 - 1.40 mmol/L  ? HCT 20.0 (L) 39.0 - 52.0 %  ? Hemoglobin 6.8 (LL) 13.0 - 17.0 g/dL  ? Patient temperature 96.3 F   ? Collection site RADIAL, ALLEN'S TEST ACCEPTABLE   ? Drawn by HIDE   ? Sample type ARTERIAL   ? Comment NOTIFIED PHYSICIAN   ?Basic metabolic panel     Status: Abnormal  ? Collection Time: 01/27/22  4:57 AM  ?Result Value Ref Range  ? Sodium 133 (L) 135 - 145 mmol/L  ? Potassium 5.1 3.5 - 5.1 mmol/L  ? Chloride 101 98 - 111 mmol/L  ? CO2 19 (L) 22 - 32 mmol/L  ? Glucose, Bld 107 (H) 70 - 99 mg/dL  ? BUN 46 (H) 8 - 23 mg/dL  ? Creatinine, Ser 3.24 (H) 0.61 - 1.24 mg/dL  ? Calcium 7.1 (L) 8.9 - 10.3 mg/dL  ? GFR, Estimated 18 (L) >60 mL/min  ? Anion gap 13 5 - 15  ?CBC     Status: Abnormal  ? Collection Time: 01/27/22  4:57 AM  ?Result Value Ref Range  ? WBC 16.2 (H) 4.0 - 10.5 K/uL  ? RBC 2.27 (L) 4.22 - 5.81 MIL/uL  ? Hemoglobin 6.8 (LL) 13.0 - 17.0 g/dL  ? HCT 20.6 (L) 39.0 - 52.0 %  ? MCV 90.7 80.0 - 100.0 fL   ? MCH 30.0 26.0 - 34.0 pg  ? MCHC 33.0 30.0 - 36.0 g/dL  ? RDW 15.3 11.5 - 15.5 %  ? Platelets 185 150 - 400 K/uL  ? nRBC 0.0 0.0 - 0.2 %  ?Prepare RBC (crossmatch)     Status: None  ? Collection Time: 01/27/22  5:50 AM  ?Result Value Ref Range  ? Order Confirmation    ?  ORDER PROCESSED BY BLOOD BANK BB SAMPLE OR UNITS ALREADY AVAILABLE ?Performed at [facility], [place] [address] [place], [place] [address] ?  ?CBC     Status: Abnormal  ? Collection Time: 01/27/22 11:45 AM  ?Result Value Ref Range  ? WBC 16.1 (H) 4.0 - 10.5 K/uL  ? RBC 2.66 (L) 4.22 - 5.81 MIL/uL  ? Hemoglobin 8.0 (L) 13.0 - 17.0 g/dL  ? HCT 24.1 (L) 39.0 - 52.0 %  ? MCV 90.6 80.0 - 100.0 fL  ? MCH 30.1 26.0 - 34.0 pg  ? MCHC 33.2 30.0 - 36.0 g/dL  ? RDW 15.2 11.5 - 15.5 %  ? Platelets 166 150 - 400 K/uL  ? nRBC 0.0 0.0 - 0.2 %  ? ? ?Assessment & Plan: ?The plan of care was discussed with the bedside nurse for the day, who is in agreement with this plan and no additional concerns were raised.  ? ?Present on Admission: ? Fall ? Renal hemorrhage, left ? ? ? LOS: 2 days  ? ?Additional comments:I reviewed the patient's new clinical lab test results.   and I reviewed the patients new imaging test results.   ? ?Fall ? 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  2/2 pRBCs/FFP given along with KCentra for reversal.  Discussed with IR who did not want to proceed with embolization given this is patient's only kidney and risk for renal failure  or compromise.  Will treat conservatively for now and monitor his labs closely.  Appreciate Urology evaluation and F/U ?ABL anemia - secondary to above. Hb has drifted but has been aggressively volume resuscitated. 1u PRBC this AM for hgb 6.8 ?AKI, oliguric - despite multiple boluses and lasix challenge. Renal requesting HD cath with plan for RRT 3/16. Dr. Johnney Ou seeing. ?Hyperkalemia - due to above, lokelma given, give another '10mg'$  now ?Elevated  ST/demand ischemia - initially concerned about STEMI, but given bleed and hypotension secondary to bleed, felt to be demand ischemia and not STEMI. ?Bradycardic arrest with ROSC late 3/13 - Dr. 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  Foley in place 3/13.   ?FEN - TF, lokelma for hyperkalemia ?VTE - on hold due to above, SCDs ?ID - none currently needed ?Dispo - ICU, palliative c/s ? ?Lengthy discussion with patient's wife and daughters about critical state. Broached the conversation about goals of care and for wife to begin thinking about what decisions he would make for himself if we get to the point of poor neurologic recovery post-CPR +/- prolonged need for mechanical ventilation, etc. Palliative consult placed to help explore this further.  ? ?Critical Care Total Time: 80 minutes ? ?Jesusita Oka, MD ?Trauma & General Surgery ?Please use [email or web address] to contact on call provider ? ?01/27/2022 ? ?*Care during the described time interval was provided by me. I have reviewed this patient's available data, including medical history, events of note, physical examination and test results as part of my evaluation. ? ? ? ?

## 2022-01-27 NOTE — Progress Notes (Signed)
RT note: Patient had a et tube cuff leak and cxr showed et tube 5cm above carina. RT and RN pushed et tube in 2cm and repeat cxr order. Patient currently has no cuff leak and vital signs are stable. ?

## 2022-01-27 NOTE — Progress Notes (Signed)
?[facility] ?Progress Note  ? ?Subjective:   Bradycardic arrest last night - CPR x 5 min, intubated. Remains anuric.  ? ?Objective ?Vitals:  ? 01/27/22 0800 01/27/22 0815 01/27/22 0900 01/27/22 1000  ?BP: 96/70  114/66 116/67  ?Pulse: 88 85 77   ?Resp: (!) 21 16 (!) 0 (!) 21  ?Temp:      ?TempSrc:      ?SpO2: 100% 100% 99%   ?Weight:      ?Height:      ? ?Physical Exam ?General: elderly man intubated and sedated ?Heart: irreg irreg, no rub ?Lungs: coarse BL, dec BS bases ?Abdomen: soft, mod distended, non tender ?Extremities: trace edema ? ?Additional Objective ?Labs: ?Basic Metabolic Panel: ?Recent Labs  ?Lab 01/26/22 ?1332 01/26/22 ?2358 01/27/22 ?0127 01/27/22 ?3354  ?NA 137 133* 134* 133*  ?K 5.1 4.6 5.1 5.1  ?CL 105 101  --  101  ?CO2 21* 17*  --  19*  ?GLUCOSE 163* 180*  --  107*  ?BUN 39* 43*  --  46*  ?CREATININE 2.96* 3.33*  --  3.24*  ?CALCIUM 7.8* 7.4*  --  7.1*  ? ?Liver Function Tests: ?Recent Labs  ?Lab 01/24/2022 ?0920 01/26/22 ?2358  ?AST 23 107*  ?ALT 13 34  ?ALKPHOS 69 60  ?BILITOT 1.0 0.6  ?PROT 6.3* 5.2*  ?ALBUMIN 2.9* 2.4*  ? ?No results for input(s): LIPASE, AMYLASE in the last 168 hours. ?CBC: ?Recent Labs  ?Lab 01/26/22 ?0140 01/26/22 ?0722 01/26/22 ?1332 01/26/22 ?2013 01/26/22 ?2358 01/27/22 ?0127 01/27/22 ?5625  ?WBC 15.4* 17.6* 19.0*  --  19.4*  --  16.2*  ?HGB 8.3* 7.6* 6.8*   < > 7.4* 6.8* 6.8*  ?HCT 25.4* 24.1* 21.1*   < > 22.6* 20.0* 20.6*  ?MCV 94.1 93.8 93.4  --  98.3  --  90.7  ?PLT 151 192 199  --  198  --  185  ? < > = values in this interval not displayed.  ? ?Blood Culture ?   ?Component Value Date/Time  ? SDES FLUID PLEURAL LEFT 08/03/2021 1512  ? SDES FLUID PLEURAL 08/03/2021 1512  ? SPECREQUEST BOTTLES DRAWN AEROBIC AND ANAEROBIC 08/03/2021 1512  ? SPECREQUEST BOTTLES DRAWN AEROBIC AND ANAEROBIC 08/03/2021 1512  ? CULT  08/03/2021 1512  ?  NO GROWTH 7 DAYS ?Performed at [facility], [place] [address] [place],  [address] ?  ? REPTSTATUS 08/03/2021  FINAL 08/03/2021 1512  ? REPTSTATUS 08/10/2021 FINAL 08/03/2021 1512  ? ? ?Cardiac Enzymes: ?No results for input(s): CKTOTAL, CKMB, CKMBINDEX, TROPONINI in the last 168 hours. ?CBG: ?No results for input(s): GLUCAP in the last 168 hours. ?Iron Studies: No results for input(s): IRON, TIBC, TRANSFERRIN, FERRITIN in the last 72 hours. ?'@lablastinr3' @ ?Studies/Results: ?DG CHEST PORT 1 VIEW ? ?Result Date: 01/27/2022 ?CLINICAL DATA:  Central line placement EXAM: PORTABLE CHEST 1 VIEW COMPARISON:  01/26/2022 FINDINGS: Endotracheal tube with tip measuring 5.8 cm above the carina. Enteric tube tip is off the field of view. A left central venous catheter has been placed with tip projecting horizontally in the mediastinum with consistent with location at the confluence of the superior vena cava and brachiocephalic veins. No pneumothorax. Shallow inspiration. Heart size is normal for technique. Patchy right perihilar and left basilar infiltrates. Probable small left pleural effusion. Coronary artery stents are suggested. IMPRESSION: 1. Left central venous catheter tip projects over the expected confluence of the superior vena cava and brachiocephalic veins. No pneumothorax. 2. Patchy infiltrates in the lungs  with small left pleural effusion. Electronically Signed   By: Lucienne Capers M.D.   On: 01/27/2022 00:36  ? ?DG CHEST PORT 1 VIEW ? ?Result Date: 01/26/2022 ?CLINICAL DATA:  Intubation. EXAM: PORTABLE CHEST 1 VIEW COMPARISON:  Chest radiograph dated 01/26/2022. FINDINGS: The tip of an endotracheal tube is partially seen approximately 7 cm above the carina. An enteric tube with side-port in the distal esophagus and tip in the proximal stomach. Recommend further advancing by additional 8 cm. Cardiomegaly with vascular congestion and edema. Small left pleural effusion and left lung base atelectasis or infiltrate. No pneumothorax. No acute osseous pathology. IMPRESSION: 1. Endotracheal tube above the carina. 2. Enteric tube  with side-port in the distal esophagus and tip in the proximal stomach. Recommend further advancing by additional 8 cm. 3. Cardiomegaly with vascular congestion and edema. 4. Small left pleural effusion and left lung base atelectasis or infiltrate. Electronically Signed   By: Anner Crete M.D.   On: 01/26/2022 23:30  ? ?DG CHEST PORT 1 VIEW ? ?Result Date: 01/26/2022 ?CLINICAL DATA:  A male age 81 presents for evaluation of respiratory difficulty. EXAM: PORTABLE CHEST 1 VIEW COMPARISON:  January 25, 2022. FINDINGS: Diminished lung volumes compared to the recent comparison evaluation. Graded opacity now in the RIGHT chest and increasing opacity in the LEFT lung base compared to the recent comparison study. Cardiomediastinal contours suggest cardiomegaly unchanged. Signs of pulmonary vascular engorgement. On limited assessment there is no acute skeletal process. IMPRESSION: 1. Bilateral pleural effusions, RIGHT greater than LEFT. 2. Stable cardiomegaly with pulmonary vascular engorgement. 3. Increasing opacity at the lung bases may reflect worsening volume loss in the setting of diminished lung volumes. Attention on follow-up Electronically Signed   By: Zetta Bills M.D.   On: 01/26/2022 14:06  ? ?DG Abd Portable 1V ? ?Result Date: 01/27/2022 ?CLINICAL DATA:  Orogastric tube placement EXAM: PORTABLE ABDOMEN - 1 VIEW COMPARISON:  Abdominal x-ray 09/10/2018 FINDINGS: Enteric tube tip is in the upper abdomen to the right of midline, region of the distal stomach/proximal duodenum. No significantly distended loops of bowel identified. Opacities in the lower lung zones left greater than right. IMPRESSION: Enteric tube tip in the region of the distal stomach/proximal duodenum. Electronically Signed   By: Ofilia Neas M.D.   On: 01/27/2022 08:28  ? ?ABORTED INVASIVE LAB PROCEDURE ? ?Result Date: 01/19/2022 ?This case was aborted.  ?Medications: ? sodium chloride 75 mL/hr at 01/27/22 0800  ? sodium chloride Stopped  (01/26/22 1720)  ? sodium chloride    ? fentaNYL infusion INTRAVENOUS 25 mcg/hr (01/27/22 0800)  ? norepinephrine (LEVOPHED) Adult infusion 3 mcg/min (01/27/22 0800)  ? ? sodium chloride   Intravenous Once  ? sodium chloride   Intravenous Once  ? sodium chloride   Intravenous Once  ? atropine      ? [START ON 01/28/2022] calcitRIOL  0.5 mcg Per Tube Daily  ? chlorhexidine gluconate (MEDLINE KIT)  15 mL Mouth Rinse BID  ? Chlorhexidine Gluconate Cloth  6 each Topical Daily  ? fentaNYL (SUBLIMAZE) injection  25 mcg Intravenous Once  ? mouth rinse  15 mL Mouth Rinse 10 times per day  ? niacin  500 mg Oral QHS  ? [START ON 01/28/2022] pantoprazole sodium  40 mg Per Tube Daily  ? sodium chloride flush  10-40 mL Intracatheter Q12H  ? ?Assessment/Plan ?**severe AKI on CKD 3:  baseline 1.2-1.19m/dL, solitary kidney s/p remote nephrectomy.  Now with ~anuric AKI following traumatic hemorrhage into R kidney cyst  resulting in shock then further complicated by cardiac arrest last night.  At this time labs are reasonable and volume is ok but given anuric status I suspect RRT will become necessary in the next 24h.  Have d/w family and they are agreeable.  Appreciate trauma surgery placing HD catheter.  Will plan CRRT given instability.  ?  ?**Hyperkalemia:  resolved with lokelma.  Follow.  ?  ?**ABLA:  RP hemorrhage from renal cyst s/p fall while on apixiban.  Has required transfusions. H/H drifted down but was stable from 1:30am to 5am today.  Serial H/H per primary.  Urology and IR aware but no interventions have been needed. ?  ?**A fib:  off anticoagulation with bleed.  Cardiology is following closely particularly after rhythm issues overnight.  ?  ?Will follow, call with questions/concerns.  ? ?Jannifer Hick MD ?01/27/2022, 10:58 AM  ?[facility] ?Pager: [phone] ? ? ?

## 2022-01-27 NOTE — Progress Notes (Signed)
Dr. Grandville Silos notified with ABG results. ?

## 2022-01-27 NOTE — Progress Notes (Signed)
Brief Nutrition Note ? ?Consult received for enteral/tube feeding initiation and management. ? ?Adult Enteral Nutrition Protocol initiated. Full assessment to follow. ? ?Admitting Dx: Trauma [T14.90XA] ?Renal hemorrhage, left [N28.89] ? ?Body mass index is 24.41 kg/m?.  ?Labs: ? ?Recent Labs  ?Lab 01/15/2022 ?0920 02/04/2022 ?7867 01/26/22 ?1332 01/26/22 ?2358 01/27/22 ?0127 01/27/22 ?5449  ?NA 139   < > 137 133* 134* 133*  ?K 3.7   < > 5.1 4.6 5.1 5.1  ?CL 102   < > 105 101  --  101  ?CO2 24   < > 21* 17*  --  19*  ?BUN 21   < > 39* 43*  --  46*  ?CREATININE 1.46*   < > 2.96* 3.33*  --  3.24*  ?CALCIUM 8.6*   < > 7.8* 7.4*  --  7.1*  ?MG 1.8  --   --   --   --   --   ?GLUCOSE 200*   < > 163* 180*  --  107*  ? < > = values in this interval not displayed.  ? ?Lockie Pares., RD, LDN, CNSC ?See AMiON for contact information  ? ? ?

## 2022-01-27 NOTE — Progress Notes (Signed)
?   01/27/22 1330  ?Clinical Encounter Type  ?Visited With Patient not available  ?Visit Type Follow-up  ?Referral From Chaplain  ?Consult/Referral To Chaplain  ? ?Chaplain attempted to visit with patient and pass on information, however Anush was receiving care at the time.  A request for a [employer] priest was made by the family.Father Barnabas Lister was contacted and will be in sometime after 1600. ? ?Danice Goltz ?Chaplain Resident  ?[facility]  ?[phone] ? ? ?

## 2022-01-27 NOTE — Procedures (Signed)
Central Venous Catheter Insertion Procedure Note ? [name] [name]  ?[id number]  ?05-May-1941 ? ?Date:01/27/22  ?Time:3:12 PM  ? ?Provider [place]  ? ?Procedure: Insertion of Non-tunneled Central Venous Catheter(36556) with US guidance (38381)  ? ?Indication(s) ?Hemodialysis ? ?Consent ?Risks of the procedure as well as the alternatives and risks of each were explained to the patient and/or caregiver.  Consent for the procedure was obtained and is signed in the bedside chart ? ?Anesthesia ?Topical only with 1% lidocaine   ?IV Fentanyl  ? ?Timeout ?Verified patient identification, verified procedure, site/side was marked, verified correct patient position, special equipment/implants available, medications/allergies/relevant history reviewed, required imaging and test results available. ? ?Sterile Technique ?Maximal sterile technique including full sterile barrier drape, hand hygiene, sterile gown, sterile gloves, mask, hair covering, sterile ultrasound probe cover (if used). ? ?Procedure Description ?Area of catheter insertion was cleaned with iodine and chlorhexidine and draped in sterile fashion.  With real-time ultrasound guidance a HD catheter was placed into the right internal jugular vein. Nonpulsatile blood flow and easy flushing noted in all ports.  The catheter was sutured in place and sterile dressing applied. ? ?Complications/Tolerance ?None; patient tolerated the procedure well. ?Chest X-ray is ordered to verify placement.  ? ?EBL ?Minimal ? ?Specimen(s) ?None ? ? ?[name], PA-C ?[facility] ?Please see Amion for pager number during day hours 7:00am-4:30pm ? ? ? ? ? ? ?

## 2022-01-27 NOTE — Progress Notes (Addendum)
Trauma Event Note ? ? ? ?Reason for Call : ? ?Code Blue ? ?Initial Focused Assessment: ? ?CPR in progress upon arrival to bedside, code team had yet to arrive (running another code) at the time of my arrival.  ? ?Interventions: ? ?Intubation by code team anesthesiology ?Central line by trauma MD ?ACLS per code sheet ?OG by myself ?Levophed for hypotension ?Fentanyl infusion for sedation ?Portable XRAY for line/tube placement ?EKG ?CBC/CMP ?IV start by myself  ?IO by IV team ?Family presence, wife and daughter arrived at bedside at the time of this note ? ?Plan of Care: ? ?FULL CODE ?Levophed for blood pressure management, fentanyl infusion/bolus for sedation ?Initiate dopamine per cardiology note for bradycardia, call cards for TVP ? ?Event Summary: ? ?Per Roderic Palau primary RN, patient had self resolving bradycardic episode and intermittent unresponsive episodes x1 hour. Had another bout of bradycardia and was given atropine, then lost pulses, asystole at 2250. Upon my arrival to room, patient in asystole, ACLS protocol in progress. Received a total of 3 doses of epi and 1 dose of atropine, see code sheet for times. ROSC SVT at 2303. Assisted with IV start, OG, CVC placement. Dr. Grandville Silos updated family via telephone, then at bedside.   ? ?Last imported Vital Signs ?BP (!) 120/105   Pulse (!) 135   Temp (!) 96.3 ?F (35.7 ?C) (Axillary)   Resp 20   Ht '5\' 11"'$  (1.803 m)   Wt 175 lb (79.4 kg)   SpO2 100%   BMI 24.41 kg/m?  ? ?Trending CBC ?Recent Labs  ?  01/26/22 ?0722 01/26/22 ?1332 01/26/22 ?2013 01/26/22 ?2358  ?WBC 17.6* 19.0*  --  19.4*  ?HGB 7.6* 6.8* 8.0* 7.4*  ?HCT 24.1* 21.1* 24.8* 22.6*  ?PLT 192 199  --  198  ? ? ?Trending Coag's ?Recent Labs  ?  01/13/2022 ?0920  ?INR 1.6*  ? ? ?Trending BMET ?Recent Labs  ?  02/05/2022 ?2031 01/26/22 ?0140 01/26/22 ?1332  ?NA 139 137 137  ?K 4.5 6.1* 5.1  ?CL 104 106 105  ?CO2 22 20* 21*  ?BUN 25* 31* 39*  ?CREATININE 1.81* 2.27* 2.96*  ?GLUCOSE 144* 127* 163*   ? ? ? ? ?[place]  ?Trauma Response RN ? ?Please call TRN at [phone] for further assistance. ? ? ?  ?

## 2022-01-27 NOTE — Progress Notes (Signed)
Trauma Event Note ? ? ? ?TRN rounded on patient. Patient intubated/sedated, no family at bedside at the time of rounds. Palliative consult today, code status changed to DNR. Required one unit PRBCs this morning for anemia. No response to painful stimuli on my assessment. Checked in with primary nurse, no needs at this time.  ? ?Last imported Vital Signs ?BP (!) 110/58   Pulse (!) 101   Temp 99.6 ?F (37.6 ?C) (Axillary)   Resp 20   Ht '5\' 11"'$  (1.803 m)   Wt 175 lb (79.4 kg)   SpO2 100%   BMI 24.41 kg/m?  ? ?Trending CBC ?Recent Labs  ?  01/26/22 ?2358 01/27/22 ?0127 01/27/22 ?8469 01/27/22 ?1145  ?WBC 19.4*  --  16.2* 16.1*  ?HGB 7.4* 6.8* 6.8* 8.0*  ?HCT 22.6* 20.0* 20.6* 24.1*  ?PLT 198  --  185 166  ? ? ?Trending Coag's ?Recent Labs  ?  02/03/2022 ?0920  ?INR 1.6*  ? ? ?Trending BMET ?Recent Labs  ?  01/26/22 ?2358 01/27/22 ?0127 01/27/22 ?6295 01/27/22 ?1600  ?NA 133* 134* 133* 130*  ?K 4.6 5.1 5.1 5.7*  ?CL 101  --  101 103  ?CO2 17*  --  19* 18*  ?BUN 43*  --  46* 55*  ?CREATININE 3.33*  --  3.24* 3.93*  ?GLUCOSE 180*  --  107* 137*  ? ? ? ? ?[place]  ?Trauma Response RN ? ?Please call TRN at [phone] for further assistance. ? ? ?  ?

## 2022-01-27 NOTE — Consult Note (Addendum)
? ?                                                                                ?Consultation Note ?Date: 01/27/2022  ? ?Patient Name: [name] [name]  ?DOB: 1941-08-12  MRN: [id number]  Age / Sex: 81 y.o., male  ?PCP: Lindell Spar, MD ?Referring Physician: Md, Trauma, MD ? ?Reason for Consultation:  goals of care ? ?HPI/Patient Profile: 81 y.o. male  with past medical history of CVA, MI with stents placed, A-fib on Eliquis, CAD, urothelial cancer status post right nephroureterectomy, chronic left pleural effusion admitted on 01/28/2022 with a fall at home.  He landed on his left flank which resulted in a renal hemorrhage from a pre-existing cyst.  He had ST elevation on EKG this is thought to be from demand ischemia from his acute blood loss.  Last night he had severe bradycardia and went into cardiac arrest he required CPR for almost 5 minutes he received epinephrine and atropine , required intubation, he has had decreased urine output and acute urine injury he is now on CRRT.  He was started on pressor support for hypotension.  Per cardiology he likely has sick sinus syndrome.  Palliative consulted for goals of care. ? ?Primary Decision Maker ? ?NEXT OF KIN-his spouse [name] Lien ?Discussion: ?I have reviewed medical records including EPIC notes, labs and imaging, received report from Dr. Bobbye Morton, assessed the patient and then met with the patient's spouse-Jackie, daughter-April, and granddaughter-Crystal to discuss diagnosis prognosis, GOC, EOL wishes, disposition and options. ? ?I introduced Palliative Medicine as specialized medical care for people living with serious illness. It focuses on providing relief from the symptoms and stress of a serious illness. The goal is to improve quality of life for both the patient and the family. ? ?We discussed a brief life review of the patient.  He is a loving grandfather husband and dad.  They note he does tend to be a bit pessimistic.  He values his physical health, he  has recently been going to the gym 3 days a week and has changed his eating habits, his spouse notes that he has lost a good amount of weight and this has been very important to him. ? ?We discussed patient's current illness and what it means in the larger context of patient's on-going co-morbidities.  Natural disease trajectory and expectations at EOL were discussed. ? ?[place] does not have advanced directives but [name] said they did have a conversation and completed advanced directives just did not have it notarized.  She said that he would not want to be artificially prolonged on life support or have artificial nutrition or hydration at end-of-life if it was determined that he was not going to be liberated from the artificial care. ? ?I attempted to elicit values and goals of care important to the patient.  Encouraged [name] to consider with patient's bottom line quality of life would be what is important to him.  ? ?We discussed best case scenario would include [place] improving well and rehabilitation. We discussed the worst case which is little improvement and continued complications. I shared that often patients end up somewhere in the middle requiring a  new level of care often for the rest of their life.  ? ?The difference between aggressive medical intervention and comfort care was discussed in light of the patient's goals of care.  ? ?Discussed with patient/family the importance of continued conversation with family and the medical providers regarding overall plan of care and treatment options, ensuring decisions are within the context of the patient?s values and GOCs.   ? ?Questions and concerns were addressed. The family was encouraged to call with questions or concerns.  ? ? ?SUMMARY OF RECOMMENDATIONS ?-Hemorrhagic shock secondary to left renal hemorrhage, s/p cardiac arrest with CPR- GOC and treatment decisions are to continue current interventions over next 24-48 hours to help determine outcomes- no  trach, no PEG, DNR ?   ? ?Code Status/Advance Care Planning: ?DNR ? ? ?Prognosis:   ?Unable to determine ? ?Discharge Planning: To Be Determined ? ?Primary Diagnoses: ?Present on Admission: ? Fall ? Renal hemorrhage, left ? ? ?Review of Systems  ?Unable to perform ROS: Acuity of condition  ? ?Physical Exam ?Vitals and nursing note reviewed.  ?Constitutional:   ?   Appearance: He is ill-appearing.  ?Pulmonary:  ?   Comments: intubated ? ? ?Vital Signs: BP (!) 141/81   Pulse 93   Temp 100.2 ?F (37.9 ?C) (Axillary)   Resp 20   Ht '5\' 11"'  (1.803 m)   Wt 79.4 kg   SpO2 100%   BMI 24.41 kg/m?  ?Pain Scale: CPOT ?POSS *See Group Information*: 1-Acceptable,Awake and alert ?Pain Score: 5  ? ? ?SpO2: SpO2: 100 % ?O2 Device:SpO2: 100 % ?O2 Flow Rate: .O2 Flow Rate (L/min): 3 L/min ? ?IO: Intake/output summary:  ?Intake/Output Summary (Last 24 hours) at 01/27/2022 1601 ?Last data filed at 01/27/2022 1500 ?Gross per 24 hour  ?Intake 3089.2 ml  ?Output 11 ml  ?Net 3078.2 ml  ? ? ?LBM: Last BM Date : 01/24/22 ?Baseline Weight: Weight: 79.4 kg ?Most recent weight: Weight: 79.4 kg     ?Palliative Assessment/Data: PPS:10% ? ? ? ? ? ? ?Thank you for this consult. Palliative medicine will continue to follow and assist as needed.  ? ? ?Signed by: ?Mariana Kaufman, AGNP-C ?Palliative Medicine ? ?  ?Please contact Palliative Medicine Team phone at [phone] for questions and concerns.  ?For individual provider: See Amion ? ? ? ? ? ? ? ? ? ? ? ? ? ? ?

## 2022-01-27 NOTE — Progress Notes (Signed)
This RN was notified by lab at 0539 of critical Hgb of 6.8. Dr. Grandville Silos notified. New orders to transfuse 1 unit of packed red blood cells. Will continue to monitor.  ?

## 2022-01-27 NOTE — Progress Notes (Signed)
?  01/27/22 0015  ?Clinical Encounter Type  ?Visited With Patient and family together  ?Visit Type Code  ?Referral From Nurse  ?Consult/Referral To Chaplain  ? ?Chaplain Jorene Guest responded and met the family when they arrived ( patient's wife, [name] [name] and their daughter, son-in-law and granddaughter ). [name] [name] requested a visit from a [employer], advised will contact Father Barnabas Lister to visit in A.M. This chaplain informed the attending nurse, Roderic Palau of the family's request. This note was prepared by Jeanine Luz, M.Div..  For questions please contact by phone [phone].   ?

## 2022-01-28 ENCOUNTER — Inpatient Hospital Stay (HOSPITAL_COMMUNITY): Payer: Medicare HMO

## 2022-01-28 DIAGNOSIS — I4821 Permanent atrial fibrillation: Secondary | ICD-10-CM

## 2022-01-28 DIAGNOSIS — I469 Cardiac arrest, cause unspecified: Secondary | ICD-10-CM | POA: Diagnosis not present

## 2022-01-28 DIAGNOSIS — N2889 Other specified disorders of kidney and ureter: Secondary | ICD-10-CM | POA: Diagnosis not present

## 2022-01-28 DIAGNOSIS — Z7189 Other specified counseling: Secondary | ICD-10-CM | POA: Diagnosis not present

## 2022-01-28 LAB — RENAL FUNCTION PANEL
Albumin: 2.1 g/dL — ABNORMAL LOW (ref 3.5–5.0)
Albumin: 2.2 g/dL — ABNORMAL LOW (ref 3.5–5.0)
Anion gap: 11 (ref 5–15)
Anion gap: 12 (ref 5–15)
BUN: 66 mg/dL — ABNORMAL HIGH (ref 8–23)
BUN: 55 mg/dL — ABNORMAL HIGH (ref 8–23)
CO2: 18 mmol/L — ABNORMAL LOW (ref 22–32)
CO2: 20 mmol/L — ABNORMAL LOW (ref 22–32)
Calcium: 7 mg/dL — ABNORMAL LOW (ref 8.9–10.3)
Calcium: 6.9 mg/dL — ABNORMAL LOW (ref 8.9–10.3)
Chloride: 102 mmol/L (ref 98–111)
Chloride: 102 mmol/L (ref 98–111)
Creatinine, Ser: 4.38 mg/dL — ABNORMAL HIGH (ref 0.61–1.24)
Creatinine, Ser: 3.67 mg/dL — ABNORMAL HIGH (ref 0.61–1.24)
GFR, Estimated: 13 mL/min — ABNORMAL LOW (ref >60)
GFR, Estimated: 16 mL/min — ABNORMAL LOW (ref >60)
Glucose, Bld: 164 mg/dL — ABNORMAL HIGH (ref 70–99)
Glucose, Bld: 146 mg/dL — ABNORMAL HIGH (ref 70–99)
Phosphorus: 5.1 mg/dL — ABNORMAL HIGH (ref 2.5–4.6)
Phosphorus: 4.8 mg/dL — ABNORMAL HIGH (ref 2.5–4.6)
Potassium: 5.3 mmol/L — ABNORMAL HIGH (ref 3.5–5.1)
Potassium: 5.2 mmol/L — ABNORMAL HIGH (ref 3.5–5.1)
Sodium: 131 mmol/L — ABNORMAL LOW (ref 135–145)
Sodium: 134 mmol/L — ABNORMAL LOW (ref 135–145)

## 2022-01-28 LAB — MAGNESIUM
Magnesium: 1.7 mg/dL (ref 1.7–2.4)
Magnesium: 1.8 mg/dL (ref 1.7–2.4)

## 2022-01-28 LAB — PHOSPHORUS
Phosphorus: 5 mg/dL — ABNORMAL HIGH (ref 2.5–4.6)
Phosphorus: 4.7 mg/dL — ABNORMAL HIGH (ref 2.5–4.6)

## 2022-01-28 LAB — CBC
HCT: 20.8 % — ABNORMAL LOW (ref 39.0–52.0)
HCT: 25 % — ABNORMAL LOW (ref 39.0–52.0)
Hemoglobin: 6.9 g/dL — CL (ref 13.0–17.0)
Hemoglobin: 8.2 g/dL — ABNORMAL LOW (ref 13.0–17.0)
MCH: 30 pg (ref 26.0–34.0)
MCH: 30.6 pg (ref 26.0–34.0)
MCHC: 33.2 g/dL (ref 30.0–36.0)
MCHC: 32.8 g/dL (ref 30.0–36.0)
MCV: 90.4 fL (ref 80.0–100.0)
MCV: 93.3 fL (ref 80.0–100.0)
Platelets: 160 10*3/uL (ref 150–400)
Platelets: 155 10*3/uL (ref 150–400)
RBC: 2.3 MIL/uL — ABNORMAL LOW (ref 4.22–5.81)
RBC: 2.68 MIL/uL — ABNORMAL LOW (ref 4.22–5.81)
RDW: 15.5 % (ref 11.5–15.5)
RDW: 15.6 % — ABNORMAL HIGH (ref 11.5–15.5)
WBC: 13 10*3/uL — ABNORMAL HIGH (ref 4.0–10.5)
WBC: 15.4 10*3/uL — ABNORMAL HIGH (ref 4.0–10.5)
nRBC: 0 % (ref 0.0–0.2)
nRBC: 0 % (ref 0.0–0.2)

## 2022-01-28 LAB — GLUCOSE, CAPILLARY
Glucose-Capillary: 145 mg/dL — ABNORMAL HIGH (ref 70–99)
Glucose-Capillary: 139 mg/dL — ABNORMAL HIGH (ref 70–99)
Glucose-Capillary: 88 mg/dL (ref 70–99)
Glucose-Capillary: 125 mg/dL — ABNORMAL HIGH (ref 70–99)
Glucose-Capillary: 140 mg/dL — ABNORMAL HIGH (ref 70–99)
Glucose-Capillary: 118 mg/dL — ABNORMAL HIGH (ref 70–99)

## 2022-01-28 LAB — MRSA NEXT GEN BY PCR, NASAL: MRSA by PCR Next Gen: NOT DETECTED

## 2022-01-28 LAB — PREPARE RBC (CROSSMATCH)

## 2022-01-28 LAB — TRIGLYCERIDES: Triglycerides: 79 mg/dL (ref <150)

## 2022-01-28 MED ORDER — DOPAMINE-DEXTROSE 3.2-5 MG/ML-% IV SOLN
0.0000 ug/kg/min | INTRAVENOUS | Status: DC
  Administered 2022-01-28: 5 ug/kg/min via INTRAVENOUS
  Administered 2022-01-29: 20 ug/kg/min via INTRAVENOUS
  Administered 2022-01-29: 15 ug/kg/min via INTRAVENOUS
  Filled 2022-01-28 (×3): qty 250

## 2022-01-28 MED ORDER — PROSOURCE TF PO LIQD
90.0000 mL | Freq: Four times a day (QID) | ORAL | Status: DC
  Administered 2022-01-28 – 2022-01-29 (×5): 90 mL
  Filled 2022-01-28 (×4): qty 90

## 2022-01-28 MED ORDER — SODIUM CHLORIDE 0.9 % IV SOLN: INTRAVENOUS | Status: DC | PRN

## 2022-01-28 MED ORDER — MIDAZOLAM HCL 2 MG/2ML IJ SOLN
2.0000 mg | INTRAMUSCULAR | Status: DC | PRN
  Administered 2022-01-28 – 2022-01-29 (×5): 2 mg via INTRAVENOUS
  Filled 2022-01-28 (×5): qty 2

## 2022-01-28 MED ORDER — NOREPINEPHRINE 16 MG/250ML-% IV SOLN
0.0000 ug/min | INTRAVENOUS | Status: DC
  Administered 2022-01-28: 24 ug/min via INTRAVENOUS
  Administered 2022-01-29 (×2): 2 ug/min via INTRAVENOUS
  Filled 2022-01-28 (×2): qty 250

## 2022-01-28 MED ORDER — SODIUM CHLORIDE 0.9% IV SOLUTION
Freq: Once | INTRAVENOUS | Status: DC
Start: 2022-01-28 — End: 2022-01-29

## 2022-01-28 MED ORDER — VITAL 1.5 CAL PO LIQD
1000.0000 mL | ORAL | Status: DC
  Administered 2022-01-28 – 2022-01-29 (×2): 1000 mL

## 2022-01-28 MED ORDER — MIDAZOLAM HCL 2 MG/2ML IJ SOLN
INTRAMUSCULAR | Status: AC
  Administered 2022-01-28: 2 mg via INTRAVENOUS
  Filled 2022-01-28: qty 2

## 2022-01-28 NOTE — Progress Notes (Signed)
Patient ID: [name] [name], male   DOB: 1941/10/26, 81 y.o.   MRN: [id number] ? ?3 Days Post-Op ?Subjective: ?Events of last 24 hrs noted.  Pt now intubated on CRT.  Has remained anuric.  Palliative care now involved. ? ?Objective: ?Vital signs in last 24 hours: ?Temp:  [98.4 ?F (36.9 ?C)-100.5 ?F (38.1 ?C)] 99.8 ?F (37.7 ?C) (03/16 0400) ?Pulse Rate:  [72-103] 78 (03/16 0600) ?Resp:  [0-29] 18 (03/16 0600) ?BP: (90-141)/(51-90) 102/58 (03/16 0600) ?SpO2:  [92 %-100 %] 100 % (03/16 0600) ?Arterial Line BP: (88-159)/(48-74) 123/59 (03/16 0600) ?FiO2 (%):  [50 %-60 %] 50 % (03/16 0320) ? ?Intake/Output from previous day: ?03/15 0701 - 03/16 0700 ?In: 3253.8 [I.V.:2472.1; Blood:315; NG/GT:466.7] ?Out: 20 [Urine:20] ?Intake/Output this shift: ?Total I/O ?In: 1850.2 [I.V.:1410.2; NG/GT:440] ?Out: 10 [Urine:10] ? ?Physical Exam:  ?General: Pt intubated/sedated ? ?Lab Results: ?Recent Labs  ?  01/27/22 ?3151 01/27/22 ?1145 01/28/22 ?7616  ?HGB 6.8* 8.0* 6.9*  ?HCT 20.6* 24.1* 20.8*  ? ?BMET ?Recent Labs  ?  01/27/22 ?0457 01/27/22 ?1600  ?NA 133* 130*  ?K 5.1 5.7*  ?CL 101 103  ?CO2 19* 18*  ?GLUCOSE 107* 137*  ?BUN 46* 55*  ?CREATININE 3.24* 3.93*  ?CALCIUM 7.1* 7.2*  ? ? ? ? ? ? ?Assessment/Plan: ?1) Left retroperitoneal hematoma possibly from renal source due to traumatic fall:  Pt's overall prognosis is poor.  Palliative care involved with plan to reassess his chance for recovery over the next day or so.  Hgb improved after transfusion yesterday but back to 6.9 this morning.  As long as life saving measures are still being considered, would continue transfusion as necessary but would not recommend any intervention at this time.  Will continue to follow until decision is otherwise made regarding goals of care. ? ? LOS: 3 days  ? ?[name] ?01/28/2022, 6:46 AM ? ?  ?

## 2022-01-28 NOTE — Progress Notes (Signed)
?[facility] ?Progress Note  ? ?Subjective:   Remains intubated in ICU.  H/H drifting down, urology following - no intervention planned at this time beyond supportive transfusion.  On vasopressor support.  Remains anuric.  Net +12L for admission.  CRRT now running.  ? ?Objective ?Vitals:  ? 01/28/22 0910 01/28/22 0915 01/28/22 0920 01/28/22 0925  ?BP:  98/80    ?Pulse: 95 91 96 99  ?Resp: 17 (!) 31 (!) 21 15  ?Temp:      ?TempSrc:      ?SpO2: 100% 98% 97% 100%  ?Weight:      ?Height:      ? ?Physical Exam ?General: elderly man intubated and sedated ?Heart: irreg irreg, no rub ?Lungs: coarse BL, dec BS bases ?Abdomen: soft, mod distended, non tender ?Extremities: 2+ diffuse edema ?GU:  foley draining scant bloody urine, scrotal and penile edema and bruising noted ? ?Additional Objective ?Labs: ?Basic Metabolic Panel: ?Recent Labs  ?Lab 01/27/22 ?0457 01/27/22 ?1600 01/28/22 ?6734  ?NA 133* 130* 131*  ?K 5.1 5.7* 5.3*  ?CL 101 103 102  ?CO2 19* 18* 18*  ?GLUCOSE 107* 137* 164*  ?BUN 46* 55* 66*  ?CREATININE 3.24* 3.93* 4.38*  ?CALCIUM 7.1* 7.2* 7.0*  ?PHOS  --  4.7*  4.8* 5.0*  5.1*  ? ? ?Liver Function Tests: ?Recent Labs  ?Lab 01/30/2022 ?0920 01/26/22 ?2358 01/27/22 ?1600 01/28/22 ?1937  ?AST 23 107*  --   --   ?ALT 13 34  --   --   ?ALKPHOS 69 60  --   --   ?BILITOT 1.0 0.6  --   --   ?PROT 6.3* 5.2*  --   --   ?ALBUMIN 2.9* 2.4* 2.3* 2.1*  ? ? ?No results for input(s): LIPASE, AMYLASE in the last 168 hours. ?CBC: ?Recent Labs  ?Lab 01/26/22 ?1332 01/26/22 ?2013 01/26/22 ?2358 01/27/22 ?0127 01/27/22 ?0457 01/27/22 ?1145 01/28/22 ?9024  ?WBC 19.0*  --  19.4*  --  16.2* 16.1* 13.0*  ?HGB 6.8*   < > 7.4*   < > 6.8* 8.0* 6.9*  ?HCT 21.1*   < > 22.6*   < > 20.6* 24.1* 20.8*  ?MCV 93.4  --  98.3  --  90.7 90.6 90.4  ?PLT 199  --  198  --  185 166 160  ? < > = values in this interval not displayed.  ? ? ?Blood Culture ?   ?Component Value Date/Time  ? SDES FLUID PLEURAL LEFT 08/03/2021 1512  ? SDES FLUID  PLEURAL 08/03/2021 1512  ? SPECREQUEST BOTTLES DRAWN AEROBIC AND ANAEROBIC 08/03/2021 1512  ? SPECREQUEST BOTTLES DRAWN AEROBIC AND ANAEROBIC 08/03/2021 1512  ? CULT  08/03/2021 1512  ?  NO GROWTH 7 DAYS ?Performed at [facility], [place] [address], [place], [place] [address] ?  ? REPTSTATUS 08/03/2021 FINAL 08/03/2021 1512  ? REPTSTATUS 08/10/2021 FINAL 08/03/2021 1512  ? ? ?Cardiac Enzymes: ?No results for input(s): CKTOTAL, CKMB, CKMBINDEX, TROPONINI in the last 168 hours. ?CBG: ?Recent Labs  ?Lab 01/27/22 ?1542 01/27/22 ?1936 01/27/22 ?2328 01/28/22 ?3299 01/28/22 ?2426  ?GLUCAP 78 87 153* 145* 139*  ? ?Iron Studies: No results for input(s): IRON, TIBC, TRANSFERRIN, FERRITIN in the last 72 hours. ?'@lablastinr3'$ @ ?Studies/Results: ?DG CHEST PORT 1 VIEW ? ?Result Date: 01/27/2022 ?CLINICAL DATA:  Adjustment of endotracheal tube EXAM: PORTABLE CHEST 1 VIEW COMPARISON:  01/27/2022 FINDINGS: Endotracheal tube is present with tip measuring 5.4 cm above the carina. Enteric tube is present. Tip is  off the field of view but below the left hemidiaphragm. A mediastinal drain is present. Right central venous catheter with tip over the mid SVC region. Left central venous catheter tip is heart is on lead positioned, projected over the confluence of the SVC and brachiocephalic vein. No pneumothorax. Patchy infiltrates in the right upper and lower lung and in the left lower lung. Small left pleural effusion. Degenerative changes in the spine and shoulders. IMPRESSION: Appliances are unchanged in position. Persistent patchy bilateral pulmonary infiltrates. Small left pleural effusion. Electronically Signed   By: Lucienne Capers M.D.   On: 01/27/2022 20:28  ? ?DG CHEST PORT 1 VIEW ? ?Result Date: 01/27/2022 ?CLINICAL DATA:  Central line placement.  Ventilator support. EXAM: PORTABLE CHEST 1 VIEW COMPARISON:  Earlier same day FINDINGS: New right internal jugular central line tip in the SVC at the level of the innominate vein  entrance. Left-sided central line tip as its tip at the innominate SVC junction. Endotracheal tube tip 5 cm above the carina. Orogastric or nasogastric tube enters the abdomen. Persistent infiltrate/volume loss in the lower lobes, left worse than right. New patchy density in the right upper lobe. IMPRESSION: New right internal jugular central line tip in the SVC at the level of the innominate vein confluence. No pneumothorax. Persistent lower lobe infiltrate/atelectasis, left worse than right. New patchy density in the right upper lobe. Electronically Signed   By: Nelson Chimes M.D.   On: 01/27/2022 15:52  ? ?DG CHEST PORT 1 VIEW ? ?Result Date: 01/27/2022 ?CLINICAL DATA:  Central line placement EXAM: PORTABLE CHEST 1 VIEW COMPARISON:  01/26/2022 FINDINGS: Endotracheal tube with tip measuring 5.8 cm above the carina. Enteric tube tip is off the field of view. A left central venous catheter has been placed with tip projecting horizontally in the mediastinum with consistent with location at the confluence of the superior vena cava and brachiocephalic veins. No pneumothorax. Shallow inspiration. Heart size is normal for technique. Patchy right perihilar and left basilar infiltrates. Probable small left pleural effusion. Coronary artery stents are suggested. IMPRESSION: 1. Left central venous catheter tip projects over the expected confluence of the superior vena cava and brachiocephalic veins. No pneumothorax. 2. Patchy infiltrates in the lungs with small left pleural effusion. Electronically Signed   By: Lucienne Capers M.D.   On: 01/27/2022 00:36  ? ?DG CHEST PORT 1 VIEW ? ?Result Date: 01/26/2022 ?CLINICAL DATA:  Intubation. EXAM: PORTABLE CHEST 1 VIEW COMPARISON:  Chest radiograph dated 01/26/2022. FINDINGS: The tip of an endotracheal tube is partially seen approximately 7 cm above the carina. An enteric tube with side-port in the distal esophagus and tip in the proximal stomach. Recommend further advancing by  additional 8 cm. Cardiomegaly with vascular congestion and edema. Small left pleural effusion and left lung base atelectasis or infiltrate. No pneumothorax. No acute osseous pathology. IMPRESSION: 1. Endotracheal tube above the carina. 2. Enteric tube with side-port in the distal esophagus and tip in the proximal stomach. Recommend further advancing by additional 8 cm. 3. Cardiomegaly with vascular congestion and edema. 4. Small left pleural effusion and left lung base atelectasis or infiltrate. Electronically Signed   By: Anner Crete M.D.   On: 01/26/2022 23:30  ? ?DG CHEST PORT 1 VIEW ? ?Result Date: 01/26/2022 ?CLINICAL DATA:  A male age 1 presents for evaluation of respiratory difficulty. EXAM: PORTABLE CHEST 1 VIEW COMPARISON:  January 25, 2022. FINDINGS: Diminished lung volumes compared to the recent comparison evaluation. Graded opacity now in the RIGHT  chest and increasing opacity in the LEFT lung base compared to the recent comparison study. Cardiomediastinal contours suggest cardiomegaly unchanged. Signs of pulmonary vascular engorgement. On limited assessment there is no acute skeletal process. IMPRESSION: 1. Bilateral pleural effusions, RIGHT greater than LEFT. 2. Stable cardiomegaly with pulmonary vascular engorgement. 3. Increasing opacity at the lung bases may reflect worsening volume loss in the setting of diminished lung volumes. Attention on follow-up Electronically Signed   By: Zetta Bills M.D.   On: 01/26/2022 14:06  ? ?DG Abd Portable 1V ? ?Result Date: 01/27/2022 ?CLINICAL DATA:  Orogastric tube placement EXAM: PORTABLE ABDOMEN - 1 VIEW COMPARISON:  Abdominal x-ray 09/10/2018 FINDINGS: Enteric tube tip is in the upper abdomen to the right of midline, region of the distal stomach/proximal duodenum. No significantly distended loops of bowel identified. Opacities in the lower lung zones left greater than right. IMPRESSION: Enteric tube tip in the region of the distal stomach/proximal  duodenum. Electronically Signed   By: Ofilia Neas M.D.   On: 01/27/2022 08:28   ?Medications: ?  prismasol BGK 4/2.5    ?  prismasol BGK 4/2.5    ? sodium chloride 75 mL/hr at 01/28/22 0900  ? sodium chloride Sto

## 2022-01-28 NOTE — Progress Notes (Incomplete)
1510: Patient with hand on tube attempting to pull out. Left hand restraint placed. MD Lovick aware  ?

## 2022-01-28 NOTE — Progress Notes (Signed)
Hemoglobin 6.9. Notified trauma. Dr. Zenia Resides said she will hold off on ordering blood and let trauma decide when they round this AM.  ?

## 2022-01-28 NOTE — Plan of Care (Signed)
?  Problem: Clinical Measurements: ?Goal: Respiratory complications will improve ?Outcome: Progressing ?  ?Problem: Nutrition: ?Goal: Adequate nutrition will be maintained ?Outcome: Progressing ?  ?Problem: Safety: ?Goal: Ability to remain free from injury will improve ?Outcome: Progressing ?  ?Problem: Safety: ?Goal: Non-violent Restraint(s) ?Outcome: Progressing ?  ?

## 2022-01-28 NOTE — Progress Notes (Signed)
? ?Trauma/Critical Care Follow Up Note ? ?Subjective:  ?  ?Overnight Issues:  ? ?Objective:  ?Vital signs for last 24 hours: ?Temp:  [98.4 ?F (36.9 ?C)-100.2 ?F (37.9 ?C)] 99 ?F (37.2 ?C) (03/16 0800) ?Pulse Rate:  [72-103] 97 (03/16 1000) ?Resp:  [0-31] 23 (03/16 1017) ?BP: (90-141)/(48-108) 111/48 (03/16 1000) ?SpO2:  [91 %-100 %] 99 % (03/16 1017) ?Arterial Line BP: (83-159)/(46-74) 83/47 (03/16 1000) ?FiO2 (%):  [50 %-100 %] 100 % (03/16 1017) ?Weight:  [94.9 kg] 94.9 kg (03/16 0800) ? ?Hemodynamic parameters for last 24 hours: ?  ? ?Intake/Output from previous day: ?03/15 0701 - 03/16 0700 ?In: 3435.4 [I.V.:2613.8; Blood:315; NG/GT:506.7] ?Out: 20 [Urine:20]  ?Intake/Output this shift: ?Total I/O ?In: 445 [I.V.:445] ?Out: 3 [Other:3] ? ?Vent settings for last 24 hours: ?Vent Mode: PRVC ?FiO2 (%):  [50 %-100 %] 100 % ?Set Rate:  [20 bmp-201 bmp] 20 bmp ?Vt Set:  [620 mL] 620 mL ?PEEP:  [5 cmH20] 5 cmH20 ?Plateau Pressure:  [15 cmH20-30 cmH20] 19 cmH20 ? ?Physical Exam:  ?Gen: comfortable, no distress ?Neuro: not f/c ?HEENT: PERRL ?Neck: supple ?CV: hypotensive on levophed ?Pulm: mildly labored breathing ?Abd: soft, NT ?GU: anuric ?Extr: wwp, 2+ edema ? ? ?Results for orders placed or performed during the hospital encounter of 02/04/2022 (from the past 24 hour(s))  ?CBC     Status: Abnormal  ? Collection Time: 01/27/22 11:45 AM  ?Result Value Ref Range  ? WBC 16.1 (H) 4.0 - 10.5 K/uL  ? RBC 2.66 (L) 4.22 - 5.81 MIL/uL  ? Hemoglobin 8.0 (L) 13.0 - 17.0 g/dL  ? HCT 24.1 (L) 39.0 - 52.0 %  ? MCV 90.6 80.0 - 100.0 fL  ? MCH 30.1 26.0 - 34.0 pg  ? MCHC 33.2 30.0 - 36.0 g/dL  ? RDW 15.2 11.5 - 15.5 %  ? Platelets 166 150 - 400 K/uL  ? nRBC 0.0 0.0 - 0.2 %  ?Glucose, capillary     Status: None  ? Collection Time: 01/27/22  3:42 PM  ?Result Value Ref Range  ? Glucose-Capillary 78 70 - 99 mg/dL  ?Renal function panel (daily at 1600)     Status: Abnormal  ? Collection Time: 01/27/22  4:00 PM  ?Result Value Ref Range  ?  Sodium 130 (L) 135 - 145 mmol/L  ? Potassium 5.7 (H) 3.5 - 5.1 mmol/L  ? Chloride 103 98 - 111 mmol/L  ? CO2 18 (L) 22 - 32 mmol/L  ? Glucose, Bld 137 (H) 70 - 99 mg/dL  ? BUN 55 (H) 8 - 23 mg/dL  ? Creatinine, Ser 3.93 (H) 0.61 - 1.24 mg/dL  ? Calcium 7.2 (L) 8.9 - 10.3 mg/dL  ? Phosphorus 4.8 (H) 2.5 - 4.6 mg/dL  ? Albumin 2.3 (L) 3.5 - 5.0 g/dL  ? GFR, Estimated 15 (L) >60 mL/min  ? Anion gap 9 5 - 15  ?Magnesium     Status: None  ? Collection Time: 01/27/22  4:00 PM  ?Result Value Ref Range  ? Magnesium 1.7 1.7 - 2.4 mg/dL  ?Phosphorus     Status: Abnormal  ? Collection Time: 01/27/22  4:00 PM  ?Result Value Ref Range  ? Phosphorus 4.7 (H) 2.5 - 4.6 mg/dL  ?Glucose, capillary     Status: None  ? Collection Time: 01/27/22  7:36 PM  ?Result Value Ref Range  ? Glucose-Capillary 87 70 - 99 mg/dL  ?Glucose, capillary     Status: Abnormal  ? Collection Time: 01/27/22 11:28 PM  ?  Result Value Ref Range  ? Glucose-Capillary 153 (H) 70 - 99 mg/dL  ?Glucose, capillary     Status: Abnormal  ? Collection Time: 01/28/22  3:14 AM  ?Result Value Ref Range  ? Glucose-Capillary 145 (H) 70 - 99 mg/dL  ?CBC     Status: Abnormal  ? Collection Time: 01/28/22  5:50 AM  ?Result Value Ref Range  ? WBC 13.0 (H) 4.0 - 10.5 K/uL  ? RBC 2.30 (L) 4.22 - 5.81 MIL/uL  ? Hemoglobin 6.9 (LL) 13.0 - 17.0 g/dL  ? HCT 20.8 (L) 39.0 - 52.0 %  ? MCV 90.4 80.0 - 100.0 fL  ? MCH 30.0 26.0 - 34.0 pg  ? MCHC 33.2 30.0 - 36.0 g/dL  ? RDW 15.5 11.5 - 15.5 %  ? Platelets 160 150 - 400 K/uL  ? nRBC 0.0 0.0 - 0.2 %  ?Triglycerides     Status: None  ? Collection Time: 01/28/22  5:50 AM  ?Result Value Ref Range  ? Triglycerides 79 <150 mg/dL  ?Renal function panel (daily at 0500)     Status: Abnormal  ? Collection Time: 01/28/22  5:50 AM  ?Result Value Ref Range  ? Sodium 131 (L) 135 - 145 mmol/L  ? Potassium 5.3 (H) 3.5 - 5.1 mmol/L  ? Chloride 102 98 - 111 mmol/L  ? CO2 18 (L) 22 - 32 mmol/L  ? Glucose, Bld 164 (H) 70 - 99 mg/dL  ? BUN 66 (H) 8 - 23 mg/dL  ?  Creatinine, Ser 4.38 (H) 0.61 - 1.24 mg/dL  ? Calcium 7.0 (L) 8.9 - 10.3 mg/dL  ? Phosphorus 5.1 (H) 2.5 - 4.6 mg/dL  ? Albumin 2.1 (L) 3.5 - 5.0 g/dL  ? GFR, Estimated 13 (L) >60 mL/min  ? Anion gap 11 5 - 15  ?Magnesium     Status: None  ? Collection Time: 01/28/22  5:50 AM  ?Result Value Ref Range  ? Magnesium 1.7 1.7 - 2.4 mg/dL  ?Phosphorus     Status: Abnormal  ? Collection Time: 01/28/22  5:50 AM  ?Result Value Ref Range  ? Phosphorus 5.0 (H) 2.5 - 4.6 mg/dL  ?Glucose, capillary     Status: Abnormal  ? Collection Time: 01/28/22  7:23 AM  ?Result Value Ref Range  ? Glucose-Capillary 139 (H) 70 - 99 mg/dL  ?Prepare RBC (crossmatch)     Status: None  ? Collection Time: 01/28/22  9:49 AM  ?Result Value Ref Range  ? Order Confirmation    ?  ORDER PROCESSED BY BLOOD BANK ?Performed at [facility], [place] [address] [place],  [address] ?  ? ? ?Assessment & Plan: ?The plan of care was discussed with the bedside nurse for the day, Claiborne Billings, who is in agreement with this plan and no additional concerns were raised.  ? ?Present on Admission: ? Fall ? Renal hemorrhage, left ? ? ? LOS: 3 days  ? ?Additional comments:I reviewed the patient's new clinical lab test results.   and I reviewed the patients new imaging test results.   ? ?Fall ?  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  2/2 pRBCs/FFP given along with KCentra for reversal.  Discussed with IR who did not want to proceed with embolization given this is patient's only kidney and risk for renal failure or compromise.  Will treat conservatively for now and monitor his labs closely.  Appreciate Urology evaluation and F/U ?ABL anemia -  secondary to above. Hb has drifted but has been aggressively volume resuscitated. 1u PRBC this AM for hgb 6.9 ?AKI, anuric - despite multiple boluses and lasix challenge. CRRT today for volume overload and hyperkalemia. Dr. Johnney Ou seeing. ?Hyperkalemia - CRRT as  above ?Elevated ST/demand ischemia - initially concerned about STEMI, but given bleed and hypotension secondary to bleed, felt to be demand ischemia and not STEMI. ?Bradycardic arrest with ROSC late 3/13 - Dr. 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  Foley in place 3/13 ?FEN - TF ?VTE - on hold due to continued transfusion requirements, SCDs ?ID - none currently needed ?Dispo - ICU, palliative c/s ?  ? ?Critical Care Total Time: 50 minutes ? ?Jesusita Oka, MD ?Trauma & General Surgery ?Please use [email or web address] to contact on call provider ? ?01/28/2022 ? ?*Care during the described time interval was provided by me. I have reviewed this patient's available data, including medical history, events of note, physical examination and test results as part of my evaluation. ? ? ? ?

## 2022-01-28 NOTE — Progress Notes (Signed)
SLP Cancellation Note ? ?Patient Details ?Name: [name] [name] ?MRN: [id number] ?DOB: Mar 03, 1941 ? ? ?Cancelled treatment:        Received order for swallow. Currently intubated. Will follow ? ? ?Houston Siren ?01/28/2022, 10:01 AM ?

## 2022-01-28 NOTE — Progress Notes (Signed)
ETT marking noted to be 26 cm at lips, previously had been charted at 29 cm at lips. MD Lovick made aware at bedside and chest XR placed to confirm placement.  ?

## 2022-01-28 NOTE — Progress Notes (Signed)
? ?Progress Note ? ?Patient Name: [name] [name] ?Date of Encounter: 01/28/2022 ? ?[facility] Cardiologist: Sherren Mocha, MD  ? ?Subjective  ? ?Intubated, sedated ? ?Inpatient Medications  ?  ?Scheduled Meds: ? sodium chloride   Intravenous Once  ? sodium chloride   Intravenous Once  ? sodium chloride   Intravenous Once  ? sodium chloride   Intravenous Once  ? chlorhexidine gluconate (MEDLINE KIT)  15 mL Mouth Rinse BID  ? Chlorhexidine Gluconate Cloth  6 each Topical Daily  ? feeding supplement (PROSource TF)  45 mL Per Tube BID  ? fentaNYL (SUBLIMAZE) injection  25 mcg Intravenous Once  ? mouth rinse  15 mL Mouth Rinse 10 times per day  ? pantoprazole sodium  40 mg Per Tube Daily  ? sodium chloride flush  10-40 mL Intracatheter Q12H  ? ?Continuous Infusions: ?  prismasol BGK 4/2.5 400 mL/hr at 01/28/22 1059  ?  prismasol BGK 4/2.5 400 mL/hr at 01/28/22 1058  ? sodium chloride Stopped (01/26/22 1720)  ? sodium chloride    ? sodium chloride    ? DOPamine    ? feeding supplement (VITAL 1.5 CAL) Stopped (01/28/22 0759)  ? fentaNYL infusion INTRAVENOUS 125 mcg/hr (01/28/22 1100)  ? norepinephrine (LEVOPHED) Adult infusion 34 mcg/min (01/28/22 1100)  ? prismasol BGK 4/2.5 1,500 mL/hr at 01/28/22 1059  ? propofol (DIPRIVAN) infusion Stopped (01/27/22 1540)  ? ?PRN Meds: ?sodium chloride, Place/Maintain arterial line **AND** sodium chloride, Place/Maintain arterial line **AND** sodium chloride, acetaminophen, fentaNYL, heparin, morphine injection, nitroGLYCERIN, ondansetron **OR** ondansetron (ZOFRAN) IV, oxyCODONE, oxyCODONE, sodium chloride, sodium chloride flush, traZODone  ? ?Vital Signs  ?  ?Vitals:  ? 01/28/22 1000 01/28/22 1017 01/28/22 1110 01/28/22 1115  ?BP: (!) 111/48  (!) 90/51   ?Pulse: 97  88 93  ?Resp: 13 (!) '23 12 10  ' ?Temp:   98.6 ?F (37 ?C)   ?TempSrc:   Axillary   ?SpO2: 100% 99% 100% 100%  ?Weight:      ?Height:      ? ? ?Intake/Output Summary (Last 24 hours) at 01/28/2022 1139 ?Last data filed at  01/28/2022 1122 ?Gross per 24 hour  ?Intake 3712.69 ml  ?Output 57 ml  ?Net 3655.69 ml  ? ?Last 3 Weights 01/28/2022 01/23/2022 02/11/2022  ?Weight (lbs) 209 lb 3.5 oz 175 lb 175 lb  ?Weight (kg) 94.9 kg 79.379 kg 79.379 kg  ?   ? ?Telemetry  ?  ?Afib, rate controlled, no further brady events - Personally Reviewed ? ? ? ?Physical Exam  ?Intubated/sedated ?GEN: No acute distress.   ?Neck: JVP elevated ?Cardiac: Irregularly irregular no murmur gallop ?Respiratory: Clear to auscultation bilaterally. ?GI: Soft, nontender, non-distended  ?MS: Diffuse edema edema; No deformity. ?Neuro: Sedated ?Psych: Unable to assess ? ?Labs  ?  ?High Sensitivity Troponin:   ?Recent Labs  ?Lab 01/26/2022 ?0920 01/19/2022 ?1120  ?TROPONINIHS 23* 57*  ?   ?Chemistry ?Recent Labs  ?Lab 01/22/2022 ?0920 01/21/2022 ?0928 01/26/22 ?2358 01/27/22 ?0127 01/27/22 ?7673 01/27/22 ?1600 01/28/22 ?4193  ?NA 139   < > 133*   < > 133* 130* 131*  ?K 3.7   < > 4.6   < > 5.1 5.7* 5.3*  ?CL 102   < > 101  --  101 103 102  ?CO2 24   < > 17*  --  19* 18* 18*  ?GLUCOSE 200*   < > 180*  --  107* 137* 164*  ?BUN 21   < > 43*  --  46* 55* 66*  ?CREATININE 1.46*   < > 3.33*  --  3.24* 3.93* 4.38*  ?CALCIUM 8.6*   < > 7.4*  --  7.1* 7.2* 7.0*  ?MG 1.8  --   --   --   --  1.7 1.7  ?PROT 6.3*  --  5.2*  --   --   --   --   ?ALBUMIN 2.9*  --  2.4*  --   --  2.3* 2.1*  ?AST 23  --  107*  --   --   --   --   ?ALT 13  --  34  --   --   --   --   ?ALKPHOS 69  --  60  --   --   --   --   ?BILITOT 1.0  --  0.6  --   --   --   --   ?GFRNONAA 48*   < > 18*  --  18* 15* 13*  ?ANIONGAP 13   < > 15  --  '13 9 11  ' ? < > = values in this interval not displayed.  ?  ?Lipids  ?Recent Labs  ?Lab 01/28/22 ?6962  ?TRIG 79  ?  ?Hematology ?Recent Labs  ?Lab 01/27/22 ?0457 01/27/22 ?1145 01/28/22 ?9528  ?WBC 16.2* 16.1* 13.0*  ?RBC 2.27* 2.66* 2.30*  ?HGB 6.8* 8.0* 6.9*  ?HCT 20.6* 24.1* 20.8*  ?MCV 90.7 90.6 90.4  ?MCH 30.0 30.1 30.0  ?MCHC 33.0 33.2 33.2  ?RDW 15.3 15.2 15.5  ?PLT 185 166 160   ? ?Thyroid No results for input(s): TSH, FREET4 in the last 168 hours.  ?BNPNo results for input(s): BNP, PROBNP in the last 168 hours.  ?DDimer No results for input(s): DDIMER in the last 168 hours.  ? ?Radiology  ?  ?DG CHEST PORT 1 VIEW ? ?Result Date: 01/28/2022 ?CLINICAL DATA:  Intubation. EXAM: PORTABLE CHEST 1 VIEW COMPARISON:  01/27/2022 FINDINGS: The endotracheal tube is 4.8 cm above the carina. The NG tube is coursing down the esophagus and into the stomach. The right IJ catheter is stable at the mid SVC level. The left subclavian central venous catheter tip is at the brachiocephalic SVC junction. The cardiomediastinal contours are stable. Persistent bilateral pulmonary infiltrates and left pleural effusion. IMPRESSION: 1. Support apparatus in good position, unchanged. 2. Persistent bilateral pulmonary infiltrates and left pleural effusion. Electronically Signed   By: Marijo Sanes M.D.   On: 01/28/2022 09:35  ? ?DG CHEST PORT 1 VIEW ? ?Result Date: 01/27/2022 ?CLINICAL DATA:  Adjustment of endotracheal tube EXAM: PORTABLE CHEST 1 VIEW COMPARISON:  01/27/2022 FINDINGS: Endotracheal tube is present with tip measuring 5.4 cm above the carina. Enteric tube is present. Tip is off the field of view but below the left hemidiaphragm. A mediastinal drain is present. Right central venous catheter with tip over the mid SVC region. Left central venous catheter tip is heart is on lead positioned, projected over the confluence of the SVC and brachiocephalic vein. No pneumothorax. Patchy infiltrates in the right upper and lower lung and in the left lower lung. Small left pleural effusion. Degenerative changes in the spine and shoulders. IMPRESSION: Appliances are unchanged in position. Persistent patchy bilateral pulmonary infiltrates. Small left pleural effusion. Electronically Signed   By: Lucienne Capers M.D.   On: 01/27/2022 20:28  ? ?DG CHEST PORT 1 VIEW ? ?Result Date: 01/27/2022 ?CLINICAL DATA:  Central line  placement.  Ventilator support. EXAM: PORTABLE CHEST 1  VIEW COMPARISON:  Earlier same day FINDINGS: New right internal jugular central line tip in the SVC at the level of the innominate vein entrance. Left-sided central line tip as its tip at the innominate SVC junction. Endotracheal tube tip 5 cm above the carina. Orogastric or nasogastric tube enters the abdomen. Persistent infiltrate/volume loss in the lower lobes, left worse than right. New patchy density in the right upper lobe. IMPRESSION: New right internal jugular central line tip in the SVC at the level of the innominate vein confluence. No pneumothorax. Persistent lower lobe infiltrate/atelectasis, left worse than right. New patchy density in the right upper lobe. Electronically Signed   By: Nelson Chimes M.D.   On: 01/27/2022 15:52  ? ?DG CHEST PORT 1 VIEW ? ?Result Date: 01/27/2022 ?CLINICAL DATA:  Central line placement EXAM: PORTABLE CHEST 1 VIEW COMPARISON:  01/26/2022 FINDINGS: Endotracheal tube with tip measuring 5.8 cm above the carina. Enteric tube tip is off the field of view. A left central venous catheter has been placed with tip projecting horizontally in the mediastinum with consistent with location at the confluence of the superior vena cava and brachiocephalic veins. No pneumothorax. Shallow inspiration. Heart size is normal for technique. Patchy right perihilar and left basilar infiltrates. Probable small left pleural effusion. Coronary artery stents are suggested. IMPRESSION: 1. Left central venous catheter tip projects over the expected confluence of the superior vena cava and brachiocephalic veins. No pneumothorax. 2. Patchy infiltrates in the lungs with small left pleural effusion. Electronically Signed   By: Lucienne Capers M.D.   On: 01/27/2022 00:36  ? ?DG CHEST PORT 1 VIEW ? ?Result Date: 01/26/2022 ?CLINICAL DATA:  Intubation. EXAM: PORTABLE CHEST 1 VIEW COMPARISON:  Chest radiograph dated 01/26/2022. FINDINGS: The tip of an  endotracheal tube is partially seen approximately 7 cm above the carina. An enteric tube with side-port in the distal esophagus and tip in the proximal stomach. Recommend further advancing by additional 8 cm. [facility]

## 2022-01-28 NOTE — Progress Notes (Signed)
Trauma Event Note ? ?TRN rounded on patient, remains intubated/sedated on fentanyl with no response to painful stimuli upon my assessment. CRRT initiated today. Two units PRBCs given this morning for drop in hbg, see trend below. Patient started on dopamine infusion for episodes of bradycardia this afternoon, maintains HR in the 70s-80s at this time. Checked in with primary nurse, no needs at this time. ? ?Last imported Vital Signs ?BP (!) 99/52   Pulse 77   Temp 98.1 ?F (36.7 ?C) (Oral)   Resp 18   Ht '5\' 11"'$  (1.803 m)   Wt 209 lb 3.5 oz (94.9 kg)   SpO2 98%   BMI 29.18 kg/m?  ? ?Trending CBC ?Recent Labs  ?  01/27/22 ?1145 01/28/22 ?4098 01/28/22 ?1552  ?WBC 16.1* 13.0* 15.4*  ?HGB 8.0* 6.9* 8.2*  ?HCT 24.1* 20.8* 25.0*  ?PLT 166 160 155  ? ? ?Trending Coag's ?No results for input(s): APTT, INR in the last 72 hours. ? ?Trending BMET ?Recent Labs  ?  01/27/22 ?1600 01/28/22 ?1191 01/28/22 ?1552  ?NA 130* 131* 134*  ?K 5.7* 5.3* 5.2*  ?CL 103 102 102  ?CO2 18* 18* 20*  ?BUN 55* 66* 55*  ?CREATININE 3.93* 4.38* 3.67*  ?GLUCOSE 137* 164* 146*  ? ? ? ? ?[place]  ?Trauma Response RN ? ?Please call TRN at [phone] for further assistance. ? ? ?  ?

## 2022-01-28 NOTE — Progress Notes (Signed)
1620: Pt noted to have 3 episodes of HR down into 40s with several episodes of patient HR in mid to high 50s. Called MD Lovick to inform: Patient started on dopamine, titrate levophed at needed, and paged cardiology per MD Lovick. ? ?5465: Spoke with MD Hochrein with cardiology to inform of episodes. MD Hocherin agreeable to plan discussed above. Vital signs stable at this time.  ?

## 2022-01-28 NOTE — Progress Notes (Signed)
Pulled '10mg'$  oxycodone, dropped one '5mg'$  tablet on floor. Wasted '5mg'$  table in sharps with RN Normajean Baxter. Pulled one dose '5mg'$  oxycodone PRN per MAR to fulfill total of 10 mg PRN oxycodone order. Discussed with pharmacist Mal Misty Dohlen ?

## 2022-01-28 NOTE — Progress Notes (Signed)
Initial Nutrition Assessment ? ?DOCUMENTATION CODES:  ? ?Not applicable ? ?INTERVENTION:  ? ?Tube feeding via OG tube: ?Advance Vital 1.5 to 60 ml/h (1440 ml per day) ?Prosource TF 90 ml QID ? ?Provides 2480 kcal, 185 gm protein, 1094 ml free water daily ? ? ?NUTRITION DIAGNOSIS:  ? ?Increased nutrient needs related to acute illness as evidenced by estimated needs. ? ?GOAL:  ? ?Patient will meet greater than or equal to 90% of their needs ? ?MONITOR:  ? ?TF tolerance ? ?REASON FOR ASSESSMENT:  ? ?Consult, Ventilator ?Enteral/tube feeding initiation and management ? ?ASSESSMENT:  ? 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  ? ?Pt discussed during ICU rounds and with RN. CRRT started for severe AKI.  ? ?Spoke with wife and daughter. Family reports that pt was 300 lb after his last heart attack 16 years ago and he lost down to 175 lb. He recently was losing weight to 170 lb by trying to exercise more, mostly lifts weights and eating healthier.  ? ?3/15 TF initiated ?3/16 adv to goal; CRRT initiated ? ?Patient is currently intubated on ventilator  ?Temp (24hrs), Avg:99.3 ?F (37.4 ?C), Min:98.6 ?F (37 ?C), Max:99.9 ?F (37.7 ?C) ? ? ?Medications reviewed and include: protonix  ?Dopamine ?Fentanyl  ?Levophed @ 39 mcg  ? ?Labs reviewed: Na 131, K: 5.3, PO4: 5 ?  ?UOP: 20 ml  ?18 F OG tube: tip in distal stomach/proximal duodenum  ? ?NUTRITION - FOCUSED PHYSICAL EXAM: ? ?Flowsheet Row Most Recent Value  ?Orbital Region No depletion  ?Upper Arm Region No depletion  ?Thoracic and Lumbar Region No depletion  ?Buccal Region Unable to assess  ?Temple Region No depletion  ?Clavicle Bone Region No depletion  ?Clavicle and Acromion Bone Region Moderate depletion  ?Scapular Bone Region Moderate depletion  ?Dorsal Hand No depletion   ?Patellar Region No depletion  ?Anterior Thigh Region No depletion  ?Posterior Calf Region No depletion  ?Edema (RD Assessment) Moderate  ?Hair Reviewed  ?Eyes Unable to assess  ?Mouth Reviewed  ?Skin Reviewed  ?Nails Reviewed  ? ?  ? ? ?Diet Order:   ?Diet Order   ? ?       ?  Diet NPO time specified  Diet effective now       ?  ? ?  ?  ? ?  ? ? ?EDUCATION NEEDS:  ? ?Not appropriate for education at this time ? ?Skin:  Skin Assessment: Reviewed RN Assessment ? ?Last BM:  3/15 x 1 small ? ?Height:  ? ?Ht Readings from Last 1 Encounters:  ?01/27/2022 '5\' 11"'$  (1.803 m)  ? ? ?Weight:  ? ?Wt Readings from Last 1 Encounters:  ?01/28/22 94.9 kg  ? ? ?BMI:  Body mass index is 29.18 kg/m?. ? ?Estimated Nutritional Needs:  ? ?Kcal:  2500 ? ?Protein:  165-205 grams ? ?Fluid:  >2 L/day ? ?Lockie Pares., RD, LDN, CNSC ?See AMiON for contact information  ? ?

## 2022-01-28 NOTE — Progress Notes (Signed)
? ?                                                                                                                                                     ?                                                   ?Daily Progress Note  ? ?Patient Name: [name] [name]       Date: 01/28/2022 ?DOB: 29-Apr-1941  Age: 81 y.o. MRN#: [id number] ?Attending Physician: MdMarilynn Rail, MD ?Primary Care Physician: Lindell Spar, MD ?Admit Date: 01/16/2022 ? ?Reason for Consultation/Follow-up: Establishing goals of care ? ?Patient Profile/HPI:  81 y.o. male  with past medical history of CVA, MI with stents placed, A-fib on Eliquis, CAD, urothelial cancer status post right nephroureterectomy, chronic left pleural effusion admitted on 01/17/2022 with a fall at home.  He landed on his left flank which resulted in a renal hemorrhage from a pre-existing cyst.  He had ST elevation on EKG this is thought to be from demand ischemia from his acute blood loss.  Last night he had severe bradycardia and went into cardiac arrest he required CPR for almost 5 minutes he received epinephrine and atropine , required intubation, he has had decreased urine output and acute urine injury he is now on CRRT.  He was started on pressor support for hypotension.  Per cardiology he likely has sick sinus syndrome.  Palliative consulted for goals of care. ? ?Subjective: ?Evaluated patient, reviewed chart including progress notes, labs and imaging.  ?Patient's spouse- [name] and daughter [name] at bedside.  ?Discussed medical update- hgb low again this morning required transfusion, unable to attempt wean from vent this morning due to increased FiO2 requirements. Needing increased doses of pressors during CRRT- have only now been able to begin to pull fluid off. Family understands [name] is at high risk for decompensation and not being able to make a meaningful recovery. Per our discussion yesterday they did set limits at not continuing life prolonging measures if it looked that  [name] was not going to make a meaningful functional recovery to life without life supporting devices.  ? ?Review of Systems  ?Unable to perform ROS: Intubated  ? ? ?Physical Exam ?Vitals and nursing note reviewed.  ?Constitutional:   ?   Appearance: He is ill-appearing.  ?Cardiovascular:  ?   Comments: Diffuse anasarca ?Skin: ?   Coloration: Skin is pale.  ?Neurological:  ?   Comments: Unresponsive/sedated  ?         ? ?Vital Signs: BP (!) 106/92   Pulse 99   Temp 99.3 ?F (37.4 ?C) (Axillary)   Resp 14  Ht '5\' 11"'$  (1.803 m)   Wt 94.9 kg   SpO2 100%   BMI 29.18 kg/m?  ?SpO2: SpO2: 100 % ?O2 Device: O2 Device: Ventilator ?O2 Flow Rate: O2 Flow Rate (L/min): 3 L/min ? ?Intake/output summary:  ?Intake/Output Summary (Last 24 hours) at 01/28/2022 1433 ?Last data filed at 01/28/2022 1400 ?Gross per 24 hour  ?Intake 3895.48 ml  ?Output 177 ml  ?Net 3718.48 ml  ? ?LBM: Last BM Date : 01/27/22 ?Baseline Weight: Weight: 79.4 kg ?Most recent weight: Weight: 94.9 kg ? ?     ?Palliative Assessment/Data: PPS: 10%  ? ? ? ? ? ?Patient Active Problem List  ? Diagnosis Date Noted  ? Fall 02/11/2022  ? Renal hemorrhage, left 01/21/2022  ? Urothelial carcinoma ([place]) 02/02/2022  ? Encounter for general adult medical examination with abnormal findings 11/05/2021  ? Anemia due to stage 3a chronic kidney disease ([place]) 11/05/2021  ? Subclinical hypothyroidism 11/05/2021  ? Pleural effusion on left 10/13/2021  ? Onychomycosis 05/07/2021  ? Chronic sore throat 05/07/2021  ? Insomnia 01/07/2021  ? H/O: CVA (cerebrovascular accident) 09/29/2020  ? Lumbar degenerative disc disease 11/07/2019  ? Atrial fibrillation ([place]) 07/25/2019  ? S/P right total knee arthroplasty 04/03/2019  ? Body mass index (BMI) 26.0-26.9, adult 09/06/2018  ? History of nephrectomy 08/17/2018  ? Chronic low back pain 08/18/2017  ? OA (osteoarthritis) of knee 01/26/2017  ? 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 Gastroesophageal reflux disease  11/26/2011  ? Sinoatrial node dysfunction ([place]) 10/05/2008  ? Chronic kidney disease, stage III (moderate) ([place]) 10/05/2008  ? CAD (coronary artery disease), native coronary artery 11/21/2007  ? Hypertrophy of prostate without urinary obstruction and other lower urinary tract symptoms (LUTS) 11/21/2007  ? Benign essential hypertension 12/05/2006  ? Sciatica 11/28/2006  ? ? ?Palliative Care Assessment & Plan  ? ? ?Assessment/Recommendations/Plan ? ?Family continues to hope that current interventions will restore patient's ability to liberate from vent and artifical life supporting measures, however, are very clear patient would not want prolonged efforts if recovery did not look to be able to be reasonably obtained ?PMT will continue to follow and support family through decision making process regarding treatment decisions ? ? ?Code Status: ?DNR ? ?Prognosis: ? Unable to determine ? ?Discharge Planning: ?To Be Determined ? ?Care plan was discussed with patient's family. ? ?Thank you for allowing the Palliative Medicine Team to assist in the care of this patient. ? ? ?Mariana Kaufman, AGNP-C ?Palliative Medicine ? ? ?Please contact Palliative Medicine Team phone at [phone] for questions and concerns.  ? ? ? ? ? ? ?

## 2022-01-29 DIAGNOSIS — R58 Hemorrhage, not elsewhere classified: Secondary | ICD-10-CM | POA: Diagnosis not present

## 2022-01-29 DIAGNOSIS — R0603 Acute respiratory distress: Secondary | ICD-10-CM

## 2022-01-29 DIAGNOSIS — R579 Shock, unspecified: Secondary | ICD-10-CM

## 2022-01-29 DIAGNOSIS — I469 Cardiac arrest, cause unspecified: Secondary | ICD-10-CM

## 2022-01-29 DIAGNOSIS — N2889 Other specified disorders of kidney and ureter: Secondary | ICD-10-CM | POA: Diagnosis not present

## 2022-01-29 DIAGNOSIS — Z01818 Encounter for other preprocedural examination: Secondary | ICD-10-CM

## 2022-01-29 DIAGNOSIS — R578 Other shock: Secondary | ICD-10-CM

## 2022-01-29 LAB — CBC
HCT: 25.5 % — ABNORMAL LOW (ref 39.0–52.0)
Hemoglobin: 8.6 g/dL — ABNORMAL LOW (ref 13.0–17.0)
MCH: 30.8 pg (ref 26.0–34.0)
MCHC: 33.7 g/dL (ref 30.0–36.0)
MCV: 91.4 fL (ref 80.0–100.0)
Platelets: 120 10*3/uL — ABNORMAL LOW (ref 150–400)
RBC: 2.79 MIL/uL — ABNORMAL LOW (ref 4.22–5.81)
RDW: 15.7 % — ABNORMAL HIGH (ref 11.5–15.5)
WBC: 9.3 10*3/uL (ref 4.0–10.5)
nRBC: 0 % (ref 0.0–0.2)

## 2022-01-29 LAB — TYPE AND SCREEN
ABO/RH(D): B POS
Antibody Screen: NEGATIVE
Unit division: 0
Unit division: 0
Unit division: 0
Unit division: 0
Unit division: 0

## 2022-01-29 LAB — RENAL FUNCTION PANEL
Albumin: 2.1 g/dL — ABNORMAL LOW (ref 3.5–5.0)
Albumin: 2.1 g/dL — ABNORMAL LOW (ref 3.5–5.0)
Anion gap: 8 (ref 5–15)
Anion gap: 13 (ref 5–15)
BUN: 46 mg/dL — ABNORMAL HIGH (ref 8–23)
BUN: 41 mg/dL — ABNORMAL HIGH (ref 8–23)
CO2: 23 mmol/L (ref 22–32)
CO2: 20 mmol/L — ABNORMAL LOW (ref 22–32)
Calcium: 7.2 mg/dL — ABNORMAL LOW (ref 8.9–10.3)
Calcium: 7.5 mg/dL — ABNORMAL LOW (ref 8.9–10.3)
Chloride: 104 mmol/L (ref 98–111)
Chloride: 102 mmol/L (ref 98–111)
Creatinine, Ser: 2.62 mg/dL — ABNORMAL HIGH (ref 0.61–1.24)
Creatinine, Ser: 2.34 mg/dL — ABNORMAL HIGH (ref 0.61–1.24)
GFR, Estimated: 24 mL/min — ABNORMAL LOW (ref >60)
GFR, Estimated: 27 mL/min — ABNORMAL LOW (ref >60)
Glucose, Bld: 134 mg/dL — ABNORMAL HIGH (ref 70–99)
Glucose, Bld: 136 mg/dL — ABNORMAL HIGH (ref 70–99)
Phosphorus: 3.3 mg/dL (ref 2.5–4.6)
Phosphorus: 3.4 mg/dL (ref 2.5–4.6)
Potassium: 5.3 mmol/L — ABNORMAL HIGH (ref 3.5–5.1)
Potassium: 5.2 mmol/L — ABNORMAL HIGH (ref 3.5–5.1)
Sodium: 135 mmol/L (ref 135–145)
Sodium: 135 mmol/L (ref 135–145)

## 2022-01-29 LAB — GLUCOSE, CAPILLARY
Glucose-Capillary: 117 mg/dL — ABNORMAL HIGH (ref 70–99)
Glucose-Capillary: 107 mg/dL — ABNORMAL HIGH (ref 70–99)
Glucose-Capillary: 106 mg/dL — ABNORMAL HIGH (ref 70–99)
Glucose-Capillary: 100 mg/dL — ABNORMAL HIGH (ref 70–99)

## 2022-01-29 LAB — BPAM RBC
Blood Product Expiration Date: 202304062359
Blood Product Expiration Date: 202304062359
Blood Product Expiration Date: 202304082359
Blood Product Expiration Date: 202304092359
Blood Product Expiration Date: 202304142359
ISSUE DATE / TIME: 202303131112
ISSUE DATE / TIME: 202303131138
ISSUE DATE / TIME: 202303141448
ISSUE DATE / TIME: 202303150616
ISSUE DATE / TIME: 202303161058
Unit Type and Rh: 5100
Unit Type and Rh: 5100
Unit Type and Rh: 7300
Unit Type and Rh: 7300
Unit Type and Rh: 7300

## 2022-01-29 LAB — BASIC METABOLIC PANEL
Anion gap: 9 (ref 5–15)
BUN: 43 mg/dL — ABNORMAL HIGH (ref 8–23)
CO2: 23 mmol/L (ref 22–32)
Calcium: 7.1 mg/dL — ABNORMAL LOW (ref 8.9–10.3)
Chloride: 104 mmol/L (ref 98–111)
Creatinine, Ser: 2.34 mg/dL — ABNORMAL HIGH (ref 0.61–1.24)
GFR, Estimated: 27 mL/min — ABNORMAL LOW (ref >60)
Glucose, Bld: 136 mg/dL — ABNORMAL HIGH (ref 70–99)
Potassium: 5.3 mmol/L — ABNORMAL HIGH (ref 3.5–5.1)
Sodium: 136 mmol/L (ref 135–145)

## 2022-01-29 LAB — POCT I-STAT 7, (LYTES, BLD GAS, ICA,H+H)
Acid-Base Excess: 9 mmol/L — ABNORMAL HIGH (ref 0.0–2.0)
Bicarbonate: 32.2 mmol/L — ABNORMAL HIGH (ref 20.0–28.0)
Calcium, Ion: 1.1 mmol/L — ABNORMAL LOW (ref 1.15–1.40)
HCT: 29 % — ABNORMAL LOW (ref 39.0–52.0)
Hemoglobin: 9.9 g/dL — ABNORMAL LOW (ref 13.0–17.0)
O2 Saturation: 98 %
Potassium: 2.4 mmol/L — CL (ref 3.5–5.1)
Sodium: 144 mmol/L (ref 135–145)
TCO2: 33 mmol/L — ABNORMAL HIGH (ref 22–32)
pCO2 arterial: 39.4 mmHg (ref 32–48)
pH, Arterial: 7.521 — ABNORMAL HIGH (ref 7.35–7.45)
pO2, Arterial: 89 mmHg (ref 83–108)

## 2022-01-29 LAB — MAGNESIUM: Magnesium: 2 mg/dL (ref 1.7–2.4)

## 2022-01-29 MED ORDER — DOCUSATE SODIUM 100 MG PO CAPS: 100.0000 mg | ORAL_CAPSULE | Freq: Two times a day (BID) | ORAL | Status: DC

## 2022-01-29 MED ORDER — HALOPERIDOL 0.5 MG PO TABS
0.5000 mg | ORAL_TABLET | ORAL | Status: DC | PRN
  Filled 2022-01-29: qty 1

## 2022-01-29 MED ORDER — VASOPRESSIN 20 UNITS/100 ML INFUSION FOR SHOCK: 0.0000 [IU]/min | INTRAVENOUS | Status: DC

## 2022-01-29 MED ORDER — ACETAMINOPHEN 650 MG RE SUPP: 650.0000 mg | Freq: Four times a day (QID) | RECTAL | Status: DC | PRN

## 2022-01-29 MED ORDER — POLYVINYL ALCOHOL 1.4 % OP SOLN
1.0000 [drp] | Freq: Four times a day (QID) | OPHTHALMIC | Status: DC | PRN
  Filled 2022-01-29: qty 15

## 2022-01-29 MED ORDER — ONDANSETRON 4 MG PO TBDP: 4.0000 mg | ORAL_TABLET | Freq: Four times a day (QID) | ORAL | Status: DC | PRN

## 2022-01-29 MED ORDER — ONDANSETRON HCL 4 MG/2ML IJ SOLN: 4.0000 mg | Freq: Four times a day (QID) | INTRAMUSCULAR | Status: DC | PRN

## 2022-01-29 MED ORDER — HALOPERIDOL LACTATE 2 MG/ML PO CONC
0.5000 mg | ORAL | Status: DC | PRN
  Filled 2022-01-29: qty 0.3

## 2022-01-29 MED ORDER — CALCIUM GLUCONATE-NACL 1-0.675 GM/50ML-% IV SOLN: 1.0000 g | Freq: Once | INTRAVENOUS | Status: DC

## 2022-01-29 MED ORDER — GLYCOPYRROLATE 0.2 MG/ML IJ SOLN
0.2000 mg | INTRAMUSCULAR | Status: DC | PRN
Start: 2022-01-29 — End: 2022-01-30

## 2022-01-29 MED ORDER — DOCUSATE SODIUM 50 MG/5ML PO LIQD
100.0000 mg | Freq: Two times a day (BID) | ORAL | Status: DC
  Administered 2022-01-29: 100 mg
  Filled 2022-01-29: qty 10

## 2022-01-29 MED ORDER — LORAZEPAM 2 MG/ML IJ SOLN: 1.0000 mg | INTRAMUSCULAR | Status: DC | PRN

## 2022-01-29 MED ORDER — HEPARIN SODIUM (PORCINE) 5000 UNIT/ML IJ SOLN: 5000.0000 [IU] | Freq: Three times a day (TID) | INTRAMUSCULAR | Status: DC

## 2022-01-29 MED ORDER — HEPARIN SODIUM (PORCINE) 5000 UNIT/ML IJ SOLN
5000.0000 [IU] | Freq: Three times a day (TID) | INTRAMUSCULAR | Status: DC
  Administered 2022-01-29: 5000 [IU] via SUBCUTANEOUS
  Filled 2022-01-29 (×2): qty 1

## 2022-01-29 MED ORDER — ACETAMINOPHEN 325 MG PO TABS: 650.0000 mg | ORAL_TABLET | Freq: Four times a day (QID) | ORAL | Status: DC | PRN

## 2022-01-29 MED ORDER — GLYCOPYRROLATE 1 MG PO TABS: 1.0000 mg | ORAL_TABLET | ORAL | Status: DC | PRN

## 2022-01-29 MED ORDER — GLYCOPYRROLATE 0.2 MG/ML IJ SOLN
0.2000 mg | INTRAMUSCULAR | Status: DC | PRN
  Administered 2022-01-29: 0.2 mg via INTRAVENOUS
  Filled 2022-01-29: qty 1

## 2022-01-29 MED ORDER — PRISMASOL BGK 0/2.5 32-2.5 MEQ/L EC SOLN
Status: DC
  Filled 2022-01-29 (×10): qty 5000

## 2022-01-29 MED ORDER — POLYETHYLENE GLYCOL 3350 17 G PO PACK
17.0000 g | PACK | Freq: Once | ORAL | Status: AC
  Administered 2022-01-29: 17 g
  Filled 2022-01-29: qty 1

## 2022-01-29 MED ORDER — BIOTENE DRY MOUTH MT LIQD: 15.0000 mL | OROMUCOSAL | Status: DC | PRN

## 2022-01-29 MED ORDER — HALOPERIDOL LACTATE 5 MG/ML IJ SOLN: 0.5000 mg | INTRAMUSCULAR | Status: DC | PRN

## 2022-02-01 LAB — GLUCOSE, CAPILLARY: Glucose-Capillary: <10 mg/dL — CL (ref 70–99)

## 2022-02-09 MED FILL — Medication: Qty: 1 | Status: AC

## 2022-02-13 NOTE — Progress Notes (Signed)
?[facility] ?Progress Note  ? ?Subjective: Continues on CRRT.  On dopamine and Levophed for pressor support.  Remains ventilated and sedated ? ?Objective ?Vitals:  ? 17-Feb-2022 1013 2022-02-17 1015 02/17/22 1020 02-17-22 1023  ?BP: 105/62 105/62    ?Pulse: 96 97 (!) 101   ?Resp: '18 14 17 ' (!) 22  ?Temp:      ?TempSrc:      ?SpO2: 100% 100% 99% 99%  ?Weight:      ?Height:      ? ?Physical Exam ?General: elderly man intubated and sedated ?Heart: irreg irreg, no rub ?Lungs: coarse BL, dec BS bases ?Abdomen: soft, mod distended, non tender ?Extremities: 2+ diffuse edema of the lower extremities, warm and well perfused ?GU:  foley draining scant bloody urine, scrotal and penile edema and bruising noted ? ?Additional Objective ?Labs: ?Basic Metabolic Panel: ?Recent Labs  ?Lab 01/28/22 ?0511 01/28/22 ?1552 02/17/22 ?0211 17-Feb-2022 ?1735  ?NA 131* 134* 135 144  ?K 5.3* 5.2* 5.3* 2.4*  ?CL 102 102 104  --   ?CO2 18* 20* 23  --   ?GLUCOSE 164* 146* 134*  --   ?BUN 66* 55* 46*  --   ?CREATININE 4.38* 3.67* 2.62*  --   ?CALCIUM 7.0* 6.9* 7.2*  --   ?PHOS 5.0*  5.1* 4.7*  4.8* 3.3  --   ? ?Liver Function Tests: ?Recent Labs  ?Lab 01/20/2022 ?0920 01/26/22 ?2358 01/27/22 ?1600 01/28/22 ?6701 01/28/22 ?1552 02/17/2022 ?0541  ?AST 23 107*  --   --   --   --   ?ALT 13 34  --   --   --   --   ?ALKPHOS 69 60  --   --   --   --   ?BILITOT 1.0 0.6  --   --   --   --   ?PROT 6.3* 5.2*  --   --   --   --   ?ALBUMIN 2.9* 2.4*   < > 2.1* 2.2* 2.1*  ? < > = values in this interval not displayed.  ? ?No results for input(s): LIPASE, AMYLASE in the last 168 hours. ?CBC: ?Recent Labs  ?Lab 01/27/22 ?0457 01/27/22 ?1145 01/28/22 ?4103 01/28/22 ?1552 02/17/22 ?0131 Feb 17, 2022 ?4388  ?WBC 16.2* 16.1* 13.0* 15.4* 9.3  --   ?HGB 6.8* 8.0* 6.9* 8.2* 8.6* 9.9*  ?HCT 20.6* 24.1* 20.8* 25.0* 25.5* 29.0*  ?MCV 90.7 90.6 90.4 93.3 91.4  --   ?PLT 185 166 160 155 120*  --   ? ?Blood Culture ?   ?Component Value Date/Time  ? SDES FLUID PLEURAL LEFT  08/03/2021 1512  ? SDES FLUID PLEURAL 08/03/2021 1512  ? SPECREQUEST BOTTLES DRAWN AEROBIC AND ANAEROBIC 08/03/2021 1512  ? SPECREQUEST BOTTLES DRAWN AEROBIC AND ANAEROBIC 08/03/2021 1512  ? CULT  08/03/2021 1512  ?  NO GROWTH 7 DAYS ?Performed at [facility], [place] [address] [place], [place] [address] ?  ? REPTSTATUS 08/03/2021 FINAL 08/03/2021 1512  ? REPTSTATUS 08/10/2021 FINAL 08/03/2021 1512  ? ? ?Cardiac Enzymes: ?No results for input(s): CKTOTAL, CKMB, CKMBINDEX, TROPONINI in the last 168 hours. ?CBG: ?Recent Labs  ?Lab 01/28/22 ?1612 01/28/22 ?1925 01/28/22 ?2305 Feb 17, 2022 ?0345 2022-02-17 ?7282  ?GLUCAP 125* 140* 118* 117* 107*  ? ?Iron Studies: No results for input(s): IRON, TIBC, TRANSFERRIN, FERRITIN in the last 72 hours. ?'@lablastinr3' @ ?Studies/Results: ?DG CHEST PORT 1 VIEW ? ?Result Date: 01/28/2022 ?CLINICAL DATA:  Intubation. EXAM: PORTABLE CHEST 1 VIEW COMPARISON:  01/27/2022 FINDINGS: The endotracheal  tube is 4.8 cm above the carina. The NG tube is coursing down the esophagus and into the stomach. The right IJ catheter is stable at the mid SVC level. The left subclavian central venous catheter tip is at the brachiocephalic SVC junction. The cardiomediastinal contours are stable. Persistent bilateral pulmonary infiltrates and left pleural effusion. IMPRESSION: 1. Support apparatus in good position, unchanged. 2. Persistent bilateral pulmonary infiltrates and left pleural effusion. Electronically Signed   By: Marijo Sanes M.D.   On: 01/28/2022 09:35  ? ?DG CHEST PORT 1 VIEW ? ?Result Date: 01/27/2022 ?CLINICAL DATA:  Adjustment of endotracheal tube EXAM: PORTABLE CHEST 1 VIEW COMPARISON:  01/27/2022 FINDINGS: Endotracheal tube is present with tip measuring 5.4 cm above the carina. Enteric tube is present. Tip is off the field of view but below the left hemidiaphragm. A mediastinal drain is present. Right central venous catheter with tip over the mid SVC region. Left central venous catheter tip is  heart is on lead positioned, projected over the confluence of the SVC and brachiocephalic vein. No pneumothorax. Patchy infiltrates in the right upper and lower lung and in the left lower lung. Small left pleural effusion. Degenerative changes in the spine and shoulders. IMPRESSION: Appliances are unchanged in position. Persistent patchy bilateral pulmonary infiltrates. Small left pleural effusion. Electronically Signed   By: Lucienne Capers M.D.   On: 01/27/2022 20:28  ? ?DG CHEST PORT 1 VIEW ? ?Result Date: 01/27/2022 ?CLINICAL DATA:  Central line placement.  Ventilator support. EXAM: PORTABLE CHEST 1 VIEW COMPARISON:  Earlier same day FINDINGS: New right internal jugular central line tip in the SVC at the level of the innominate vein entrance. Left-sided central line tip as its tip at the innominate SVC junction. Endotracheal tube tip 5 cm above the carina. Orogastric or nasogastric tube enters the abdomen. Persistent infiltrate/volume loss in the lower lobes, left worse than right. New patchy density in the right upper lobe. IMPRESSION: New right internal jugular central line tip in the SVC at the level of the innominate vein confluence. No pneumothorax. Persistent lower lobe infiltrate/atelectasis, left worse than right. New patchy density in the right upper lobe. Electronically Signed   By: Nelson Chimes M.D.   On: 01/27/2022 15:52   ?Medications: ?  prismasol BGK 4/2.5 400 mL/hr at 01/28/22 2139  ?  prismasol BGK 4/2.5 400 mL/hr at 01/28/22 2148  ? sodium chloride Stopped (01/26/22 1720)  ? sodium chloride    ? sodium chloride    ? DOPamine 20 mcg/kg/min (02-02-2022 1000)  ? feeding supplement (VITAL 1.5 CAL) 1,000 mL (02-02-22 0837)  ? fentaNYL infusion INTRAVENOUS 150 mcg/hr (02-02-22 1000)  ? norepinephrine (LEVOPHED) Adult infusion 4 mcg/min (02-02-2022 1000)  ? prismasol BGK 2/2.5 dialysis solution    ? ? chlorhexidine gluconate (MEDLINE KIT)  15 mL Mouth Rinse BID  ? Chlorhexidine Gluconate Cloth  6 each  Topical Daily  ? docusate  100 mg Per Tube BID  ? feeding supplement (PROSource TF)  90 mL Per Tube QID  ? fentaNYL (SUBLIMAZE) injection  25 mcg Intravenous Once  ? heparin injection (subcutaneous)  5,000 Units Subcutaneous Q8H  ? mouth rinse  15 mL Mouth Rinse 10 times per day  ? pantoprazole sodium  40 mg Per Tube Daily  ? polyethylene glycol  17 g Per Tube Once  ? sodium chloride flush  10-40 mL Intracatheter Q12H  ? ?Assessment/Plan ?**severe AKI on CKD 3:  baseline 1.2-1.49m/dL, solitary kidney s/p remote nephrectomy.  Now with ~anuric  AKI following traumatic hemorrhage into R kidney cyst resulting in shock then further complicated by cardiac arrest.  Family processing [place] at this time and can provide supportive RRT in the meantime which was discussed with family in the past who is agreeable. Continue CRRT as tolerated. Prognosis is guarded.  ?  ?**Hyperkalemia:  Continue CRRT w/ 2K bath, lab this AM with very low K not likely accurate. Repeat level now. ? ?**AHRF:  multifactorial, CXR with infiltrate.  Suspect a degree of pulm edema - will gently UF with CRRT. Limited by hypotension ?  ?**ABLA:  RP hemorrhage from renal cyst s/p fall while on apixiban.  Has required transfusions. Continue transfusions as neededUrology and IR aware but no interventions have been needed. ?  ?**A fib:  off anticoagulation with bleed.  Cardiology is following.  ? ?*GOC: does not want long term support. Continue conversations with palliative. ?  ?Will follow, call with questions/concerns.  ?Palliative care involved. ? ? ?

## 2022-02-13 NOTE — Death Summary Note (Signed)
?DEATH SUMMARY  ? ?Patient Details  ?Name: [name] [name] ?MRN: [id number] ?DOB: [birth year] ? ?Admission/Discharge Information  ? ?Admit Date:  2022/02/13  ?Date of Death: Date of Death: 02-17-22  ?Time of Death: Time of Death: 02/16/1836  ?Length of Stay: 4  ?Referring Physician: Lindell Spar, MD  ? ?Reason(s) for Hospitalization  ?Fall ? ?Diagnoses  ?Preliminary cause of death:  ?Secondary Diagnoses (including complications and co-morbidities):  ?Principal Problem: ?  Renal hemorrhage, left ?Active Problems: ?  Encounter for intubation ?  Fall ?  Retroperitoneal hemorrhage ?  Hemorrhagic shock ([place]) ?  Cardiac arrest [facility]) ?  Respiratory difficulty ? ? ?Brief Hospital Course (including significant findings, care, treatment, and services provided and events leading to death)  ?[name] [name] is a 81 y.o. year old male with a history of CVA, MI with 3 stents, a fib on eliquis (last dose last pm), CAD, urothelial carcinoma s/p R nephroureterectomy (Dr. [name]), chronic left pleural effusion, who was walking in his garage this morning and stumbled and fell and hit his left flank.  He denies any LOC, hitting his head, dizziness, vertigo, or syncopal symptoms.  He was brought in to the ED where he was initially normotensive and then became hypotensive.  His EKG was initially concerning for a STEMI and cardiology was called.  While his CT scans were pending he was taken to the cath lab for an emergent cardiac cath.  However, just before starting, his CT reads returned showing significant L renal hemorrhage with a large cyst full of blood and RTP bleeding.   His cath was not started and he would brought back down to the ED.  His ST elevation was now thought to be secondary to demand ischemia from acute blood loss.  He was given 2 PRBC /2 FFP due to this finding and his hypotension.  He was activated as a level 1 trauma at this time.   ? ?He was admitted to the ICU.  Serial hemoglobins were followed.  He was followed by urology and  cardiology.  Blood pressure improved after initial blood product resuscitation.  Urine output was very poor and creatinine gradually increased.  Overnight on 01/26/2021 he had a couple bradycardic episodes.  The second 1 was treated with atropine.  Shortly thereafter, he had a bradycardic arrest with over 10 minutes of CPR.  He was intubated.  A central line was placed.  He did get ROSC.  Full support was continued including beginning CRRT for his acute kidney injury.  Dialysis catheter was placed and he began CRRT.  He received an additional unit of PRBCs on 3/16.  Palliative care was consulted.  Blood pressure was low requiring pressors.  Family decided to transition to comfort care and expired peacefully. ? ? ? ?Pertinent Labs and Studies  ?Significant Diagnostic Studies ?CT HEAD WO CONTRAST ? ?Result Date: 02-13-22 ?CLINICAL DATA:  [place] with trauma to the head and neck. Anticoagulation. EXAM: CT HEAD WITHOUT CONTRAST TECHNIQUE: Contiguous axial images were obtained from the base of the skull through the vertex without intravenous contrast. RADIATION DOSE REDUCTION: This exam was performed according to the departmental dose-optimization program which includes automated exposure control, adjustment of the mA and/or kV according to patient size and/or use of iterative reconstruction technique. COMPARISON:  09/29/2020 FINDINGS: Brain: Generalized atrophy. Chronic small-vessel ischemic changes of the cerebral hemispheric white matter. No sign of acute infarction, mass lesion, hemorrhage, hydrocephalus or extra-axial collection. Vascular: There is atherosclerotic calcification of the major vessels at the  base of the brain. Skull: No skull fracture. Left tripod fracture is present. Consider maxillofacial CT for full evaluation. Sinuses/Orbits: Traumatic fluid filling the left maxillary sinus. No intraorbital injury seen on this study. Orbital floor fracture associated with the tripod fracture on the left. Other: None  IMPRESSION: Atrophy and chronic small-vessel ischemic changes of the white matter. No acute or traumatic intracranial finding. Tripod fracture of the left zygoma. Maxillofacial CT recommended for full evaluation. Electronically Signed   By: Nelson Chimes M.D.   On: 01/18/2022 10:18  ? ?CT CERVICAL SPINE WO CONTRAST ? ?Result Date: 01/18/2022 ?CLINICAL DATA:  [place] with trauma to the head and neck. EXAM: CT CERVICAL SPINE WITHOUT CONTRAST TECHNIQUE: Multidetector CT imaging of the cervical spine was performed without intravenous contrast. Multiplanar CT image reconstructions were also generated. RADIATION DOSE REDUCTION: This exam was performed according to the departmental dose-optimization program which includes automated exposure control, adjustment of the mA and/or kV according to patient size and/or use of iterative reconstruction technique. COMPARISON:  Radiography 10/16/2019 FINDINGS: Alignment: No malalignment. Skull base and vertebrae: No acute fracture or focal bone lesion. Old anterior loss of height of T1. Bridging anterior osteophytes in the lower cervical and upper thoracic region. Soft tissues and spinal canal: No traumatic soft tissue finding. Disc levels: No significant disc level pathology. Ordinary age related degenerative spondylosis with disc space narrowing, small endplates and mild bulging of the discs. Mild facet osteoarthritis. No apparent compressive bony canal stenosis. Mild foraminal narrowing. Upper chest: Negative Other: None IMPRESSION: No acute or traumatic cervical region finding. Ordinary degenerative spondylosis. Old minor anterior compression deformity at T1. Electronically Signed   By: Nelson Chimes M.D.   On: 01/24/2022 10:20  ? ?DG Pelvis Portable ? ?Result Date: 01/19/2022 ?CLINICAL DATA:  Fall. EXAM: PORTABLE PELVIS 1-2 VIEWS COMPARISON:  CT abdomen pelvis dated July 24, 2019. FINDINGS: There is no evidence of pelvic fracture or diastasis. Unchanged chondroid lesion in the left  intertrochanteric femur, likely an enchondroma. IMPRESSION: 1. No acute osseous abnormality. Electronically Signed   By: Titus Dubin M.D.   On: 01/24/2022 09:49  ? ?DG CHEST PORT 1 VIEW ? ?Result Date: 01/28/2022 ?CLINICAL DATA:  Intubation. EXAM: PORTABLE CHEST 1 VIEW COMPARISON:  01/27/2022 FINDINGS: The endotracheal tube is 4.8 cm above the carina. The NG tube is coursing down the esophagus and into the stomach. The right IJ catheter is stable at the mid SVC level. The left subclavian central venous catheter tip is at the brachiocephalic SVC junction. The cardiomediastinal contours are stable. Persistent bilateral pulmonary infiltrates and left pleural effusion. IMPRESSION: 1. Support apparatus in good position, unchanged. 2. Persistent bilateral pulmonary infiltrates and left pleural effusion. Electronically Signed   By: Marijo Sanes M.D.   On: 01/28/2022 09:35  ? ?DG CHEST PORT 1 VIEW ? ?Result Date: 01/27/2022 ?CLINICAL DATA:  Adjustment of endotracheal tube EXAM: PORTABLE CHEST 1 VIEW COMPARISON:  01/27/2022 FINDINGS: Endotracheal tube is present with tip measuring 5.4 cm above the carina. Enteric tube is present. Tip is off the field of view but below the left hemidiaphragm. A mediastinal drain is present. Right central venous catheter with tip over the mid SVC region. Left central venous catheter tip is heart is on lead positioned, projected over the confluence of the SVC and brachiocephalic vein. No pneumothorax. Patchy infiltrates in the right upper and lower lung and in the left lower lung. Small left pleural effusion. Degenerative changes in the spine and shoulders. IMPRESSION: Appliances are unchanged in  position. Persistent patchy bilateral pulmonary infiltrates. Small left pleural effusion. Electronically Signed   By: Lucienne Capers M.D.   On: 01/27/2022 20:28  ? ?DG CHEST PORT 1 VIEW ? ?Result Date: 01/27/2022 ?CLINICAL DATA:  Central line placement.  Ventilator support. EXAM: PORTABLE CHEST 1  VIEW COMPARISON:  Earlier same day FINDINGS: New right internal jugular central line tip in the SVC at the level of the innominate vein entrance. Left-sided central line tip as its tip at the innominate

## 2022-02-13 NOTE — Progress Notes (Signed)
Clinical update provided to family at bedside regarding increased pressor requirement. Wife elects not to add additional vasopressor and is interested in switching to comfort care +/- compassionate extubation. Awaiting one additional daughter to arrive before stopping any current vasopressors or before extubation. EOL order set placed in preparation for daughter's arrival, expected in the next 3m  ? ?Critical care time: 21m ? ?AyJesusita OkaMD ?General and Trauma Surgery ?[facility] ? ?

## 2022-02-13 NOTE — Progress Notes (Signed)
? ?                                                                                                                                                     ?                                                   ?Daily Progress Note  ? ?Patient Name: [name] [name]       Date: 02/02/22 ?DOB: 04-12-1941  Age: 81 y.o. MRN#: [id number] ?Attending Physician: MdMarilynn Rail, MD ?Primary Care Physician: Lindell Spar, MD ?Admit Date: 01/16/2022 ? ?Reason for Consultation/Follow-up: Establishing goals of care ? ?Patient Profile/HPI:  81 y.o. male  with past medical history of CVA, MI with stents placed, A-fib on Eliquis, CAD, urothelial cancer status post right nephroureterectomy, chronic left pleural effusion admitted on 01/20/2022 with a fall at home.  He landed on his left flank which resulted in a renal hemorrhage from a pre-existing cyst.  He had ST elevation on EKG this is thought to be from demand ischemia from his acute blood loss.  Last night he had severe bradycardia and went into cardiac arrest he required CPR for almost 5 minutes he received epinephrine and atropine , required intubation, he has had decreased urine output and acute urine injury he is now on CRRT.  He was started on pressor support for hypotension.  Per cardiology he likely has sick sinus syndrome.  Palliative consulted for goals of care. ? ?Subjective: ?Evaluated patient, reviewed chart including progress notes, labs and imaging. Patient's spouse [name] and daughter [name] at bedside. He remains intubated, sedated.  ? ?Created space and opportunity for patient's family to express thoughts and feelings on his current illness. We discussed the challenges of CRRT filtration and family is concerned that he will not get enough benefit over the next 24 to 48 hours given limitation by hypotension.  Patient's daughter Marzetta Board notes he looked towards her today when she spoke, she also expresses her realization that many of these instances are due to reflexes.  Emotional  support was provided, acknowledging the difficulty of this waiting period to determine if he will make some improvement before decision on possible transition to comfort.  Family expresses desire to be present during tomorrow's sedation wean and this was discussed with RN. ? ?Hard choices for loving people and gone from my sight booklets were provided today for review.  Family was encouraged to call with needs. ? ?Questions and concerns addressed. PMT will continue to support holistically. ? ?Review of Systems  ?Unable to perform ROS: Intubated  ? ? ?Physical Exam ?Vitals and nursing note reviewed.  ?Constitutional:   ?   Appearance:  He is ill-appearing.  ?   Interventions: He is sedated and intubated.  ?Cardiovascular:  ?   Rate and Rhythm: Normal rate.  ?   Comments: Diffuse anasarca ?Pulmonary:  ?   Effort: He is intubated.  ?Skin: ?   Coloration: Skin is pale.  ?Neurological:  ?   Comments: Unresponsive/sedated  ?         ? ?Vital Signs: BP 119/74   Pulse 87   Temp 98.3 ?F (36.8 ?C) (Axillary)   Resp 20   Ht '5\' 11"'$  (1.803 m)   Wt 93.9 kg   SpO2 100%   BMI 28.87 kg/m?  ?SpO2: SpO2: 100 % ?O2 Device: O2 Device: Ventilator ?O2 Flow Rate: O2 Flow Rate (L/min): 3 L/min ? ?Intake/output summary:  ?Intake/Output Summary (Last 24 hours) at 02-13-22 1338 ?Last data filed at 2022-02-13 1308 ?Gross per 24 hour  ?Intake 3068.84 ml  ?Output 3500 ml  ?Net -431.16 ml  ? ? ?LBM: Last BM Date : 01/27/22 ?Baseline Weight: Weight: 79.4 kg ?Most recent weight: Weight: 93.9 kg ? ?     ?Palliative Assessment/Data: PPS: 10%  ? ? ? ? ? ?Patient Active Problem List  ? Diagnosis Date Noted  ? Fall 02/12/2022  ? Renal hemorrhage, left 01/13/2022  ? Urothelial carcinoma ([place]) 02/07/2022  ? Encounter for general adult medical examination with abnormal findings 11/05/2021  ? Anemia due to stage 3a chronic kidney disease ([place]) 11/05/2021  ? Subclinical hypothyroidism 11/05/2021  ? Pleural effusion on left 10/13/2021  ? Onychomycosis  05/07/2021  ? Chronic sore throat 05/07/2021  ? Insomnia 01/07/2021  ? H/O: CVA (cerebrovascular accident) 09/29/2020  ? Lumbar degenerative disc disease 11/07/2019  ? Atrial fibrillation ([place]) 07/25/2019  ? S/P right total knee arthroplasty 04/03/2019  ? Body mass index (BMI) 26.0-26.9, adult 09/06/2018  ? History of nephrectomy 08/17/2018  ? Chronic low back pain 08/18/2017  ? OA (osteoarthritis) of knee 01/26/2017  ? 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 Gastroesophageal reflux disease 11/26/2011  ? Sinoatrial node dysfunction ([place]) 10/05/2008  ? Chronic kidney disease, stage III (moderate) ([place]) 10/05/2008  ? CAD (coronary artery disease), native coronary artery 11/21/2007  ? Hypertrophy of prostate without urinary obstruction and other lower urinary tract symptoms (LUTS) 11/21/2007  ? Benign essential hypertension 12/05/2006  ? Sciatica 11/28/2006  ? ? ?Palliative Care Assessment & Plan  ? ? ?Assessment/Recommendations/Plan ? ?Continue watchful waiting to determine whether patient will make a meaningful recovery ?Psychosocial and emotional support provided ?Resources on decision making process regarding treatment decisions and end of life expectations were provided ?PMT will continue to follow and support ? ?Code Status: ?DNR ? ?Prognosis: ? Very guarded ? ?Discharge Planning: ?To Be Determined ? ?Care plan was discussed with patient's family, RN ? ? ?MDM: High ? ? ?Dorthy Cooler, PA-C ?Palliative Medicine Team ?Team phone # [phone] ? ?Thank you for allowing the Palliative Medicine Team to assist in the care of this patient. Please utilize secure chat with additional questions, if there is no response within 30 minutes please call the above phone number. ? ?Palliative Medicine Team providers are available by phone from 7am to 7pm daily and can be reached through the team cell phone.  ?Should this patient require assistance outside of these hours, please call the patient's attending  physician.  ? ? ?

## 2022-02-13 NOTE — Procedures (Signed)
Extubation Procedure Note ? ?Patient Details:   ?Name: [name] [name] ?DOB: [birth year] ?MRN: [id number] ?  ?Airway Documentation:  ?  ?Vent end date: 02-21-2022 Vent end time: 1831  ? ?Evaluation ? O2 sats: stable throughout ?Complications: No apparent complications ?Patient did tolerate procedure well. ?Bilateral Breath Sounds: Rhonchi, Diminished ?  ?No, pt could not speak.  Pt extubated to room air in accordance with family's wishes. ? ?Earney Navy ?21-Feb-2022, 6:31 PM ? ?

## 2022-02-13 NOTE — Progress Notes (Addendum)
? ?Progress Note ? ?Patient Name: [name] [name] ?Date of Encounter: January 31, 2022 ? ?[facility] Cardiologist: Sherren Mocha, MD  ? ?Subjective  ? ?Intubated, sedated.  Daughter and son-in-law at the bedside.  Events noted, patient requiring dopamine and Levophed to maintain adequate blood pressure. ? ?Inpatient Medications  ?  ?Scheduled Meds: ? chlorhexidine gluconate (MEDLINE KIT)  15 mL Mouth Rinse BID  ? Chlorhexidine Gluconate Cloth  6 each Topical Daily  ? docusate  100 mg Per Tube BID  ? feeding supplement (PROSource TF)  90 mL Per Tube QID  ? fentaNYL (SUBLIMAZE) injection  25 mcg Intravenous Once  ? heparin injection (subcutaneous)  5,000 Units Subcutaneous Q8H  ? mouth rinse  15 mL Mouth Rinse 10 times per day  ? pantoprazole sodium  40 mg Per Tube Daily  ? polyethylene glycol  17 g Per Tube Once  ? sodium chloride flush  10-40 mL Intracatheter Q12H  ? ?Continuous Infusions: ?  prismasol BGK 4/2.5 400 mL/hr at 01/28/22 2139  ?  prismasol BGK 4/2.5 400 mL/hr at 01/28/22 2148  ? sodium chloride Stopped (01/26/22 1720)  ? sodium chloride    ? sodium chloride    ? DOPamine 20 mcg/kg/min (01/31/22 1000)  ? feeding supplement (VITAL 1.5 CAL) 1,000 mL (31-Jan-2022 0837)  ? fentaNYL infusion INTRAVENOUS 150 mcg/hr (Jan 31, 2022 1000)  ? norepinephrine (LEVOPHED) Adult infusion 4 mcg/min (2022/01/31 1000)  ? prismasol BGK 2/2.5 dialysis solution    ? ?PRN Meds: ?sodium chloride, Place/Maintain arterial line **AND** sodium chloride, Place/Maintain arterial line **AND** sodium chloride, acetaminophen, fentaNYL, heparin, midazolam, morphine injection, nitroGLYCERIN, ondansetron **OR** ondansetron (ZOFRAN) IV, oxyCODONE, oxyCODONE, sodium chloride, sodium chloride flush, traZODone  ? ?Vital Signs  ?  ?Vitals:  ? January 31, 2022 1013 01/31/2022 1015 01-31-2022 1020 01-31-2022 1023  ?BP: 105/62 105/62    ?Pulse: 96 97 (!) 101   ?Resp: '18 14 17 ' (!) 22  ?Temp:      ?TempSrc:      ?SpO2: 100% 100% 99% 99%  ?Weight:      ?Height:       ? ? ?Intake/Output Summary (Last 24 hours) at 01/31/2022 1031 ?Last data filed at 2022/01/31 1000 ?Gross per 24 hour  ?Intake 3277.05 ml  ?Output 2921 ml  ?Net 356.05 ml  ? ?Last 3 Weights 2022/01/31 01/28/2022 02/04/2022  ?Weight (lbs) 207 lb 0.2 oz 209 lb 3.5 oz 175 lb  ?Weight (kg) 93.9 kg 94.9 kg 79.379 kg  ?   ? ?Telemetry  ?  ?Atrial fibrillation, heart rate currently in the 90s, no significant bradycardic events over the last 24 hours based on my review - Personally Reviewed ? ? ?Physical Exam  ?Intubated, sedated, unresponsive ?GEN: No acute distress.   ?Neck: JVP elevated ?Cardiac: Irregularly irregular, no murmurs, rubs, or gallops.  ?Respiratory: Coarse breath sounds bilaterally. ?GI: Distended ?MS: Diffuse 2+ edema in the arms and legs ; No deformity. ?Neuro: Sedated, unresponsive ?Psych: Unable to assess ? ?Labs  ?  ?High Sensitivity Troponin:   ?Recent Labs  ?Lab 01/26/2022 ?0920 02/07/2022 ?1120  ?TROPONINIHS 23* 57*  ?   ?Chemistry ?Recent Labs  ?Lab 01/21/2022 ?0920 02/03/2022 ?0928 01/26/22 ?2358 01/27/22 ?0127 01/28/22 ?1027 01/28/22 ?1552 01-31-2022 ?2536 January 31, 2022 ?6440  ?NA 139   < > 133*   < > 131* 134* 135 144  ?K 3.7   < > 4.6   < > 5.3* 5.2* 5.3* 2.4*  ?CL 102   < > 101   < > 102 102 104  --   ?  CO2 24   < > 17*   < > 18* 20* 23  --   ?GLUCOSE 200*   < > 180*   < > 164* 146* 134*  --   ?BUN 21   < > 43*   < > 66* 55* 46*  --   ?CREATININE 1.46*   < > 3.33*   < > 4.38* 3.67* 2.62*  --   ?CALCIUM 8.6*   < > 7.4*   < > 7.0* 6.9* 7.2*  --   ?MG 1.8  --   --    < > 1.7 1.8 2.0  --   ?PROT 6.3*  --  5.2*  --   --   --   --   --   ?ALBUMIN 2.9*  --  2.4*   < > 2.1* 2.2* 2.1*  --   ?AST 23  --  107*  --   --   --   --   --   ?ALT 13  --  34  --   --   --   --   --   ?ALKPHOS 69  --  60  --   --   --   --   --   ?BILITOT 1.0  --  0.6  --   --   --   --   --   ?GFRNONAA 48*   < > 18*   < > 13* 16* 24*  --   ?ANIONGAP 13   < > 15   < > '11 12 8  ' --   ? < > = values in this interval not displayed.  ?  ?Lipids  ?Recent  Labs  ?Lab 01/28/22 ?2025  ?TRIG 79  ?  ?Hematology ?Recent Labs  ?Lab 01/28/22 ?4270 01/28/22 ?1552 10-Feb-2022 ?6237 02/10/2022 ?6283  ?WBC 13.0* 15.4* 9.3  --   ?RBC 2.30* 2.68* 2.79*  --   ?HGB 6.9* 8.2* 8.6* 9.9*  ?HCT 20.8* 25.0* 25.5* 29.0*  ?MCV 90.4 93.3 91.4  --   ?MCH 30.0 30.6 30.8  --   ?MCHC 33.2 32.8 33.7  --   ?RDW 15.5 15.6* 15.7*  --   ?PLT 160 155 120*  --   ? ?Thyroid No results for input(s): TSH, FREET4 in the last 168 hours.  ?BNPNo results for input(s): BNP, PROBNP in the last 168 hours.  ?DDimer No results for input(s): DDIMER in the last 168 hours.  ? ?Radiology  ?  ?DG CHEST PORT 1 VIEW ? ?Result Date: 01/28/2022 ?CLINICAL DATA:  Intubation. EXAM: PORTABLE CHEST 1 VIEW COMPARISON:  01/27/2022 FINDINGS: The endotracheal tube is 4.8 cm above the carina. The NG tube is coursing down the esophagus and into the stomach. The right IJ catheter is stable at the mid SVC level. The left subclavian central venous catheter tip is at the brachiocephalic SVC junction. The cardiomediastinal contours are stable. Persistent bilateral pulmonary infiltrates and left pleural effusion. IMPRESSION: 1. Support apparatus in good position, unchanged. 2. Persistent bilateral pulmonary infiltrates and left pleural effusion. Electronically Signed   By: Marijo Sanes M.D.   On: 01/28/2022 09:35  ? ?DG CHEST PORT 1 VIEW ? ?Result Date: 01/27/2022 ?CLINICAL DATA:  Adjustment of endotracheal tube EXAM: PORTABLE CHEST 1 VIEW COMPARISON:  01/27/2022 FINDINGS: Endotracheal tube is present with tip measuring 5.4 cm above the carina. Enteric tube is present. Tip is off the field of view but below the left hemidiaphragm. A mediastinal drain is present. Right central venous catheter  with tip over the mid SVC region. Left central venous catheter tip is heart is on lead positioned, projected over the confluence of the SVC and brachiocephalic vein. No pneumothorax. Patchy infiltrates in the right upper and lower lung and in the left lower  lung. Small left pleural effusion. Degenerative changes in the spine and shoulders. IMPRESSION: Appliances are unchanged in position. Persistent patchy bilateral pulmonary infiltrates. Small left pleural effusion. Electronically Signed   By: Lucienne Capers M.D.   On: 01/27/2022 20:28  ? ?DG CHEST PORT 1 VIEW ? ?Result Date: 01/27/2022 ?CLINICAL DATA:  Central line placement.  Ventilator support. EXAM: PORTABLE CHEST 1 VIEW COMPARISON:  Earlier same day FINDINGS: New right internal jugular central line tip in the SVC at the level of the innominate vein entrance. Left-sided central line tip as its tip at the innominate SVC junction. Endotracheal tube tip 5 cm above the carina. Orogastric or nasogastric tube enters the abdomen. Persistent infiltrate/volume loss in the lower lobes, left worse than right. New patchy density in the right upper lobe. IMPRESSION: New right internal jugular central line tip in the SVC at the level of the innominate vein confluence. No pneumothorax. Persistent lower lobe infiltrate/atelectasis, left worse than right. New patchy density in the right upper lobe. Electronically Signed   By: Nelson Chimes M.D.   On: 01/27/2022 15:52   ? ? ?Patient Profile  ?   ?81 y.o. male with permanent atrial fibrillation on [place] and coronary artery disease s/p remote stenting, presenting with traumatic retroperitoneal/renal hemorrhage ? ?Assessment & Plan  ?  ?1.  Retroperitoneal hemorrhage: Management per trauma service.  Today's hemoglobin improved at 9.9 after packed red blood cell transfusions. ?2.  Acute oliguric renal failure: Now on CRRT, does not appear to be tolerating very well as he is requiring increased vasopressor support. ?3.  Cardiac arrest, resuscitated and now stabilized.  Telemetry is reviewed and there are no further bradycardic events.  Patient remains in atrial fibrillation with controlled rate.  I would not consider him a candidate for temporary pacemaker placement if he has further  bradycardia, but his heart rhythm appears stable at this time. ?4.  Atrial fibrillation, permanent: Off of anticoagulation in the setting of large retroperitoneal hemorrhage ?5.  Shock: Suspect multifactorial, curren

## 2022-02-13 NOTE — Progress Notes (Signed)
? ?Trauma/Critical Care Follow Up Note ? ?Subjective:  ?  ?Overnight Issues:  ? ?Objective:  ?Vital signs for last 24 hours: ?Temp:  [97.5 ?F (36.4 ?C)-99.3 ?F (37.4 ?C)] 98.1 ?F (36.7 ?C) (03/17 0400) ?Pulse Rate:  [57-101] 96 (03/17 0700) ?Resp:  [0-31] 24 (03/17 0729) ?BP: (85-141)/(43-121) 90/55 (03/17 0600) ?SpO2:  [93 %-100 %] 96 % (03/17 0729) ?Arterial Line BP: (80-149)/(45-71) 138/56 (03/17 0700) ?FiO2 (%):  [40 %-100 %] 40 % (03/17 0729) ?Weight:  [93.9 kg-94.9 kg] 93.9 kg (03/17 0500) ? ?Hemodynamic parameters for last 24 hours: ?  ? ?Intake/Output from previous day: ?03/16 0701 - 03/17 0700 ?In: 3355.2 [I.V.:1470.2; Blood:345; PF/XT:0240] ?Out: 2549 [Urine:5]  ?Intake/Output this shift: ?No intake/output data recorded. ? ?Vent settings for last 24 hours: ?Vent Mode: PRVC ?FiO2 (%):  [40 %-100 %] 40 % ?Set Rate:  [20 bmp] 20 bmp ?Vt Set:  [620 mL] 620 mL ?PEEP:  [5 cmH20-8 cmH20] 8 cmH20 ?Plateau Pressure:  [19 cmH20-28 cmH20] 26 cmH20 ? ?Physical Exam:  ?Gen: comfortable, no distress ?Neuro: not f/c ?HEENT: PERRL ?Neck: supple ?CV: RRR ?Pulm: unlabored breathing on MV ?Abd: soft, NT ?GU: anuric, 5cc of blood in the last 24h ?Extr: wwp, 2+ edema ? ? ?Results for orders placed or performed during the hospital encounter of 01/24/2022 (from the past 24 hour(s))  ?Prepare RBC (crossmatch)     Status: None  ? Collection Time: 01/28/22  9:49 AM  ?Result Value Ref Range  ? Order Confirmation    ?  ORDER PROCESSED BY BLOOD BANK ?Performed at [facility], [place] [address] [place], [place] [address] ?  ?MRSA Next Gen by PCR, Nasal     Status: None  ? Collection Time: 01/28/22 11:00 AM  ? Specimen: Nasal Mucosa; Nasal Swab  ?Result Value Ref Range  ? MRSA by PCR Next Gen NOT DETECTED NOT DETECTED  ?Glucose, capillary     Status: None  ? Collection Time: 01/28/22 11:14 AM  ?Result Value Ref Range  ? Glucose-Capillary 88 70 - 99 mg/dL  ?Renal function panel (daily at 1600)     Status: Abnormal  ? Collection Time:  01/28/22  3:52 PM  ?Result Value Ref Range  ? Sodium 134 (L) 135 - 145 mmol/L  ? Potassium 5.2 (H) 3.5 - 5.1 mmol/L  ? Chloride 102 98 - 111 mmol/L  ? CO2 20 (L) 22 - 32 mmol/L  ? Glucose, Bld 146 (H) 70 - 99 mg/dL  ? BUN 55 (H) 8 - 23 mg/dL  ? Creatinine, Ser 3.67 (H) 0.61 - 1.24 mg/dL  ? Calcium 6.9 (L) 8.9 - 10.3 mg/dL  ? Phosphorus 4.8 (H) 2.5 - 4.6 mg/dL  ? Albumin 2.2 (L) 3.5 - 5.0 g/dL  ? GFR, Estimated 16 (L) >60 mL/min  ? Anion gap 12 5 - 15  ?Magnesium     Status: None  ? Collection Time: 01/28/22  3:52 PM  ?Result Value Ref Range  ? Magnesium 1.8 1.7 - 2.4 mg/dL  ?Phosphorus     Status: Abnormal  ? Collection Time: 01/28/22  3:52 PM  ?Result Value Ref Range  ? Phosphorus 4.7 (H) 2.5 - 4.6 mg/dL  ?CBC     Status: Abnormal  ? Collection Time: 01/28/22  3:52 PM  ?Result Value Ref Range  ? WBC 15.4 (H) 4.0 - 10.5 K/uL  ? RBC 2.68 (L) 4.22 - 5.81 MIL/uL  ? Hemoglobin 8.2 (L) 13.0 - 17.0 g/dL  ? HCT 25.0 (L) 39.0 -  52.0 %  ? MCV 93.3 80.0 - 100.0 fL  ? MCH 30.6 26.0 - 34.0 pg  ? MCHC 32.8 30.0 - 36.0 g/dL  ? RDW 15.6 (H) 11.5 - 15.5 %  ? Platelets 155 150 - 400 K/uL  ? nRBC 0.0 0.0 - 0.2 %  ?Glucose, capillary     Status: Abnormal  ? Collection Time: 01/28/22  4:12 PM  ?Result Value Ref Range  ? Glucose-Capillary 125 (H) 70 - 99 mg/dL  ?Glucose, capillary     Status: Abnormal  ? Collection Time: 01/28/22  7:25 PM  ?Result Value Ref Range  ? Glucose-Capillary 140 (H) 70 - 99 mg/dL  ?Glucose, capillary     Status: Abnormal  ? Collection Time: 01/28/22 11:05 PM  ?Result Value Ref Range  ? Glucose-Capillary 118 (H) 70 - 99 mg/dL  ?Glucose, capillary     Status: Abnormal  ? Collection Time: 02/04/2022  3:45 AM  ?Result Value Ref Range  ? Glucose-Capillary 117 (H) 70 - 99 mg/dL  ?CBC     Status: Abnormal  ? Collection Time: Feb 04, 2022  5:41 AM  ?Result Value Ref Range  ? WBC 9.3 4.0 - 10.5 K/uL  ? RBC 2.79 (L) 4.22 - 5.81 MIL/uL  ? Hemoglobin 8.6 (L) 13.0 - 17.0 g/dL  ? HCT 25.5 (L) 39.0 - 52.0 %  ? MCV 91.4 80.0 - 100.0  fL  ? MCH 30.8 26.0 - 34.0 pg  ? MCHC 33.7 30.0 - 36.0 g/dL  ? RDW 15.7 (H) 11.5 - 15.5 %  ? Platelets 120 (L) 150 - 400 K/uL  ? nRBC 0.0 0.0 - 0.2 %  ?Renal function panel (daily at 0500)     Status: Abnormal  ? Collection Time: 02-04-22  5:41 AM  ?Result Value Ref Range  ? Sodium 135 135 - 145 mmol/L  ? Potassium 5.3 (H) 3.5 - 5.1 mmol/L  ? Chloride 104 98 - 111 mmol/L  ? CO2 23 22 - 32 mmol/L  ? Glucose, Bld 134 (H) 70 - 99 mg/dL  ? BUN 46 (H) 8 - 23 mg/dL  ? Creatinine, Ser 2.62 (H) 0.61 - 1.24 mg/dL  ? Calcium 7.2 (L) 8.9 - 10.3 mg/dL  ? Phosphorus 3.3 2.5 - 4.6 mg/dL  ? Albumin 2.1 (L) 3.5 - 5.0 g/dL  ? GFR, Estimated 24 (L) >60 mL/min  ? Anion gap 8 5 - 15  ?Magnesium     Status: None  ? Collection Time: Feb 04, 2022  5:41 AM  ?Result Value Ref Range  ? Magnesium 2.0 1.7 - 2.4 mg/dL  ? ? ?Assessment & Plan: ?The plan of care was discussed with the bedside nurse for the day, who is in agreement with this plan and no additional concerns were raised.  ? ?Present on Admission: ? Fall ? Renal hemorrhage, left ? ? ? LOS: 4 days  ? ?Additional comments:I reviewed the patient's new clinical lab test results.   and I reviewed the patients new imaging test results.   ? ?Fall ?  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  2/2 pRBCs/FFP given along with KCentra for reversal.  Discussed with IR who did not want to proceed with embolization given this is patient's only kidney and risk for renal failure or compromise.  Will treat conservatively for now and monitor his labs closely.  Appreciate Urology evaluation and F/U ?ABL anemia - secondary to above. Hb has drifted but has been aggressively volume resuscitated.  1u PRBC 3/17 for hgb 6.9, appropriate response and stable this AM ?AKI, anuric - despite multiple boluses and lasix challenge. CRRT today for volume overload and hyperkalemia. Dr. Johnney Ou seeing. Goal 1L net negative today if possible.  ?Hyperkalemia - CRRT  as above ?Elevated ST/demand ischemia - initially concerned about STEMI, but given bleed and hypotension secondary to bleed, felt to be demand ischemia and not STEMI. ?Bradycardic arrest with ROSC late 3/13 - Dr. 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 Per cards, not a candidate for TVP. Currently on dopamine.  ?Cardiogenic shock - off levophed, now on dopamine ?Thrombocytopenia - cont to monitor, may worsen with CRRT ?H/O a fib - eliquis on hold ?H/O CAD, MI, stents ?H/O urothelial carcinoma - s/p R nephroureterectomy, Dr. 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  Foley in place 3/13 ?FEN - TF ?VTE - was on hold due to transfusion requirements, start SQH today, SCDs ?ID - none currently needed ?Dispo - ICU, palliative c/s ? ?Critical Care Total Time: 60 minutes ? ?Jesusita Oka, MD ?Trauma & General Surgery ?Please use [email or web address] to contact on call provider ? ?02-04-22 ? ?*Care during the described time interval was provided by me. I have reviewed this patient's available data, including medical history, events of note, physical examination and test results as part of my evaluation. ? ? ? ?

## 2022-02-13 NOTE — Progress Notes (Signed)
Patient over the last thirty minutes to hour  patient has required significantly more vasopressor to maintain MAP >65/ SBP >90. Unable to read a pulse ox for oxygen saturation, despite several attempts to obtain. At 1655, patients right pupil noted to be 5/round./sluggish, left pupil 4/round not reactive. Md Bobbye Morton brought to bedside and is currently talking to family.  ?

## 2022-02-13 NOTE — Procedures (Signed)
I saw and evaluated the patient on CRRT.  Ongoing hypotension limiting ultrafiltration.  Switching to 2K bath ? ?Filed Weights  ? 01/31/2022 0917 01/28/22 0800 February 22, 2022 0500  ?Weight: 79.4 kg 94.9 kg 93.9 kg  ? ? ?Recent Labs  ?Lab 2022/02/22 ?1607 02-22-22 ?3710  ?NA 135 144  ?K 5.3* 2.4*  ?CL 104  --   ?CO2 23  --   ?GLUCOSE 134*  --   ?BUN 46*  --   ?CREATININE 2.62*  --   ?CALCIUM 7.2*  --   ?PHOS 3.3  --   ? ? ?Recent Labs  ?Lab 01/28/22 ?6269 01/28/22 ?1552 22-Feb-2022 ?4854 Feb 22, 2022 ?6270  ?WBC 13.0* 15.4* 9.3  --   ?HGB 6.9* 8.2* 8.6* 9.9*  ?HCT 20.8* 25.0* 25.5* 29.0*  ?MCV 90.4 93.3 91.4  --   ?PLT 160 155 120*  --   ? ? ?Scheduled Meds: ? chlorhexidine gluconate (MEDLINE KIT)  15 mL Mouth Rinse BID  ? Chlorhexidine Gluconate Cloth  6 each Topical Daily  ? docusate  100 mg Per Tube BID  ? feeding supplement (PROSource TF)  90 mL Per Tube QID  ? fentaNYL (SUBLIMAZE) injection  25 mcg Intravenous Once  ? heparin injection (subcutaneous)  5,000 Units Subcutaneous Q8H  ? mouth rinse  15 mL Mouth Rinse 10 times per day  ? pantoprazole sodium  40 mg Per Tube Daily  ? polyethylene glycol  17 g Per Tube Once  ? sodium chloride flush  10-40 mL Intracatheter Q12H  ? ?Continuous Infusions: ?  prismasol BGK 4/2.5 400 mL/hr at 01/28/22 2139  ?  prismasol BGK 4/2.5 400 mL/hr at 01/28/22 2148  ? sodium chloride Stopped (01/26/22 1720)  ? sodium chloride    ? sodium chloride    ? DOPamine 20 mcg/kg/min (2022-02-22 1000)  ? feeding supplement (VITAL 1.5 CAL) 1,000 mL (02-22-2022 0837)  ? fentaNYL infusion INTRAVENOUS 150 mcg/hr (Feb 22, 2022 1000)  ? norepinephrine (LEVOPHED) Adult infusion 4 mcg/min (2022/02/22 1000)  ? prismasol BGK 2/2.5 dialysis solution    ? ?PRN Meds:.sodium chloride, Place/Maintain arterial line **AND** sodium chloride, Place/Maintain arterial line **AND** sodium chloride, acetaminophen, fentaNYL, heparin, midazolam, morphine injection, nitroGLYCERIN, ondansetron **OR** ondansetron (ZOFRAN) IV, oxyCODONE, oxyCODONE,  sodium chloride, sodium chloride flush, traZODone   ?Santiago Bumpers,  MD ?2022-02-22, 10:41 AM ? ? ?

## 2022-02-13 NOTE — Progress Notes (Signed)
Patient ID: [name] [name], male   DOB: 10-22-1941, 81 y.o.   MRN: [id number] ? ?4 Days Post-Op ?Subjective: ?Pt remains intubated.  Received 1 unit PRBCs yesterday. ? ?Objective: ?Vital signs in last 24 hours: ?Temp:  [97.5 ?F (36.4 ?C)-99.3 ?F (37.4 ?C)] 98.1 ?F (36.7 ?C) (03/17 0400) ?Pulse Rate:  [57-101] 96 (03/17 0700) ?Resp:  [0-31] 24 (03/17 0729) ?BP: (85-141)/(43-121) 90/55 (03/17 0600) ?SpO2:  [94 %-100 %] 96 % (03/17 0729) ?Arterial Line BP: (80-149)/(45-71) 138/56 (03/17 0700) ?FiO2 (%):  [40 %-100 %] 40 % (03/17 0729) ?Weight:  [93.9 kg] 93.9 kg (03/17 0500) ? ?Intake/Output from previous day: ?03/16 0701 - 03/17 0700 ?In: 3355.2 [I.V.:1470.2; Blood:345; JJ/HE:1740] ?Out: 2549 [Urine:5] ?Intake/Output this shift: ?No intake/output data recorded. ? ? ?Lab Results: ?Recent Labs  ?  01/28/22 ?8144 01/28/22 ?1552 2022/02/20 ?0541  ?HGB 6.9* 8.2* 8.6*  ?HCT 20.8* 25.0* 25.5*  ? ?BMET ?Recent Labs  ?  01/28/22 ?1552 02/20/22 ?0541  ?NA 134* 135  ?K 5.2* 5.3*  ?CL 102 104  ?CO2 20* 23  ?GLUCOSE 146* 134*  ?BUN 55* 46*  ?CREATININE 3.67* 2.62*  ?CALCIUM 6.9* 7.2*  ? ? ? ? ? ? ?Assessment/Plan: ?1) Left retroperitoneal hematoma possibly from renal source due to traumatic fall:  Hgb stabilizing now.  Would continue conservative management with serial H/H and transfusion as needed right now.  Any intervention including selective arterial embolization will carry a very high risk of serious renal dysfunction.  Will continue to follow.  His overall prognosis appears to remain very guarded. ?  ? ? LOS: 4 days  ? ?Les [name] [name] ?02/20/2022, 8:07 AM ? ?  ?

## 2022-02-13 DEATH — deceased

## 2022-02-22 ENCOUNTER — Other Ambulatory Visit: Payer: Self-pay | Admitting: Internal Medicine

## 2022-02-22 DIAGNOSIS — G4700 Insomnia, unspecified: Secondary | ICD-10-CM

## 2022-04-15 NOTE — Progress Notes (Signed)
   Progress Note   Date: 04/07/2022  Patient Name: [name] [name]        MRN#: [id number]  Eliquis contributed to Hematoma   Eliquis enhanced the left renal hemorrhage

## 2022-04-15 NOTE — Progress Notes (Signed)
  Progress Note   Date: 04/07/2022  Patient Name: [name] [name]        MRN#: [id number]   Clarification of diagnosis:   Acute respiratory failure  hypoxic

## 2022-05-04 ENCOUNTER — Ambulatory Visit: Payer: Medicare HMO | Admitting: Internal Medicine

## 2022-09-12 IMAGING — CR DG CHEST 2V
2 series · 2 of 2 positions shown · non-contrast
Comparison: 09/29/2020

CLINICAL DATA: Follow-up left pleural effusion

EXAM:
CHEST - 2 VIEW

[w chest pa]
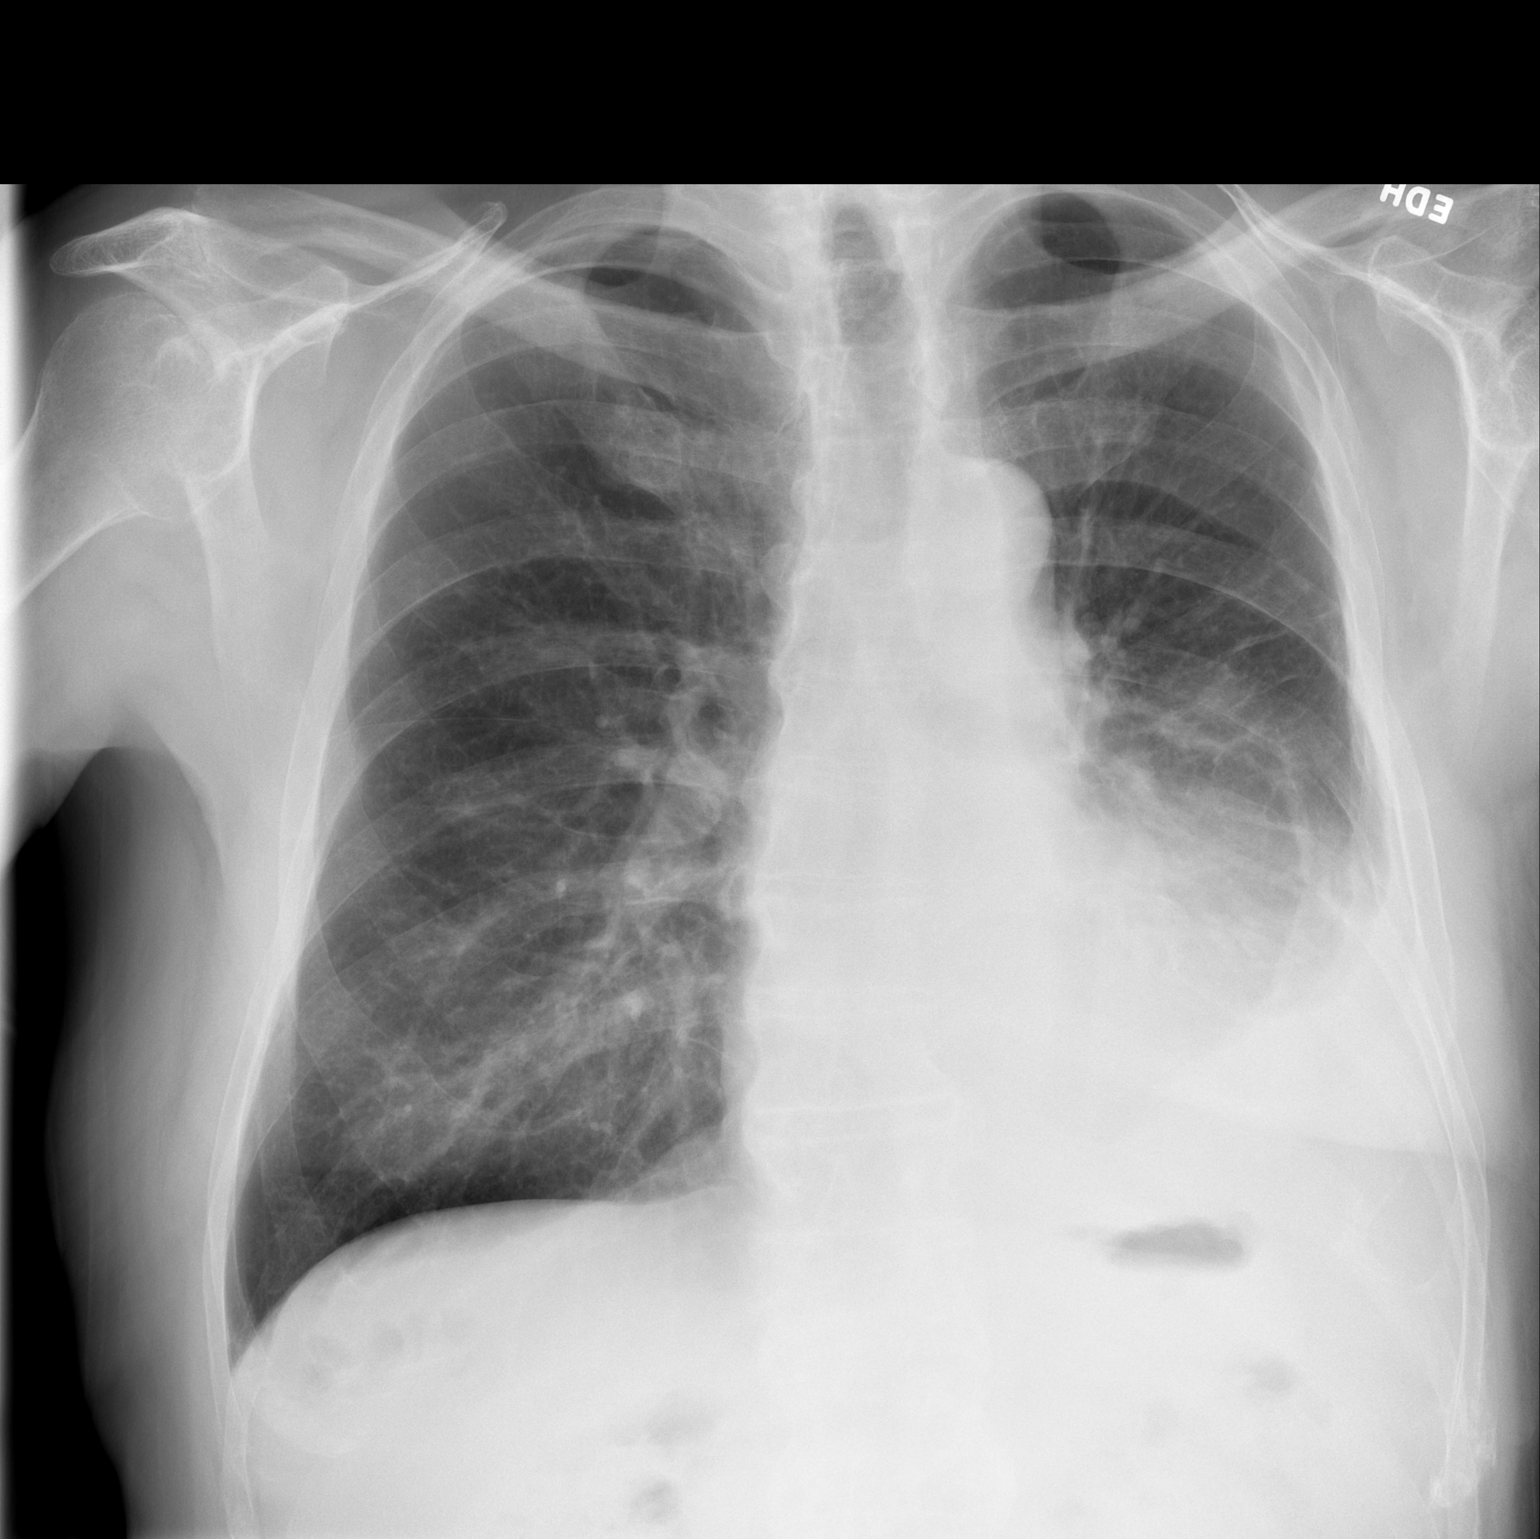

[w chest lat]
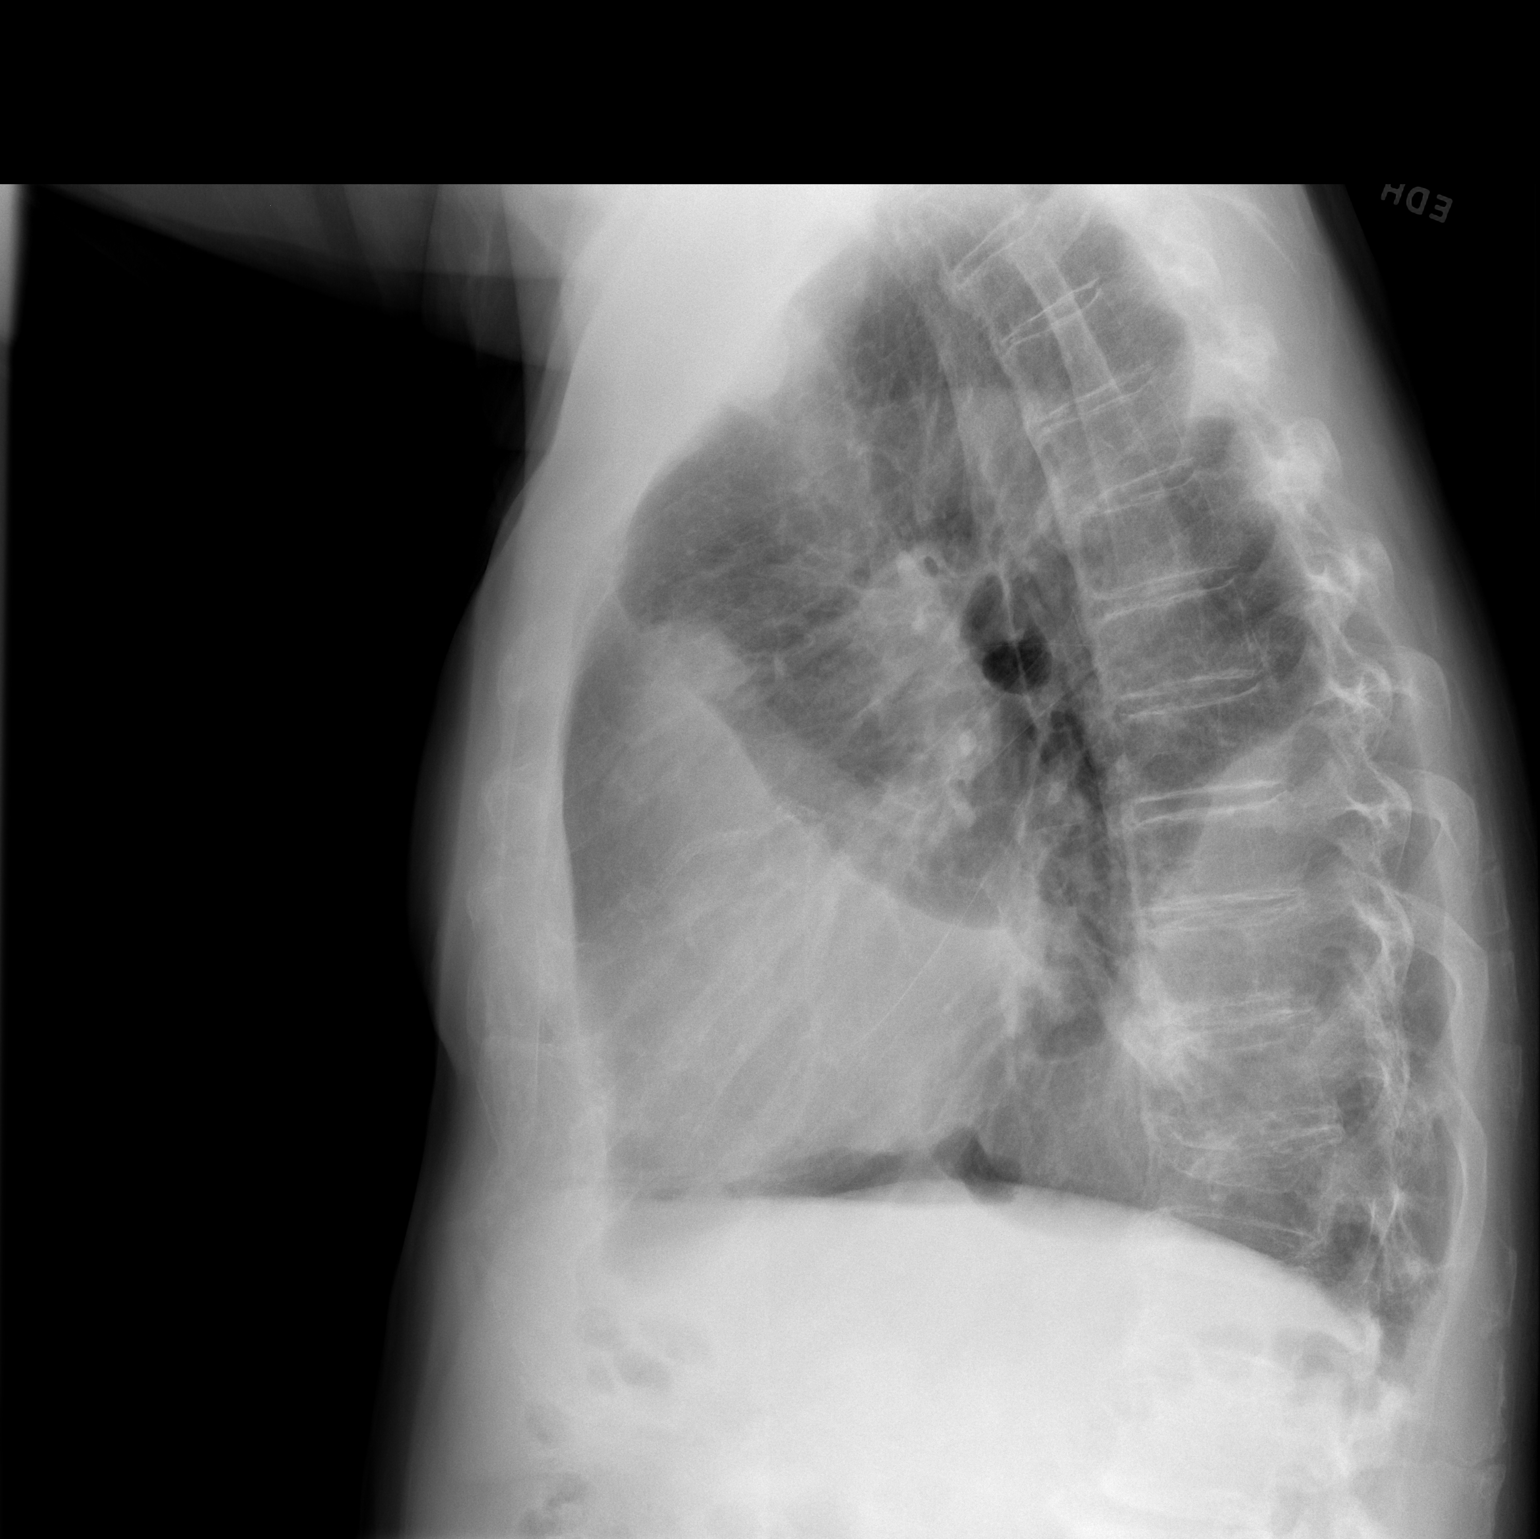

[2 of 2 positions shown; findings below may reference images not displayed]

FINDINGS: Mild improvement in left pleural effusion and left lower lobe
airspace disease.

Right lung remains clear. Negative for heart failure. Left coronary
stent.

Compression fracture T12 unchanged.
IMPRESSION: Mild improvement in left pleural effusion and left lower lobe
airspace disease.
